# Patient Record
Sex: Female | Born: 1975
Health system: Southern US, Community
[De-identification: ages and names within clinical notes are randomized; demographics above are authoritative.]

## PROBLEM LIST (undated history)

## (undated) DIAGNOSIS — F419 Anxiety disorder, unspecified: Secondary | ICD-10-CM

## (undated) DIAGNOSIS — M9420 Chondromalacia, unspecified site: Secondary | ICD-10-CM

## (undated) DIAGNOSIS — L732 Hidradenitis suppurativa: Secondary | ICD-10-CM

## (undated) DIAGNOSIS — N949 Unspecified condition associated with female genital organs and menstrual cycle: Secondary | ICD-10-CM

## (undated) DIAGNOSIS — J449 Chronic obstructive pulmonary disease, unspecified: Secondary | ICD-10-CM

## (undated) DIAGNOSIS — G709 Myoneural disorder, unspecified: Secondary | ICD-10-CM

## (undated) DIAGNOSIS — E66811 Obesity, class 1: Secondary | ICD-10-CM

## (undated) DIAGNOSIS — R51 Headache: Secondary | ICD-10-CM

## (undated) DIAGNOSIS — D649 Anemia, unspecified: Secondary | ICD-10-CM

## (undated) DIAGNOSIS — E669 Obesity, unspecified: Secondary | ICD-10-CM

## (undated) DIAGNOSIS — I10 Essential (primary) hypertension: Secondary | ICD-10-CM

## (undated) DIAGNOSIS — K529 Noninfective gastroenteritis and colitis, unspecified: Secondary | ICD-10-CM

## (undated) DIAGNOSIS — L0591 Pilonidal cyst without abscess: Secondary | ICD-10-CM

## (undated) DIAGNOSIS — R112 Nausea with vomiting, unspecified: Secondary | ICD-10-CM

## (undated) DIAGNOSIS — L42 Pityriasis rosea: Secondary | ICD-10-CM

## (undated) HISTORY — DX: Myoneural disorder, unspecified: G70.9

## (undated) HISTORY — PX: PARTIAL HYSTERECTOMY: SHX80

## (undated) HISTORY — PX: FRACTURE SURGERY: SHX138

## (undated) HISTORY — PX: ABDOMINAL HYSTERECTOMY: SHX81

## (undated) HISTORY — PX: OVARIAN CYST REMOVAL: SHX89

## (undated) HISTORY — PX: OTHER SURGICAL HISTORY: SHX169

## (undated) HISTORY — DX: Chronic obstructive pulmonary disease, unspecified: J44.9

## (undated) HISTORY — DX: Anemia, unspecified: D64.9

## (undated) HISTORY — DX: Anxiety disorder, unspecified: F41.9

## (undated) HISTORY — PX: CYSTECTOMY: SUR359

---

## 1997-11-14 ENCOUNTER — Emergency Department (HOSPITAL_COMMUNITY): Admission: EM | Admit: 1997-11-14 | Discharge: 1997-11-14 | Payer: Self-pay | Admitting: Emergency Medicine

## 1997-11-22 ENCOUNTER — Emergency Department (HOSPITAL_COMMUNITY): Admission: EM | Admit: 1997-11-22 | Discharge: 1997-11-22 | Payer: Self-pay | Admitting: Emergency Medicine

## 1997-12-23 ENCOUNTER — Emergency Department (HOSPITAL_COMMUNITY): Admission: EM | Admit: 1997-12-23 | Discharge: 1997-12-23 | Payer: Self-pay | Admitting: Emergency Medicine

## 1998-02-03 ENCOUNTER — Emergency Department (HOSPITAL_COMMUNITY): Admission: EM | Admit: 1998-02-03 | Discharge: 1998-02-03 | Payer: Self-pay | Admitting: Emergency Medicine

## 1998-03-14 ENCOUNTER — Emergency Department (HOSPITAL_COMMUNITY): Admission: EM | Admit: 1998-03-14 | Discharge: 1998-03-14 | Payer: Self-pay | Admitting: Emergency Medicine

## 1998-03-14 ENCOUNTER — Encounter: Payer: Self-pay | Admitting: Emergency Medicine

## 1998-06-14 ENCOUNTER — Encounter: Payer: Self-pay | Admitting: Emergency Medicine

## 1998-06-14 ENCOUNTER — Emergency Department (HOSPITAL_COMMUNITY): Admission: EM | Admit: 1998-06-14 | Discharge: 1998-06-14 | Payer: Self-pay | Admitting: Emergency Medicine

## 1998-06-17 ENCOUNTER — Inpatient Hospital Stay (HOSPITAL_COMMUNITY): Admission: AD | Admit: 1998-06-17 | Discharge: 1998-06-17 | Payer: Self-pay | Admitting: *Deleted

## 1998-06-22 ENCOUNTER — Other Ambulatory Visit: Admission: RE | Admit: 1998-06-22 | Discharge: 1998-06-22 | Payer: Self-pay | Admitting: Obstetrics

## 1998-06-22 ENCOUNTER — Encounter: Admission: RE | Admit: 1998-06-22 | Discharge: 1998-06-22 | Payer: Self-pay | Admitting: Obstetrics

## 1998-07-03 ENCOUNTER — Inpatient Hospital Stay (HOSPITAL_COMMUNITY): Admission: AD | Admit: 1998-07-03 | Discharge: 1998-07-05 | Payer: Self-pay | Admitting: Obstetrics & Gynecology

## 1998-07-07 ENCOUNTER — Inpatient Hospital Stay (HOSPITAL_COMMUNITY): Admission: AD | Admit: 1998-07-07 | Discharge: 1998-07-07 | Payer: Self-pay | Admitting: Obstetrics & Gynecology

## 1998-07-11 ENCOUNTER — Encounter: Admission: RE | Admit: 1998-07-11 | Discharge: 1998-07-11 | Payer: Self-pay | Admitting: Obstetrics & Gynecology

## 1998-09-21 ENCOUNTER — Emergency Department (HOSPITAL_COMMUNITY): Admission: EM | Admit: 1998-09-21 | Discharge: 1998-09-21 | Payer: Self-pay | Admitting: Emergency Medicine

## 1998-09-25 ENCOUNTER — Emergency Department (HOSPITAL_COMMUNITY): Admission: EM | Admit: 1998-09-25 | Discharge: 1998-09-25 | Payer: Self-pay | Admitting: Emergency Medicine

## 1998-10-16 ENCOUNTER — Emergency Department (HOSPITAL_COMMUNITY): Admission: EM | Admit: 1998-10-16 | Discharge: 1998-10-16 | Payer: Self-pay

## 1998-10-19 ENCOUNTER — Inpatient Hospital Stay (HOSPITAL_COMMUNITY): Admission: AD | Admit: 1998-10-19 | Discharge: 1998-10-19 | Payer: Self-pay | Admitting: Obstetrics

## 1998-10-19 ENCOUNTER — Emergency Department (HOSPITAL_COMMUNITY): Admission: EM | Admit: 1998-10-19 | Discharge: 1998-10-19 | Payer: Self-pay | Admitting: Emergency Medicine

## 1998-11-13 ENCOUNTER — Encounter: Payer: Self-pay | Admitting: Emergency Medicine

## 1998-11-13 ENCOUNTER — Emergency Department (HOSPITAL_COMMUNITY): Admission: EM | Admit: 1998-11-13 | Discharge: 1998-11-13 | Payer: Self-pay | Admitting: Emergency Medicine

## 1998-11-27 ENCOUNTER — Emergency Department (HOSPITAL_COMMUNITY): Admission: EM | Admit: 1998-11-27 | Discharge: 1998-11-27 | Payer: Self-pay | Admitting: *Deleted

## 1999-01-24 ENCOUNTER — Inpatient Hospital Stay (HOSPITAL_COMMUNITY): Admission: AD | Admit: 1999-01-24 | Discharge: 1999-01-24 | Payer: Self-pay | Admitting: Obstetrics & Gynecology

## 1999-02-06 ENCOUNTER — Emergency Department (HOSPITAL_COMMUNITY): Admission: EM | Admit: 1999-02-06 | Discharge: 1999-02-06 | Payer: Self-pay | Admitting: *Deleted

## 1999-03-07 ENCOUNTER — Other Ambulatory Visit: Admission: RE | Admit: 1999-03-07 | Discharge: 1999-03-07 | Payer: Self-pay | Admitting: Obstetrics and Gynecology

## 1999-05-21 DIAGNOSIS — L732 Hidradenitis suppurativa: Secondary | ICD-10-CM

## 1999-05-21 HISTORY — DX: Hidradenitis suppurativa: L73.2

## 1999-08-29 ENCOUNTER — Inpatient Hospital Stay (HOSPITAL_COMMUNITY): Admission: AD | Admit: 1999-08-29 | Discharge: 1999-08-29 | Payer: Self-pay | Admitting: Obstetrics

## 1999-09-02 ENCOUNTER — Inpatient Hospital Stay (HOSPITAL_COMMUNITY): Admission: AD | Admit: 1999-09-02 | Discharge: 1999-09-03 | Payer: Self-pay | Admitting: Obstetrics

## 1999-09-05 ENCOUNTER — Inpatient Hospital Stay (HOSPITAL_COMMUNITY): Admission: AD | Admit: 1999-09-05 | Discharge: 1999-09-08 | Payer: Self-pay | Admitting: Obstetrics

## 1999-10-02 ENCOUNTER — Emergency Department (HOSPITAL_COMMUNITY): Admission: EM | Admit: 1999-10-02 | Discharge: 1999-10-02 | Payer: Self-pay | Admitting: Emergency Medicine

## 1999-10-18 ENCOUNTER — Encounter (INDEPENDENT_AMBULATORY_CARE_PROVIDER_SITE_OTHER): Payer: Self-pay | Admitting: *Deleted

## 1999-10-18 ENCOUNTER — Ambulatory Visit (HOSPITAL_BASED_OUTPATIENT_CLINIC_OR_DEPARTMENT_OTHER): Admission: RE | Admit: 1999-10-18 | Discharge: 1999-10-19 | Payer: Self-pay | Admitting: General Surgery

## 2000-05-20 HISTORY — PX: LAPAROSCOPIC CHOLECYSTECTOMY: SUR755

## 2000-08-04 ENCOUNTER — Inpatient Hospital Stay (HOSPITAL_COMMUNITY): Admission: AD | Admit: 2000-08-04 | Discharge: 2000-08-06 | Payer: Self-pay | Admitting: Obstetrics & Gynecology

## 2000-11-16 ENCOUNTER — Inpatient Hospital Stay (HOSPITAL_COMMUNITY): Admission: EM | Admit: 2000-11-16 | Discharge: 2000-11-17 | Payer: Self-pay | Admitting: Emergency Medicine

## 2000-11-16 ENCOUNTER — Encounter (INDEPENDENT_AMBULATORY_CARE_PROVIDER_SITE_OTHER): Payer: Self-pay

## 2000-11-16 ENCOUNTER — Encounter: Payer: Self-pay | Admitting: Emergency Medicine

## 2001-09-15 ENCOUNTER — Encounter (INDEPENDENT_AMBULATORY_CARE_PROVIDER_SITE_OTHER): Payer: Self-pay | Admitting: *Deleted

## 2001-09-15 ENCOUNTER — Ambulatory Visit (HOSPITAL_BASED_OUTPATIENT_CLINIC_OR_DEPARTMENT_OTHER): Admission: RE | Admit: 2001-09-15 | Discharge: 2001-09-15 | Payer: Self-pay

## 2002-07-12 ENCOUNTER — Encounter: Admission: RE | Admit: 2002-07-12 | Discharge: 2002-07-12 | Payer: Self-pay | Admitting: Family Medicine

## 2002-07-12 ENCOUNTER — Encounter: Payer: Self-pay | Admitting: Family Medicine

## 2002-08-30 ENCOUNTER — Ambulatory Visit (HOSPITAL_COMMUNITY): Admission: RE | Admit: 2002-08-30 | Discharge: 2002-08-30 | Payer: Self-pay | Admitting: General Surgery

## 2002-08-30 ENCOUNTER — Encounter (INDEPENDENT_AMBULATORY_CARE_PROVIDER_SITE_OTHER): Payer: Self-pay | Admitting: Specialist

## 2002-10-02 ENCOUNTER — Encounter: Payer: Self-pay | Admitting: Emergency Medicine

## 2002-10-02 ENCOUNTER — Emergency Department (HOSPITAL_COMMUNITY): Admission: EM | Admit: 2002-10-02 | Discharge: 2002-10-02 | Payer: Self-pay | Admitting: Emergency Medicine

## 2002-10-03 ENCOUNTER — Inpatient Hospital Stay (HOSPITAL_COMMUNITY): Admission: AD | Admit: 2002-10-03 | Discharge: 2002-10-03 | Payer: Self-pay | Admitting: Obstetrics and Gynecology

## 2002-10-05 ENCOUNTER — Inpatient Hospital Stay (HOSPITAL_COMMUNITY): Admission: AD | Admit: 2002-10-05 | Discharge: 2002-10-05 | Payer: Self-pay | Admitting: *Deleted

## 2002-12-15 ENCOUNTER — Encounter: Payer: Self-pay | Admitting: Emergency Medicine

## 2002-12-15 ENCOUNTER — Emergency Department (HOSPITAL_COMMUNITY): Admission: EM | Admit: 2002-12-15 | Discharge: 2002-12-15 | Payer: Self-pay | Admitting: Emergency Medicine

## 2003-05-21 DIAGNOSIS — M9420 Chondromalacia, unspecified site: Secondary | ICD-10-CM

## 2003-05-21 HISTORY — DX: Chondromalacia, unspecified site: M94.20

## 2003-05-21 HISTORY — PX: KNEE SURGERY: SHX244

## 2003-07-15 ENCOUNTER — Ambulatory Visit (HOSPITAL_COMMUNITY): Admission: RE | Admit: 2003-07-15 | Discharge: 2003-07-15 | Payer: Self-pay | Admitting: Orthopedic Surgery

## 2003-07-26 ENCOUNTER — Inpatient Hospital Stay (HOSPITAL_COMMUNITY): Admission: EM | Admit: 2003-07-26 | Discharge: 2003-07-27 | Payer: Self-pay | Admitting: Emergency Medicine

## 2003-08-15 ENCOUNTER — Encounter: Admission: RE | Admit: 2003-08-15 | Discharge: 2003-11-13 | Payer: Self-pay | Admitting: Orthopedic Surgery

## 2003-10-25 ENCOUNTER — Ambulatory Visit (HOSPITAL_COMMUNITY): Admission: RE | Admit: 2003-10-25 | Discharge: 2003-10-26 | Payer: Self-pay | Admitting: Orthopedic Surgery

## 2004-07-14 ENCOUNTER — Emergency Department (HOSPITAL_COMMUNITY): Admission: EM | Admit: 2004-07-14 | Discharge: 2004-07-14 | Payer: Self-pay | Admitting: Emergency Medicine

## 2004-10-09 ENCOUNTER — Other Ambulatory Visit: Admission: RE | Admit: 2004-10-09 | Discharge: 2004-10-09 | Payer: Self-pay | Admitting: Obstetrics and Gynecology

## 2005-04-04 ENCOUNTER — Encounter: Admission: RE | Admit: 2005-04-04 | Discharge: 2005-04-04 | Payer: Self-pay | Admitting: Family Medicine

## 2005-04-10 ENCOUNTER — Encounter: Admission: RE | Admit: 2005-04-10 | Discharge: 2005-04-10 | Payer: Self-pay | Admitting: Family Medicine

## 2005-04-26 ENCOUNTER — Emergency Department (HOSPITAL_COMMUNITY): Admission: EM | Admit: 2005-04-26 | Discharge: 2005-04-26 | Payer: Self-pay | Admitting: Emergency Medicine

## 2005-05-04 ENCOUNTER — Encounter: Admission: RE | Admit: 2005-05-04 | Discharge: 2005-05-04 | Payer: Self-pay | Admitting: Ophthalmology

## 2005-05-05 ENCOUNTER — Encounter: Admission: RE | Admit: 2005-05-05 | Discharge: 2005-05-05 | Payer: Self-pay | Admitting: Neurology

## 2005-07-02 ENCOUNTER — Encounter: Admission: RE | Admit: 2005-07-02 | Discharge: 2005-07-02 | Payer: Self-pay | Admitting: Family Medicine

## 2005-07-02 ENCOUNTER — Ambulatory Visit (HOSPITAL_COMMUNITY): Admission: RE | Admit: 2005-07-02 | Discharge: 2005-07-02 | Payer: Self-pay | Admitting: *Deleted

## 2005-08-09 ENCOUNTER — Emergency Department (HOSPITAL_COMMUNITY): Admission: EM | Admit: 2005-08-09 | Discharge: 2005-08-09 | Payer: Self-pay | Admitting: Emergency Medicine

## 2006-04-29 ENCOUNTER — Encounter: Admission: RE | Admit: 2006-04-29 | Discharge: 2006-04-29 | Payer: Self-pay | Admitting: Neurosurgery

## 2006-06-06 ENCOUNTER — Encounter: Admission: RE | Admit: 2006-06-06 | Discharge: 2006-06-06 | Payer: Self-pay | Admitting: Neurosurgery

## 2007-01-24 ENCOUNTER — Emergency Department (HOSPITAL_COMMUNITY): Admission: EM | Admit: 2007-01-24 | Discharge: 2007-01-24 | Payer: Self-pay | Admitting: Emergency Medicine

## 2007-07-06 ENCOUNTER — Emergency Department (HOSPITAL_COMMUNITY): Admission: EM | Admit: 2007-07-06 | Discharge: 2007-07-06 | Payer: Self-pay | Admitting: Emergency Medicine

## 2007-10-09 ENCOUNTER — Emergency Department (HOSPITAL_COMMUNITY): Admission: EM | Admit: 2007-10-09 | Discharge: 2007-10-09 | Payer: Self-pay | Admitting: Emergency Medicine

## 2008-02-02 ENCOUNTER — Emergency Department (HOSPITAL_COMMUNITY): Admission: EM | Admit: 2008-02-02 | Discharge: 2008-02-03 | Payer: Self-pay | Admitting: Emergency Medicine

## 2008-03-16 ENCOUNTER — Emergency Department (HOSPITAL_COMMUNITY): Admission: EM | Admit: 2008-03-16 | Discharge: 2008-03-16 | Payer: Self-pay | Admitting: Emergency Medicine

## 2008-05-04 ENCOUNTER — Emergency Department (HOSPITAL_COMMUNITY): Admission: EM | Admit: 2008-05-04 | Discharge: 2008-05-04 | Payer: Self-pay | Admitting: Emergency Medicine

## 2008-11-30 ENCOUNTER — Emergency Department (HOSPITAL_COMMUNITY): Admission: EM | Admit: 2008-11-30 | Discharge: 2008-11-30 | Payer: Self-pay | Admitting: Emergency Medicine

## 2009-01-17 ENCOUNTER — Emergency Department (HOSPITAL_COMMUNITY): Admission: EM | Admit: 2009-01-17 | Discharge: 2009-01-18 | Payer: Self-pay | Admitting: Emergency Medicine

## 2009-03-23 ENCOUNTER — Emergency Department (HOSPITAL_COMMUNITY): Admission: EM | Admit: 2009-03-23 | Discharge: 2009-03-23 | Payer: Self-pay | Admitting: Emergency Medicine

## 2009-04-03 ENCOUNTER — Ambulatory Visit: Payer: Self-pay | Admitting: Internal Medicine

## 2009-04-25 ENCOUNTER — Emergency Department (HOSPITAL_COMMUNITY): Admission: EM | Admit: 2009-04-25 | Discharge: 2009-04-25 | Payer: Self-pay | Admitting: Emergency Medicine

## 2009-05-20 HISTORY — PX: GASTRIC BYPASS: SHX52

## 2009-07-27 ENCOUNTER — Emergency Department (HOSPITAL_COMMUNITY): Admission: EM | Admit: 2009-07-27 | Discharge: 2009-07-27 | Payer: Self-pay | Admitting: Family Medicine

## 2009-08-31 ENCOUNTER — Emergency Department (HOSPITAL_COMMUNITY): Admission: EM | Admit: 2009-08-31 | Discharge: 2009-09-01 | Payer: Self-pay | Admitting: Emergency Medicine

## 2009-09-01 ENCOUNTER — Emergency Department (HOSPITAL_COMMUNITY): Admission: EM | Admit: 2009-09-01 | Discharge: 2009-09-01 | Payer: Self-pay | Admitting: Emergency Medicine

## 2009-10-17 ENCOUNTER — Emergency Department (HOSPITAL_COMMUNITY): Admission: EM | Admit: 2009-10-17 | Discharge: 2009-10-17 | Payer: Self-pay | Admitting: Emergency Medicine

## 2010-08-06 LAB — COMPREHENSIVE METABOLIC PANEL
BUN: 4 mg/dL — ABNORMAL LOW (ref 6–23)
Calcium: 9 mg/dL (ref 8.4–10.5)
Glucose, Bld: 86 mg/dL (ref 70–99)
Sodium: 144 mEq/L (ref 135–145)
Total Protein: 7.1 g/dL (ref 6.0–8.3)

## 2010-08-06 LAB — URINALYSIS, ROUTINE W REFLEX MICROSCOPIC
Hgb urine dipstick: NEGATIVE
Ketones, ur: 15 mg/dL — AB
Protein, ur: 30 mg/dL — AB
Specific Gravity, Urine: 1.037 — ABNORMAL HIGH (ref 1.005–1.030)
Urobilinogen, UA: 1 mg/dL (ref 0.0–1.0)

## 2010-08-06 LAB — DIFFERENTIAL
Lymphocytes Relative: 29 % (ref 12–46)
Lymphs Abs: 2.3 10*3/uL (ref 0.7–4.0)
Monocytes Relative: 5 % (ref 3–12)
Neutro Abs: 5.1 10*3/uL (ref 1.7–7.7)
Neutrophils Relative %: 65 % (ref 43–77)

## 2010-08-06 LAB — CBC
HCT: 46.1 % — ABNORMAL HIGH (ref 36.0–46.0)
Hemoglobin: 15.2 g/dL — ABNORMAL HIGH (ref 12.0–15.0)
MCHC: 32.9 g/dL (ref 30.0–36.0)
RDW: 14.2 % (ref 11.5–15.5)

## 2010-08-06 LAB — URINE CULTURE: Culture: NO GROWTH

## 2010-08-06 LAB — URINE MICROSCOPIC-ADD ON

## 2010-08-07 LAB — WOUND CULTURE
Culture: NO GROWTH
Gram Stain: NONE SEEN

## 2010-08-08 LAB — CBC
Platelets: 324 10*3/uL (ref 150–400)
RDW: 13.3 % (ref 11.5–15.5)

## 2010-08-08 LAB — DIFFERENTIAL
Basophils Relative: 0 % (ref 0–1)
Lymphs Abs: 2.4 10*3/uL (ref 0.7–4.0)
Monocytes Absolute: 0.5 10*3/uL (ref 0.1–1.0)
Monocytes Relative: 6 % (ref 3–12)
Neutro Abs: 6 10*3/uL (ref 1.7–7.7)

## 2010-08-08 LAB — COMPREHENSIVE METABOLIC PANEL
ALT: 34 U/L (ref 0–35)
Albumin: 3.2 g/dL — ABNORMAL LOW (ref 3.5–5.2)
Alkaline Phosphatase: 93 U/L (ref 39–117)
BUN: 7 mg/dL (ref 6–23)
Calcium: 8.7 mg/dL (ref 8.4–10.5)
Potassium: 3.3 mEq/L — ABNORMAL LOW (ref 3.5–5.1)
Sodium: 141 mEq/L (ref 135–145)
Total Protein: 6.8 g/dL (ref 6.0–8.3)

## 2010-08-21 LAB — POCT URINALYSIS DIP (DEVICE)
Bilirubin Urine: NEGATIVE
Nitrite: NEGATIVE
Specific Gravity, Urine: 1.015 (ref 1.005–1.030)
pH: 7 (ref 5.0–8.0)

## 2010-08-22 LAB — CBC
HCT: 45 % (ref 36.0–46.0)
Hemoglobin: 15.5 g/dL — ABNORMAL HIGH (ref 12.0–15.0)
MCHC: 34.4 g/dL (ref 30.0–36.0)
MCV: 95.4 fL (ref 78.0–100.0)
RBC: 4.72 MIL/uL (ref 3.87–5.11)
RDW: 13.6 % (ref 11.5–15.5)

## 2010-08-22 LAB — POCT I-STAT, CHEM 8
Calcium, Ion: 1.09 mmol/L — ABNORMAL LOW (ref 1.12–1.32)
Creatinine, Ser: 0.7 mg/dL (ref 0.4–1.2)
Glucose, Bld: 92 mg/dL (ref 70–99)
HCT: 49 % — ABNORMAL HIGH (ref 36.0–46.0)
Hemoglobin: 16.7 g/dL — ABNORMAL HIGH (ref 12.0–15.0)

## 2010-08-22 LAB — DIFFERENTIAL
Basophils Absolute: 0.1 10*3/uL (ref 0.0–0.1)
Basophils Relative: 2 % — ABNORMAL HIGH (ref 0–1)
Eosinophils Absolute: 0.1 10*3/uL (ref 0.0–0.7)
Eosinophils Relative: 2 % (ref 0–5)
Monocytes Absolute: 0.3 10*3/uL (ref 0.1–1.0)
Monocytes Relative: 4 % (ref 3–12)

## 2010-08-22 LAB — POCT CARDIAC MARKERS
CKMB, poc: <1 ng/mL — ABNORMAL LOW (ref 1.0–8.0)
CKMB, poc: <1 ng/mL — ABNORMAL LOW (ref 1.0–8.0)
Myoglobin, poc: 35.2 ng/mL (ref 12–200)
Troponin i, poc: <0.05 ng/mL (ref 0.00–0.09)

## 2010-08-24 LAB — POCT CARDIAC MARKERS: Myoglobin, poc: 59.1 ng/mL (ref 12–200)

## 2010-08-26 LAB — POCT CARDIAC MARKERS
CKMB, poc: <1 ng/mL — ABNORMAL LOW (ref 1.0–8.0)
Myoglobin, poc: 72.9 ng/mL (ref 12–200)
Troponin i, poc: <0.05 ng/mL (ref 0.00–0.09)

## 2010-08-26 LAB — COMPREHENSIVE METABOLIC PANEL
ALT: 34 U/L (ref 0–35)
BUN: 6 mg/dL (ref 6–23)
Calcium: 8.9 mg/dL (ref 8.4–10.5)
Creatinine, Ser: 0.81 mg/dL (ref 0.4–1.2)
Glucose, Bld: 93 mg/dL (ref 70–99)
Sodium: 142 mEq/L (ref 135–145)
Total Protein: 6.3 g/dL (ref 6.0–8.3)

## 2010-08-26 LAB — DIFFERENTIAL
Lymphocytes Relative: 42 % (ref 12–46)
Lymphs Abs: 2.5 10*3/uL (ref 0.7–4.0)
Monocytes Relative: 4 % (ref 3–12)
Neutro Abs: 3 10*3/uL (ref 1.7–7.7)
Neutrophils Relative %: 51 % (ref 43–77)

## 2010-08-26 LAB — CBC
Hemoglobin: 14.9 g/dL (ref 12.0–15.0)
MCHC: 33.7 g/dL (ref 30.0–36.0)
RDW: 13.1 % (ref 11.5–15.5)

## 2010-09-27 ENCOUNTER — Emergency Department (HOSPITAL_BASED_OUTPATIENT_CLINIC_OR_DEPARTMENT_OTHER)
Admission: EM | Admit: 2010-09-27 | Discharge: 2010-09-27 | Disposition: A | Discharge status: Home / Self Care | Payer: Medicaid Other | Attending: Emergency Medicine | Admitting: Emergency Medicine

## 2010-09-27 DIAGNOSIS — L0231 Cutaneous abscess of buttock: Secondary | ICD-10-CM | POA: Insufficient documentation

## 2010-09-27 DIAGNOSIS — L03317 Cellulitis of buttock: Secondary | ICD-10-CM | POA: Insufficient documentation

## 2010-09-27 DIAGNOSIS — I10 Essential (primary) hypertension: Secondary | ICD-10-CM | POA: Insufficient documentation

## 2010-09-29 ENCOUNTER — Emergency Department (HOSPITAL_BASED_OUTPATIENT_CLINIC_OR_DEPARTMENT_OTHER)
Admission: EM | Admit: 2010-09-29 | Discharge: 2010-09-29 | Disposition: A | Discharge status: Home / Self Care | Payer: Medicaid Other | Attending: Emergency Medicine | Admitting: Emergency Medicine

## 2010-09-29 ENCOUNTER — Emergency Department (INDEPENDENT_AMBULATORY_CARE_PROVIDER_SITE_OTHER): Payer: Medicaid Other

## 2010-09-29 ENCOUNTER — Emergency Department (HOSPITAL_BASED_OUTPATIENT_CLINIC_OR_DEPARTMENT_OTHER): Payer: Medicaid Other

## 2010-09-29 DIAGNOSIS — L0231 Cutaneous abscess of buttock: Secondary | ICD-10-CM

## 2010-09-29 DIAGNOSIS — I10 Essential (primary) hypertension: Secondary | ICD-10-CM | POA: Insufficient documentation

## 2010-09-29 DIAGNOSIS — Z79899 Other long term (current) drug therapy: Secondary | ICD-10-CM | POA: Insufficient documentation

## 2010-09-29 DIAGNOSIS — L03317 Cellulitis of buttock: Secondary | ICD-10-CM

## 2010-09-29 LAB — CBC
HCT: 39.8 % (ref 36.0–46.0)
Hemoglobin: 13.6 g/dL (ref 12.0–15.0)
MCHC: 34.2 g/dL (ref 30.0–36.0)
MCV: 95 fL (ref 78.0–100.0)
RDW: 12.9 % (ref 11.5–15.5)
WBC: 5.8 10*3/uL (ref 4.0–10.5)

## 2010-09-29 LAB — DIFFERENTIAL
Eosinophils Relative: 2 % (ref 0–5)
Lymphocytes Relative: 33 % (ref 12–46)
Lymphs Abs: 1.9 10*3/uL (ref 0.7–4.0)
Monocytes Absolute: 0.4 10*3/uL (ref 0.1–1.0)
Neutro Abs: 3.4 10*3/uL (ref 1.7–7.7)

## 2010-09-29 LAB — BASIC METABOLIC PANEL
BUN: 8 mg/dL (ref 6–23)
CO2: 26 mEq/L (ref 19–32)
GFR calc non Af Amer: >60 mL/min (ref >60)
Glucose, Bld: 107 mg/dL — ABNORMAL HIGH (ref 70–99)
Potassium: 3.4 mEq/L — ABNORMAL LOW (ref 3.5–5.1)

## 2010-09-29 MED ORDER — IOHEXOL 300 MG/ML  SOLN
100.0000 mL | Freq: Once | INTRAMUSCULAR | Status: AC | PRN
  Administered 2010-09-29: 100 mL via INTRAVENOUS

## 2010-09-30 ENCOUNTER — Emergency Department (HOSPITAL_BASED_OUTPATIENT_CLINIC_OR_DEPARTMENT_OTHER)
Admission: EM | Admit: 2010-09-30 | Discharge: 2010-09-30 | Disposition: A | Discharge status: Home / Self Care | Payer: Medicaid Other | Attending: Emergency Medicine | Admitting: Emergency Medicine

## 2010-09-30 DIAGNOSIS — Z09 Encounter for follow-up examination after completed treatment for conditions other than malignant neoplasm: Secondary | ICD-10-CM | POA: Insufficient documentation

## 2010-09-30 DIAGNOSIS — F17200 Nicotine dependence, unspecified, uncomplicated: Secondary | ICD-10-CM | POA: Insufficient documentation

## 2010-09-30 DIAGNOSIS — I10 Essential (primary) hypertension: Secondary | ICD-10-CM | POA: Insufficient documentation

## 2010-10-01 LAB — CULTURE, ROUTINE-ABSCESS: Gram Stain: NONE SEEN

## 2010-10-05 NOTE — Op Note (Signed)
[place]. [facility]  Patient:    [name], [name]                       MRN: [id number] Proc. Date: 10/18/99 Adm. Date:  [id number] Disc. Date: [id number] Attending:  Henrene Dodge CC:         Anselm Pancoast. Zachery Dakins, M.D.                           Operative Report  PREOPERATIVE DIAGNOSES: 1. Recurrent pilonidal abscess/cyst. 2. Multiple abscesses, hidradenitis, both axilla.  POSTOPERATIVE DIAGNOSES: 1. Recurrent pilonidal abscess/cyst. 2. Multiple abscesses, hidradenitis, both axilla.  PROCEDURE: 1. Excision of pilonidal with closure. 2. Excision of small area of hidradenitis, right axilla. 3. Multiple areas of hidradenitis excised, left axilla.  ANESTHESIA:  General  SURGEON:  Anselm Pancoast. Zachery Dakins, M.D.  HISTORY:  The patient is a 35 year old quite large black female whom I first met approximately 3 months ago when she was pregnant.  She had the largest pilonidal cyst that I have ever drained.  This was drained with local anesthesia, and she  went onto deliver approximately a month later.  I next saw her approximately a month later, when she presented to the Emergency Room at [facility] with a large abscess in the right axilla. This was drained with local anesthesia.  She said t had been drained approximately 10 times previously and I saw her back in the office approximately a week ago, and scheduled for excision of the pilonidal cyst, reexcision of drainage of this area in the right axilla. She has multiple, somewhat smaller abscesses in the left axilla at this time.  We are planning to hopefully excise the pilonidal, excise this reoccurrent abscess in the right axilla and then sort of drain/excisional debridement of the smaller multiple abscesses in the left axilla.  DESCRIPTION OF PROCEDURE:  Prone position, switched into a supine position.  The patient was taken to the operative suite, given a g of Kefzol after induction of  general anesthesia and then switched over a prone position.  Arms and chest padded.  After a good airway, and she was comfortable, we then taped the buttocks apart and then prepped the area with Betadine surgical scrub and solution and draped her in a sterile manner.  The pilonidal area excised was probably 6-7 inches in length, developing the superior, inferolateral flaps and then the whole scar  complex cyst area completely excised. She is so large that it was easy to kind f undermine the buttocks so that you could pull it together with 3-0 Vicryl. I put a 10 mm Jackson-Pratt drain in the base of the wound. The skin itself was closed ith a combination of 2 and 3-0 nylon simple sutures, and mattress sutures.  Next, after sterile occlusive dressing had been applied, we switched her over onto a second OR table in a supine position and then the area was completely redraped sterilely, new drape, gloves, etc (everything.)  Then this area of the right, which the skin incision was about 4 cm in size, excised in an egg-sized chronic abscess. Good hemostasis was obtained with cautery and 3-0 chromic, and then this skin incision was closed with simple sutures of 3-0 nylon.  Next on the left axilla,  which there was about 4 abscesses (each about a nickel to a dime to a quarter in size), and I excised the abscesses and surrounding cyst contents through  multiple incisions, and then the incisions were loosely closed with 3-0 nylon and then packed with Iodoform gauze.  The patient tolerated the procedure.  Estimated blood loss totally was probably 100 cc.  She was extubated and taken to the recovery oom in a stable, postoperative condition. She will be released after a stay in the recovery room.  Hopefully, her husband will be helping her, as she will be rather limited over the next few days. I will see her back in the office early next week for wound check.  She was instructed to take the  packing out of the left axilla  probably Saturday evening. DD:  10/18/99 TD:  10/22/99 Job: [id number]
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
## 2010-10-05 NOTE — Op Note (Signed)
NAME:  [name], Erin A                        ACCOUNT NO.:  [id number]   MEDICAL RECORD NO.:  [id number]                   PATIENT TYPE:  AMB   LOCATION:  DAY                                  FACILITY:  [facility]   PHYSICIAN:  Anselm Pancoast. Zachery Dakins, M.D.          DATE OF BIRTH:  [birth year]   DATE OF PROCEDURE:  08/30/2002  DATE OF DISCHARGE:                                 OPERATIVE REPORT   PREOPERATIVE DIAGNOSIS:  Recurrent hidradenitis, left axilla.   OPERATION:  Excision of recurrent hidradenitis, left axilla.   ANESTHESIA:  General.   SURGEON:  Anselm Pancoast. Zachery Dakins, M.D.   HISTORY:  [name] is a 35 year old, overweight female, who has had a  history of hidradenitis.  I have operated on her numerous times with  hidradenitis in the axilla, sacral, and other areas.  She has had kind of a  nearly complete excision of the hidroa area in the right and has no further  problems on the left.  We just drained and ellipsed out the smaller areas,  and she has had recurrent problems with the left axilla, and she desires for  it to be managed as we did the right.  There is a large area of drainage.  I  saw her in the office last week, but she would not let me drain the area  because of the pain, and she is on Keflex.  She is not febrile, and her lab  studies, hemoglobin and hematocrit are normal.   DESCRIPTION OF PROCEDURE:  She was given 1 g of Kefzol intravenously, taken  to the operative suite, positioned on the OR table so that her breast was  kind of pulled to the right and inferior to expose the axilla easier, and  then the area was prepped with Betadine surgical scrub and solution and  draped in a sterile manner.  I made an ellipse of skin about 5 inches x  about 2.5 inches that encompasses all of the previously drained and the  large inflammatory mass, and then the medial flap was developed first.  The  skin edges were developed with skin hooks, and the underlying bleeders  and  lymphatics were clamped and ligated with Vicryl or lightly coagulated.  The  lateral arm area was next developed, and then the axillary contents.  This  was all superficial and not truly below the pectoral fascia, was basically  clamped with hemostats and then ligated.  The wound was carefully inspected.  At no time did we open into any frank pockets of purulence, and I then place  a 10 Jackson-Pratt drain into the axilla, and this was sutured to the skin  with 3-0 nylon.  Next, the subcutaneous wounds were closed with 4-0 Vicryl,  and then the skin was closed with mattress sutures of 3-0 nylon with  automatic staples.  The patient tolerated the procedure nicely, and the  occlusive dressing was applied, first  with Vaseline gauze and then Vaseline  gauze around the drain.  The dressing was held in place with Hypafix, and  she will be released after a short stay in the recovery room.                                               Anselm Pancoast. Zachery Dakins, M.D.    WJW/MEDQ  D:  08/30/2002  T:  08/30/2002  Job:  [id number]

## 2010-10-05 NOTE — Op Note (Signed)
[place]. [facility]  Patient:    [name], [name] Visit Number: [id number] MRN: [id number]          Service Type: DSU Location: [facility] Attending Physician:  Henrene Dodge Dictated by:   Anselm Pancoast. Zachery Dakins, M.D. Proc. Date: 09/15/01 Admit Date:  09/15/2001 Discharge Date: 09/15/2001                             Operative Report  PREOPERATIVE DIAGNOSIS:  Recurrent chronic and acute hidradenitis of right axilla.  POSTOPERATIVE DIAGNOSIS:  Recurrent chronic and acute hidradenitis of right axilla.  OPERATION:  Excision and closure with drainage hidradenitis, right axilla, large superficial axillary dissection.  SURGEON:  Anselm Pancoast. Zachery Dakins, M.D.  INDICATIONS:  The patient is a 35 year old black female about 300+ pounds who I first met approximately four years ago when she presented with a large abscess of the right axilla secondary to hidradenitis.  This was drained and excised.  She has had a large pilonidal hidradenitis on the left axilla and has done well with the exception of the right axilla that she had intermittent pain and swelling, and has been on antibiotics now for approximately three weeks and I saw her yesterday.  She had a low grade temperature of a large big tender area, but was not really localized so it could be drained superficially in the office and I recommended that we excise this in the chronic areas in the operating room and hopefully could close it or pack it.  The patient was in agreement and she continues on the p.o. Keflex.  On examination this morning, there is an area about 4 x 6 inches in size that is just very tender, just a large mass in the chronic subcutaneous for a couple little areas that looked as if it possibly a good point, and we gave her 1 g of Kefzol, and took her to the operating room.  DESCRIPTION OF PROCEDURE:  Induction of general anesthesia with endotracheal tube.  The axilla was then prepped with  Betadine surgical scrubbing solution after shaving the axilla.  I marked a skin edge so that the chronic area where it had been at previously excised inferiorly would be excised within the skin area and then there was a larger area kind of down at the top part of the arm. I did use an 18-gauge needle and aspirated two areas and entered a pocket of chronic infection of which I removed probably 5 cc or 6 cc of frank pus that was sent for culture.  Next, after this ellipse of skin was marked, and the area excised was probably about 5 inches in length, about three or more inches wide so that nearly most of the hair-bearing area of the axilla will be excised.  I then developed the lateral flap first, elevating this first with double hooks and then the breast hook, and then afterward we were going around all of the acute infected superiorly and inferiorly, all in a similar manner.  Several little bleeders were encountered and required suturing with 4-0 Vicryl.  Most of them were just small enough they could be cauterized, and then after the area was freed circumferentially, sort of pulled it out of the axilla, and then along the superficial Scarpas fascia area and just basically removed everything.  It only went one area kind of deeper into the axilla so that all the gross inflammatory masses and all were excised, and  then this large specimen was sent for permanent examination.  I had to suture several little veins that were deeper, but these were all small, and did not actually open into the true deep axilla, and since there was no frank pus in the field, I think that we can close it with a Blake drain.  A 19 Blake drain was placed and brought out laterally and inferiorly, and then this sutured to the skin with 2-0 and then I closed the subcutaneous tissue with interrupted 4-0 Vicryl, and then closed it with staples.  We were able to put Vaseline gauze over the actual skin and then could get a  good suction seal after we had closed the actual skin incision with wide staples.  I will continue her on the Keflex and await the results of the culture, and plan on seeing her in the office on Thursday afternoon.  Hopefully, we will just be able to continue with the suction drainage and not have to open any areas. Dictated by:   Anselm Pancoast. Zachery Dakins, M.D. Attending Physician:  Henrene Dodge DD:  09/15/01 TD:  09/15/01 Job: [id number]

## 2010-10-05 NOTE — Op Note (Signed)
NAME:  SYRINA, WAKE                        ACCOUNT NO.:  [id number]   MEDICAL RECORD NO.:  [id number]                   PATIENT TYPE:  INP   LOCATION:  1610                                 FACILITY:  [facility]   PHYSICIAN:  Burnard Bunting, M.D.                 DATE OF BIRTH:  1975-09-12   DATE OF PROCEDURE:  07/26/2003  DATE OF DISCHARGE:  07/27/2003                                 OPERATIVE REPORT   PREOPERATIVE DIAGNOSIS:  Right forearm radial shaft fracture.   POSTOPERATIVE DIAGNOSIS:  Right forearm radial shaft fracture.   OPERATION/PROCEDURE:  Right forearm radial shaft fracture open reduction and  internal fixation.   SURGEON:  Burnard Bunting, M.D.   ANESTHESIA:  General endotracheal anesthesia.   ESTIMATED BLOOD LOSS:  10 mL.   TOURNIQUET TIME:  One hour and five minutes at 250 mmHg.   DESCRIPTION OF PROCEDURE:  The patient was brought to the operating room  where general endotracheal anesthesia was induced, preoperative antibiotics  administered.  The right arm was prepped with DuraPrep solution and draped  in a sterile manner.  Arm was elevated and exsanguinated with an Esmarch  wrap.  Tourniquet was inflated.  Lower Henry approach to the forearm was  made.  Skin and subcutaneous tissue were sharply divided.  The FCR tendon  sheath was incised along the length of the incision.  The posterior portion  of the sheath was then incised after the tendon was retracted radial-ward.  Blunt dissection was performed between the FCR and finger flexors distally  and between the FCR and pronator proximally.  The radial shaft was  encountered and was incised along its radial border.  Fracture site was  identified.  Fracture was then manually reduced.  A seven-hole, 3/5 LCDCP  plate was then placed in the standard AO fashion, mimicking the curve of the  radius.  Decompression and anatomic reduction was achieved.  Small butterfly  fragment was maintained in position by using #2 Ethibond  suture placed  around the radial shaft and tied.  Reduction was confirmed in the AP and  lateral planes under fluoroscopy.  The forearm had full pronation and  supination with no instability of this radial ulnar joint upon both  fluoroscopic and physical examination.  At this time the tourniquet was  released.  Bleeding points were encountered and controlled using bipolar  electrocautery.  Radial pulses palpable.  Incision was thoroughly irrigated  and then closed using interrupted inverted 2-0 Vicryl suture followed by  running 3-0 pull-out Prolene.  The patient tolerated the procedure well  without complications.                                               Burnard Bunting, M.D.    GSD/MEDQ  D:  07/31/2003  T:  07/31/2003  Job:  [id number]

## 2010-10-05 NOTE — Op Note (Signed)
NAME:  [name], [name]                        ACCOUNT NO.:  [id number]   MEDICAL RECORD NO.:  [id number]                   PATIENT TYPE:  OIB   LOCATION:  6709                                 FACILITY:  [facility]   PHYSICIAN:  Burnard Bunting, M.D.                 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   PROCEDURE:  Left knee diagnostic and operative arthroscopy with trochlear  chondroplasty, lateral release, and medial plication.   SURGEON:  Burnard Bunting, M.D.   ASSISTANT:  Jerolyn Shin. Tresa Res, M.D.   ANESTHESIA:  General endotracheal.   ESTIMATED BLOOD LOSS:  30 mL.   DRAINS:  None.   DESCRIPTION OF PROCEDURE:  The patient was brought to the operating room  where general endotracheal anesthesia was induced.  Preoperative IV  antibiotics were administered.  The left leg was prepped with Duraprep  solution and draped in the usual sterile fashion.  The tourniquet was  inflated.  Diagnostic arthroscopy was performed and the patient was noted to  have intact medial and lateral compartments.  She did have some trochlear  chondromalacia which was debrided with chondroplasty.  The patient had  abnormal patellar tilting and the tracking was observed.  Lateral  arthroscopic and lateral retinacular release was performed from the  inferolateral portal to [name] point 2 cm proximal to the superolateral margin of  the patella.  At this time, the instruments were removed from the portals.  Good medial patellar mobility had been achieved.  Under examination under  anesthesia, the patient had easily dislocatable patellar tendon with lateral  pressure.  Medial restraints were absent.  At this time, an incision was  made on the medial aspect of the knee.  Skin and subcutaneous tissue were  sharply divided.  As  the left patella was [name] clock face, tissue was detached  from the 7 to 11 o'clock position.  Medial patellofemoral ligament remnant  was identified.  One suture anchor was placed at the isometric point on the  medial epicondyle.  [name] three-layer dissection was performed in order to  separate capsule from the medial patellofemoral ligament and the medial  patellofemoral ligament was then dissected free from the overlying sartorial  and past expansion.  [name] suture anchor was placed in the isometric point at  the medial femoral epicondyle and the medial patellofemoral ligament was  anchored in position.  At this time, with the knee flexed to about 20  degrees, the medial patellofemoral ligament was sutured back to [name] prepared  medial aspect of the patella.  Bony trough was created.  [employer]ek was then  used to drill symmetric holes.  #1 Ethibond suture was placed in modified  Mason-Alan fashion through [name] point approximately 1 cm  medial to the detached  ligamentous sleeve of tissue.  This advanced the medial patellofemoral  ligament up very nicely.  _________ was also advanced distally on the  patella in [name] similar fashion through drill holes.  Following [name] tightening of  sutures, excellent medial restraint had been achieved.  The patient had full  range of motion without patellar subluxation at 80 degrees.  Sutures were  tied and reinforced with 0 Vicryl suture.  Retinaculum was then closed over  the patellar construct using 0 Vicryl suture.  Tourniquet was released at  this time.  Bleeding points were controlled  using electrocautery.  Skin was closed using interrupted inverted 2-0 Vicryl  suture and running 3-0 Prolene.  The patient tolerated the procedure well  without immediate complications, placed in [name] bulky knee dressing and knee  immobilizer with [name] lateral bolsters.                                               Burnard Bunting, M.D.    GSD/MEDQ  D:  11/29/2003  T:  11/29/2003  Job:  [id number]

## 2010-10-05 NOTE — Op Note (Signed)
[place]. [facility]  Patient:    [name], [name]                          MRN: [id number] Proc. Date: 11/16/00 Adm. Date:  [id number] Attending:  Glenna Fellows [place]app[place]                           Operative Report  PREOPERATIVE DIAGNOSES:  Cholelithiasis and cholecystitis.  POSTOPERATIVE DIAGNOSES:  Cholelithiasis and cholecystitis.  OPERATION/PROCEDURE:  Laparoscopic cholecystectomy.  SURGEON:  Lorne Skeens. Hoxworth, M.D.  ASSISTANT: Velora Heckler, M.D.  ANESTHESIA:  General.  INDICATIONS:  The patient is a 35 year old black female with a possibly one-year history of intermittent right upper quadrant abdominal pain.  She now presents to the emergency room with two days of much more severe, constant, right upper quadrant abdominal pain, nausea and vomiting.  Work-up has included a gallbladder ultrasound showing a thickened gallbladder wall and a 2 cm stone impacted in the gallbladder neck.  Laparoscopic cholecystectomy for acute cholecystitis had been recommended and accepted. The nature of the procedure and indications, risks of bleeding, infection, bowel and bile duct injury were discussing and understood.  She is now brought to the operating room for this procedure.  DESCRIPTION OF PROCEDURE:  The patient was brought to the operating room and placed in the supine position on the operating table and general endotracheal anesthesia was induced.  Local anesthesia was used to infiltrate the trocar sites.  A 1 cm incision was made in the umbilicus and dissection carried down to the midline fascia.  This was sharply incised for 1 cm and the peritoneum entered under direct vision.  Through a mattress suture of 0 Vicryl, the Hasson trocar was placed and pneumoperitoneum established.  Under direct vision a 10 mm trocar was placed in the subxiphoid area and two 5 mm trocars along the right subcostal margin.  The gallbladder was visualized.  It was edematous  and tense, but no evidence of infection or gangrene.  The gallbladder was aspirated with the needle aspirator which returned clear bile. The fundus was then grasped and elevated up with the liver and the infundibulum retracted inferolaterally.  Fibrofatty tissue was stripped off the neck of the gallbladder toward the porta hepatis.  There was a lot of edema but no fibrosis.  The distal gallbladder and Calots triangle were thoroughly dissected.  The cystic artery was seen coursing upon the gallbladder wall and divided with two proximal and one distal clip.  The distal gallbladder was dissected 360 degrees around the cystic duct, the cystic duct dissected over over about 1 cm.  When the anatomy was clear, the cystic duct was doubly clipped proximally, clipped distally and divided.  The gallbladder was then dissected free from its bed using hook cautery and removed through the umbilicus.  Hemostasis was obtained of the gallbladder bed with cautery.  The right upper quadrant was irrigated and suctioned and inspected for hemostasis.  Trocars were removed under direct vision and all CO2 evacuated from the peritoneal cavity.  Pursestring suture was secured to the umbilicus.  Skin incisions were closed with interrupted subcuticular 4-0 Monocryl and Steri-Strips.  Sponge, instrument and needle counts were correct x 2.  Dry sterile dressing was applied and the patient taken to the recovery room in good condition. DD:  11/16/00 TD:  11/16/00 Job: [id number]

## 2010-10-06 ENCOUNTER — Emergency Department (HOSPITAL_COMMUNITY)
Admission: EM | Admit: 2010-10-06 | Discharge: 2010-10-07 | Disposition: A | Discharge status: Home / Self Care | Payer: Medicaid Other | Attending: Emergency Medicine | Admitting: Emergency Medicine

## 2010-10-06 DIAGNOSIS — S335XXA Sprain of ligaments of lumbar spine, initial encounter: Secondary | ICD-10-CM | POA: Insufficient documentation

## 2010-10-06 DIAGNOSIS — Y92410 Unspecified street and highway as the place of occurrence of the external cause: Secondary | ICD-10-CM | POA: Insufficient documentation

## 2010-10-06 DIAGNOSIS — S139XXA Sprain of joints and ligaments of unspecified parts of neck, initial encounter: Secondary | ICD-10-CM | POA: Insufficient documentation

## 2010-10-07 ENCOUNTER — Emergency Department (HOSPITAL_COMMUNITY): Payer: Medicaid Other

## 2010-11-28 ENCOUNTER — Other Ambulatory Visit: Payer: Self-pay | Admitting: Orthopedic Surgery

## 2010-11-28 DIAGNOSIS — M5410 Radiculopathy, site unspecified: Secondary | ICD-10-CM

## 2010-11-28 DIAGNOSIS — R531 Weakness: Secondary | ICD-10-CM

## 2010-11-28 DIAGNOSIS — M545 Low back pain, unspecified: Secondary | ICD-10-CM

## 2010-12-04 ENCOUNTER — Ambulatory Visit
Admission: RE | Admit: 2010-12-04 | Discharge: 2010-12-04 | Disposition: A | Discharge status: Home / Self Care | Payer: Medicaid Other | Source: Ambulatory Visit | Attending: Orthopedic Surgery | Admitting: Orthopedic Surgery

## 2010-12-04 DIAGNOSIS — M5410 Radiculopathy, site unspecified: Secondary | ICD-10-CM

## 2010-12-04 DIAGNOSIS — R531 Weakness: Secondary | ICD-10-CM

## 2010-12-04 DIAGNOSIS — M545 Low back pain, unspecified: Secondary | ICD-10-CM

## 2010-12-23 ENCOUNTER — Emergency Department (HOSPITAL_COMMUNITY)
Admission: EM | Admit: 2010-12-23 | Discharge: 2010-12-23 | Disposition: A | Discharge status: Home / Self Care | Payer: Medicaid Other | Attending: Emergency Medicine | Admitting: Emergency Medicine

## 2010-12-23 DIAGNOSIS — Z0389 Encounter for observation for other suspected diseases and conditions ruled out: Secondary | ICD-10-CM | POA: Insufficient documentation

## 2010-12-23 LAB — CBC
HCT: 41.3 % (ref 36.0–46.0)
MCH: 32.1 pg (ref 26.0–34.0)
MCHC: 34.4 g/dL (ref 30.0–36.0)
MCV: 93.4 fL (ref 78.0–100.0)
Platelets: 225 10*3/uL (ref 150–400)
RDW: 12.5 % (ref 11.5–15.5)
WBC: 5.7 10*3/uL (ref 4.0–10.5)

## 2010-12-23 LAB — COMPREHENSIVE METABOLIC PANEL
Alkaline Phosphatase: 79 U/L (ref 39–117)
BUN: 8 mg/dL (ref 6–23)
CO2: 28 mEq/L (ref 19–32)
Chloride: 106 mEq/L (ref 96–112)
Creatinine, Ser: 0.69 mg/dL (ref 0.50–1.10)
GFR calc Af Amer: >60 mL/min (ref >60)
GFR calc non Af Amer: >60 mL/min (ref >60)
Glucose, Bld: 90 mg/dL (ref 70–99)
Potassium: 2.9 mEq/L — ABNORMAL LOW (ref 3.5–5.1)
Total Bilirubin: 0.6 mg/dL (ref 0.3–1.2)

## 2010-12-23 LAB — CK TOTAL AND CKMB (NOT AT ARMC): Relative Index: INVALID (ref 0.0–2.5)

## 2010-12-23 LAB — DIFFERENTIAL
Eosinophils Absolute: 0.1 10*3/uL (ref 0.0–0.7)
Eosinophils Relative: 1 % (ref 0–5)
Lymphocytes Relative: 53 % — ABNORMAL HIGH (ref 12–46)
Lymphs Abs: 3 10*3/uL (ref 0.7–4.0)
Monocytes Absolute: 0.3 10*3/uL (ref 0.1–1.0)

## 2011-01-02 ENCOUNTER — Emergency Department (HOSPITAL_COMMUNITY)
Admission: EM | Admit: 2011-01-02 | Discharge: 2011-01-03 | Disposition: A | Discharge status: Home / Self Care | Payer: Medicaid Other | Attending: Emergency Medicine | Admitting: Emergency Medicine

## 2011-01-02 ENCOUNTER — Emergency Department (HOSPITAL_COMMUNITY): Payer: Medicaid Other

## 2011-01-02 DIAGNOSIS — R079 Chest pain, unspecified: Secondary | ICD-10-CM | POA: Insufficient documentation

## 2011-01-02 DIAGNOSIS — R002 Palpitations: Secondary | ICD-10-CM | POA: Insufficient documentation

## 2011-01-02 DIAGNOSIS — H8309 Labyrinthitis, unspecified ear: Secondary | ICD-10-CM | POA: Insufficient documentation

## 2011-01-02 DIAGNOSIS — R55 Syncope and collapse: Secondary | ICD-10-CM | POA: Insufficient documentation

## 2011-01-02 DIAGNOSIS — I10 Essential (primary) hypertension: Secondary | ICD-10-CM | POA: Insufficient documentation

## 2011-01-02 DIAGNOSIS — R5381 Other malaise: Secondary | ICD-10-CM | POA: Insufficient documentation

## 2011-01-02 LAB — CBC
Hemoglobin: 14.3 g/dL (ref 12.0–15.0)
MCV: 94.4 fL (ref 78.0–100.0)
Platelets: 275 10*3/uL (ref 150–400)
RBC: 4.43 MIL/uL (ref 3.87–5.11)
WBC: 7.2 10*3/uL (ref 4.0–10.5)

## 2011-01-02 LAB — DIFFERENTIAL
Eosinophils Absolute: 0.1 10*3/uL (ref 0.0–0.7)
Lymphocytes Relative: 41 % (ref 12–46)
Lymphs Abs: 3 10*3/uL (ref 0.7–4.0)
Neutro Abs: 3.6 10*3/uL (ref 1.7–7.7)
Neutrophils Relative %: 50 % (ref 43–77)

## 2011-01-02 LAB — POCT I-STAT, CHEM 8
BUN: 9 mg/dL (ref 6–23)
Chloride: 103 mEq/L (ref 96–112)
Creatinine, Ser: 0.6 mg/dL (ref 0.50–1.10)
Potassium: 3.3 mEq/L — ABNORMAL LOW (ref 3.5–5.1)
Sodium: 144 mEq/L (ref 135–145)

## 2011-01-02 LAB — POCT I-STAT TROPONIN I

## 2011-01-14 ENCOUNTER — Encounter: Payer: Self-pay | Admitting: *Deleted

## 2011-01-14 ENCOUNTER — Emergency Department (HOSPITAL_BASED_OUTPATIENT_CLINIC_OR_DEPARTMENT_OTHER)
Admission: EM | Admit: 2011-01-14 | Discharge: 2011-01-14 | Disposition: A | Discharge status: Home / Self Care | Payer: Medicaid Other | Attending: Emergency Medicine | Admitting: Emergency Medicine

## 2011-01-14 DIAGNOSIS — L0291 Cutaneous abscess, unspecified: Secondary | ICD-10-CM

## 2011-01-14 DIAGNOSIS — L0231 Cutaneous abscess of buttock: Secondary | ICD-10-CM | POA: Insufficient documentation

## 2011-01-14 MED ORDER — DOXYCYCLINE HYCLATE 100 MG PO CAPS: 100.0000 mg | ORAL_CAPSULE | Freq: Two times a day (BID) | ORAL | Status: AC

## 2011-01-14 NOTE — ED Provider Notes (Signed)
History     CSN: [id number] Arrival date & time: 01/14/2011  8:26 PM  Chief Complaint  Patient presents with  . Abscess   Patient is a 35 y.o. female presenting with abscess. The history is provided by the patient. No language interpreter was used.  Abscess  This is a new problem. The current episode started less than one week ago. The onset was sudden. The problem has been unchanged. The abscess is present on the right buttock. The problem is moderate. The abscess is characterized by redness and painfulness. There were no sick contacts. She has received no recent medical care.    History reviewed. No pertinent past medical history.  Past Surgical History  Procedure Date  . Partial hysterectomy   . Cholecystectomy open   . Ovarian cyst removal   . Patella fracture surgery   . R arm surgery     No family history on file.  History  Substance Use Topics  . Smoking status: Not on file  . Smokeless tobacco: Not on file  . Alcohol Use: Not on file    OB History    Grav Para Term Preterm Abortions TAB SAB Ect Mult Living                  Review of Systems  Constitutional: Negative.   Respiratory: Negative.   Cardiovascular: Negative.   Musculoskeletal: Negative.   Skin:       Pt c/o abscess to the right buttock  Neurological: Negative.     Physical Exam  BP 139/97  Pulse 81  Temp(Src) 99.4 F (37.4 C) (Oral)  Resp 18  Ht 6' (1.829 m)  Wt 205 lb (92.987 kg)  BMI 27.80 kg/m2  SpO2 98%  Physical Exam  Nursing note and vitals reviewed. Constitutional: She is oriented to person, place, and time. She appears well-developed and well-nourished.  Cardiovascular: Normal rate and regular rhythm.   Pulmonary/Chest: Effort normal and breath sounds normal.  Genitourinary:       No concern for perirectal abscess  Musculoskeletal: Normal range of motion.  Neurological: She is alert and oriented to person, place, and time.  Skin:       Pt has a red fluctuant area to the  right buttock    ED Course  Procedures  MDM Pt numbed with lido with epi and then refused further treatment:told pt would give antibiotics:pt very upset and stated that last time she got dilaudid:discussed with pt that you give percocet here:pt is refusing further treatment      Teressa Lower, NP 01/14/11 2054  Teressa Lower, NP 01/14/11 5621  Teressa Lower, NP 01/14/11 2056

## 2011-01-14 NOTE — ED Notes (Signed)
Pt. Has abcess on the R inner butt cheek per Pt.

## 2011-01-14 NOTE — ED Provider Notes (Signed)
Medical screening examination/treatment/procedure(s) were conducted as a shared visit with non-physician practitioner(s) and myself.  I personally evaluated the patient during the encounter  Pt refused to allow Ms. Pickering to drain abscess, demanding IM dilaudid. I discussed that dilaudid was not necessary to drain an abscess and offered PO medications prior to drainage. Abscess was anesthetized by Ms. Pickering. Fluctuant area incised using a #11 blade with moderate amount of pus expressed. 1/4" packing gauze placed. Pt tolerated well without need for pain medications.   Charles B. Bernette Mayers, MD 01/14/11 2113

## 2011-01-14 NOTE — ED Notes (Signed)
Pt has abscess to inner right buttock.  No drainage noted.

## 2011-01-24 ENCOUNTER — Encounter (INDEPENDENT_AMBULATORY_CARE_PROVIDER_SITE_OTHER): Payer: Self-pay | Admitting: General Surgery

## 2011-01-25 ENCOUNTER — Ambulatory Visit (INDEPENDENT_AMBULATORY_CARE_PROVIDER_SITE_OTHER): Payer: Medicaid Other | Admitting: General Surgery

## 2011-02-08 LAB — CBC
HCT: 44.8
Hemoglobin: 15.2 — ABNORMAL HIGH
RDW: 12.8

## 2011-02-08 LAB — DIFFERENTIAL
Basophils Absolute: 0
Eosinophils Relative: 2
Lymphocytes Relative: 38
Lymphs Abs: 2.7
Monocytes Absolute: 0.5

## 2011-02-08 LAB — I-STAT 8, (EC8 V) (CONVERTED LAB)
Bicarbonate: 28.1 — ABNORMAL HIGH
HCT: 49 — ABNORMAL HIGH
Hemoglobin: 16.7 — ABNORMAL HIGH
Operator id: 282201
Sodium: 142
TCO2: 30

## 2011-02-08 LAB — POCT CARDIAC MARKERS: Troponin i, poc: <0.05

## 2011-02-08 LAB — POCT I-STAT CREATININE: Operator id: 282201

## 2011-02-13 ENCOUNTER — Encounter (INDEPENDENT_AMBULATORY_CARE_PROVIDER_SITE_OTHER): Payer: Self-pay | Admitting: General Surgery

## 2011-02-13 LAB — POCT I-STAT, CHEM 8
BUN: 8
Creatinine, Ser: 0.9
Potassium: 3.7
Sodium: 143

## 2011-03-11 ENCOUNTER — Other Ambulatory Visit (INDEPENDENT_AMBULATORY_CARE_PROVIDER_SITE_OTHER): Payer: Self-pay | Admitting: General Surgery

## 2011-03-11 ENCOUNTER — Encounter (INDEPENDENT_AMBULATORY_CARE_PROVIDER_SITE_OTHER): Payer: Self-pay | Admitting: General Surgery

## 2011-03-11 ENCOUNTER — Ambulatory Visit (INDEPENDENT_AMBULATORY_CARE_PROVIDER_SITE_OTHER): Payer: Medicaid Other | Admitting: General Surgery

## 2011-03-11 VITALS — BP 138/92 | HR 68 | Temp 98.7°F | Resp 16 | Ht 72.0 in | Wt 198.2 lb

## 2011-03-11 DIAGNOSIS — L03818 Cellulitis of other sites: Secondary | ICD-10-CM

## 2011-03-11 DIAGNOSIS — L0291 Cutaneous abscess, unspecified: Secondary | ICD-10-CM

## 2011-03-11 DIAGNOSIS — L02818 Cutaneous abscess of other sites: Secondary | ICD-10-CM

## 2011-03-11 NOTE — Patient Instructions (Signed)
Tomorrow morning given the toe and washed the perianal and all areas of skin folds vigorously with some type of soaked in antibiotic bowel whatever and then these areas twice a day. If you want to put a little bit of Betadine solution right over the areas that would be okay with [place] is the most important part consisted of Betadine. See me in one week and he will be only Septra DS b.i.d. for 3 weeks

## 2011-03-11 NOTE — Progress Notes (Signed)
Subjective:     Patient ID: [name], female   DOB: 1976/03/08, 35 y.o.   MRN: [id number]  HPIThe patient is a 35 year old female palpation a minute when she was 18 first drain thank you. Hidradenitis in the axilla and then she's had a pilonidal cyst removed about age 42 and then more recently she's had 3 visits to the emergency room one of the procedures 68 were then done examinations on time the CT of the abdomen and pelvis didn't finding of frank abscess or edema in the gluteal area and placed on antibiotics for approximately one week he states that they drained little areas on 3 occasions looking back in the old records I can't pull up into the actual ER records that I can pull up the culture reports this is growth of strep callus and over-the-counter skin type infections that are associated with perirectal area she said they did a rectal exam couldn't find any evidence of communication with the rectum and this time since since reoccurred month or 2 interval elected to return to see me since the areas that I had done previously had not reoccur   Review of Systems Current Outpatient Prescriptions  Medication Sig Dispense Refill  . Cholecalciferol (VITAMIN D3) 1000 UNITS CAPS Take 1 capsule by mouth 3 (three) times daily.        . vitamin B-12 (CYANOCOBALAMIN) 1000 MCG tablet Take 2,000 mcg by mouth 2 (two) times daily.         Past Surgical History  Procedure Date  . Partial hysterectomy   . Cholecystectomy open   . Ovarian cyst removal   . R arm surgery   . Gastric bypass 2011    gastric sleeve  . Knee surgery 2005    left  . Cystectomy     2004   Allergies  Allergen Reactions  . Penicillins Rash    Face and throat swell        Objective:   Physical ExamBP 138/92  Pulse 68  Temp(Src) 98.7 F (37.1 C) (Temporal)  Resp 16  Ht 6' (1.829 m)  Wt 198 lb 3.2 oz (89.903 kg)  BMI 26.88 kg/m2 Brief examination for now and limited to the abdomen and groin and perirectal  areas she's got multiple holes indentations etc. from areas of probably hidradenitis and has a more score around the pilonidal area but the actual area of tenderness is inferior to the right of the perirectal area about 3 cm from the anal verge that's and in listening C. drainage when you press the swollen area and in the left side a little bit more posterior was a large area of the larger areas of cellulitis and edema elected to go ahead and drain the area and I did do a culture and basically removed the overlying skin I cannot find a communication and actually goes to the anus at these locations were close enough to the perirectal area that I would not be shocked if there was a communication to the rectum she's not have a true perirectal abscess with the multiple areas and, hidradenitis this is most likely soft tissue type of problems  She said each of the previous times I put on antibiotics for approximately a week I would recommend we put on Septra she's not allergic to sulfur for approximately 3 weeks and let the plan on seeing her problem about a week but continue on antibiotics for at least 3 weeks I don't see any areas that obviously needs  OR drainage poorly hidradenitis but whether this is going to be a recurrent problem would not is definitely of concern     Assessment:       Multiple areas of tenderness skin and soft tissue abscess in the none immediate  Clear but close to the anus in a patient who's had previous areas of hidradenitis axilla pain Areas and also upon allowing don't find anything that needs OR drainage at this time and we'll re\re expect her in one week after a week of Cipro b.i.d. I cultured areas but the 2 previous cultures showed catastrophic callus with her allergic to penicillin I think separate be the best choicePlan:

## 2011-03-14 LAB — WOUND CULTURE
Gram Stain: NONE SEEN
Organism ID, Bacteria: NO GROWTH

## 2011-03-20 ENCOUNTER — Encounter (INDEPENDENT_AMBULATORY_CARE_PROVIDER_SITE_OTHER): Payer: Medicaid Other | Admitting: General Surgery

## 2011-04-03 ENCOUNTER — Encounter (INDEPENDENT_AMBULATORY_CARE_PROVIDER_SITE_OTHER): Payer: Self-pay | Admitting: General Surgery

## 2011-04-04 ENCOUNTER — Encounter (INDEPENDENT_AMBULATORY_CARE_PROVIDER_SITE_OTHER): Payer: Medicaid Other | Admitting: General Surgery

## 2011-05-21 DIAGNOSIS — N949 Unspecified condition associated with female genital organs and menstrual cycle: Secondary | ICD-10-CM

## 2011-05-21 HISTORY — DX: Unspecified condition associated with female genital organs and menstrual cycle: N94.9

## 2011-12-04 ENCOUNTER — Encounter (HOSPITAL_BASED_OUTPATIENT_CLINIC_OR_DEPARTMENT_OTHER): Payer: Self-pay | Admitting: *Deleted

## 2011-12-04 ENCOUNTER — Emergency Department (HOSPITAL_BASED_OUTPATIENT_CLINIC_OR_DEPARTMENT_OTHER): Payer: Medicaid Other

## 2011-12-04 ENCOUNTER — Emergency Department (HOSPITAL_BASED_OUTPATIENT_CLINIC_OR_DEPARTMENT_OTHER)
Admission: EM | Admit: 2011-12-04 | Discharge: 2011-12-04 | Disposition: A | Discharge status: Home / Self Care | Payer: Medicaid Other | Attending: Emergency Medicine | Admitting: Emergency Medicine

## 2011-12-04 DIAGNOSIS — Z9089 Acquired absence of other organs: Secondary | ICD-10-CM | POA: Insufficient documentation

## 2011-12-04 DIAGNOSIS — F17200 Nicotine dependence, unspecified, uncomplicated: Secondary | ICD-10-CM | POA: Insufficient documentation

## 2011-12-04 DIAGNOSIS — K2970 Gastritis, unspecified, without bleeding: Secondary | ICD-10-CM

## 2011-12-04 DIAGNOSIS — K2990 Gastroduodenitis, unspecified, without bleeding: Secondary | ICD-10-CM | POA: Insufficient documentation

## 2011-12-04 LAB — URINALYSIS, ROUTINE W REFLEX MICROSCOPIC
Glucose, UA: NEGATIVE mg/dL
Ketones, ur: NEGATIVE mg/dL
Leukocytes, UA: NEGATIVE
Nitrite: NEGATIVE
Protein, ur: NEGATIVE mg/dL

## 2011-12-04 LAB — COMPREHENSIVE METABOLIC PANEL
AST: 23 U/L (ref 0–37)
Albumin: 3.5 g/dL (ref 3.5–5.2)
Calcium: 8.7 mg/dL (ref 8.4–10.5)
Chloride: 104 mEq/L (ref 96–112)
Creatinine, Ser: 0.8 mg/dL (ref 0.50–1.10)
Total Protein: 7 g/dL (ref 6.0–8.3)

## 2011-12-04 LAB — CBC WITH DIFFERENTIAL/PLATELET
Basophils Absolute: 0 10*3/uL (ref 0.0–0.1)
Basophils Relative: 1 % (ref 0–1)
Eosinophils Absolute: 0.1 10*3/uL (ref 0.0–0.7)
Eosinophils Relative: 2 % (ref 0–5)
HCT: 46.7 % — ABNORMAL HIGH (ref 36.0–46.0)
MCH: 32.9 pg (ref 26.0–34.0)
MCHC: 35.1 g/dL (ref 30.0–36.0)
Monocytes Absolute: 0.3 10*3/uL (ref 0.1–1.0)
Neutro Abs: 3.6 10*3/uL (ref 1.7–7.7)
RDW: 12.7 % (ref 11.5–15.5)

## 2011-12-04 LAB — LIPASE, BLOOD: Lipase: 54 U/L (ref 11–59)

## 2011-12-04 MED ORDER — MORPHINE SULFATE 4 MG/ML IJ SOLN
4.0000 mg | Freq: Once | INTRAMUSCULAR | Status: AC
  Administered 2011-12-04: 4 mg via INTRAVENOUS
  Filled 2011-12-04: qty 1

## 2011-12-04 MED ORDER — ONDANSETRON HCL 4 MG/2ML IJ SOLN
4.0000 mg | Freq: Once | INTRAMUSCULAR | Status: AC
  Administered 2011-12-04: 4 mg via INTRAVENOUS
  Filled 2011-12-04: qty 2

## 2011-12-04 MED ORDER — PANTOPRAZOLE SODIUM 40 MG IV SOLR
40.0000 mg | Freq: Once | INTRAVENOUS | Status: AC
  Administered 2011-12-04: 40 mg via INTRAVENOUS
  Filled 2011-12-04: qty 40

## 2011-12-04 MED ORDER — OMEPRAZOLE MAGNESIUM 20 MG PO TBEC: 20.0000 mg | DELAYED_RELEASE_TABLET | Freq: Every day | ORAL | Status: DC

## 2011-12-04 MED ORDER — IOHEXOL 300 MG/ML  SOLN
36.0000 mL | Freq: Once | INTRAMUSCULAR | Status: AC | PRN
  Administered 2011-12-04: 36 mL via ORAL

## 2011-12-04 MED ORDER — IOHEXOL 300 MG/ML  SOLN
100.0000 mL | Freq: Once | INTRAMUSCULAR | Status: AC | PRN
  Administered 2011-12-04: 100 mL via INTRAVENOUS

## 2011-12-04 NOTE — ED Notes (Signed)
Pt c/o lower abd pain and lower back pain x 3 days

## 2011-12-04 NOTE — ED Provider Notes (Addendum)
History     CSN: [id number]  Arrival date & time 12/04/11  1525   First MD Initiated Contact with Patient 12/04/11 1642      Chief Complaint  Patient presents with  . Abdominal Pain  . Back Pain    (Consider location/radiation/quality/duration/timing/severity/associated sxs/prior treatment) Patient is a 36 y.o. female presenting with abdominal pain and back pain. The history is provided by the patient.  Abdominal Pain The primary symptoms of the illness include abdominal pain, nausea, vomiting and diarrhea. The primary symptoms of the illness do not include fever, shortness of breath, dysuria, vaginal discharge or vaginal bleeding. Episode onset: 3 days ago. The onset of the illness was gradual. The problem has been gradually worsening.  The abdominal pain is located in the RUQ and epigastric region. The abdominal pain radiates to the back. The severity of the abdominal pain is 7/10. The abdominal pain is relieved by nothing. The abdominal pain is exacerbated by vomiting and eating.  The vomiting began yesterday. Vomiting occurred once. The emesis contains stomach contents.  The illness is associated with alcohol use (A moderate amount of alcohol and Saturday and symptoms started Sunday night. Denies using NSAIDs frequently). The patient states that she believes she is currently not pregnant. The patient has had a change in bowel habit. Additional symptoms associated with the illness include anorexia and back pain. Symptoms associated with the illness do not include constipation, urgency or frequency.  Back Pain  Associated symptoms include abdominal pain. Pertinent negatives include no fever and no dysuria.    Past Medical History  Diagnosis Date  . Sebaceous cyst   . Abscess   . Anemia     Past Surgical History  Procedure Date  . Partial hysterectomy   . Cholecystectomy open   . Ovarian cyst removal   . R arm surgery   . Gastric bypass 2011    gastric sleeve  . Knee surgery  2005    left  . Cystectomy     20 04    Family History  Problem Relation Age of Onset  . Cancer Maternal Grandfather     lung    History  Substance Use Topics  . Smoking status: Current Everyday Smoker  . Smokeless tobacco: Not on file  . Alcohol Use: No    OB History    Grav Para Term Preterm Abortions TAB SAB Ect Mult Living                  Review of Systems  Constitutional: Negative for fever.  Respiratory: Negative for shortness of breath.   Gastrointestinal: Positive for nausea, vomiting, abdominal pain, diarrhea and anorexia. Negative for constipation.  Genitourinary: Negative for dysuria, urgency, frequency, vaginal bleeding and vaginal discharge.  Musculoskeletal: Positive for back pain.  All other systems reviewed and are negative.    Allergies  Penicillins  Home Medications   Current Outpatient Rx  Name Route Sig Dispense Refill  . VITAMIN D3 1000 UNITS PO CAPS Oral Take 1 capsule by mouth 3 (three) times daily.      . IBUPROFEN 200 MG PO TABS Oral Take 400 mg by mouth every 6 (six) hours as needed. Patient used this medication for her headache.    Marland Kitchen VITAMIN B-12 1000 MCG PO TABS Oral Take 2,000 mcg by mouth 2 (two) times daily.        BP 166/91  Pulse 88  Temp 99.1 F (37.3 C)  Resp 16  Ht 6' (1.829 m)  [employer]  210 lb (95.255 kg)  BMI 28.48 kg/m2  SpO2 100%  Physical Exam  Nursing note and vitals reviewed. Constitutional: She is oriented to person, place, and time. She appears well-developed and well-nourished. No distress.  HENT:  Head: Normocephalic and atraumatic.  Mouth/Throat: Oropharynx is clear and moist.  Eyes: Conjunctivae and EOM are normal. Pupils are equal, round, and reactive to light.  Neck: Normal range of motion. Neck supple.  Cardiovascular: Normal rate, regular rhythm and intact distal pulses.   No murmur heard. Pulmonary/Chest: Effort normal and breath sounds normal. No respiratory distress. She has no wheezes. She has no  rales.  Abdominal: Soft. Normal appearance. She exhibits no distension. There is tenderness in the right upper quadrant and epigastric area. There is no rebound and no guarding.  Musculoskeletal: Normal range of motion. She exhibits no edema and no tenderness.  Neurological: She is alert and oriented to person, place, and time.  Skin: Skin is warm and dry. No rash noted. No erythema.  Psychiatric: She has a normal mood and affect. Her behavior is normal.    ED Course  Procedures (including critical care time)  Labs Reviewed  URINALYSIS, ROUTINE W REFLEX MICROSCOPIC - Abnormal; Notable for the following:    Color, Urine AMBER (*)  BIOCHEMICALS MAY BE AFFECTED BY COLOR   APPearance CLOUDY (*)     All other components within normal limits  CBC WITH DIFFERENTIAL - Abnormal; Notable for the following:    Hemoglobin 16.4 (*)     HCT 46.7 (*)     All other components within normal limits  COMPREHENSIVE METABOLIC PANEL - Abnormal; Notable for the following:    Glucose, Bld 102 (*)     Total Bilirubin 1.3 (*)     All other components within normal limits  LIPASE, BLOOD   Ct Abdomen Pelvis W Contrast  12/04/2011  *RADIOLOGY REPORT*  Clinical Data: Abdominal pain.  Previous gastric sleeve.  CT ABDOMEN AND PELVIS WITH CONTRAST  Technique:  Multidetector CT imaging of the abdomen and pelvis was performed following the standard protocol during bolus administration of intravenous contrast.  Contrast: 36mL OMNIPAQUE IOHEXOL 300 MG/ML  SOLN, OMNIPAQUE IOHEXOL 300 MG/ML  SOLN  Comparison: 09/29/2010  Findings: Visualized lung bases clear.  Staple line along the greater curvature of the stomach.  Vascular clips in the gallbladder fossa.  8 mm nonspecific hyperdense subcapsular lesion in the anterior right hepatic segment.  Unremarkable spleen, adrenal glands, kidneys, pancreas, abdominal aorta, small bowel, appendix, colon.  Urinary bladder is incompletely distended. Interval enlargement   of 6.7 cm  cystic right adnexal process. Previous hysterectomy.  Regional bones unremarkable.  IMPRESSION:  1.  Enlargement of cystic right adnexal process.  Consider pelvic ultrasound for further characterization.  Original Report Authenticated By: Thora Lance III, M.D.     1. Gastritis       MDM   Patient with history of gastric bypass done in 2011 which was a gastric sleeve status post cholecystectomy 7 years ago. For the last 3 days patient's had right upper quadrant and epigastric abdominal pain. It is worse with eating and she states she has bloating. She is moderately tender on exam without peritoneal signs. She's had one episode of vomiting yesterday but no active vomiting. The symptoms started on Sunday after she drank a moderate amount on Saturday. Labs are within normal limits other than mild dehydration with a hemoglobin of 16. Patient given IV fluids, pain and nausea control, Protonix. CT scan pending  however feel this is most likely gastritis or ulcer disease.  6:43 PM CT neg except for right adnexal cyst which is not the source of pain.  Suspect PUD vs gastritis.  Pt started on prilosec and started on PPI.      Gwyneth Sprout, MD 12/04/11 1845  Gwyneth Sprout, MD 12/04/11 1610

## 2012-09-30 ENCOUNTER — Encounter (HOSPITAL_COMMUNITY): Payer: Self-pay | Admitting: Emergency Medicine

## 2012-09-30 ENCOUNTER — Emergency Department (HOSPITAL_COMMUNITY)
Admission: EM | Admit: 2012-09-30 | Discharge: 2012-09-30 | Discharge status: Against Medical Advice | Payer: Medicaid Other | Attending: Emergency Medicine | Admitting: Emergency Medicine

## 2012-09-30 ENCOUNTER — Emergency Department (HOSPITAL_COMMUNITY): Payer: Medicaid Other

## 2012-09-30 DIAGNOSIS — Z862 Personal history of diseases of the blood and blood-forming organs and certain disorders involving the immune mechanism: Secondary | ICD-10-CM | POA: Insufficient documentation

## 2012-09-30 DIAGNOSIS — F17200 Nicotine dependence, unspecified, uncomplicated: Secondary | ICD-10-CM | POA: Insufficient documentation

## 2012-09-30 DIAGNOSIS — Z79899 Other long term (current) drug therapy: Secondary | ICD-10-CM | POA: Insufficient documentation

## 2012-09-30 DIAGNOSIS — R0789 Other chest pain: Secondary | ICD-10-CM | POA: Insufficient documentation

## 2012-09-30 DIAGNOSIS — Z872 Personal history of diseases of the skin and subcutaneous tissue: Secondary | ICD-10-CM | POA: Insufficient documentation

## 2012-09-30 DIAGNOSIS — Z880 Allergy status to penicillin: Secondary | ICD-10-CM | POA: Insufficient documentation

## 2012-09-30 DIAGNOSIS — R079 Chest pain, unspecified: Secondary | ICD-10-CM

## 2012-09-30 DIAGNOSIS — I10 Essential (primary) hypertension: Secondary | ICD-10-CM | POA: Insufficient documentation

## 2012-09-30 LAB — CBC
MCH: 35 pg — ABNORMAL HIGH (ref 26.0–34.0)
MCV: 98.1 fL (ref 78.0–100.0)
Platelets: 245 10*3/uL (ref 150–400)
RBC: 4.71 MIL/uL (ref 3.87–5.11)

## 2012-09-30 LAB — BASIC METABOLIC PANEL
BUN: 7 mg/dL (ref 6–23)
CO2: 26 mEq/L (ref 19–32)
Calcium: 8.5 mg/dL (ref 8.4–10.5)
Creatinine, Ser: 0.8 mg/dL (ref 0.50–1.10)

## 2012-09-30 LAB — POCT I-STAT TROPONIN I: Troponin i, poc: 0 ng/mL (ref 0.00–0.08)

## 2012-09-30 MED ORDER — SODIUM CHLORIDE 0.9 % IV BOLUS (SEPSIS): 1000.0000 mL | Freq: Once | INTRAVENOUS | Status: DC

## 2012-09-30 MED ORDER — ONDANSETRON HCL 4 MG/2ML IJ SOLN
4.0000 mg | Freq: Once | INTRAMUSCULAR | Status: AC
  Administered 2012-09-30: 4 mg via INTRAVENOUS
  Filled 2012-09-30: qty 2

## 2012-09-30 MED ORDER — ASPIRIN 325 MG PO TABS
325.0000 mg | ORAL_TABLET | ORAL | Status: AC
  Administered 2012-09-30: 325 mg via ORAL
  Filled 2012-09-30: qty 1

## 2012-09-30 MED ORDER — NITROGLYCERIN 0.4 MG SL SUBL
0.4000 mg | SUBLINGUAL_TABLET | SUBLINGUAL | Status: DC | PRN
  Administered 2012-09-30 (×2): 0.4 mg via SUBLINGUAL
  Filled 2012-09-30: qty 25

## 2012-09-30 MED ORDER — MORPHINE SULFATE 4 MG/ML IJ SOLN
4.0000 mg | Freq: Once | INTRAMUSCULAR | Status: AC
  Administered 2012-09-30: 4 mg via INTRAVENOUS
  Filled 2012-09-30: qty 1

## 2012-09-30 NOTE — ED Provider Notes (Signed)
History     CSN: [id number]  Arrival date & time 09/30/12  0454   First MD Initiated Contact with Patient 09/30/12 [phone]      Chief Complaint  Patient presents with  . Chest Pain    (Consider location/radiation/quality/duration/timing/severity/associated sxs/prior treatment) HPI Comments: Patient is a 37 year old female with a past medical history of anginal chest pain and hypertension who presents with chest pain that started around 1am this morning. Patient describes the pain as a "heaviness" and rates the pain as severe. Patient reports associated left arm pain. The pain has been constant since the onset with episodes of waxing and waning without known trigger. Patient reports this pain feels different from her anginal chest pain in the past. Patient did not try anything for symptoms. Patient denies family history of cardiac disease. Patient is a current everyday smoker, 1 pack per day.    Past Medical History  Diagnosis Date  . Sebaceous cyst   . Abscess   . Anemia     Past Surgical History  Procedure Laterality Date  . Partial hysterectomy    . Cholecystectomy open    . Ovarian cyst removal    . R arm surgery    . Gastric bypass  2011    gastric sleeve  . Knee surgery  2005    left  . Cystectomy      2004    Family History  Problem Relation Age of Onset  . Cancer Maternal Grandfather     lung    History  Substance Use Topics  . Smoking status: Current Every Day Smoker  . Smokeless tobacco: Not on file  . Alcohol Use: No    OB History   Grav Para Term Preterm Abortions TAB SAB Ect Mult Living                  Review of Systems  Cardiovascular: Positive for chest pain.  All other systems reviewed and are negative.    Allergies  Penicillins  Home Medications   Current Outpatient Rx  Name  Route  Sig  Dispense  Refill  . hydrochlorothiazide (HYDRODIURIL) 25 MG tablet   Oral   Take 25 mg by mouth daily.           There were no vitals taken  for this visit.  Physical Exam  Nursing note and vitals reviewed. Constitutional: She is oriented to person, place, and time. She appears well-developed and well-nourished. No distress.  HENT:  Head: Normocephalic and atraumatic.  Eyes: Conjunctivae and EOM are normal.  Neck: Normal range of motion.  Cardiovascular: Normal rate and regular rhythm.  Exam reveals no gallop and no friction rub.   No murmur heard. Pulmonary/Chest: Effort normal and breath sounds normal. She has no wheezes. She has no rales. She exhibits no tenderness.  Abdominal: Soft. She exhibits no distension. There is no tenderness. There is no rebound and no guarding.  Musculoskeletal: Normal range of motion.  Neurological: She is alert and oriented to person, place, and time. Coordination normal.  Speech is goal-oriented. Moves limbs without ataxia.   Skin: Skin is warm and dry.  Psychiatric: She has a normal mood and affect. Her behavior is normal.    ED Course  Procedures (including critical care time)  Labs Reviewed  CBC - Abnormal; Notable for the following:    Hemoglobin 16.5 (*)    HCT 46.2 (*)    MCH 35.0 (*)    All other components within  normal limits  BASIC METABOLIC PANEL - Abnormal; Notable for the following:    Potassium 3.2 (*)    All other components within normal limits  POCT I-STAT TROPONIN I   Dg Chest 2 View  09/30/2012   *RADIOLOGY REPORT*  Clinical Data: Chest pain  CHEST - 2 VIEW  Comparison: 01/02/2011  Findings: Lungs are clear. No pleural effusion or pneumothorax. The cardiomediastinal contours are within normal limits. The visualized bones and soft tissues are without significant appreciable abnormality.  IMPRESSION: No radiographic evidence of acute cardiopulmonary process.   Original Report Authenticated By: Jearld Lesch, M.D.     1. Chest pain       MDM  6:37 AM Labs unremarkable. Troponin negative. Chest xray pending. Vitals stable and patient afebrile. Patient will  have nitro and aspirin.   10:01 AM Patient will have one more troponin and if negative, patient can be discharged. Patient wants to leave since "no on has paid attention to her." I explained to the patient that we wanted to perform another lab test to rule out cardiac cause of chest pain but she does not want to stay. I explained to her the risk of death and other adverse outcomes if she decides to leave. She understands and would like to leave. I will sign the patient out AMA.       Emilia Beck, PA-C 09/30/12 1004

## 2012-09-30 NOTE — ED Notes (Signed)
PT. REPORTS CHEST HEAVINESS ONSET THIS MORNING , DENIES SOB , NAUSEA OR DIAPHORESIS . ALSO REPORTS THROAT PAIN /LEFT ARM PAIN .

## 2012-09-30 NOTE — ED Notes (Signed)
Care transferred, report received Cindy, RN. 

## 2012-09-30 NOTE — ED Notes (Signed)
No changes from triage assessement

## 2012-10-01 NOTE — ED Provider Notes (Signed)
Medical screening examination/treatment/procedure(s) were performed by non-physician practitioner and as supervising physician I was immediately available for consultation/collaboration.   Hanley Seamen, MD 10/01/12 [phone]

## 2012-11-20 ENCOUNTER — Emergency Department (HOSPITAL_COMMUNITY)
Admission: EM | Admit: 2012-11-20 | Discharge: 2012-11-20 | Disposition: A | Discharge status: Home / Self Care | Payer: Medicaid Other | Attending: Emergency Medicine | Admitting: Emergency Medicine

## 2012-11-20 ENCOUNTER — Emergency Department (HOSPITAL_COMMUNITY): Payer: Medicaid Other

## 2012-11-20 ENCOUNTER — Encounter (HOSPITAL_COMMUNITY): Payer: Self-pay

## 2012-11-20 DIAGNOSIS — I10 Essential (primary) hypertension: Secondary | ICD-10-CM | POA: Insufficient documentation

## 2012-11-20 DIAGNOSIS — M25462 Effusion, left knee: Secondary | ICD-10-CM

## 2012-11-20 DIAGNOSIS — M25469 Effusion, unspecified knee: Secondary | ICD-10-CM | POA: Insufficient documentation

## 2012-11-20 DIAGNOSIS — F17200 Nicotine dependence, unspecified, uncomplicated: Secondary | ICD-10-CM | POA: Insufficient documentation

## 2012-11-20 DIAGNOSIS — Z79899 Other long term (current) drug therapy: Secondary | ICD-10-CM | POA: Insufficient documentation

## 2012-11-20 DIAGNOSIS — Z9889 Other specified postprocedural states: Secondary | ICD-10-CM | POA: Insufficient documentation

## 2012-11-20 DIAGNOSIS — Z862 Personal history of diseases of the blood and blood-forming organs and certain disorders involving the immune mechanism: Secondary | ICD-10-CM | POA: Insufficient documentation

## 2012-11-20 DIAGNOSIS — Z880 Allergy status to penicillin: Secondary | ICD-10-CM | POA: Insufficient documentation

## 2012-11-20 DIAGNOSIS — Z87828 Personal history of other (healed) physical injury and trauma: Secondary | ICD-10-CM | POA: Insufficient documentation

## 2012-11-20 DIAGNOSIS — Z872 Personal history of diseases of the skin and subcutaneous tissue: Secondary | ICD-10-CM | POA: Insufficient documentation

## 2012-11-20 HISTORY — DX: Essential (primary) hypertension: I10

## 2012-11-20 MED ORDER — HYDROCODONE-ACETAMINOPHEN 5-325 MG PO TABS: 1.0000 | ORAL_TABLET | ORAL | Status: DC | PRN

## 2012-11-20 NOTE — ED Provider Notes (Signed)
History  This chart was scribed for non-physician practitioner working with Ethelda Chick, MD by Greggory Stallion, ED scribe. This patient was seen in room WTR7/WTR7 and the patient's care was started at 6:07 PM.  CSN: [id number] Arrival date & time 11/20/12  1742   Chief Complaint  Patient presents with  . Knee Pain   The history is provided by the patient. No language interpreter was used.    HPI Comments: [name] is a 37 y.o. female who presents to the Emergency Department complaining of intermittent left knee pain that started 1 year ago. Pt states she had LT patella surgery with lateral release a few years ago. She states her knee pops out of place intermittently for the last year. She states it popped out yesterday and is now very swollen and very painful. She and went to urgent care this morning.  was given a steroid shot and a knee sleeve without crutches but the fluid was not drained off. She having difficulty applying pressure to her knee which is now causing pain to her right knee.. Pt states the knee popped out again today with the knee sleeve.  Past Medical History  Diagnosis Date  . Sebaceous cyst   . Abscess   . Anemia   . Hypertension    Past Surgical History  Procedure Laterality Date  . Partial hysterectomy    . Cholecystectomy open    . Ovarian cyst removal    . R arm surgery    . Gastric bypass  2011    gastric sleeve  . Knee surgery  2005    left  . Cystectomy      2004   Family History  Problem Relation Age of Onset  . Cancer Maternal Grandfather     lung   History  Substance Use Topics  . Smoking status: Current Every Day Smoker -- 0.25 packs/day    Types: Cigarettes  . Smokeless tobacco: Not on file  . Alcohol Use: Yes     Comment: occasionally   OB History   Grav Para Term Preterm Abortions TAB SAB Ect Mult Living                 Review of Systems  Musculoskeletal: Positive for arthralgias.  All other systems reviewed and are  negative.    Allergies  Penicillins  Home Medications   Current Outpatient Rx  Name  Route  Sig  Dispense  Refill  . hydrochlorothiazide (HYDRODIURIL) 25 MG tablet   Oral   Take 25 mg by mouth daily.          BP 141/97  Pulse 84  Temp(Src) 99.3 F (37.4 C)  Resp 16  Ht 6' (1.829 m)  Wt 215 lb (97.523 kg)  BMI 29.15 kg/m2  SpO2 100%  Physical Exam  Nursing note and vitals reviewed. Constitutional: She is oriented to person, place, and time. She appears well-developed and well-nourished. No distress.  HENT:  Head: Normocephalic and atraumatic.  Eyes: EOM are normal.  Neck: Neck supple. No tracheal deviation present.  Cardiovascular: Normal rate.   Pulmonary/Chest: Effort normal. No respiratory distress.  Musculoskeletal:       Left knee: She exhibits decreased range of motion, swelling and effusion. Tenderness found.  Neurological: She is alert and oriented to person, place, and time.  Skin: Skin is warm and dry.  Psychiatric: She has a normal mood and affect. Her behavior is normal.    ED Course  Procedures (including critical  care time)  DIAGNOSTIC STUDIES: Oxygen Saturation is 100% on RA, normal by my interpretation.    COORDINATION OF CARE: 6:33 PM-Discussed treatment plan which includes xray and a knee immobilizer, crutches, and pain medication for home with pt at bedside and pt agreed to plan. Advised pt to follow up with Dr. August Saucer in orthopedics.   Labs Reviewed - No data to display Dg Knee Complete 4 Views Left  11/20/2012   *RADIOLOGY REPORT*  Clinical Data: Left knee injury history of left patellar dislocation.  LEFT KNEE - COMPLETE 4+ VIEW  Comparison: 07/15/2003  Findings: There is a small left knee effusion.  Gas is identified within the joint space.  Sharpening tibial spines noted.  Marginal spur spur formation is present.  No fractures identified.  IMPRESSION:  1.  Joint effusion which contains a small amount of gas. 2.  Mild osteoarthritis.    Original Report Authenticated By: Signa Kell, M.D.   1. Knee effusion, left     MDM   Patients xrays show knee effusion. Small amount of gas in knee, she was given a cortisone injection a few hours ago which I believe is most likely the etiology of the gas. She has significant swelling but I do not want to drain fluid since it was already tapped earlier today.   She requests Dr. August Saucer as he did her previous surgeries. Rx pain medication, knee immobilizer, crutches.   37 y.o.Rosali Augello Peay's evaluation in the Emergency Department is complete. It has been determined that no acute conditions requiring further emergency intervention are present at this time. The patient/guardian have been advised of the diagnosis and plan. We have discussed signs and symptoms that warrant return to the ED, such as changes or worsening in symptoms.  Vital signs are stable at discharge. Filed Vitals:   11/20/12 1824  BP: 141/97  Pulse: 84  Temp: 99.3 F (37.4 C)  Resp: 16    Patient/guardian has voiced understanding and agreed to follow-up with the PCP or specialist.  I personally performed the services described in this documentation, which was scribed in my presence. The recorded information has been reviewed and is accurate.   Dorthula Matas, PA-C 11/20/12 1904

## 2012-11-20 NOTE — ED Notes (Signed)
Pt had LT patella surgery w/lateral release "years ago.  States her knee pops out of place intermittently x 1 year.  Yesterday it happened so she went to UC this am.  She was given a steroid shot and an immobilizer but the fluid wasn't drained.  Pt states the knee popped out again today w/the immobilizer on.  States her RT knee has been hurting her from torn meniscus x 2 years. States she compensates w/the RT leg for the LT leg and with both hurting she feels she can't drive.

## 2012-11-20 NOTE — ED Provider Notes (Signed)
Medical screening examination/treatment/procedure(s) were performed by non-physician practitioner and as supervising physician I was immediately available for consultation/collaboration.  Ethelda Chick, MD 11/20/12 [phone]

## 2013-02-02 ENCOUNTER — Ambulatory Visit: Payer: Self-pay | Admitting: Physician Assistant

## 2013-02-02 VITALS — BP 118/78 | HR 99 | Temp 99.1°F | Resp 18 | Ht 71.0 in | Wt 216.2 lb

## 2013-02-02 DIAGNOSIS — Z0289 Encounter for other administrative examinations: Secondary | ICD-10-CM

## 2013-02-02 NOTE — Progress Notes (Signed)
  Subjective:    Patient ID: [name], female    DOB: 10/14/1975, 37 y.o.   MRN: [id number]  HPI 37 year old female presents for DOT physical.  She is healthy with no known medical problems.  Has hx of gastric bypass surgery 3 years ago and has lost 130 pounds.  Does not have HTN now. Not on any medications currently.  Denies chest pains, SOB, palpitations, headache, dizziness, joint pains, or abdominal pain. 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  Patient is doing well with no other concerns today.      Review of Systems  Constitutional: Negative.   HENT: Negative.   Eyes: Negative.   Respiratory: Negative.   Cardiovascular: Negative.   Gastrointestinal: Negative.   Endocrine: Negative.   Genitourinary: Negative.   Musculoskeletal: Negative.   Neurological: Negative.        Objective:   Physical Exam  Constitutional: She is oriented to person, place, and time. She appears well-developed and well-nourished.  HENT:  Head: Normocephalic and atraumatic.  Right Ear: Hearing, tympanic membrane, external ear and ear canal normal.  Left Ear: Hearing, tympanic membrane, external ear and ear canal normal.  Mouth/Throat: Uvula is midline, oropharynx is clear and moist and mucous membranes are normal. No oropharyngeal exudate.  Eyes: Conjunctivae, EOM and lids are normal. Pupils are equal, round, and reactive to light.  Neck: Normal range of motion. Neck supple.  Cardiovascular: Normal rate, regular rhythm and normal heart sounds.   Pulmonary/Chest: Effort normal and breath sounds normal.  Musculoskeletal:       Right shoulder: Normal.       Left shoulder: Normal.       Right knee: Normal.       Left knee: Normal.  Lymphadenopathy:    She has no cervical adenopathy.  Neurological: She is alert and oriented to person, place, and time. She has normal strength. Gait normal.  Reflex Scores:      Patellar reflexes are 2+ on the right side and 2+ on the left  side. Psychiatric: She has a normal mood and affect. Her behavior is normal. Judgment and thought content normal.          Assessment & Plan:  Health examination of defined subpopulation  DOT form completed and 2 year card provided Follow up for regular exams with PCP or here as needed.

## 2013-02-03 ENCOUNTER — Encounter: Payer: Self-pay | Admitting: Physician Assistant

## 2013-03-25 ENCOUNTER — Emergency Department (HOSPITAL_BASED_OUTPATIENT_CLINIC_OR_DEPARTMENT_OTHER)
Admission: EM | Admit: 2013-03-25 | Discharge: 2013-03-25 | Disposition: A | Discharge status: Home / Self Care | Payer: Medicaid Other | Attending: Emergency Medicine | Admitting: Emergency Medicine

## 2013-03-25 ENCOUNTER — Encounter (HOSPITAL_BASED_OUTPATIENT_CLINIC_OR_DEPARTMENT_OTHER): Payer: Self-pay | Admitting: Emergency Medicine

## 2013-03-25 DIAGNOSIS — Z9884 Bariatric surgery status: Secondary | ICD-10-CM | POA: Insufficient documentation

## 2013-03-25 DIAGNOSIS — F17200 Nicotine dependence, unspecified, uncomplicated: Secondary | ICD-10-CM | POA: Insufficient documentation

## 2013-03-25 DIAGNOSIS — L304 Erythema intertrigo: Secondary | ICD-10-CM

## 2013-03-25 DIAGNOSIS — N61 Mastitis without abscess: Secondary | ICD-10-CM | POA: Insufficient documentation

## 2013-03-25 DIAGNOSIS — Z79899 Other long term (current) drug therapy: Secondary | ICD-10-CM | POA: Insufficient documentation

## 2013-03-25 DIAGNOSIS — Z880 Allergy status to penicillin: Secondary | ICD-10-CM | POA: Insufficient documentation

## 2013-03-25 DIAGNOSIS — N611 Abscess of the breast and nipple: Secondary | ICD-10-CM

## 2013-03-25 DIAGNOSIS — Z862 Personal history of diseases of the blood and blood-forming organs and certain disorders involving the immune mechanism: Secondary | ICD-10-CM | POA: Insufficient documentation

## 2013-03-25 DIAGNOSIS — I10 Essential (primary) hypertension: Secondary | ICD-10-CM | POA: Insufficient documentation

## 2013-03-25 DIAGNOSIS — L538 Other specified erythematous conditions: Secondary | ICD-10-CM | POA: Insufficient documentation

## 2013-03-25 MED ORDER — CLOTRIMAZOLE 1 % EX CREA: TOPICAL_CREAM | CUTANEOUS | Status: DC

## 2013-03-25 MED ORDER — LORAZEPAM 1 MG PO TABS: 1.0000 mg | ORAL_TABLET | Freq: Every evening | ORAL | Status: DC | PRN

## 2013-03-25 NOTE — ED Provider Notes (Addendum)
CSN: [id number]     Arrival date & time 03/25/13  0222 History   First MD Initiated Contact with Patient 03/25/13 0235     Chief Complaint  Patient presents with  . Abscess   (Consider location/radiation/quality/duration/timing/severity/associated sxs/prior Treatment) HPI This is a 37 year old female with a history of multiple abscesses as well as suppurative hidradenitis. She is here with a tender, swollen area on the medial aspect of her right breast. It has been present for one week but has gotten worse. There has been a small amount of green purulent drainage. She denies fever, chills, nausea or vomiting. Pain is moderate to severe and worse with palpation or movement. She is on oxycodone which is not adequately relieving her pain. There is also redness within the right submammary fold.  Past Medical History  Diagnosis Date  . Sebaceous cyst   . Abscess   . Anemia   . Hypertension    Past Surgical History  Procedure Laterality Date  . Partial hysterectomy    . Cholecystectomy open    . Ovarian cyst removal    . R arm surgery    . Gastric bypass  2011    gastric sleeve  . Knee surgery  2005    left  . Cystectomy      2004  . Joint replacement    . Fracture surgery    . Abdominal hysterectomy     Family History  Problem Relation Age of Onset  . Cancer Maternal Grandfather     lung   History  Substance Use Topics  . Smoking status: Current Every Day Smoker -- 0.25 packs/day    Types: Cigarettes  . Smokeless tobacco: Never Used  . Alcohol Use: No   OB History   Grav Para Term Preterm Abortions TAB SAB Ect Mult Living                 Review of Systems  All other systems reviewed and are negative.    Allergies  Penicillins  Home Medications   Current Outpatient Rx  Name  Route  Sig  Dispense  Refill  . oxyCODONE (OXY IR/ROXICODONE) 5 MG immediate release tablet   Oral   Take 10 mg by mouth every 4 (four) hours as needed for severe pain.         .  hydrochlorothiazide (HYDRODIURIL) 25 MG tablet   Oral   Take 25 mg by mouth daily.         Marland Kitchen HYDROcodone-acetaminophen (NORCO/VICODIN) 5-325 MG per tablet   Oral   Take 1 tablet by mouth every 4 (four) hours as needed for pain.   20 tablet   0    BP 156/91  Pulse 94  Temp(Src) 99.1 F (37.3 C) (Oral)  Resp 20  Ht 6' (1.829 m)  Wt 217 lb (98.431 kg)  BMI 29.42 kg/m2  SpO2 98%  Physical Exam General: Well-developed, well-nourished female in no acute distress; appearance consistent with age of record HENT: normocephalic; atraumatic Eyes: Normal appearance Neck: supple Heart: regular rate and rhythm Lungs: Normal respiratory effort and excursion Abdomen: soft; nondistended Extremities: No deformity; full range of motion Neurologic: Awake, alert and oriented; motor function intact in all extremities and symmetric; no facial droop Skin: Warm and dry; tender, indurated, fluctuant mass medial right breast; intertriginous rash of the right submammary fold Psychiatric: Normal mood and affect    ED Course  Procedures (including critical care time)  INCISION AND DRAINAGE Performed by: Paula Libra L Consent: Verbal  consent obtained. Risks and benefits: risks, benefits and alternatives were discussed Type: abscess  Body area: Medial right breast  Anesthesia: local infiltration  Incision was made with a scalpel.  Local anesthetic: lidocaine 2 % with epinephrine  Anesthetic total: 3 ml  Complexity: complex Blunt dissection to break up loculations  Drainage: purulent  Drainage amount: Moderate; foul-smelling; sent for culture   Packing material: 1/4 in iodoform gauze  Patient tolerance: Patient tolerated the procedure well with no immediate complications.     MDM  Patient has had multiple abscesses in the past and is familiar with post I&D care. She will slowly remove the packing over the next several days. She'll return if symptoms worsen.  We will also treat  for a candidal intertrigo.    Hanley Seamen, MD 03/25/13 4098  Hanley Seamen, MD 03/25/13 [phone]

## 2013-03-25 NOTE — ED Notes (Signed)
Abscess on right breast.

## 2013-03-27 LAB — CULTURE, ROUTINE-ABSCESS

## 2013-04-15 ENCOUNTER — Encounter (HOSPITAL_BASED_OUTPATIENT_CLINIC_OR_DEPARTMENT_OTHER): Payer: Self-pay | Admitting: Emergency Medicine

## 2013-04-15 ENCOUNTER — Emergency Department (HOSPITAL_BASED_OUTPATIENT_CLINIC_OR_DEPARTMENT_OTHER)
Admission: EM | Admit: 2013-04-15 | Discharge: 2013-04-15 | Disposition: A | Discharge status: Home / Self Care | Payer: Medicaid Other | Attending: Emergency Medicine | Admitting: Emergency Medicine

## 2013-04-15 DIAGNOSIS — I10 Essential (primary) hypertension: Secondary | ICD-10-CM | POA: Insufficient documentation

## 2013-04-15 DIAGNOSIS — L0291 Cutaneous abscess, unspecified: Secondary | ICD-10-CM

## 2013-04-15 DIAGNOSIS — F17200 Nicotine dependence, unspecified, uncomplicated: Secondary | ICD-10-CM | POA: Insufficient documentation

## 2013-04-15 DIAGNOSIS — Z79899 Other long term (current) drug therapy: Secondary | ICD-10-CM | POA: Insufficient documentation

## 2013-04-15 DIAGNOSIS — L0231 Cutaneous abscess of buttock: Secondary | ICD-10-CM | POA: Insufficient documentation

## 2013-04-15 DIAGNOSIS — Z880 Allergy status to penicillin: Secondary | ICD-10-CM | POA: Insufficient documentation

## 2013-04-15 DIAGNOSIS — Z862 Personal history of diseases of the blood and blood-forming organs and certain disorders involving the immune mechanism: Secondary | ICD-10-CM | POA: Insufficient documentation

## 2013-04-15 MED ORDER — SULFAMETHOXAZOLE-TRIMETHOPRIM 800-160 MG PO TABS: 1.0000 | ORAL_TABLET | Freq: Two times a day (BID) | ORAL | Status: DC

## 2013-04-15 MED ORDER — HYDROCODONE-ACETAMINOPHEN 5-325 MG PO TABS: 1.0000 | ORAL_TABLET | Freq: Four times a day (QID) | ORAL | Status: DC | PRN

## 2013-04-15 MED ORDER — LIDOCAINE-EPINEPHRINE-TETRACAINE (LET) SOLUTION
3.0000 mL | Freq: Once | NASAL | Status: AC
  Administered 2013-04-15: 3 mL via TOPICAL
  Filled 2013-04-15: qty 3

## 2013-04-15 MED ORDER — OXYCODONE-ACETAMINOPHEN 5-325 MG PO TABS
1.0000 | ORAL_TABLET | Freq: Once | ORAL | Status: AC
  Administered 2013-04-15: 1 via ORAL
  Filled 2013-04-15: qty 1

## 2013-04-15 NOTE — ED Provider Notes (Signed)
CSN: [id number]     Arrival date & time 04/15/13  4098 History   First MD Initiated Contact with Patient 04/15/13 0100     Chief Complaint  Patient presents with  . Abscess   (Consider location/radiation/quality/duration/timing/severity/associated sxs/prior Treatment) Patient is a 37 y.o. female presenting with abscess. The history is provided by the patient.  Abscess Location:  Ano-genital Ano-genital abscess location:  R buttock Abscess quality: induration, painful and warmth   Red streaking: no   Duration:  4 days Progression:  Worsening Pain details:    Quality:  Dull   Severity:  Moderate   Timing:  Constant   Progression:  Worsening Chronicity:  Recurrent Context: not diabetes   Relieved by:  Nothing Worsened by:  Nothing tried Ineffective treatments:  None tried Associated symptoms: no fever   Risk factors: prior abscess     Past Medical History  Diagnosis Date  . Sebaceous cyst   . Abscess   . Anemia   . Hypertension    Past Surgical History  Procedure Laterality Date  . Partial hysterectomy    . Cholecystectomy open    . Ovarian cyst removal    . R arm surgery    . Gastric bypass  2011    gastric sleeve  . Knee surgery  2005    left  . Cystectomy      2004  . Joint replacement    . Fracture surgery    . Abdominal hysterectomy     Family History  Problem Relation Age of Onset  . Cancer Maternal Grandfather     lung   History  Substance Use Topics  . Smoking status: Current Every Day Smoker -- 0.25 packs/day    Types: Cigarettes  . Smokeless tobacco: Never Used  . Alcohol Use: No   OB History   Grav Para Term Preterm Abortions TAB SAB Ect Mult Living                 Review of Systems  Constitutional: Negative for fever.  All other systems reviewed and are negative.    Allergies  Penicillins  Home Medications   Current Outpatient Rx  Name  Route  Sig  Dispense  Refill  . cyclobenzaprine (FLEXERIL) 10 MG tablet   Oral   Take  10 mg by mouth 3 (three) times daily as needed for muscle spasms.         . diclofenac (VOLTAREN) 25 MG EC tablet   Oral   Take 25 mg by mouth 2 (two) times daily.         Marland Kitchen gabapentin (NEURONTIN) 100 MG capsule   Oral   Take 100 mg by mouth 2 (two) times daily.         . hydrochlorothiazide (HYDRODIURIL) 25 MG tablet   Oral   Take 25 mg by mouth daily.         Marland Kitchen LORazepam (ATIVAN) 1 MG tablet   Oral   Take 1-2 tablets (1-2 mg total) by mouth at bedtime as needed for sleep.   15 tablet   0   . oxyCODONE (OXY IR/ROXICODONE) 5 MG immediate release tablet   Oral   Take 10 mg by mouth every 4 (four) hours as needed for severe pain.         . clotrimazole (LOTRIMIN) 1 % cream      Apply to rash of right breast and chest wall twice daily.         Marland Kitchen HYDROcodone-acetaminophen (  NORCO/VICODIN) 5-325 MG per tablet   Oral   Take 1 tablet by mouth every 4 (four) hours as needed for pain.   20 tablet   0    BP 153/91  Pulse 98  Temp(Src) 99 F (37.2 C) (Oral)  Resp 20  Ht 6' (1.829 m)  Wt 220 lb (99.791 kg)  BMI 29.83 kg/m2  SpO2 99% Physical Exam  Constitutional: She is oriented to person, place, and time. She appears well-developed and well-nourished. No distress.  HENT:  Head: Normocephalic and atraumatic.  Eyes: EOM are normal.  Neck: Normal range of motion. Neck supple.  Cardiovascular: Normal rate and regular rhythm.   Pulmonary/Chest: Effort normal and breath sounds normal.  Abdominal: Soft. Bowel sounds are normal.  Musculoskeletal: Normal range of motion.  Neurological: She is alert and oriented to person, place, and time.  Skin: Skin is warm and dry.     Psychiatric: She has a normal mood and affect.    ED Course  Procedures (including critical care time) Labs Review Labs Reviewed - No data to display Imaging Review No results found.  EKG Interpretation   None       MDM  No diagnosis found. INCISION AND DRAINAGE Performed by:  Jasmine Awe Consent: Verbal consent obtained. Risks and benefits: risks, benefits and alternatives were discussed Type: abscess  Body area: right buttock  Anesthesia: local infiltration  Incision was made with a scalpel.  Local anesthetic: lidocaine 1% with epinephrine  Anesthetic total: 3 ml  Complexity: complex Blunt dissection to break up loculations  Drainage: purulent  Drainage amount: copious ~ 20 cc  Packing material: 1/4 in iodoform gauze  Patient tolerance: Patient tolerated the procedure well with no immediate complications.   Return in 48 hours for packing removal and recheck sooner for worsening symptoms   Corinthia Helmers K Jakaria Lavergne-Rasch, MD 04/15/13 0981

## 2013-04-15 NOTE — ED Notes (Addendum)
Abscess rt lower buttock onset Monday and getting worse  Red swollen area

## 2013-04-15 NOTE — ED Notes (Signed)
Pt noticed abscess to right lower buttock that developed on Monday and has progressively worsened, pt was here recently and had abscess on chest i/d'd and packed but states that she was not on any antibiotics

## 2013-04-30 ENCOUNTER — Emergency Department (HOSPITAL_BASED_OUTPATIENT_CLINIC_OR_DEPARTMENT_OTHER)
Admission: EM | Admit: 2013-04-30 | Discharge: 2013-04-30 | Disposition: A | Discharge status: Home / Self Care | Payer: Medicaid Other | Attending: Emergency Medicine | Admitting: Emergency Medicine

## 2013-04-30 ENCOUNTER — Encounter (HOSPITAL_BASED_OUTPATIENT_CLINIC_OR_DEPARTMENT_OTHER): Payer: Self-pay | Admitting: Emergency Medicine

## 2013-04-30 DIAGNOSIS — Z862 Personal history of diseases of the blood and blood-forming organs and certain disorders involving the immune mechanism: Secondary | ICD-10-CM | POA: Insufficient documentation

## 2013-04-30 DIAGNOSIS — Z872 Personal history of diseases of the skin and subcutaneous tissue: Secondary | ICD-10-CM | POA: Insufficient documentation

## 2013-04-30 DIAGNOSIS — Z880 Allergy status to penicillin: Secondary | ICD-10-CM | POA: Insufficient documentation

## 2013-04-30 DIAGNOSIS — I10 Essential (primary) hypertension: Secondary | ICD-10-CM | POA: Insufficient documentation

## 2013-04-30 DIAGNOSIS — N61 Mastitis without abscess: Secondary | ICD-10-CM | POA: Insufficient documentation

## 2013-04-30 DIAGNOSIS — Z792 Long term (current) use of antibiotics: Secondary | ICD-10-CM | POA: Insufficient documentation

## 2013-04-30 DIAGNOSIS — N611 Abscess of the breast and nipple: Secondary | ICD-10-CM

## 2013-04-30 DIAGNOSIS — Z79899 Other long term (current) drug therapy: Secondary | ICD-10-CM | POA: Insufficient documentation

## 2013-04-30 DIAGNOSIS — F17200 Nicotine dependence, unspecified, uncomplicated: Secondary | ICD-10-CM | POA: Insufficient documentation

## 2013-04-30 MED ORDER — LIDOCAINE-EPINEPHRINE 1 %-1:100000 IJ SOLN
INTRAMUSCULAR | Status: AC
  Administered 2013-04-30: 1 mL
  Filled 2013-04-30: qty 1

## 2013-04-30 MED ORDER — SULFAMETHOXAZOLE-TRIMETHOPRIM 800-160 MG PO TABS: 1.0000 | ORAL_TABLET | Freq: Two times a day (BID) | ORAL | Status: DC

## 2013-04-30 MED ORDER — HYDROCODONE-ACETAMINOPHEN 5-325 MG PO TABS: 1.0000 | ORAL_TABLET | ORAL | Status: DC | PRN

## 2013-04-30 MED ORDER — SULFAMETHOXAZOLE-TMP DS 800-160 MG PO TABS
1.0000 | ORAL_TABLET | Freq: Once | ORAL | Status: AC
  Administered 2013-04-30: 1 via ORAL
  Filled 2013-04-30: qty 1

## 2013-04-30 NOTE — ED Provider Notes (Addendum)
CSN: [id number]     Arrival date & time 04/30/13  0034 History   First MD Initiated Contact with Patient 04/30/13 0045     Chief Complaint  Patient presents with  . Abscess   (Consider location/radiation/quality/duration/timing/severity/associated sxs/prior Treatment) HPI This is a 37 year old female with a history of suppurative hidradenitis. She has had a recurrent abscess of the medial right breast, I&D by myself on November 6 of this year. The pus was foul smelling and sent for culture which grew out Lactobacillus species. She returns with a recurrence at the same site that has been present for 3 days. It is worsening and there is moderate to severe pain, worse with palpation. The pain radiates to her back. The abscess is not pointing. It is indurated. Right submammary intertrigal rash noticed on previous visit has resolved with prescribed topical antifungal.  Past Medical History  Diagnosis Date  . Sebaceous cyst   . Abscess   . Anemia   . Hypertension    Past Surgical History  Procedure Laterality Date  . Partial hysterectomy    . Cholecystectomy open    . Ovarian cyst removal    . R arm surgery    . Gastric bypass  2011    gastric sleeve  . Knee surgery  2005    left  . Cystectomy      2004  . Joint replacement    . Fracture surgery    . Abdominal hysterectomy     Family History  Problem Relation Age of Onset  . Cancer Maternal Grandfather     lung   History  Substance Use Topics  . Smoking status: Current Every Day Smoker -- 0.25 packs/day    Types: Cigarettes  . Smokeless tobacco: Never Used  . Alcohol Use: No   OB History   Grav Para Term Preterm Abortions TAB SAB Ect Mult Living                 Review of Systems  All other systems reviewed and are negative.    Allergies  Penicillins  Home Medications   Current Outpatient Rx  Name  Route  Sig  Dispense  Refill  . topiramate (TOPAMAX) 50 MG tablet   Oral   Take 50 mg by mouth 2 (two) times  daily.         . clotrimazole (LOTRIMIN) 1 % cream      Apply to rash of right breast and chest wall twice daily.         . cyclobenzaprine (FLEXERIL) 10 MG tablet   Oral   Take 10 mg by mouth 3 (three) times daily as needed for muscle spasms.         . diclofenac (VOLTAREN) 25 MG EC tablet   Oral   Take 25 mg by mouth 2 (two) times daily.         Marland Kitchen gabapentin (NEURONTIN) 100 MG capsule   Oral   Take 100 mg by mouth 2 (two) times daily.         . hydrochlorothiazide (HYDRODIURIL) 25 MG tablet   Oral   Take 25 mg by mouth daily.         Marland Kitchen HYDROcodone-acetaminophen (NORCO) 5-325 MG per tablet   Oral   Take 1 tablet by mouth every 6 (six) hours as needed.   10 tablet   0   . HYDROcodone-acetaminophen (NORCO/VICODIN) 5-325 MG per tablet   Oral   Take 1 tablet by mouth every 4 (  four) hours as needed for pain.   20 tablet   0   . LORazepam (ATIVAN) 1 MG tablet   Oral   Take 1-2 tablets (1-2 mg total) by mouth at bedtime as needed for sleep.   15 tablet   0   . oxyCODONE (OXY IR/ROXICODONE) 5 MG immediate release tablet   Oral   Take 10 mg by mouth every 4 (four) hours as needed for severe pain.         Marland Kitchen sulfamethoxazole-trimethoprim (BACTRIM DS,SEPTRA DS) 800-160 MG per tablet   Oral   Take 1 tablet by mouth 2 (two) times daily. One po bid x 7 days   14 tablet   0    BP 158/107  Pulse 95  Temp(Src) 99 F (37.2 C) (Oral)  Resp 18  Ht 6' (1.829 m)  Wt 207 lb (93.895 kg)  BMI 28.07 kg/m2  SpO2 99%  Physical Exam General: Well-developed, well-nourished female in no acute distress; appearance consistent with age of record HENT: normocephalic; atraumatic Eyes: pupils equal, round and reactive to light; extraocular muscles intact Neck: supple Heart: regular rate and rhythm Lungs: clear to auscultation bilaterally Chest: Tender, indurated lesion medial right breast consistent with deep abscess; previous incision has healed well Abdomen: soft;  nondistended Extremities: No deformity; full range of motion Neurologic: Awake, alert and oriented; motor function intact in all extremities and symmetric; no facial droop Skin: Warm and dry Psychiatric: Normal mood and affect    ED Course  Procedures (including critical care time)  INCISION AND DRAINAGE Performed by: Paula Libra L Consent: Verbal consent obtained. Risks and benefits: risks, benefits and alternatives were discussed Type: abscess  Body area: Right medial breast  Anesthesia: local infiltration  Incision was made with a scalpel.  Local anesthetic: lidocaine 1 % with epinephrine  Anesthetic total: 3 ml  Complexity: complex Blunt dissection to break up loculations  Drainage: purulent, foul-smelling  Drainage amount: Moderate   Packing material: 1/4 in iodoform gauze  Patient tolerance: Patient tolerated the procedure well with no immediate complications.     MDM  Pus sent for culture. Because of its recurrence we will refer to [facility].    Hanley Seamen, MD 04/30/13 2130  Hanley Seamen, MD 04/30/13 8657

## 2013-04-30 NOTE — ED Notes (Signed)
Abscess on right breast x3 days.  Had prior abscess in same spot that was treated here.

## 2013-04-30 NOTE — ED Notes (Signed)
Right breast abscess I&D by EDP.  Pt tolerated well.  Area cleansed and gauze dressing applied.

## 2013-05-03 LAB — CULTURE, ROUTINE-ABSCESS

## 2013-05-20 DIAGNOSIS — D649 Anemia, unspecified: Secondary | ICD-10-CM

## 2013-05-20 HISTORY — DX: Anemia, unspecified: D64.9

## 2013-05-25 ENCOUNTER — Encounter (HOSPITAL_COMMUNITY): Payer: Self-pay

## 2013-05-26 ENCOUNTER — Other Ambulatory Visit (HOSPITAL_COMMUNITY): Payer: Self-pay | Admitting: Orthopedic Surgery

## 2013-06-03 NOTE — Pre-Procedure Instructions (Addendum)
[name]  06/03/2013   Your procedure is scheduled on:  Tuesday, June 08, 2013 @ 1:12 PM  Report to [facility] Short Stay (use Main Entrance "A'') at 11:00 AM.  Call this number if you have problems the morning of surgery: [phone]   Remember:   Do not eat food or drink liquids after midnight.   Take these medicines the morning of surgery with A SIP OF WATER: gabapentin (NEURONTIN) 100 MG capsule, topiramate (TOPAMAX) 50 MG tablet  Stop taking Aspirin, vitamins and herbal medications. Do not take any NSAIDs ie: Ibuprofen, Advil, Naproxen or any medication containing Aspirin ( diclofenac (VOLTAREN) 25 MG EC tablet)   Do not wear jewelry, make-up or nail polish.  Do not wear lotions, powders, or perfumes. You may wear deodorant.  Do not shave 48 hours prior to surgery.  Do not bring valuables to the hospital.  [facility] is not responsible for any belongings or valuables.               Contacts, dentures or bridgework may not be worn into surgery.  Leave suitcase in the car. After surgery it may be brought to your room.  For patients admitted to the hospital, discharge time is determined by your treatment team.               Patients discharged the day of surgery will not be allowed to drive home.  Name and phone number of your driver:   Special Instructions: Shower using CHG 2 nights before surgery and the night before surgery.  If you shower the day of surgery use CHG.  Use special wash - you have one bottle of CHG for all showers.  You should use approximately 1/3 of the bottle for each shower.   Please read over the following fact sheets that you were given: Pain Booklet, Coughing and Deep Breathing and Surgical Site Infection Prevention

## 2013-06-04 ENCOUNTER — Encounter (HOSPITAL_COMMUNITY): Payer: Self-pay

## 2013-06-04 ENCOUNTER — Encounter (HOSPITAL_COMMUNITY)
Admission: RE | Admit: 2013-06-04 | Discharge: 2013-06-04 | Disposition: A | Discharge status: Home / Self Care | Payer: Medicaid Other | Source: Ambulatory Visit | Attending: Orthopedic Surgery | Admitting: Orthopedic Surgery

## 2013-06-04 DIAGNOSIS — Z01812 Encounter for preprocedural laboratory examination: Secondary | ICD-10-CM | POA: Insufficient documentation

## 2013-06-04 HISTORY — DX: Headache: R51

## 2013-06-04 LAB — BASIC METABOLIC PANEL
BUN: 13 mg/dL (ref 6–23)
CALCIUM: 8.5 mg/dL (ref 8.4–10.5)
CO2: 27 mEq/L (ref 19–32)
CREATININE: 0.81 mg/dL (ref 0.50–1.10)
Chloride: 109 mEq/L (ref 96–112)
Glucose, Bld: 83 mg/dL (ref 70–99)
Potassium: 3.6 mEq/L — ABNORMAL LOW (ref 3.7–5.3)
Sodium: 146 mEq/L (ref 137–147)

## 2013-06-04 LAB — CBC
HCT: 43.1 % (ref 36.0–46.0)
Hemoglobin: 15.1 g/dL — ABNORMAL HIGH (ref 12.0–15.0)
MCH: 34.2 pg — ABNORMAL HIGH (ref 26.0–34.0)
MCHC: 35 g/dL (ref 30.0–36.0)
MCV: 97.5 fL (ref 78.0–100.0)
PLATELETS: 234 10*3/uL (ref 150–400)
RBC: 4.42 MIL/uL (ref 3.87–5.11)
RDW: 15 % (ref 11.5–15.5)
WBC: 5.6 10*3/uL (ref 4.0–10.5)

## 2013-06-07 MED ORDER — CLINDAMYCIN PHOSPHATE 900 MG/50ML IV SOLN
900.0000 mg | INTRAVENOUS | Status: AC
  Administered 2013-06-08: 900 mg via INTRAVENOUS
  Filled 2013-06-07: qty 50

## 2013-06-08 ENCOUNTER — Ambulatory Visit (HOSPITAL_COMMUNITY): Payer: Medicaid Other | Admitting: Certified Registered Nurse Anesthetist

## 2013-06-08 ENCOUNTER — Observation Stay (HOSPITAL_COMMUNITY): Payer: Medicaid Other

## 2013-06-08 ENCOUNTER — Observation Stay (HOSPITAL_COMMUNITY)
Admission: RE | Admit: 2013-06-08 | Discharge: 2013-06-09 | Disposition: A | Discharge status: Home / Self Care | Payer: Medicaid Other | Source: Ambulatory Visit | Attending: Orthopedic Surgery | Admitting: Orthopedic Surgery

## 2013-06-08 ENCOUNTER — Encounter (HOSPITAL_COMMUNITY): Payer: Self-pay | Admitting: Certified Registered Nurse Anesthetist

## 2013-06-08 ENCOUNTER — Encounter (HOSPITAL_COMMUNITY)
Admission: RE | Disposition: A | Discharge status: Home / Self Care | Payer: Self-pay | Source: Ambulatory Visit | Attending: Orthopedic Surgery

## 2013-06-08 ENCOUNTER — Encounter (HOSPITAL_COMMUNITY): Payer: Medicaid Other | Admitting: Certified Registered Nurse Anesthetist

## 2013-06-08 ENCOUNTER — Ambulatory Visit (HOSPITAL_COMMUNITY): Payer: Medicaid Other

## 2013-06-08 DIAGNOSIS — F17200 Nicotine dependence, unspecified, uncomplicated: Secondary | ICD-10-CM | POA: Insufficient documentation

## 2013-06-08 DIAGNOSIS — M238X9 Other internal derangements of unspecified knee: Principal | ICD-10-CM | POA: Insufficient documentation

## 2013-06-08 DIAGNOSIS — M25362 Other instability, left knee: Secondary | ICD-10-CM

## 2013-06-08 DIAGNOSIS — M2240 Chondromalacia patellae, unspecified knee: Secondary | ICD-10-CM | POA: Insufficient documentation

## 2013-06-08 DIAGNOSIS — I10 Essential (primary) hypertension: Secondary | ICD-10-CM | POA: Insufficient documentation

## 2013-06-08 DIAGNOSIS — G43909 Migraine, unspecified, not intractable, without status migrainosus: Secondary | ICD-10-CM | POA: Insufficient documentation

## 2013-06-08 HISTORY — PX: MEDIAL PATELLOFEMORAL LIGAMENT REPAIR: SHX2020

## 2013-06-08 SURGERY — RECONSTRUCTION, LIGAMENT, MEDIAL PATELLOFEMORAL
Anesthesia: General | Site: Knee | Laterality: Left

## 2013-06-08 MED ORDER — HYDROMORPHONE HCL PF 1 MG/ML IJ SOLN
INTRAMUSCULAR | Status: AC
  Administered 2013-06-08: 0.5 mg via INTRAVENOUS
  Filled 2013-06-08: qty 1

## 2013-06-08 MED ORDER — CLINDAMYCIN PHOSPHATE 600 MG/50ML IV SOLN
600.0000 mg | Freq: Three times a day (TID) | INTRAVENOUS | Status: AC
  Administered 2013-06-08 – 2013-06-09 (×2): 600 mg via INTRAVENOUS
  Filled 2013-06-08 (×3): qty 50

## 2013-06-08 MED ORDER — HYDROCHLOROTHIAZIDE 25 MG PO TABS
25.0000 mg | ORAL_TABLET | Freq: Every day | ORAL | Status: DC
  Administered 2013-06-09: 25 mg via ORAL
  Filled 2013-06-08: qty 1

## 2013-06-08 MED ORDER — MORPHINE SULFATE 4 MG/ML IJ SOLN
INTRAMUSCULAR | Status: DC | PRN
  Administered 2013-06-08: 8 mg via INTRAVENOUS

## 2013-06-08 MED ORDER — MORPHINE SULFATE 4 MG/ML IJ SOLN
INTRAMUSCULAR | Status: AC
  Filled 2013-06-08: qty 1

## 2013-06-08 MED ORDER — OXYCODONE HCL 5 MG PO TABS
5.0000 mg | ORAL_TABLET | ORAL | Status: DC | PRN
  Administered 2013-06-08 – 2013-06-09 (×6): 10 mg via ORAL
  Filled 2013-06-08 (×5): qty 2

## 2013-06-08 MED ORDER — LORAZEPAM 1 MG PO TABS
1.0000 mg | ORAL_TABLET | Freq: Every evening | ORAL | Status: DC | PRN
  Administered 2013-06-08: 1 mg via ORAL
  Filled 2013-06-08: qty 1

## 2013-06-08 MED ORDER — LACTATED RINGERS IV SOLN
INTRAVENOUS | Status: DC | PRN
  Administered 2013-06-08 (×2): via INTRAVENOUS

## 2013-06-08 MED ORDER — ONDANSETRON HCL 4 MG/2ML IJ SOLN
INTRAMUSCULAR | Status: DC | PRN
  Administered 2013-06-08: 4 mg via INTRAVENOUS

## 2013-06-08 MED ORDER — OXYCODONE HCL 5 MG PO TABS: 5.0000 mg | ORAL_TABLET | Freq: Once | ORAL | Status: DC | PRN

## 2013-06-08 MED ORDER — NEOSTIGMINE METHYLSULFATE 1 MG/ML IJ SOLN
INTRAMUSCULAR | Status: DC | PRN
  Administered 2013-06-08: 5 mg via INTRAVENOUS

## 2013-06-08 MED ORDER — OXYCODONE HCL 5 MG/5ML PO SOLN: 5.0000 mg | Freq: Once | ORAL | Status: DC | PRN

## 2013-06-08 MED ORDER — LIDOCAINE HCL (CARDIAC) 20 MG/ML IV SOLN
INTRAVENOUS | Status: DC | PRN
  Administered 2013-06-08: 100 mg via INTRAVENOUS

## 2013-06-08 MED ORDER — ROCURONIUM BROMIDE 100 MG/10ML IV SOLN
INTRAVENOUS | Status: DC | PRN
  Administered 2013-06-08: 30 mg via INTRAVENOUS
  Administered 2013-06-08: 20 mg via INTRAVENOUS

## 2013-06-08 MED ORDER — BUPIVACAINE HCL (PF) 0.25 % IJ SOLN
INTRAMUSCULAR | Status: AC
  Filled 2013-06-08: qty 30

## 2013-06-08 MED ORDER — OXYCODONE HCL 5 MG PO TABS
ORAL_TABLET | ORAL | Status: AC
  Filled 2013-06-08: qty 2

## 2013-06-08 MED ORDER — HYDROMORPHONE HCL PF 1 MG/ML IJ SOLN
0.2500 mg | INTRAMUSCULAR | Status: DC | PRN
  Administered 2013-06-08 (×2): 0.5 mg via INTRAVENOUS

## 2013-06-08 MED ORDER — LACTATED RINGERS IV SOLN
INTRAVENOUS | Status: DC
  Administered 2013-06-08: 12:00:00 via INTRAVENOUS

## 2013-06-08 MED ORDER — ONDANSETRON HCL 4 MG/2ML IJ SOLN: 4.0000 mg | Freq: Four times a day (QID) | INTRAMUSCULAR | Status: DC | PRN

## 2013-06-08 MED ORDER — TOPIRAMATE 25 MG PO TABS: 50.0000 mg | ORAL_TABLET | Freq: Two times a day (BID) | ORAL | Status: DC

## 2013-06-08 MED ORDER — METOCLOPRAMIDE HCL 5 MG/ML IJ SOLN: 5.0000 mg | Freq: Three times a day (TID) | INTRAMUSCULAR | Status: DC | PRN

## 2013-06-08 MED ORDER — ONDANSETRON HCL 4 MG PO TABS: 4.0000 mg | ORAL_TABLET | Freq: Four times a day (QID) | ORAL | Status: DC | PRN

## 2013-06-08 MED ORDER — HYDROMORPHONE HCL PF 1 MG/ML IJ SOLN
INTRAMUSCULAR | Status: AC
  Filled 2013-06-08: qty 1

## 2013-06-08 MED ORDER — SODIUM CHLORIDE 0.9 % IR SOLN
Status: DC | PRN
  Administered 2013-06-08: 3000 mL

## 2013-06-08 MED ORDER — CYCLOBENZAPRINE HCL 10 MG PO TABS: 10.0000 mg | ORAL_TABLET | Freq: Three times a day (TID) | ORAL | Status: DC | PRN

## 2013-06-08 MED ORDER — PROPOFOL 10 MG/ML IV BOLUS
INTRAVENOUS | Status: DC | PRN
  Administered 2013-06-08: 200 mg via INTRAVENOUS

## 2013-06-08 MED ORDER — CLONIDINE HCL (ANALGESIA) 100 MCG/ML EP SOLN
150.0000 ug | EPIDURAL | Status: AC
  Administered 2013-06-08: 1 mL via INTRA_ARTICULAR
  Filled 2013-06-08: qty 1.5

## 2013-06-08 MED ORDER — BUPIVACAINE HCL (PF) 0.25 % IJ SOLN
INTRAMUSCULAR | Status: DC | PRN
  Administered 2013-06-08: 10 mL

## 2013-06-08 MED ORDER — MORPHINE SULFATE 2 MG/ML IJ SOLN: 1.0000 mg | INTRAMUSCULAR | Status: DC | PRN

## 2013-06-08 MED ORDER — POTASSIUM CHLORIDE IN NACL 20-0.9 MEQ/L-% IV SOLN
INTRAVENOUS | Status: DC
  Filled 2013-06-08 (×2): qty 1000

## 2013-06-08 MED ORDER — GLYCOPYRROLATE 0.2 MG/ML IJ SOLN
INTRAMUSCULAR | Status: DC | PRN
  Administered 2013-06-08: .8 mg via INTRAVENOUS

## 2013-06-08 MED ORDER — DICLOFENAC SODIUM 25 MG PO TBEC
25.0000 mg | DELAYED_RELEASE_TABLET | Freq: Two times a day (BID) | ORAL | Status: DC
  Administered 2013-06-08: 25 mg via ORAL
  Filled 2013-06-08 (×4): qty 1

## 2013-06-08 MED ORDER — ASPIRIN 325 MG PO TABS
325.0000 mg | ORAL_TABLET | Freq: Every day | ORAL | Status: DC
  Administered 2013-06-08 – 2013-06-09 (×2): 325 mg via ORAL
  Filled 2013-06-08 (×2): qty 1

## 2013-06-08 MED ORDER — PROMETHAZINE HCL 25 MG/ML IJ SOLN: 6.2500 mg | INTRAMUSCULAR | Status: DC | PRN

## 2013-06-08 MED ORDER — GABAPENTIN 100 MG PO CAPS
100.0000 mg | ORAL_CAPSULE | Freq: Two times a day (BID) | ORAL | Status: DC
  Administered 2013-06-08 – 2013-06-09 (×2): 100 mg via ORAL
  Filled 2013-06-08 (×3): qty 1

## 2013-06-08 MED ORDER — METOCLOPRAMIDE HCL 10 MG PO TABS: 5.0000 mg | ORAL_TABLET | Freq: Three times a day (TID) | ORAL | Status: DC | PRN

## 2013-06-08 MED ORDER — FENTANYL CITRATE 0.05 MG/ML IJ SOLN
INTRAMUSCULAR | Status: DC | PRN
Start: 2013-06-08 — End: 2013-06-08
  Administered 2013-06-08 (×3): 50 ug via INTRAVENOUS
  Administered 2013-06-08: 100 ug via INTRAVENOUS
  Administered 2013-06-08: 25 ug via INTRAVENOUS
  Administered 2013-06-08 (×2): 50 ug via INTRAVENOUS
  Administered 2013-06-08: 25 ug via INTRAVENOUS
  Administered 2013-06-08: 50 ug via INTRAVENOUS
  Administered 2013-06-08 (×2): 25 ug via INTRAVENOUS

## 2013-06-08 MED ORDER — TOPIRAMATE 25 MG PO TABS
50.0000 mg | ORAL_TABLET | Freq: Two times a day (BID) | ORAL | Status: DC
  Administered 2013-06-08 – 2013-06-09 (×2): 50 mg via ORAL
  Filled 2013-06-08 (×3): qty 2

## 2013-06-08 SURGICAL SUPPLY — 78 items
ANCH SUT PUSHLCK 19.5X3.5 STRL (Anchor) ×1 IMPLANT
ANCHOR PUSHLOCK PEEK 3.5X19.5 (Anchor) ×2 IMPLANT
BANDAGE ELASTIC 6 VELCRO ST LF (GAUZE/BANDAGES/DRESSINGS) ×2 IMPLANT
BANDAGE ESMARK 6X9 LF (GAUZE/BANDAGES/DRESSINGS) ×1 IMPLANT
BLADE CUDA 5.5 (BLADE) IMPLANT
BLADE GREAT WHITE 4.2 (BLADE) ×2 IMPLANT
BLADE SURG 10 STRL SS (BLADE) ×2 IMPLANT
BLADE SURG 15 STRL LF DISP TIS (BLADE) ×3 IMPLANT
BLADE SURG 15 STRL SS (BLADE) ×3
BNDG CMPR 9X6 STRL LF SNTH (GAUZE/BANDAGES/DRESSINGS) ×1
BNDG ELASTIC 6X15 VLCR STRL LF (GAUZE/BANDAGES/DRESSINGS) ×2 IMPLANT
BNDG ESMARK 6X9 LF (GAUZE/BANDAGES/DRESSINGS) ×2
BUR OVAL 6.0 (BURR) IMPLANT
CLOTH BEACON ORANGE TIMEOUT ST (SAFETY) ×2 IMPLANT
COVER SURGICAL LIGHT HANDLE (MISCELLANEOUS) ×2 IMPLANT
CUFF TOURNIQUET SINGLE 34IN LL (TOURNIQUET CUFF) ×2 IMPLANT
CUFF TOURNIQUET SINGLE 44IN (TOURNIQUET CUFF) IMPLANT
DECANTER SPIKE VIAL GLASS SM (MISCELLANEOUS) IMPLANT
DRAPE ARTHROSCOPY W/POUCH 114 (DRAPES) ×2 IMPLANT
DRAPE C-ARM 42X72 X-RAY (DRAPES) ×2 IMPLANT
DRAPE C-ARMOR (DRAPES) ×2 IMPLANT
DRAPE INCISE IOBAN 66X45 STRL (DRAPES) ×6 IMPLANT
DRAPE U-SHAPE 47X51 STRL (DRAPES) ×2 IMPLANT
DRSG PAD ABDOMINAL 8X10 ST (GAUZE/BANDAGES/DRESSINGS) ×6 IMPLANT
ELECT REM PT RETURN 9FT ADLT (ELECTROSURGICAL) ×2
ELECTRODE REM PT RTRN 9FT ADLT (ELECTROSURGICAL) ×1 IMPLANT
EVACUATOR 1/8 PVC DRAIN (DRAIN) IMPLANT
GAUZE XEROFORM 1X8 LF (GAUZE/BANDAGES/DRESSINGS) ×2 IMPLANT
GLOVE BIO SURGEON ST LM GN SZ9 (GLOVE) IMPLANT
GLOVE BIOGEL PI IND STRL 8 (GLOVE) ×1 IMPLANT
GLOVE BIOGEL PI IND STRL 9 (GLOVE) ×1 IMPLANT
GLOVE BIOGEL PI INDICATOR 8 (GLOVE) ×1
GLOVE BIOGEL PI INDICATOR 9 (GLOVE) ×1
GLOVE SURG ORTHO 8.0 STRL STRW (GLOVE) ×2 IMPLANT
GOWN PREVENTION PLUS LG XLONG (DISPOSABLE) ×2 IMPLANT
GOWN PREVENTION PLUS XLARGE (GOWN DISPOSABLE) ×2 IMPLANT
GOWN STRL NON-REIN LRG LVL3 (GOWN DISPOSABLE) ×6 IMPLANT
IMMOBILIZER KNEE 22 (SOFTGOODS) ×2 IMPLANT
KIT BASIN OR (CUSTOM PROCEDURE TRAY) ×2 IMPLANT
KIT BIO-TENODESIS 3X8 DISP (MISCELLANEOUS)
KIT INSRT BABSR STRL DISP BTN (MISCELLANEOUS) IMPLANT
KIT ROOM TURNOVER OR (KITS) ×2 IMPLANT
KIT TRANSTIBIAL (DISPOSABLE) IMPLANT
MANIFOLD NEPTUNE II (INSTRUMENTS) ×2 IMPLANT
NEEDLE 18GX1X1/2 (RX/OR ONLY) (NEEDLE) ×2 IMPLANT
NS IRRIG 1000ML POUR BTL (IV SOLUTION) ×2 IMPLANT
PACK ARTHROSCOPY DSU (CUSTOM PROCEDURE TRAY) ×2 IMPLANT
PACK IMPLANT BIOCOMPOSITE MPFL (Orthopedic Implant) ×2 IMPLANT
PAD ARMBOARD 7.5X6 YLW CONV (MISCELLANEOUS) ×4 IMPLANT
PAD CAST 4YDX4 CTTN HI CHSV (CAST SUPPLIES) ×1 IMPLANT
PADDING CAST COTTON 4X4 STRL (CAST SUPPLIES) ×2
PADDING CAST COTTON 6X4 STRL (CAST SUPPLIES) ×4 IMPLANT
PASSER SUT SWANSON 36MM LOOP (INSTRUMENTS) IMPLANT
PENCIL BUTTON HOLSTER BLD 10FT (ELECTRODE) ×2 IMPLANT
SET ARTHROSCOPY TUBING (MISCELLANEOUS) ×2
SET ARTHROSCOPY TUBING LN (MISCELLANEOUS) ×1 IMPLANT
SPONGE GAUZE 4X4 12PLY (GAUZE/BANDAGES/DRESSINGS) ×4 IMPLANT
SPONGE LAP 4X18 X RAY DECT (DISPOSABLE) ×4 IMPLANT
SPONGE SCRUB IODOPHOR (GAUZE/BANDAGES/DRESSINGS) ×2 IMPLANT
SUCTION FRAZIER TIP 10 FR DISP (SUCTIONS) ×2 IMPLANT
SUT 2 FIBERLOOP 20 STRT BLUE (SUTURE) ×2
SUT ETHILON 3 0 PS 1 (SUTURE) IMPLANT
SUT FIBERWIRE #2 38 T-5 BLUE (SUTURE) ×2
SUT PROLENE 3 0 PS 2 (SUTURE) IMPLANT
SUT VIC AB 0 CT1 27 (SUTURE) ×4
SUT VIC AB 0 CT1 27XBRD ANBCTR (SUTURE) ×2 IMPLANT
SUT VIC AB 2-0 CT1 27 (SUTURE) ×2
SUT VIC AB 2-0 CT1 TAPERPNT 27 (SUTURE) ×1 IMPLANT
SUTURE 2 FIBERLOOP 20 STRT BLU (SUTURE) ×1 IMPLANT
SUTURE FIBERWR #2 38 T-5 BLUE (SUTURE) ×1 IMPLANT
SYR 30ML LL (SYRINGE) ×2 IMPLANT
SYR BULB IRRIGATION 50ML (SYRINGE) ×2 IMPLANT
SYR TB 1ML LUER SLIP (SYRINGE) ×2 IMPLANT
TOWEL OR 17X24 6PK STRL BLUE (TOWEL DISPOSABLE) ×2 IMPLANT
TOWEL OR 17X26 10 PK STRL BLUE (TOWEL DISPOSABLE) ×2 IMPLANT
UNDERPAD 30X30 INCONTINENT (UNDERPADS AND DIAPERS) ×2 IMPLANT
WATER STERILE IRR 1000ML POUR (IV SOLUTION) ×2 IMPLANT
WRAP KNEE MAXI GEL POST OP (GAUZE/BANDAGES/DRESSINGS) IMPLANT

## 2013-06-08 NOTE — Brief Op Note (Signed)
06/08/2013  4:56 PM  PATIENT:  [name]  38 y.o. female  PRE-OPERATIVE DIAGNOSIS:  LEFT KNEE PATELLA INSTABILITY  POST-OPERATIVE DIAGNOSIS:  left knee patella instability  PROCEDURE:  Procedure(s): DIAGNOSTIC OPERATIVE ARTHROSCOPY - patella debridement, OPEN MEDIAL PATELLA FEMORAL LIGAMENT RECONSTRUCTION hamsrring autio graft  SURGEON:  Surgeon(s): Meredith Pel, MD  ASSISTANT: s vernon  ANESTHESIA:   general  EBL: 75 ml    Total I/O In: 1500 [I.V.:1500] Out: 50 [Blood:50]  BLOOD ADMINISTERED: none  DRAINS: none   LOCAL MEDICATIONS USED:  mso4 marcaine - clonidine  SPECIMEN:  No Specimen  COUNTS:  YES  TOURNIQUET:   Total Tourniquet Time Documented: Thigh (Left) - 104 minutes Total: Thigh (Left) - 104 minutes   DICTATION: .Other Dictation: Dictation Number [id number]  PLAN OF CARE: Admit for overnight observation  PATIENT DISPOSITION:  PACU - hemodynamically stable

## 2013-06-08 NOTE — Transfer of Care (Signed)
Immediate Anesthesia Transfer of Care Note  Patient: [name]  Procedure(s) Performed: Procedure(s) with comments: DIAGNOSTIC OPERATIVE ARTHROSCOPY, OPEN MEDIAL PATELLA FEMORAL LIGAMENT RECONSTRUCTION (Left) - with Hamstring autograft  Patient Location: PACU  Anesthesia Type:General  Level of Consciousness: awake and alert   Airway & Oxygen Therapy: Patient Spontanous Breathing and Patient connected to nasal cannula oxygen  Post-op Assessment: Report given to PACU RN, Post -op Vital signs reviewed and stable and Patient moving all extremities X 4  Post vital signs: Reviewed and stable  Complications: No apparent anesthesia complications

## 2013-06-08 NOTE — Anesthesia Preprocedure Evaluation (Addendum)
Anesthesia Evaluation  Patient identified by MRN, date of birth, ID band Patient awake    Reviewed: Allergy & Precautions, H&P , NPO status , Patient's Chart, lab work & pertinent test results  Airway Mallampati: II TM Distance: >3 FB Neck ROM: Full  Mouth opening: Limited Mouth Opening  Dental  (+) Dental Advisory Given, Edentulous Upper and Edentulous Lower   Pulmonary Current Smoker,          Cardiovascular hypertension, Pt. on medications     Neuro/Psych  Headaches,    GI/Hepatic   Endo/Other  Morbid obesity  Renal/GU      Musculoskeletal   Abdominal   Peds  Hematology  (+) anemia ,   Anesthesia Other Findings   Reproductive/Obstetrics                          Anesthesia Physical Anesthesia Plan  ASA: II  Anesthesia Plan: General   Post-op Pain Management:    Induction: Intravenous  Airway Management Planned: Oral ETT  Additional Equipment:   Intra-op Plan:   Post-operative Plan: Extubation in OR  Informed Consent: I have reviewed the patients History and Physical, chart, labs and discussed the procedure including the risks, benefits and alternatives for the proposed anesthesia with the patient or authorized representative who has indicated his/her understanding and acceptance.   Dental advisory given  Plan Discussed with: CRNA, Anesthesiologist and Surgeon  Anesthesia Plan Comments:         Anesthesia Quick Evaluation

## 2013-06-08 NOTE — H&P (Signed)
[name] is an 38 y.o. female.   Chief Complaint: Left knee pain and instability HPI: [name] is a 38 year old female with left knee pain and instability. 10 years ago she had medial patellofemoral ligament repair. She did well until several years ago when she developed recurrent anterior patellar instability on the left-hand side. She now reports continued symptoms which is been refractory to nonoperative management including quad strengthening bracing and cortisone injections. This patellar instability interferes with her ADLs. There is a family history of DVT or pulmonary embolism  Past Medical History  Diagnosis Date  . Sebaceous cyst   . Abscess   . Anemia   . Hypertension   . Headache(784.0)     migraines    Past Surgical History  Procedure Laterality Date  . Partial hysterectomy    . Cholecystectomy open    . Ovarian cyst removal    . R arm surgery    . Gastric bypass  2011    gastric sleeve  . Knee surgery  2005    left  . Cystectomy      2004 axilla bil  . Abdominal hysterectomy    . Fracture surgery      rt arm    Family History  Problem Relation Age of Onset  . Cancer Maternal Grandfather     lung   Social History:  reports that she has been smoking Cigarettes.  She has a 2.5 pack-year smoking history. She has never used smokeless tobacco. She reports that she does not drink alcohol or use illicit drugs.  Allergies:  Allergies  Allergen Reactions  . Penicillins Rash    Face and throat swell    No prescriptions prior to admission    No results found for this or any previous visit (from the past 48 hour(s)). No results found.  Review of Systems  Constitutional: Negative.   HENT: Negative.   Eyes: Negative.   Respiratory: Negative.   Cardiovascular: Negative.   Gastrointestinal: Negative.   Genitourinary: Negative.   Musculoskeletal: Positive for joint pain.  Skin: Negative.   Neurological: Negative.   Psychiatric/Behavioral: Negative.      There were no vitals taken for this visit. Physical Exam  Constitutional: She appears well-developed.  HENT:  Head: Normocephalic.  Eyes: Pupils are equal, round, and reactive to light.  Neck: Normal range of motion.  Cardiovascular: Normal rate.   Respiratory: Effort normal.  Neurological: She is alert.  Skin: Skin is warm.  Psychiatric: She has a normal mood and affect.   left knee demonstrates neutral alignment no increased Q angle palpable pedal pulses intact extensor mechanism lateral mobility is increased and patellar apprehension is present collateral cruciate ligaments are stable no joint line tenderness is present prior incisions well-healed over the patella   Assessment/Plan Impression is recurrent left knee patellar instability following medial patellofemoral ligament repair 10 years ago. MRI scan is consistent with patellar instability. This is affecting her ADLs and she is failed nonoperative management. She presents now for medial patellofemoral ligament reconstruction using [place] hamstring autograft. Risk and benefits discussed with the patient including but not limited to infection recurrent instability knee stiffness prolonged rehabilitation. All questions answered.  Amman Bartel SCOTT 06/08/2013, 7:32 AM

## 2013-06-08 NOTE — Preoperative (Signed)
Beta Blockers   Reason not to administer Beta Blockers:Not Applicable 

## 2013-06-08 NOTE — Anesthesia Postprocedure Evaluation (Signed)
  Anesthesia Post-op Note  Patient: [name]  Procedure(s) Performed: Procedure(s) with comments: DIAGNOSTIC OPERATIVE ARTHROSCOPY, OPEN MEDIAL PATELLA FEMORAL LIGAMENT RECONSTRUCTION (Left) - with Hamstring autograft  Patient Location: PACU  Anesthesia Type:General  Level of Consciousness: awake  Airway and Oxygen Therapy: Patient Spontanous Breathing  Post-op Pain: mild  Post-op Assessment: Post-op Vital signs reviewed, Patient's Cardiovascular Status Stable, Respiratory Function Stable, Patent Airway, No signs of Nausea or vomiting and Pain level controlled  Post-op Vital Signs: Reviewed and stable  Complications: No apparent anesthesia complications

## 2013-06-08 NOTE — Anesthesia Procedure Notes (Signed)
Procedure Name: Intubation Date/Time: 06/08/2013 1:35 PM Performed by: Blair Heys E Pre-anesthesia Checklist: Patient identified, Emergency Drugs available, Suction available and Patient being monitored Patient Re-evaluated:Patient Re-evaluated prior to inductionOxygen Delivery Method: Circle system utilized Preoxygenation: Pre-oxygenation with 100% oxygen Intubation Type: IV induction Ventilation: Mask ventilation without difficulty Laryngoscope Size: Miller and 2 Grade View: Grade I Tube type: Oral Tube size: 7.5 mm Number of attempts: 1 Airway Equipment and Method: Stylet Placement Confirmation: ETT inserted through vocal cords under direct vision,  positive ETCO2 and breath sounds checked- equal and bilateral Secured at: 22 cm Tube secured with: Tape Dental Injury: Teeth and Oropharynx as per pre-operative assessment

## 2013-06-09 MED ORDER — OXYCODONE HCL 5 MG PO TABS: 5.0000 mg | ORAL_TABLET | ORAL | Status: DC | PRN

## 2013-06-09 MED ORDER — ASPIRIN 325 MG PO TABS: 325.0000 mg | ORAL_TABLET | Freq: Every day | ORAL | Status: DC

## 2013-06-09 NOTE — Progress Notes (Signed)
Physical Therapy Evaluation   06/09/13 0947  PT Visit Information  Last PT Received On 06/09/13  Assistance Needed +1  History of Present Illness Pt is a 38 y/o female admitted s/p medial patella femoral ligament reconstruction.   Precautions  Precautions Fall  Precaution Comments Knee immobilizer on when walking, okay for ROM exercises of the knee  Required Braces or Orthoses Knee Immobilizer - Left  Knee Immobilizer - Left On when out of bed or walking  Restrictions  Weight Bearing Restrictions Yes  LLE Weight Bearing WBAT  Home Living  Family/patient expects to be discharged to: Private residence  Living Arrangements Alone (Daughter to stay with pt)  Available Help at Discharge Family;Available PRN/intermittently  Type of Home House  Home Access Stairs to enter  Entrance Stairs-Number of Steps 1  Entrance Stairs-Rails None  Home Layout One level  Home Equipment Crutches  Prior Function  Level of Independence Independent  Communication  Communication No difficulties  Cognition  Arousal/Alertness Awake/alert  Behavior During Therapy WFL for tasks assessed/performed  Overall Cognitive Status Within Functional Limits for tasks assessed  Upper Extremity Assessment  Upper Extremity Assessment Overall WFL for tasks assessed  Lower Extremity Assessment  Lower Extremity Assessment Overall WFL for tasks assessed;LLE deficits/detail  LLE Deficits / Details Decreased strength and AROM consistent with above mentioned procedure.   LLE Unable to fully assess due to pain  Cervical / Trunk Assessment  Cervical / Trunk Assessment Normal  Bed Mobility  General bed mobility comments NT - pt received up in chair  Transfers  Overall transfer level Needs assistance  Equipment used Rolling walker (2 wheeled)  Transfers Sit to/from Stand  Sit to Stand Supervision  General transfer comment VC's for hand placement on seated surface for safety.   Ambulation/Gait  Ambulation/Gait assistance  Min guard  Ambulation Distance (Feet) 100 Feet  Assistive device Rolling walker (2 wheeled)  Gait Pattern/deviations Step-through pattern;Decreased stride length  Gait velocity Decreased  Gait velocity interpretation Below normal speed for age/gender  Balance  Overall balance assessment No apparent balance deficits (not formally assessed)  Exercises  Exercises General Lower Extremity  General Exercises - Lower Extremity  Ankle Circles/Pumps 10 reps  Quad Sets 10 reps  PT - End of Session  Equipment Utilized During Treatment Gait belt;Left knee immobilizer  Activity Tolerance Patient tolerated treatment well  Patient left in chair;with call bell/phone within reach;with family/visitor present  Nurse Communication Mobility status  PT Assessment  PT Recommendation/Assessment Patient needs continued PT services  PT Problem List Decreased strength;Decreased range of motion;Decreased activity tolerance;Decreased balance;Decreased mobility;Decreased knowledge of use of DME;Decreased safety awareness;Pain  PT Therapy Diagnosis  Difficulty walking;Acute pain  PT Plan  PT Frequency Min 5X/week  PT Treatment/Interventions DME instruction;Gait training;Stair training;Functional mobility training;Therapeutic activities;Therapeutic exercise;Neuromuscular re-education;Patient/family education  PT Recommendation  Follow Up Recommendations Outpatient PT (When appropriate per post-op protocol)  PT equipment Rolling walker with 5" wheels;3in1 (PT)  Individuals Consulted  Consulted and Agree with Results and Recommendations Patient  Acute Rehab PT Goals  Patient Stated Goal To return home  PT Goal Formulation With patient  Time For Goal Achievement 06/23/13  Potential to Achieve Goals Good  PT Time Calculation  PT Start Time 0945  PT Stop Time 1013  PT Time Calculation (min) 28 min  PT G-Codes **NOT FOR INPATIENT CLASS**  Functional Assessment Tool Used Clinical judgement  Functional Limitation  Mobility: Walking and moving around  Mobility: Walking and Moving Around Current Status (W9604) CK  Mobility:  Walking and Moving Around Goal Status [phone]) CL  PT General Charges  $$ ACUTE PT VISIT 1 Procedure  PT Evaluation  $Initial PT Evaluation Tier I 1 Procedure  PT Treatments  $Gait Training 8-22 mins  Written Expression  Dominant Hand Right  Assessment: This patient presents with acute pain and decreased functional independence following the above mentioned procedure. At the time of PT eval, pt was able to ambulate well with the RW and practiced stair negotiation with husband. Will keep on acute care PT caseload until d/c for further gait training and strengthening.  Jolyn Lent, [place], DPT [phone]

## 2013-06-09 NOTE — Op Note (Signed)
[name], WO[place]         ACCOUNT NO.:  [id number]  MEDICAL RECORD NO.:  [id number]  LOCATION:                                 FACILITY:  PHYSICIAN:  Anderson Malta, M.D.    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  PROCEDURES:  Arthroscopy with patellar debridement, open medial patellofemoral ligament, reconstruction using gracilis autograft.  SURGEON:  Anderson Malta, M.D.  ASSISTANT:  Epimenio Foot, P.A.  ANESTHESIA:  General endotracheal.  ESTIMATED BLOOD LOSS:  75 mL.  DRAINS:  None.  TOURNIQUET TIME:  104 minutes at 300 mmHg.  INDICATIONS:  [name] is a patient with left knee patellar instability, recurrent who presents for operative management and failed conservative management after explanation of risks and benefits.  PROCEDURE IN DETAIL:  The patient was brought to operating room where general endotracheal anesthesia was induced.  Preop antibiotics were administered.  Time-out was called.  Left leg was prescrubbed with alcohol and Betadine, which was allowed to air dry.  Prepped with DuraPrep solution and draped in sterile manner.  Time-out was called. Left leg was examined under anesthesia and found to have good stability to varus-valgus stress.  ACL and PCL intact.  Basically, I can dislocate the patella laterally with medial border of the patella, coming to the lateral border of the medial femoral condyle.  At this time, [name] was used to cover the operative field.  Leg was elevated and exsanguinated with an Esmarch wrap.  Tourniquet was inflated.  Graft was initially harvested making an incision just medial and inferior to the tibial tubercle.  Skin and  subcutaneous tissues were sharply divided.  Gracilis tendon was palpated and harvested.  The surrounding tendons were remained intact.  Gracilis tendon was harvested with care being taken to avoid injury to the saphenous nerve.  This prepared on the back table to a length of approximately 230 mm by Epimenio Foot.  FiberWire loop placed in each pin.  Concurrently, diagnostic arthroscopy was performed. Anterior, inferior, lateral and anterior, inferior, medial portals were established.  Diagnostic arthroscopy was performed.  The patient did have some grade 1 chondromalacia of the medial femoral condyle.  Medial meniscus was intact.  ACL and PCL intact.  Lateral compartment did have some lateral-sided wear from patellar dislocations as well as undersurface wear of the patella, grade 2 chondromalacia benign down to subchondral bone.  Debridement was performed on the undersurface of patella at the apex as well as on the lateral femoral condyle and medial femoral condyle loose chondral fragments, which were present, but minimal.  Following diagnostic arthroscopy, instruments were removed. Prior incision, the medial aspect of the patella was utilized.  Skin and subcutaneous tissue sharply divided.  Plane developed between layer 2 and 3 above the capsule without side of the joint.  This was dissected bluntly down to the medial epicondyle area.  At this time, prior sutures were removed.  No suture anchors were placed.  Under fluoroscopic guidance, 2  guide pins were drilled into the patella spaced about at least 15-20 mm apart in the superior aspect of the patella.  At this time, it did not reveal full 5 cortical thickness, but only drilled approximately 20-25 mm.  Concurrently using Arthrex guide, anticipated location of the attachment site of the MPFL was located.  Incision was made in this area and care was taken to avoid injury to saphenous nerve and its branches.  Dissection was taken down  to the area just anterior and distal to the adductor tubercle.  Guide pin was placed in the anatomic location of the MPFL footprint.  Isometry was tested with the pin in the femur and two pins in the patella.  Approximate length of the graft was reported at this time and prepared accordingly.  Good tension from 0-30 was noted with about half a quadrant mobility laterally, then laxity with flexion in accordance with normal MPFL isometry.  At this time, the graft was fixed into the patella and using SwiveLock anchors. It was then fixed into the femur in 30 degrees of flexion on the lateral aspect of the trochlear groove.  This had backup fixation on the lateral side using PushLock.  Good fixation was achieved.  Good range of motion achieved without over constraint.  The patient did not have her patella dislocatable.  At this time, tourniquet was released.  All incisions were thoroughly irrigated.  Harvest incision closed using interrupted inverted 0 Vicryl suture, 2-0 Vicryl suture, and 3-0 Prolene.  The 2 medial incisions were irrigated and closed using 0 Vicryl suture, 2-0 Vicryl suture, and 3-0 Prolene.  PushLock incision was closed using 0 Vicryl, 2-0 Vicryl, and 3-0 nylon.  Portals were closed using 3-0 nylon. Solution of Marcaine, morphine, clonidine injected into the knee. Incisions were then covered with Steri-Strips, Xeroform, and bulky dressing, knee immobilizer.  Epimenio Foot required all times during the case for retraction and graft preparation, her assistance was a medical necessity.     Anderson Malta, M.D.     GSD/MEDQ  D:  06/08/2013  T:  06/09/2013  Job:  [id number]

## 2013-06-09 NOTE — Progress Notes (Signed)
Subjective: Pt stable - pain controlled   Objective: Vital signs in last 24 hours: Temp:  [98 F (36.7 C)-100.1 F (37.8 C)] 100.1 F (37.8 C) (01/21 0627) Pulse Rate:  [63-90] 90 (01/21 0627) Resp:  [10-20] 16 (01/21 0627) BP: (118-166)/(63-95) 119/64 mmHg (01/21 0627) SpO2:  [92 %-100 %] 99 % (01/21 0627)  Intake/Output from previous day: 01/20 0701 - 01/21 0700 In: 2880 [P.O.:480; I.V.:2400] Out: 50 [Blood:50] Intake/Output this shift:    Exam:  Sensation intact distally Intact pulses distally Dorsiflexion/Plantar flexion intact  Labs: No results found for this basename: HGB,  in the last 72 hours No results found for this basename: WBC, RBC, HCT, PLT,  in the last 72 hours No results found for this basename: NA, K, CL, CO2, BUN, CREATININE, GLUCOSE, CALCIUM,  in the last 72 hours No results found for this basename: LABPT, INR,  in the last 72 hours  Assessment/Plan: Plan dc after PT   Stephano Arrants SCOTT 06/09/2013, 7:20 AM

## 2013-06-10 ENCOUNTER — Encounter (HOSPITAL_COMMUNITY): Payer: Self-pay | Admitting: Orthopedic Surgery

## 2013-06-24 NOTE — Discharge Summary (Signed)
Physician Discharge Summary  Patient ID: [name] MRN: [id number] DOB/AGE: [birth year] 38 y.o.  Admit date: 06/08/2013 Discharge date: 06/09/2013 Admission Diagnoses:  Active Problems:   Patellar instability of left knee   Discharge Diagnoses:  Same  Surgeries: Procedure(s): DIAGNOSTIC OPERATIVE ARTHROSCOPY, OPEN MEDIAL PATELLA FEMORAL LIGAMENT RECONSTRUCTION on 06/08/2013   Consultants:    Discharged Condition: Stable  Hospital Course: MALON IGNATIUS is an 38 y.o. female who was admitted 06/08/2013 with a chief complaint of left knee pain, and found to have a diagnosis of left knee patellar instability.  They were brought to the operating room on 06/08/2013 and underwent the above named procedures.She tolerated well and was ambulating with PT at time of discharge    Antibiotics given:  Anti-infectives   Start     Dose/Rate Route Frequency Ordered Stop   06/08/13 2100  clindamycin (CLEOCIN) IVPB 600 mg     600 mg 100 mL/hr over 30 Minutes Intravenous 3 times per day 06/08/13 1955 06/09/13 0620   06/08/13 0600  clindamycin (CLEOCIN) IVPB 900 mg     900 mg 100 mL/hr over 30 Minutes Intravenous On call to O.R. 06/07/13 1415 06/08/13 1340    .  Recent vital signs:  Filed Vitals:   06/09/13 1431  BP: 152/92  Pulse: 88  Temp: 100.6 F (38.1 C)  Resp: 16    Recent laboratory studies:  Results for orders placed during the hospital encounter of 06/04/13  BASIC METABOLIC PANEL      Result Value Range   Sodium 146  137 - 147 mEq/L   Potassium 3.6 (*) 3.7 - 5.3 mEq/L   Chloride 109  96 - 112 mEq/L   CO2 27  19 - 32 mEq/L   Glucose, Bld 83  70 - 99 mg/dL   BUN 13  6 - 23 mg/dL   Creatinine, Ser 4.09  0.50 - 1.10 mg/dL   Calcium 8.5  8.4 - 81.1 mg/dL   GFR calc non Af Amer >90  >90 mL/min   GFR calc Af Amer >90  >90 mL/min  CBC      Result Value Range   WBC 5.6  4.0 - 10.5 K/uL   RBC 4.42  3.87 - 5.11 MIL/uL   Hemoglobin 15.1 (*) 12.0 - 15.0 g/dL   HCT 91.4  78.2 - 95.6 %   MCV 97.5  78.0 - 100.0 fL   MCH 34.2 (*) 26.0 - 34.0 pg   MCHC 35.0  30.0 - 36.0 g/dL   RDW 21.3  08.6 - 57.8 %   Platelets 234  150 - 400 K/uL    Discharge Medications:     Medication List         aspirin 325 MG tablet  Take 1 tablet (325 mg total) by mouth daily.     cyclobenzaprine 10 MG tablet  Commonly known as:  FLEXERIL  Take 10 mg by mouth 3 (three) times daily as needed for muscle spasms.     diclofenac 25 MG EC tablet  Commonly known as:  VOLTAREN  Take 25 mg by mouth 2 (two) times daily.     gabapentin 100 MG capsule  Commonly known as:  NEURONTIN  Take 100 mg by mouth 2 (two) times daily.     hydrochlorothiazide 25 MG tablet  Commonly known as:  HYDRODIURIL  Take 25 mg by mouth daily.     LORazepam 1 MG tablet  Commonly known as:  ATIVAN  Take 1-2 tablets (1-2 mg  total) by mouth at bedtime as needed for sleep.     oxyCODONE 5 MG immediate release tablet  Commonly known as:  Oxy IR/ROXICODONE  Take 1-2 tablets (5-10 mg total) by mouth every 3 (three) hours as needed for breakthrough pain.     topiramate 50 MG tablet  Commonly known as:  TOPAMAX  Take 50 mg by mouth 2 (two) times daily.     VITAMIN B 12 PO  Take 1,000 mg by mouth daily.        Diagnostic Studies: Dg Knee 1-2 Views Left  06/21/13   CLINICAL DATA:  Left patellofemoral ligament repair  EXAM: DG C-ARM 1-60 MIN; LEFT KNEE - 1-2 VIEW  COMPARISON:  Prior knee MRI 12/01/2012  FINDINGS: Frontal and lateral spot radiographs obtained during operative repair of the left knee. The images demonstrate pins within the patella from a medial approach, as well as a soft tissue spreaders overlying the femoral metaphysis laterally.  IMPRESSION: Intraoperative spot radiographs as above.   Electronically Signed   By: Malachy Moan M.D.   On: 06/21/13 16:45   Dg C-arm 1-60 Min  Jun 21, 2013   CLINICAL DATA:  Left patellofemoral ligament repair  EXAM: DG C-ARM 1-60 MIN; LEFT KNEE  - 1-2 VIEW  COMPARISON:  Prior knee MRI 12/01/2012  FINDINGS: Frontal and lateral spot radiographs obtained during operative repair of the left knee. The images demonstrate pins within the patella from a medial approach, as well as a soft tissue spreaders overlying the femoral metaphysis laterally.  IMPRESSION: Intraoperative spot radiographs as above.   Electronically Signed   By: Malachy Moan M.D.   On: 21-Jun-2013 16:45    Disposition: 01-Home or Self Care      Discharge Orders   Future Orders Complete By Expires   Call MD / Call 911  As directed    Comments:     If you experience chest pain or shortness of breath, CALL 911 and be transported to the hospital emergency room.  If you develope a fever above 101 F, pus (white drainage) or increased drainage or redness at the wound, or calf pain, call your surgeon's office.   Constipation Prevention  As directed    Comments:     Drink plenty of fluids.  Prune juice may be helpful.  You may use a stool softener, such as Colace (over the counter) 100 mg twice a day.  Use MiraLax (over the counter) for constipation as needed.   Diet - low sodium heart healthy  As directed    Discharge instructions  As directed    Comments:     Weight bearing as tolerated with walker and knee immobilizer OK for range of motion of knee Change dressing Thursday - keep incisions dry OK for straight leg raises OK to not use knee immobilizer when not walking   Increase activity slowly as tolerated  As directed          Signed: Deshane Cotroneo SCOTT 06/24/2013, 8:19 AM

## 2013-06-29 NOTE — Addendum Note (Signed)
Addendum created 06/29/13 1457 by Laurie Panda, MD   Modules edited: Anesthesia Responsible Staff

## 2013-06-30 ENCOUNTER — Ambulatory Visit: Payer: Medicaid Other | Attending: Orthopedic Surgery | Admitting: Physical Therapy

## 2013-06-30 DIAGNOSIS — IMO0001 Reserved for inherently not codable concepts without codable children: Secondary | ICD-10-CM | POA: Insufficient documentation

## 2013-06-30 DIAGNOSIS — R609 Edema, unspecified: Secondary | ICD-10-CM | POA: Insufficient documentation

## 2013-06-30 DIAGNOSIS — M25569 Pain in unspecified knee: Secondary | ICD-10-CM | POA: Insufficient documentation

## 2013-06-30 DIAGNOSIS — M25669 Stiffness of unspecified knee, not elsewhere classified: Secondary | ICD-10-CM | POA: Insufficient documentation

## 2013-06-30 DIAGNOSIS — I10 Essential (primary) hypertension: Secondary | ICD-10-CM | POA: Insufficient documentation

## 2013-07-07 ENCOUNTER — Ambulatory Visit: Payer: Medicaid Other | Admitting: Physical Therapy

## 2013-07-12 ENCOUNTER — Ambulatory Visit: Payer: Medicaid Other | Admitting: Physical Therapy

## 2013-08-05 ENCOUNTER — Emergency Department (HOSPITAL_BASED_OUTPATIENT_CLINIC_OR_DEPARTMENT_OTHER)
Admission: EM | Admit: 2013-08-05 | Discharge: 2013-08-05 | Disposition: A | Discharge status: Home / Self Care | Payer: Medicaid Other | Attending: Emergency Medicine | Admitting: Emergency Medicine

## 2013-08-05 ENCOUNTER — Emergency Department (HOSPITAL_BASED_OUTPATIENT_CLINIC_OR_DEPARTMENT_OTHER): Payer: Medicaid Other

## 2013-08-05 ENCOUNTER — Encounter (HOSPITAL_BASED_OUTPATIENT_CLINIC_OR_DEPARTMENT_OTHER): Payer: Self-pay | Admitting: Emergency Medicine

## 2013-08-05 DIAGNOSIS — I10 Essential (primary) hypertension: Secondary | ICD-10-CM | POA: Insufficient documentation

## 2013-08-05 DIAGNOSIS — R51 Headache: Secondary | ICD-10-CM | POA: Insufficient documentation

## 2013-08-05 DIAGNOSIS — R55 Syncope and collapse: Secondary | ICD-10-CM | POA: Insufficient documentation

## 2013-08-05 DIAGNOSIS — Z862 Personal history of diseases of the blood and blood-forming organs and certain disorders involving the immune mechanism: Secondary | ICD-10-CM | POA: Insufficient documentation

## 2013-08-05 DIAGNOSIS — Z872 Personal history of diseases of the skin and subcutaneous tissue: Secondary | ICD-10-CM | POA: Insufficient documentation

## 2013-08-05 DIAGNOSIS — F17200 Nicotine dependence, unspecified, uncomplicated: Secondary | ICD-10-CM | POA: Insufficient documentation

## 2013-08-05 DIAGNOSIS — Z880 Allergy status to penicillin: Secondary | ICD-10-CM | POA: Insufficient documentation

## 2013-08-05 DIAGNOSIS — R1011 Right upper quadrant pain: Secondary | ICD-10-CM | POA: Insufficient documentation

## 2013-08-05 DIAGNOSIS — Z7982 Long term (current) use of aspirin: Secondary | ICD-10-CM | POA: Insufficient documentation

## 2013-08-05 DIAGNOSIS — R109 Unspecified abdominal pain: Secondary | ICD-10-CM

## 2013-08-05 DIAGNOSIS — G43909 Migraine, unspecified, not intractable, without status migrainosus: Secondary | ICD-10-CM | POA: Insufficient documentation

## 2013-08-05 DIAGNOSIS — Z79899 Other long term (current) drug therapy: Secondary | ICD-10-CM | POA: Insufficient documentation

## 2013-08-05 DIAGNOSIS — R42 Dizziness and giddiness: Secondary | ICD-10-CM | POA: Insufficient documentation

## 2013-08-05 LAB — CBC WITH DIFFERENTIAL/PLATELET
BASOS ABS: 0 10*3/uL (ref 0.0–0.1)
BASOS PCT: 0 % (ref 0–1)
EOS ABS: 0.1 10*3/uL (ref 0.0–0.7)
Eosinophils Relative: 2 % (ref 0–5)
HCT: 41.8 % (ref 36.0–46.0)
Hemoglobin: 14.2 g/dL (ref 12.0–15.0)
Lymphocytes Relative: 46 % (ref 12–46)
Lymphs Abs: 2.7 10*3/uL (ref 0.7–4.0)
MCH: 34.4 pg — AB (ref 26.0–34.0)
MCHC: 34 g/dL (ref 30.0–36.0)
MCV: 101.2 fL — ABNORMAL HIGH (ref 78.0–100.0)
Monocytes Absolute: 0.4 10*3/uL (ref 0.1–1.0)
Monocytes Relative: 7 % (ref 3–12)
NEUTROS ABS: 2.7 10*3/uL (ref 1.7–7.7)
Neutrophils Relative %: 45 % (ref 43–77)
PLATELETS: 253 10*3/uL (ref 150–400)
RBC: 4.13 MIL/uL (ref 3.87–5.11)
RDW: 13.1 % (ref 11.5–15.5)
WBC: 5.9 10*3/uL (ref 4.0–10.5)

## 2013-08-05 LAB — COMPREHENSIVE METABOLIC PANEL
ALK PHOS: 79 U/L (ref 39–117)
ALT: 12 U/L (ref 0–35)
AST: 17 U/L (ref 0–37)
Albumin: 2.9 g/dL — ABNORMAL LOW (ref 3.5–5.2)
BILIRUBIN TOTAL: 0.5 mg/dL (ref 0.3–1.2)
BUN: 4 mg/dL — ABNORMAL LOW (ref 6–23)
CHLORIDE: 104 meq/L (ref 96–112)
CO2: 32 mEq/L (ref 19–32)
Calcium: 8.3 mg/dL — ABNORMAL LOW (ref 8.4–10.5)
Creatinine, Ser: 0.8 mg/dL (ref 0.50–1.10)
GFR calc Af Amer: >90 mL/min (ref >90)
GFR calc non Af Amer: >90 mL/min (ref >90)
Glucose, Bld: 98 mg/dL (ref 70–99)
POTASSIUM: 3.2 meq/L — AB (ref 3.7–5.3)
SODIUM: 146 meq/L (ref 137–147)
TOTAL PROTEIN: 6.3 g/dL (ref 6.0–8.3)

## 2013-08-05 LAB — URINALYSIS, ROUTINE W REFLEX MICROSCOPIC
GLUCOSE, UA: NEGATIVE mg/dL
Hgb urine dipstick: NEGATIVE
KETONES UR: 15 mg/dL — AB
LEUKOCYTES UA: NEGATIVE
NITRITE: NEGATIVE
Protein, ur: NEGATIVE mg/dL
SPECIFIC GRAVITY, URINE: 1.031 — AB (ref 1.005–1.030)
Urobilinogen, UA: 1 mg/dL (ref 0.0–1.0)
pH: 5.5 (ref 5.0–8.0)

## 2013-08-05 LAB — LIPASE, BLOOD: LIPASE: 60 U/L — AB (ref 11–59)

## 2013-08-05 MED ORDER — SODIUM CHLORIDE 0.9 % IV SOLN
1000.0000 mL | Freq: Once | INTRAVENOUS | Status: AC
  Administered 2013-08-05: 1000 mL via INTRAVENOUS

## 2013-08-05 MED ORDER — SODIUM CHLORIDE 0.9 % IV SOLN: 1000.0000 mL | Freq: Once | INTRAVENOUS | Status: DC

## 2013-08-05 MED ORDER — ONDANSETRON 4 MG PO TBDP: 4.0000 mg | ORAL_TABLET | Freq: Three times a day (TID) | ORAL | Status: DC | PRN

## 2013-08-05 MED ORDER — MORPHINE SULFATE 4 MG/ML IJ SOLN
4.0000 mg | Freq: Once | INTRAMUSCULAR | Status: AC
  Administered 2013-08-05: 4 mg via INTRAVENOUS
  Filled 2013-08-05: qty 1

## 2013-08-05 MED ORDER — HYDROCODONE-ACETAMINOPHEN 5-325 MG PO TABS: 1.0000 | ORAL_TABLET | Freq: Four times a day (QID) | ORAL | Status: DC | PRN

## 2013-08-05 MED ORDER — ONDANSETRON HCL 4 MG/2ML IJ SOLN
4.0000 mg | Freq: Once | INTRAMUSCULAR | Status: AC
  Administered 2013-08-05: 4 mg via INTRAVENOUS
  Filled 2013-08-05: qty 2

## 2013-08-05 NOTE — Discharge Instructions (Signed)
As discussed, your evaluation today has been largely reassuring.  But, it is important that you monitor your condition carefully, and do not hesitate to return to the ED if you develop new, or concerning changes in your condition.  Otherwise, please follow-up with your physician for appropriate ongoing care.    Abdominal Pain, Women Abdominal (stomach, pelvic, or belly) pain can be caused by many things. It is important to tell your doctor:  The location of the pain.  Does it come and go or is it present all the time?  Are there things that start the pain (eating certain foods, exercise)?  Are there other symptoms associated with the pain (fever, nausea, vomiting, diarrhea)? All of this is helpful to know when trying to find the cause of the pain. CAUSES   Stomach: virus or bacteria infection, or ulcer.  Intestine: appendicitis (inflamed appendix), regional ileitis (Crohn's disease), ulcerative colitis (inflamed colon), irritable bowel syndrome, diverticulitis (inflamed diverticulum of the colon), or cancer of the stomach or intestine.  Gallbladder disease or stones in the gallbladder.  Kidney disease, kidney stones, or infection.  Pancreas infection or cancer.  Fibromyalgia (pain disorder).  Diseases of the female organs:  Uterus: fibroid (non-cancerous) tumors or infection.  Fallopian tubes: infection or tubal pregnancy.  Ovary: cysts or tumors.  Pelvic adhesions (scar tissue).  Endometriosis (uterus lining tissue growing in the pelvis and on the pelvic organs).  Pelvic congestion syndrome (female organs filling up with blood just before the menstrual period).  Pain with the menstrual period.  Pain with ovulation (producing an egg).  Pain with an IUD (intrauterine device, birth control) in the uterus.  Cancer of the female organs.  Functional pain (pain not caused by a disease, may improve without treatment).  Psychological pain.  Depression. DIAGNOSIS   Your doctor will decide the seriousness of your pain by doing an examination.  Blood tests.  X-rays.  Ultrasound.  CT scan (computed tomography, special type of X-ray).  MRI (magnetic resonance imaging).  Cultures, for infection.  Barium enema (dye inserted in the large intestine, to better view it with X-rays).  Colonoscopy (looking in intestine with a lighted tube).  Laparoscopy (minor surgery, looking in abdomen with a lighted tube).  Major abdominal exploratory surgery (looking in abdomen with a large incision). TREATMENT  The treatment will depend on the cause of the pain.   Many cases can be observed and treated at home.  Over-the-counter medicines recommended by your caregiver.  Prescription medicine.  Antibiotics, for infection.  Birth control pills, for painful periods or for ovulation pain.  Hormone treatment, for endometriosis.  Nerve blocking injections.  Physical therapy.  Antidepressants.  Counseling with a psychologist or psychiatrist.  Minor or major surgery. HOME CARE INSTRUCTIONS   Do not take laxatives, unless directed by your caregiver.  Take over-the-counter pain medicine only if ordered by your caregiver. Do not take aspirin because it can cause an upset stomach or bleeding.  Try a clear liquid diet (broth or water) as ordered by your caregiver. Slowly move to a bland diet, as tolerated, if the pain is related to the stomach or intestine.  Have a thermometer and take your temperature several times a day, and record it.  Bed rest and sleep, if it helps the pain.  Avoid sexual intercourse, if it causes pain.  Avoid stressful situations.  Keep your follow-up appointments and tests, as your caregiver orders.  If the pain does not go away with medicine or surgery, you may  try:  Acupuncture.  Relaxation exercises (yoga, meditation).  Group therapy.  Counseling. SEEK MEDICAL CARE IF:   You notice certain foods cause stomach  pain.  Your home care treatment is not helping your pain.  You need stronger pain medicine.  You want your IUD removed.  You feel faint or lightheaded.  You develop nausea and vomiting.  You develop a rash.  You are having side effects or an allergy to your medicine. SEEK IMMEDIATE MEDICAL CARE IF:   Your pain does not go away or gets worse.  You have a fever.  Your pain is felt only in portions of the abdomen. The right side could possibly be appendicitis. The left lower portion of the abdomen could be colitis or diverticulitis.  You are passing blood in your stools (bright red or black tarry stools, with or without vomiting).  You have blood in your urine.  You develop chills, with or without a fever.  You pass out. MAKE SURE YOU:   Understand these instructions.  Will watch your condition.  Will get help right away if you are not doing well or get worse. Document Released: 03/03/2007 Document Revised: 07/29/2011 Document Reviewed: 03/23/2009 [facility] Patient Information 2014 [place], [place].

## 2013-08-05 NOTE — ED Notes (Addendum)
Patient c/o fatigue/dizziness & HA.. States that had loose bowel movement earlier this week and was experiencing intermittent  abd cramping pain after eating. Has not felt like eating most of the week. She is able to drink fluids.

## 2013-08-05 NOTE — ED Provider Notes (Signed)
CSN: [id number]     Arrival date & time 08/05/13  1721 History  This chart was scribed for Carmin Muskrat, MD by Elby Beck, ED Scribe. This patient was seen in room MH04/MH04 and the patient's care was started at 6:10 PM.   Chief Complaint  Patient presents with  . Dizziness  . Headache  . Abdominal Pain    The history is provided by the patient. No language interpreter was used.    HPI Comments: [name] is a 38 y.o. Female with a history of HTN who presents to the Emergency Department complaining of intermittent dizziness with associated lightheadedness and near syncopal episodes over the past 3 days, which worsened today. She reports that these sympotms are worsened with standing. She reports that she fainted while at work a couple years ago, and that she has been taking Vitamin B12 to try to correct for this.  She has a secondary complaint of cramping RUQ abdominal pain over the past 2 days. She reports associated episodes of diarrhea, as well as nausea and a lack of appetite. She reports having a history of cholecystectomy in 2005, as well as an abdominal hysterectomy.  She also reports a history of chronic headaches and has a tertiary complaint of a typical headache over the past 2 days. She states that she has tried her prescribed Topamax without relief of her headache, but that she has not tried any medications for other her other symptoms. She denies any known history of pancreatic disease, kidney disease or adrenal gland disease. She denies noticing a fever. She denies any other pain or symptoms.medications. She reports taking medication to manage her history of HTN. She denies drinking, but is a current every day smoker of 1 pack/day.   Past Medical History  Diagnosis Date  . Sebaceous cyst   . Abscess   . Anemia   . Hypertension   . Headache(784.0)     migraines   Past Surgical History  Procedure Laterality Date  . Partial hysterectomy    .  Cholecystectomy open    . Ovarian cyst removal    . R arm surgery    . Gastric bypass  2011    gastric sleeve  . Knee surgery  2005    left  . Cystectomy      2004 axilla bil  . Abdominal hysterectomy    . Medial patellofemoral ligament repair Left 06/08/2013    Procedure: DIAGNOSTIC OPERATIVE ARTHROSCOPY, OPEN MEDIAL PATELLA FEMORAL LIGAMENT RECONSTRUCTION;  Surgeon: Meredith Pel, MD;  Location: [facility];  Service: Orthopedics;  Laterality: Left;  with Hamstring autograft  . Fracture surgery      rt arm   Family History  Problem Relation Age of Onset  . Cancer Maternal Grandfather     lung   History  Substance Use Topics  . Smoking status: Current Every Day Smoker -- 0.25 packs/day for 10 years    Types: Cigarettes  . Smokeless tobacco: Never Used  . Alcohol Use: Yes   OB History   Grav Para Term Preterm Abortions TAB SAB Ect Mult Living                 Review of Systems  Constitutional:       Per HPI, otherwise negative  HENT:       Per HPI, otherwise negative  Respiratory:       Per HPI, otherwise negative  Cardiovascular:       Per HPI, otherwise negative  Endocrine:       Negative aside from HPI  Genitourinary:       Neg aside from HPI   Musculoskeletal:       Per HPI, otherwise negative  Skin: Negative.   Neurological: Negative for syncope.    Allergies  Penicillins  Home Medications   Current Outpatient Rx  Name  Route  Sig  Dispense  Refill  . aspirin 325 MG tablet   Oral   Take 1 tablet (325 mg total) by mouth daily.   30 tablet   0   . Cyanocobalamin (VITAMIN B 12 PO)   Oral   Take 1,000 mg by mouth daily.         . cyclobenzaprine (FLEXERIL) 10 MG tablet   Oral   Take 10 mg by mouth 3 (three) times daily as needed for muscle spasms.         . diclofenac (VOLTAREN) 25 MG EC tablet   Oral   Take 25 mg by mouth 2 (two) times daily.         Marland Kitchen gabapentin (NEURONTIN) 100 MG capsule   Oral   Take 100 mg by mouth 2 (two) times  daily.         . hydrochlorothiazide (HYDRODIURIL) 25 MG tablet   Oral   Take 25 mg by mouth daily.         Marland Kitchen LORazepam (ATIVAN) 1 MG tablet   Oral   Take 1-2 tablets (1-2 mg total) by mouth at bedtime as needed for sleep.   15 tablet   0   . oxyCODONE (OXY IR/ROXICODONE) 5 MG immediate release tablet   Oral   Take 1-2 tablets (5-10 mg total) by mouth every 3 (three) hours as needed for breakthrough pain.   60 tablet   0   . topiramate (TOPAMAX) 50 MG tablet   Oral   Take 50 mg by mouth 2 (two) times daily.          Triage Vitals: BP 153/98  Pulse 97  Temp(Src) 99.2 F (37.3 C) (Oral)  Resp 18  Ht 6' (1.829 m)  Wt 215 lb (97.523 kg)  BMI 29.15 kg/m2  SpO2 98%  Physical Exam  Nursing note and vitals reviewed. Constitutional: She is oriented to person, place, and time. She appears well-developed and well-nourished. No distress.  HENT:  Head: Normocephalic and atraumatic.  Eyes: Conjunctivae and EOM are normal.  Cardiovascular: Normal rate and regular rhythm.   Pulmonary/Chest: Effort normal and breath sounds normal. No stridor. No respiratory distress.  Abdominal: She exhibits no distension. There is tenderness (Mild tenderness to palpation in RUQ).  Musculoskeletal: She exhibits no edema.  Neurological: She is alert and oriented to person, place, and time. No cranial nerve deficit.  Skin: Skin is warm and dry.  Psychiatric: She has a normal mood and affect.    ED Course  Procedures (including critical care time)  DIAGNOSTIC STUDIES: Oxygen Saturation is 98% on RA, normal by my interpretation.    COORDINATION OF CARE: 6:15 PM- Discussed plan to obtain diagnostic lab work and radiology. Will also order IV fluids, Morphine and Zofran. Also counseled pt on smoking cessation. Pt advised of plan for treatment and pt agrees.  Medications  0.9 %  sodium chloride infusion (not administered)  0.9 %  sodium chloride infusion (1,000 mLs Intravenous New Bag/Given  08/05/13 1838)  ondansetron (ZOFRAN) injection 4 mg (4 mg Intravenous Given 08/05/13 1840)  morphine 4 MG/ML injection 4 mg (4  mg Intravenous Given 08/05/13 1840)   Labs Review Labs Reviewed  CBC WITH DIFFERENTIAL - Abnormal; Notable for the following:    MCV 101.2 (*)    MCH 34.4 (*)    All other components within normal limits  COMPREHENSIVE METABOLIC PANEL - Abnormal; Notable for the following:    Potassium 3.2 (*)    BUN 4 (*)    Calcium 8.3 (*)    Albumin 2.9 (*)    All other components within normal limits  LIPASE, BLOOD - Abnormal; Notable for the following:    Lipase 60 (*)    All other components within normal limits  URINALYSIS, ROUTINE W REFLEX MICROSCOPIC - Abnormal; Notable for the following:    Color, Urine AMBER (*)    Specific Gravity, Urine 1.031 (*)    Bilirubin Urine SMALL (*)    Ketones, ur 15 (*)    All other components within normal limits   Imaging Review Dg Abd Acute W/chest  08/05/2013   CLINICAL DATA:  Dizziness, headache, abdominal pain. 09/30/2012 chest ex  EXAM: ACUTE ABDOMEN SERIES (ABDOMEN 2 VIEW & CHEST 1 VIEW)  COMPARISON:  None.  FINDINGS: There is obscuration of the medial right diaphragm on the frontal chest study, which is likely projectional or transient atelectasis given no infiltrate or obscuration seen on abdominal imaging. Normal heart size. No effusion or pneumothorax. The bowel gas pattern is nonobstructive. No pneumoperitoneum. Cholecystectomy changes.  IMPRESSION: 1. Negative for bowel obstruction or perforation. 2. Transient opacity in the right lower lobe, consistent with atelectasis.   Electronically Signed   By: Jorje Guild M.D.   On: 08/05/2013 19:32    EKG has sinus rhythm, rate 76, normal  9:28 PM 100 exam the patient appears comfortable.  Daily conversational all results MDM   Final diagnoses:  None   I personally performed the services described in this documentation, which was scribed in my presence. The recorded  information has been reviewed and is accurate.   Patient presents with concerns of ongoing tinnitus complaints as well as headache, diarrhea, abdominal discomfort.  Patient is intolerant of oral hydration, has resolution of her headache here, has no neurologic deficits, no evidence of distress.  The patient's labs are largely reassuring, though there is ketonuria and slight elevation in lipase. Patient improved here with IV fluids, symptom control.  Patient was discharged in stable condition after she improved with her aforementioned reassuring labs, imaging.  Patient has a primary care physician with whom she followup. Patient understood return precautions, follow up instructions prior to discharge.  Carmin Muskrat, MD 08/05/13 2129

## 2013-09-29 ENCOUNTER — Emergency Department (HOSPITAL_BASED_OUTPATIENT_CLINIC_OR_DEPARTMENT_OTHER)
Admission: EM | Admit: 2013-09-29 | Discharge: 2013-09-29 | Disposition: A | Discharge status: Home / Self Care | Payer: Medicaid Other | Attending: Emergency Medicine | Admitting: Emergency Medicine

## 2013-09-29 ENCOUNTER — Encounter (HOSPITAL_BASED_OUTPATIENT_CLINIC_OR_DEPARTMENT_OTHER): Payer: Self-pay | Admitting: Emergency Medicine

## 2013-09-29 DIAGNOSIS — Z791 Long term (current) use of non-steroidal anti-inflammatories (NSAID): Secondary | ICD-10-CM | POA: Insufficient documentation

## 2013-09-29 DIAGNOSIS — Z880 Allergy status to penicillin: Secondary | ICD-10-CM | POA: Insufficient documentation

## 2013-09-29 DIAGNOSIS — Z79899 Other long term (current) drug therapy: Secondary | ICD-10-CM | POA: Insufficient documentation

## 2013-09-29 DIAGNOSIS — IMO0002 Reserved for concepts with insufficient information to code with codable children: Secondary | ICD-10-CM

## 2013-09-29 DIAGNOSIS — G43909 Migraine, unspecified, not intractable, without status migrainosus: Secondary | ICD-10-CM | POA: Insufficient documentation

## 2013-09-29 DIAGNOSIS — Z7982 Long term (current) use of aspirin: Secondary | ICD-10-CM | POA: Insufficient documentation

## 2013-09-29 DIAGNOSIS — Y838 Other surgical procedures as the cause of abnormal reaction of the patient, or of later complication, without mention of misadventure at the time of the procedure: Secondary | ICD-10-CM | POA: Insufficient documentation

## 2013-09-29 DIAGNOSIS — D649 Anemia, unspecified: Secondary | ICD-10-CM | POA: Insufficient documentation

## 2013-09-29 DIAGNOSIS — I10 Essential (primary) hypertension: Secondary | ICD-10-CM | POA: Insufficient documentation

## 2013-09-29 DIAGNOSIS — F17200 Nicotine dependence, unspecified, uncomplicated: Secondary | ICD-10-CM | POA: Insufficient documentation

## 2013-09-29 MED ORDER — OXYCODONE-ACETAMINOPHEN 5-325 MG PO TABS: 1.0000 | ORAL_TABLET | ORAL | Status: DC | PRN

## 2013-09-29 MED ORDER — OXYCODONE-ACETAMINOPHEN 5-325 MG PO TABS
2.0000 | ORAL_TABLET | Freq: Once | ORAL | Status: AC
  Administered 2013-09-29: 2 via ORAL
  Filled 2013-09-29: qty 2

## 2013-09-29 NOTE — ED Provider Notes (Signed)
CSN: [id number]     Arrival date & time 09/29/13  1747 History   First MD Initiated Contact with Patient 09/29/13 1831     Chief Complaint  Patient presents with  . Abscess     (Consider location/radiation/quality/duration/timing/severity/associated sxs/prior Treatment) Patient is a 38 y.o. female presenting with abscess. The history is provided by the patient. No language interpreter was used.  Abscess Location:  Torso Torso abscess location:  R chest Abscess quality: draining and painful   Abscess quality: no fluctuance   Red streaking: yes   Associated symptoms: no fever, no nausea and no vomiting   Associated symptoms comment:  Recurrent painful swelling on medial right breast. She has had multiple abscesses in same location. No fever. She has rash under breasts c/w fungal rash and treated as same. No fever.    Past Medical History  Diagnosis Date  . Sebaceous cyst   . Abscess   . Anemia   . Hypertension   . Headache(784.0)     migraines   Past Surgical History  Procedure Laterality Date  . Partial hysterectomy    . Cholecystectomy open    . Ovarian cyst removal    . R arm surgery    . Gastric bypass  2011    gastric sleeve  . Knee surgery  2005    left  . Cystectomy      2004 axilla bil  . Abdominal hysterectomy    . Medial patellofemoral ligament repair Left 06/08/2013    Procedure: DIAGNOSTIC OPERATIVE ARTHROSCOPY, OPEN MEDIAL PATELLA FEMORAL LIGAMENT RECONSTRUCTION;  Surgeon: Meredith Pel, MD;  Location: [place] [facility];  Service: Orthopedics;  Laterality: Left;  with Hamstring autograft  . Fracture surgery      rt arm   Family History  Problem Relation Age of Onset  . Cancer Maternal Grandfather     lung   History  Substance Use Topics  . Smoking status: Current Every Day Smoker -- 0.25 packs/day for 10 years    Types: Cigarettes  . Smokeless tobacco: Never Used  . Alcohol Use: Yes   OB History   Grav Para Term Preterm Abortions TAB SAB Ect Mult Living                  Review of Systems  Constitutional: Negative for fever.  Respiratory: Negative for shortness of breath.   Cardiovascular: Positive for chest pain.       See HPI.  Gastrointestinal: Negative for nausea, vomiting and abdominal pain.      Allergies  Penicillins  Home Medications   Prior to Admission medications   Medication Sig Start Date End Date Taking? Authorizing Provider  aspirin 325 MG tablet Take 1 tablet (325 mg total) by mouth daily. 06/09/13   Meredith Pel, MD  Cyanocobalamin (VITAMIN B 12 PO) Take 1,000 mg by mouth daily.    Historical Provider, MD  cyclobenzaprine (FLEXERIL) 10 MG tablet Take 10 mg by mouth 3 (three) times daily as needed for muscle spasms.    Historical Provider, MD  diclofenac (VOLTAREN) 25 MG EC tablet Take 25 mg by mouth 2 (two) times daily.    Historical Provider, MD  gabapentin (NEURONTIN) 100 MG capsule Take 100 mg by mouth 2 (two) times daily.    Historical Provider, MD  hydrochlorothiazide (HYDRODIURIL) 25 MG tablet Take 25 mg by mouth daily.    Historical Provider, MD  HYDROcodone-acetaminophen (NORCO) 5-325 MG per tablet Take 1 tablet by mouth every 6 (six) hours as  needed for moderate pain. 08/05/13   Carmin Muskrat, MD  LORazepam (ATIVAN) 1 MG tablet Take 1-2 tablets (1-2 mg total) by mouth at bedtime as needed for sleep. 03/25/13   John L Molpus, MD  ondansetron (ZOFRAN ODT) 4 MG disintegrating tablet Take 1 tablet (4 mg total) by mouth every 8 (eight) hours as needed for nausea or vomiting. 08/05/13   Carmin Muskrat, MD  oxyCODONE (OXY IR/ROXICODONE) 5 MG immediate release tablet Take 1-2 tablets (5-10 mg total) by mouth every 3 (three) hours as needed for breakthrough pain. 06/09/13   Meredith Pel, MD  topiramate (TOPAMAX) 50 MG tablet Take 50 mg by mouth 2 (two) times daily.    Historical Provider, MD   BP 160/99  Pulse 82  Temp(Src) 98.9 F (37.2 C)  Resp 16  Ht 6' (1.829 m)  Wt 212 lb (96.163 kg)  BMI 28.75  kg/m2  SpO2 100% Physical Exam  Constitutional: She appears well-developed and well-nourished. No distress.  Pulmonary/Chest: Effort normal.  Skin:  Induration at medial right breast c/w scarring. Small superficial ulceration present without drainage. No significant redness surrounding ulceration. There is a plaque type red rash under skin fold of breast c/w tinea.    ED Course  Procedures (including critical care time) Labs Review Labs Reviewed - No data to display  Imaging Review No results found.   EKG Interpretation None      MDM   Final diagnoses:  None    1. Right breast seroma  Attempted aspiration of painful swelling produced only thin, blood tinged fluid without purulence c/w seroma. Warm compresses. She has topical antifungal for tinea. Refer back to surgery for consideration of area excision.    Dewaine Oats, PA-C 09/29/13 2121

## 2013-09-29 NOTE — ED Notes (Signed)
Pt c/o abscess to mid chest x 1 week

## 2013-09-29 NOTE — ED Provider Notes (Signed)
Medical screening examination/treatment/procedure(s) were performed by non-physician practitioner and as supervising physician I was immediately available for consultation/collaboration.   EKG Interpretation None        Treveon Bourcier B. Karle Starch, MD 09/29/13 2125

## 2013-09-29 NOTE — ED Notes (Signed)
ED PA at [facility] 

## 2013-09-29 NOTE — Discharge Instructions (Signed)
Seroma A seroma is a collection of fluid that looks like swelling or a mass on the body. Seromas form on the body where tissue has been injured or cut. They are most common after surgeries. Seromas vary in size. Some are small and painless. Others may become large and cause pain or discomfort. Many seromas go away on their own; the fluid is naturally absorbed by the body. Some may require the fluid to be drained through medical procedures.  CAUSES  Seromas form as the result of damage to tissue or the removal of tissue. This tissue damage may occur during surgery or because of an injury or trauma. When tissue is disrupted or removed, empty space is created. The body's natural defense system causes fluid to enter the empty space and form a seroma. SYMPTOMS   Swelling at the site of a surgical cut (incision) or an injury.  Drainage of clear fluid at the surgery or injury site.  Possible discomfort or pain. DIAGNOSIS  Your caregiver will perform a physical exam. During the exam, the caregiver will press on the seroma using a hand or fingers (palpation). Various tests may be ordered to help confirm the diagnosis. These tests may include:  Blood tests.  Imaging tests such as ultrasonography or computed tomography (CT). TREATMENT  Sometimes seromas resolve on their own and drain naturally in the body. Your caregiver may monitor you to make sure the seroma does not cause any complications. If your seroma does not resolve on its own, treatment may include:  Using a needle to drain the fluid from the seroma (needle aspiration).  Inserting a flexible tube (catheter) to drain the fluid.  Applying a dressing, such as an elastic bandage or binder.  Use of antibiotic medicines if the seroma becomes infected.  In rare cases, surgery may be done to remove the seroma and repair the area. HOME CARE INSTRUCTIONS  Follow your caregiver's instructions regarding activity levels and any limitations on  movements.  Only take over-the-counter or prescription medicines as directed by your caregiver.  If your caregiver prescribes antibiotics, take them as directed. Finish them even if you start to feel better.  Check your seroma every day for redness, warmth, or yellow drainage.  Follow up with your caregiver as directed. SEEK MEDICAL CARE IF:  You develop a fever.  You have pain, tenderness, redness, or warmth at the site of the seroma.  You notice yellow drainage coming from the site of the seroma.  Your seroma is getting bigger. Document Released: 08/31/2012 Document Reviewed: 08/31/2012 [facility] Patient Information 2014 [place], [place].

## 2013-10-17 ENCOUNTER — Encounter (HOSPITAL_COMMUNITY): Payer: Self-pay | Admitting: Emergency Medicine

## 2013-10-17 ENCOUNTER — Emergency Department (HOSPITAL_COMMUNITY)
Admission: EM | Admit: 2013-10-17 | Discharge: 2013-10-17 | Disposition: A | Discharge status: Home / Self Care | Payer: Medicaid Other | Attending: Emergency Medicine | Admitting: Emergency Medicine

## 2013-10-17 DIAGNOSIS — Z9889 Other specified postprocedural states: Secondary | ICD-10-CM | POA: Diagnosis not present

## 2013-10-17 DIAGNOSIS — S8990XA Unspecified injury of unspecified lower leg, initial encounter: Secondary | ICD-10-CM | POA: Insufficient documentation

## 2013-10-17 DIAGNOSIS — Y9389 Activity, other specified: Secondary | ICD-10-CM | POA: Diagnosis not present

## 2013-10-17 DIAGNOSIS — S4980XA Other specified injuries of shoulder and upper arm, unspecified arm, initial encounter: Secondary | ICD-10-CM | POA: Diagnosis not present

## 2013-10-17 DIAGNOSIS — Z880 Allergy status to penicillin: Secondary | ICD-10-CM | POA: Insufficient documentation

## 2013-10-17 DIAGNOSIS — S0993XA Unspecified injury of face, initial encounter: Secondary | ICD-10-CM | POA: Diagnosis present

## 2013-10-17 DIAGNOSIS — S199XXA Unspecified injury of neck, initial encounter: Secondary | ICD-10-CM | POA: Diagnosis present

## 2013-10-17 DIAGNOSIS — S39012A Strain of muscle, fascia and tendon of lower back, initial encounter: Secondary | ICD-10-CM

## 2013-10-17 DIAGNOSIS — S46909A Unspecified injury of unspecified muscle, fascia and tendon at shoulder and upper arm level, unspecified arm, initial encounter: Secondary | ICD-10-CM | POA: Diagnosis not present

## 2013-10-17 DIAGNOSIS — Z791 Long term (current) use of non-steroidal anti-inflammatories (NSAID): Secondary | ICD-10-CM | POA: Diagnosis not present

## 2013-10-17 DIAGNOSIS — S99919A Unspecified injury of unspecified ankle, initial encounter: Secondary | ICD-10-CM

## 2013-10-17 DIAGNOSIS — Y92410 Unspecified street and highway as the place of occurrence of the external cause: Secondary | ICD-10-CM | POA: Diagnosis not present

## 2013-10-17 DIAGNOSIS — Z862 Personal history of diseases of the blood and blood-forming organs and certain disorders involving the immune mechanism: Secondary | ICD-10-CM | POA: Insufficient documentation

## 2013-10-17 DIAGNOSIS — Z7982 Long term (current) use of aspirin: Secondary | ICD-10-CM | POA: Diagnosis not present

## 2013-10-17 DIAGNOSIS — S161XXA Strain of muscle, fascia and tendon at neck level, initial encounter: Secondary | ICD-10-CM

## 2013-10-17 DIAGNOSIS — Z872 Personal history of diseases of the skin and subcutaneous tissue: Secondary | ICD-10-CM | POA: Insufficient documentation

## 2013-10-17 DIAGNOSIS — I10 Essential (primary) hypertension: Secondary | ICD-10-CM | POA: Insufficient documentation

## 2013-10-17 DIAGNOSIS — S335XXA Sprain of ligaments of lumbar spine, initial encounter: Secondary | ICD-10-CM

## 2013-10-17 DIAGNOSIS — G43909 Migraine, unspecified, not intractable, without status migrainosus: Secondary | ICD-10-CM | POA: Insufficient documentation

## 2013-10-17 DIAGNOSIS — S99929A Unspecified injury of unspecified foot, initial encounter: Secondary | ICD-10-CM

## 2013-10-17 DIAGNOSIS — S339XXA Sprain of unspecified parts of lumbar spine and pelvis, initial encounter: Secondary | ICD-10-CM | POA: Insufficient documentation

## 2013-10-17 DIAGNOSIS — F17200 Nicotine dependence, unspecified, uncomplicated: Secondary | ICD-10-CM | POA: Insufficient documentation

## 2013-10-17 DIAGNOSIS — S139XXA Sprain of joints and ligaments of unspecified parts of neck, initial encounter: Secondary | ICD-10-CM | POA: Insufficient documentation

## 2013-10-17 MED ORDER — DIAZEPAM 5 MG PO TABS
5.0000 mg | ORAL_TABLET | Freq: Once | ORAL | Status: AC
  Administered 2013-10-17: 5 mg via ORAL
  Filled 2013-10-17: qty 1

## 2013-10-17 MED ORDER — OXYCODONE HCL 5 MG PO TABS: 5.0000 mg | ORAL_TABLET | ORAL | Status: DC | PRN

## 2013-10-17 MED ORDER — DIAZEPAM 5 MG PO TABS: 5.0000 mg | ORAL_TABLET | Freq: Three times a day (TID) | ORAL | Status: DC | PRN

## 2013-10-17 MED ORDER — HYDROMORPHONE HCL PF 1 MG/ML IJ SOLN
1.0000 mg | Freq: Once | INTRAMUSCULAR | Status: AC
  Administered 2013-10-17: 1 mg via INTRAMUSCULAR
  Filled 2013-10-17: qty 1
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
## 2013-10-17 NOTE — Discharge Instructions (Signed)
Cervical Sprain A cervical sprain is an injury in the neck in which the strong, fibrous tissues (ligaments) that connect your neck bones stretch or tear. Cervical sprains can range from mild to severe. Severe cervical sprains can cause the neck vertebrae to be unstable. This can lead to damage of the spinal cord and can result in serious nervous system problems. The amount of time it takes for a cervical sprain to get better depends on the cause and extent of the injury. Most cervical sprains heal in 1 to 3 weeks. CAUSES  Severe cervical sprains may be caused by:   Contact sport injuries (such as from football, rugby, wrestling, hockey, auto racing, gymnastics, diving, martial arts, or boxing).   Motor vehicle collisions.   Whiplash injuries. This is an injury from a sudden forward-and backward whipping movement of the head and neck.  Falls.  Mild cervical sprains may be caused by:   Being in an awkward position, such as while cradling a telephone between your ear and shoulder.   Sitting in a chair that does not offer proper support.   Working at a poorly Landscape architect station.   Looking up or down for long periods of time.  SYMPTOMS   Pain, soreness, stiffness, or a burning sensation in the front, back, or sides of the neck. This discomfort may develop immediately after the injury or slowly, 24 hours or more after the injury.   Pain or tenderness directly in the middle of the back of the neck.   Shoulder or upper back pain.   Limited ability to move the neck.   Headache.   Dizziness.   Weakness, numbness, or tingling in the hands or arms.   Muscle spasms.   Difficulty swallowing or chewing.   Tenderness and swelling of the neck.  DIAGNOSIS  Most of the time your health care provider can diagnose a cervical sprain by taking your history and doing a physical exam. Your health care provider will ask about previous neck injuries and any known neck  problems, such as arthritis in the neck. X-rays may be taken to find out if there are any other problems, such as with the bones of the neck. Other tests, such as a CT scan or MRI, may also be needed.  TREATMENT  Treatment depends on the severity of the cervical sprain. Mild sprains can be treated with rest, keeping the neck in place (immobilization), and pain medicines. Severe cervical sprains are immediately immobilized. Further treatment is done to help with pain, muscle spasms, and other symptoms and may include:  Medicines, such as pain relievers, numbing medicines, or muscle relaxants.   Physical therapy. This may involve stretching exercises, strengthening exercises, and posture training. Exercises and improved posture can help stabilize the neck, strengthen muscles, and help stop symptoms from returning.  HOME CARE INSTRUCTIONS   Put ice on the injured area.   Put ice in a plastic bag.   Place a towel between your skin and the bag.   Leave the ice on for 15 20 minutes, 3 4 times a day.   If your injury was severe, you may have been given a cervical collar to wear. A cervical collar is a two-piece collar designed to keep your neck from moving while it heals.  Do not remove the collar unless instructed by your health care provider.  If you have long hair, keep it outside of the collar.  Ask your health care provider before making any adjustments to your collar.  Minor adjustments may be required over time to improve comfort and reduce pressure on your chin or on the back of your head.  Ifyou are allowed to remove the collar for cleaning or bathing, follow your health care provider's instructions on how to do so safely.  Keep your collar clean by wiping it with mild soap and water and drying it completely. If the collar you have been given includes removable pads, remove them every 1 2 days and hand wash them with soap and water. Allow them to air dry. They should be completely  dry before you wear them in the collar.  If you are allowed to remove the collar for cleaning and bathing, wash and dry the skin of your neck. Check your skin for irritation or sores. If you see any, tell your health care provider.  Do not drive while wearing the collar.   Only take over-the-counter or prescription medicines for pain, discomfort, or fever as directed by your health care provider.   Keep all follow-up appointments as directed by your health care provider.   Keep all physical therapy appointments as directed by your health care provider.   Make any needed adjustments to your workstation to promote good posture.   Avoid positions and activities that make your symptoms worse.   Warm up and stretch before being active to help prevent problems.  SEEK MEDICAL CARE IF:   Your pain is not controlled with medicine.   You are unable to decrease your pain medicine over time as planned.   Your activity level is not improving as expected.  SEEK IMMEDIATE MEDICAL CARE IF:   You develop any bleeding.  You develop stomach upset.  You have signs of an allergic reaction to your medicine.   Your symptoms get worse.   You develop new, unexplained symptoms.   You have numbness, tingling, weakness, or paralysis in any part of your body.  MAKE SURE YOU:   Understand these instructions.  Will watch your condition.  Will get help right away if you are not doing well or get worse. Document Released: 03/03/2007 Document Revised: 02/24/2013 Document Reviewed: 11/11/2012 [facility] Patient Information 2014 [place]. Take the prescribed medication on a regular basis STOP THE FLEXERIL AND PERCOCET  Your records indicate that you had normal x rays of your neck, knee, and low back Please make an appointment with your PCP for further evaluation as needed
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
## 2013-10-18 ENCOUNTER — Encounter (HOSPITAL_COMMUNITY): Payer: Self-pay | Admitting: Emergency Medicine

## 2013-10-18 ENCOUNTER — Emergency Department (HOSPITAL_COMMUNITY)
Admission: EM | Admit: 2013-10-18 | Discharge: 2013-10-19 | Disposition: A | Discharge status: Home / Self Care | Payer: Medicaid Other | Attending: Emergency Medicine | Admitting: Emergency Medicine

## 2013-10-18 DIAGNOSIS — S199XXA Unspecified injury of neck, initial encounter: Secondary | ICD-10-CM | POA: Diagnosis not present

## 2013-10-18 DIAGNOSIS — Z872 Personal history of diseases of the skin and subcutaneous tissue: Secondary | ICD-10-CM | POA: Diagnosis not present

## 2013-10-18 DIAGNOSIS — F17200 Nicotine dependence, unspecified, uncomplicated: Secondary | ICD-10-CM | POA: Insufficient documentation

## 2013-10-18 DIAGNOSIS — S0993XA Unspecified injury of face, initial encounter: Secondary | ICD-10-CM | POA: Diagnosis not present

## 2013-10-18 DIAGNOSIS — Y92410 Unspecified street and highway as the place of occurrence of the external cause: Secondary | ICD-10-CM | POA: Diagnosis not present

## 2013-10-18 DIAGNOSIS — Z79899 Other long term (current) drug therapy: Secondary | ICD-10-CM | POA: Insufficient documentation

## 2013-10-18 DIAGNOSIS — R51 Headache: Secondary | ICD-10-CM

## 2013-10-18 DIAGNOSIS — IMO0002 Reserved for concepts with insufficient information to code with codable children: Secondary | ICD-10-CM | POA: Insufficient documentation

## 2013-10-18 DIAGNOSIS — Y9389 Activity, other specified: Secondary | ICD-10-CM | POA: Diagnosis not present

## 2013-10-18 DIAGNOSIS — S0990XA Unspecified injury of head, initial encounter: Secondary | ICD-10-CM | POA: Insufficient documentation

## 2013-10-18 DIAGNOSIS — M542 Cervicalgia: Secondary | ICD-10-CM

## 2013-10-18 DIAGNOSIS — I10 Essential (primary) hypertension: Secondary | ICD-10-CM | POA: Insufficient documentation

## 2013-10-18 DIAGNOSIS — Z862 Personal history of diseases of the blood and blood-forming organs and certain disorders involving the immune mechanism: Secondary | ICD-10-CM | POA: Diagnosis not present

## 2013-10-18 DIAGNOSIS — M549 Dorsalgia, unspecified: Secondary | ICD-10-CM

## 2013-10-18 DIAGNOSIS — R519 Headache, unspecified: Secondary | ICD-10-CM

## 2013-10-18 DIAGNOSIS — Z880 Allergy status to penicillin: Secondary | ICD-10-CM | POA: Diagnosis not present

## 2013-10-18 DIAGNOSIS — G43909 Migraine, unspecified, not intractable, without status migrainosus: Secondary | ICD-10-CM | POA: Insufficient documentation
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
## 2013-10-19 ENCOUNTER — Emergency Department (HOSPITAL_COMMUNITY): Payer: Medicaid Other

## 2013-10-19 MED ORDER — HYDROMORPHONE HCL PF 2 MG/ML IJ SOLN
2.0000 mg | Freq: Once | INTRAMUSCULAR | Status: AC
  Administered 2013-10-19: 2 mg via INTRAVENOUS
  Filled 2013-10-19: qty 1

## 2013-10-19 MED ORDER — DEXAMETHASONE SODIUM PHOSPHATE 10 MG/ML IJ SOLN
10.0000 mg | Freq: Once | INTRAMUSCULAR | Status: AC
  Administered 2013-10-19: 10 mg via INTRAVENOUS
  Filled 2013-10-19: qty 1

## 2013-10-19 MED ORDER — IBUPROFEN 600 MG PO TABS: 600.0000 mg | ORAL_TABLET | Freq: Three times a day (TID) | ORAL | Status: DC | PRN

## 2013-10-19 MED ORDER — METOCLOPRAMIDE HCL 5 MG/ML IJ SOLN
10.0000 mg | Freq: Once | INTRAMUSCULAR | Status: AC
  Administered 2013-10-19: 10 mg via INTRAVENOUS
  Filled 2013-10-19: qty 2

## 2013-10-19 MED ORDER — KETOROLAC TROMETHAMINE 30 MG/ML IJ SOLN
30.0000 mg | Freq: Once | INTRAMUSCULAR | Status: AC
  Administered 2013-10-19: 30 mg via INTRAVENOUS
  Filled 2013-10-19: qty 1

## 2013-10-19 NOTE — ED Provider Notes (Signed)
Medical screening examination/treatment/procedure(s) were conducted as a shared visit with non-physician practitioner(s) and myself.  I personally evaluated the patient during the encounter.   EKG Interpretation None      Pain improved in the emergency department.  Plain films without abnormality.  No indication for MRI.  Normal upper lower extremity neurologic exams.  Patient sent home on anti-inflammatories.  She has oxycodone from yesterday as well as Valium from yesterday's ER stay.  Part of the issues the patient is on 10 mg Percocets chronically and she is out of these.  It sounds that much of this is acute on chronic pain exacerbation  Hoy Morn, MD 10/19/13 0425
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
## 2013-10-19 NOTE — Discharge Instructions (Signed)
Back Exercises Back exercises help treat and prevent back injuries. The goal of back exercises is to increase the strength of your abdominal and back muscles and the flexibility of your back. These exercises should be started when you no longer have back pain. Back exercises include:  Pelvic Tilt. Lie on your back with your knees bent. Tilt your pelvis until the lower part of your back is against the floor. Hold this position 5 to 10 sec and repeat 5 to 10 times.  Knee to Chest. Pull first 1 knee up against your chest and hold for 20 to 30 seconds, repeat this with the other knee, and then both knees. This may be done with the other leg straight or bent, whichever feels better.  Sit-Ups or Curl-Ups. Bend your knees 90 degrees. Start with tilting your pelvis, and do a partial, slow sit-up, lifting your trunk only 30 to 45 degrees off the floor. Take at least 2 to 3 seconds for each sit-up. Do not do sit-ups with your knees out straight. If partial sit-ups are difficult, simply do the above but with only tightening your abdominal muscles and holding it as directed.  Hip-Lift. Lie on your back with your knees flexed 90 degrees. Push down with your feet and shoulders as you raise your hips a couple inches off the floor; hold for 10 seconds, repeat 5 to 10 times.  Back arches. Lie on your stomach, propping yourself up on bent elbows. Slowly press on your hands, causing an arch in your low back. Repeat 3 to 5 times. Any initial stiffness and discomfort should lessen with repetition over time.  Shoulder-Lifts. Lie face down with arms beside your body. Keep hips and torso pressed to floor as you slowly lift your head and shoulders off the floor. Do not overdo your exercises, especially in the beginning. Exercises may cause you some mild back discomfort which lasts for a few minutes; however, if the pain is more severe, or lasts for more than 15 minutes, do not continue exercises until you see your caregiver.  Improvement with exercise therapy for back problems is slow.  See your caregivers for assistance with developing a proper back exercise program. Document Released: 06/13/2004 Document Revised: 07/29/2011 Document Reviewed: 03/07/2011 [employer] Patient Information 2014 [employer].  

## 2013-10-19 NOTE — ED Provider Notes (Signed)
Medical screening examination/treatment/procedure(s) were performed by non-physician practitioner and as supervising physician I was immediately available for consultation/collaboration.   EKG Interpretation None        Osvaldo Shipper, MD 10/19/13 [phone]

## 2013-12-02 ENCOUNTER — Emergency Department (HOSPITAL_BASED_OUTPATIENT_CLINIC_OR_DEPARTMENT_OTHER)
Admission: EM | Admit: 2013-12-02 | Discharge: 2013-12-02 | Disposition: A | Discharge status: Home / Self Care | Payer: Medicaid Other | Attending: Emergency Medicine | Admitting: Emergency Medicine

## 2013-12-02 ENCOUNTER — Encounter (HOSPITAL_BASED_OUTPATIENT_CLINIC_OR_DEPARTMENT_OTHER): Payer: Self-pay | Admitting: Emergency Medicine

## 2013-12-02 DIAGNOSIS — Z862 Personal history of diseases of the blood and blood-forming organs and certain disorders involving the immune mechanism: Secondary | ICD-10-CM | POA: Insufficient documentation

## 2013-12-02 DIAGNOSIS — Z880 Allergy status to penicillin: Secondary | ICD-10-CM | POA: Insufficient documentation

## 2013-12-02 DIAGNOSIS — Z79899 Other long term (current) drug therapy: Secondary | ICD-10-CM | POA: Insufficient documentation

## 2013-12-02 DIAGNOSIS — F17200 Nicotine dependence, unspecified, uncomplicated: Secondary | ICD-10-CM | POA: Diagnosis not present

## 2013-12-02 DIAGNOSIS — L0501 Pilonidal cyst with abscess: Secondary | ICD-10-CM | POA: Insufficient documentation

## 2013-12-02 DIAGNOSIS — I10 Essential (primary) hypertension: Secondary | ICD-10-CM | POA: Insufficient documentation

## 2013-12-02 DIAGNOSIS — L0390 Cellulitis, unspecified: Secondary | ICD-10-CM | POA: Diagnosis present

## 2013-12-02 DIAGNOSIS — L0291 Cutaneous abscess, unspecified: Secondary | ICD-10-CM | POA: Diagnosis present

## 2013-12-02 MED ORDER — OXYCODONE-ACETAMINOPHEN 10-325 MG PO TABS: 1.0000 | ORAL_TABLET | ORAL | Status: DC | PRN

## 2013-12-02 MED ORDER — HYDROMORPHONE HCL PF 2 MG/ML IJ SOLN
2.0000 mg | Freq: Once | INTRAMUSCULAR | Status: AC
  Administered 2013-12-02: 2 mg via INTRAMUSCULAR
  Filled 2013-12-02: qty 1

## 2013-12-02 NOTE — Discharge Instructions (Signed)
Pilonidal Cyst A pilonidal cyst occurs when hairs get trapped (ingrown) beneath the skin in the crease between the buttocks over your sacrum (the bone under that crease). Pilonidal cysts are most common in young men with a lot of body hair. When the cyst is ruptured (breaks) or leaking, fluid from the cyst may cause burning and itching. If the cyst becomes infected, it causes a painful swelling filled with pus (abscess). The pus and trapped hairs need to be removed (often by lancing) so that the infection can heal. However, recurrence is common and an operation may be needed to remove the cyst. HOME CARE INSTRUCTIONS   If the cyst WAS INFECTED and needed to be drained:  Your caregiver packed the wound with gauze to keep the wound open. This allows the wound to heal from the inside outwards and continue draining.  Return for a wound check as advised.  If you take tub baths or showers, repack the wound with gauze following them. Sponge baths (at the sink) are a good alternative.  If an antibiotic was ordered to fight the infection, take as directed.  Only take over-the-counter or prescription medicines for pain, discomfort, or fever as directed by your caregiver.  After the drain is removed, use sitz baths for 20 minutes 4 times per day. Clean the wound gently with mild unscented soap, pat dry, and then apply a dry dressing. SEEK MEDICAL CARE IF:   You have increased pain, swelling, redness, drainage, or bleeding from the area.  You have a fever.  You have muscles aches, dizziness, or a general ill feeling. Document Released: 05/03/2000 Document Revised: 07/29/2011 Document Reviewed: 07/01/2008 [facility] Patient Information 2015 [place], [place]. This information is not intended to replace advice given to you by your health care provider. Make sure you discuss any questions you have with your health care provider.

## 2013-12-02 NOTE — ED Notes (Signed)
Pt c/o abscess to buttocks area, pressure to sit, no drainage

## 2013-12-02 NOTE — ED Provider Notes (Signed)
CSN: [id number]     Arrival date & time 12/02/13  0207 History   First MD Initiated Contact with Patient 12/02/13 [phone]     Chief Complaint  Patient presents with  . Abscess     (Consider location/radiation/quality/duration/timing/severity/associated sxs/prior Treatment) HPI This is a 38 year old female with a remote history of a pilonidal abscess. She is here with pain, swelling and induration in the right para-coccygeal region for the past week. Pain has worsened and there is now severe pain on attempting to sit. There is been no drainage. She has not had a fever.  Past Medical History  Diagnosis Date  . Sebaceous cyst   . Abscess   . Anemia   . Hypertension   . Headache(784.0)     migraines   Past Surgical History  Procedure Laterality Date  . Partial hysterectomy    . Cholecystectomy open    . Ovarian cyst removal    . R arm surgery    . Gastric bypass  2011    gastric sleeve  . Knee surgery  2005    left  . Cystectomy      2004 axilla bil  . Abdominal hysterectomy    . Medial patellofemoral ligament repair Left 06/08/2013    Procedure: DIAGNOSTIC OPERATIVE ARTHROSCOPY, OPEN MEDIAL PATELLA FEMORAL LIGAMENT RECONSTRUCTION;  Surgeon: Meredith Pel, MD;  Location: [facility];  Service: Orthopedics;  Laterality: Left;  with Hamstring autograft  . Fracture surgery      rt arm   Family History  Problem Relation Age of Onset  . Cancer Maternal Grandfather     lung   History  Substance Use Topics  . Smoking status: Current Every Day Smoker -- 0.25 packs/day for 10 years    Types: Cigarettes  . Smokeless tobacco: Never Used  . Alcohol Use: Yes   OB History   Grav Para Term Preterm Abortions TAB SAB Ect Mult Living                 Review of Systems  All other systems reviewed and are negative.   Allergies  Penicillins  Home Medications   Prior to Admission medications   Medication Sig Start Date End Date Taking? Authorizing Provider  acetaminophen (TYLENOL)  500 MG tablet Take 500 mg by mouth every 8 (eight) hours as needed for mild pain or headache.    Historical Provider, MD  cyclobenzaprine (FLEXERIL) 10 MG tablet Take 10 mg by mouth 3 (three) times daily as needed for muscle spasms.    Historical Provider, MD  diazepam (VALIUM) 5 MG tablet Take 1 tablet (5 mg total) by mouth every 8 (eight) hours as needed for anxiety. 10/17/13   Garald Balding, NP  gabapentin (NEURONTIN) 100 MG capsule Take 100 mg by mouth 2 (two) times daily.    Historical Provider, MD  hydrochlorothiazide (HYDRODIURIL) 25 MG tablet Take 25 mg by mouth daily.    Historical Provider, MD  ibuprofen (ADVIL,MOTRIN) 600 MG tablet Take 1 tablet (600 mg total) by mouth every 8 (eight) hours as needed. 10/19/13   Hoy Morn, MD  Menthol-Methyl Salicylate (BEN GAY GREASELESS) 10-15 % greaseless cream Apply topically at bedtime.    Historical Provider, MD  oxyCODONE (ROXICODONE) 5 MG immediate release tablet Take 1 tablet (5 mg total) by mouth every 4 (four) hours as needed for severe pain. 10/17/13   Garald Balding, NP  oxyCODONE-acetaminophen (PERCOCET/ROXICET) 5-325 MG per tablet Take 1-2 tablets by mouth every 4 (four) hours  as needed for severe pain. 09/29/13   Shari A Upstill, PA-C  temazepam (RESTORIL) 15 MG capsule Take 15 mg by mouth at bedtime as needed for sleep.    Historical Provider, MD  topiramate (TOPAMAX) 50 MG tablet Take 50 mg by mouth 2 (two) times daily.    Historical Provider, MD   BP 145/77  Pulse 98  Temp(Src) 99.5 F (37.5 C) (Oral)  Resp 20  Ht 6' (1.829 m)  Wt 205 lb (92.987 kg)  BMI 27.80 kg/m2  SpO2 100%  Physical Exam General: Well-developed, well-nourished female in no acute distress; appearance consistent with age of record HENT: normocephalic; atraumatic Eyes: pupils equal, round and reactive to light; extraocular muscles intact Neck: supple Heart: regular rate and rhythm Lungs: Normal respiratory effort and excursion Abdomen: soft;  nondistended Extremities: No deformity; full range of motion Neurologic: Awake, alert and oriented; motor function intact in all extremities and symmetric; no facial droop Skin: Warm and dry; large, tender, indurated area to the right of the coccyx consistent with an abscess Psychiatric: Mildly anxious    ED Course  Procedures (including critical care time)  INCISION AND DRAINAGE Performed by: Wynetta Fines Consent: Verbal consent obtained. Risks and benefits: risks, benefits and alternatives were discussed Type: abscess  Body area: right para-coccygeal region  Anesthesia: local infiltration  Incision was made with a scalpel.  Local anesthetic: lidocaine 2% with epinephrine  Anesthetic total: 7 ml  Complexity: complex Blunt dissection to break up loculations  Drainage: purulent  Drainage amount: copious  Packing material: 1 inch iodoform gauze  Patient tolerance: Patient tolerated the procedure well with no immediate complications.    MDM  Patient was advised that due to the size of the abscess deep tracking it could not be ruled out and that she may need further exploration under anesthesia. She has been seen at [facility] in the past. We will refer her back for followup.    Wynetta Fines, MD 12/02/13 [phone]

## 2013-12-03 ENCOUNTER — Encounter (INDEPENDENT_AMBULATORY_CARE_PROVIDER_SITE_OTHER): Payer: Self-pay | Admitting: General Surgery

## 2013-12-03 ENCOUNTER — Ambulatory Visit (INDEPENDENT_AMBULATORY_CARE_PROVIDER_SITE_OTHER): Payer: Medicaid Other | Admitting: General Surgery

## 2013-12-03 VITALS — BP 126/70 | HR 90 | Temp 98.0°F | Resp 18 | Ht 70.0 in | Wt 212.0 lb

## 2013-12-03 DIAGNOSIS — L0501 Pilonidal cyst with abscess: Secondary | ICD-10-CM | POA: Insufficient documentation

## 2013-12-03 MED ORDER — METRONIDAZOLE 500 MG PO TABS: 500.0000 mg | ORAL_TABLET | Freq: Three times a day (TID) | ORAL | Status: AC

## 2013-12-03 MED ORDER — DOXYCYCLINE HYCLATE 100 MG PO TABS: 100.0000 mg | ORAL_TABLET | Freq: Two times a day (BID) | ORAL | Status: DC

## 2013-12-03 NOTE — Progress Notes (Signed)
Patient ID: [name], female   DOB: 04/17/1976, 38 y.o.   MRN: [id number]  Chief Complaint  Patient presents with  . Follow-up    pil. cyst    HPI [name] is a 38 y.o. female.   HPI  She is referred by Dr. Read [name] from the emergency department for further evaluation and treatment of a pilonidal abscess.  When she was 38 years old, she had removal of a pilonidal cyst from the upper gluteal cleft area. Over the past 7 months, she's had multiple episodes of recurring infection in the pilonidal area. She was seen in the emergency department yesterday. An incision and drainage was done. She was then referred here.  Past Medical History  Diagnosis Date  . Sebaceous cyst   . Abscess   . Anemia   . Hypertension   . Headache(784.0)     migraines    Past Surgical History  Procedure Laterality Date  . Partial hysterectomy    . Cholecystectomy open    . Ovarian cyst removal    . R arm surgery    . Gastric bypass  2011    gastric sleeve  . Knee surgery  2005    left  . Cystectomy      2004 axilla bil  . Abdominal hysterectomy    . Medial patellofemoral ligament repair Left 06/08/2013    Procedure: DIAGNOSTIC OPERATIVE ARTHROSCOPY, OPEN MEDIAL PATELLA FEMORAL LIGAMENT RECONSTRUCTION;  Surgeon: Erin Copa, MD;  Location: [facility] OR;  Service: Orthopedics;  Laterality: Left;  with Hamstring autograft  . Fracture surgery      rt arm    Family History  Problem Relation Age of Onset  . Cancer Maternal Grandfather     lung    Social History History  Substance Use Topics  . Smoking status: Current Every Day Smoker -- 0.25 packs/day for 10 years    Types: Cigarettes  . Smokeless tobacco: Never Used  . Alcohol Use: Yes    Allergies  Allergen Reactions  . Penicillins Anaphylaxis, Swelling and Rash    Face and throat swell    Current Outpatient Prescriptions  Medication Sig Dispense Refill  . diazepam (VALIUM) 5 MG tablet Take 1 tablet (5 mg total) by  mouth every 8 (eight) hours as needed for anxiety.  20 tablet  0  . gabapentin (NEURONTIN) 100 MG capsule Take 100 mg by mouth 2 (two) times daily.      . hydrochlorothiazide (HYDRODIURIL) 25 MG tablet Take 25 mg by mouth daily.      Marland Kitchen ibuprofen (ADVIL,MOTRIN) 600 MG tablet Take 1 tablet (600 mg total) by mouth every 8 (eight) hours as needed.  15 tablet  0  . Menthol-Methyl Salicylate (BEN GAY GREASELESS) 10-15 % greaseless cream Apply topically at bedtime.      Marland Kitchen oxyCODONE (ROXICODONE) 5 MG immediate release tablet Take 1 tablet (5 mg total) by mouth every 4 (four) hours as needed for severe pain.  12 tablet  0  . oxyCODONE-acetaminophen (PERCOCET) 10-325 MG per tablet Take 1 tablet by mouth every 4 (four) hours as needed for pain.  20 tablet  0  . oxyCODONE-acetaminophen (PERCOCET/ROXICET) 5-325 MG per tablet Take 1-2 tablets by mouth every 4 (four) hours as needed for severe pain.  20 tablet  0  . temazepam (RESTORIL) 15 MG capsule Take 15 mg by mouth at bedtime as needed for sleep.      Marland Kitchen topiramate (TOPAMAX) 50 MG tablet Take 50 mg by  mouth 2 (two) times daily.      Marland Kitchen acetaminophen (TYLENOL) 500 MG tablet Take 500 mg by mouth every 8 (eight) hours as needed for mild pain or headache.      . cyclobenzaprine (FLEXERIL) 10 MG tablet Take 10 mg by mouth 3 (three) times daily as needed for muscle spasms.      Marland Kitchen doxycycline (VIBRA-TABS) 100 MG tablet Take 1 tablet (100 mg total) by mouth 2 (two) times daily.  20 tablet  0  . metroNIDAZOLE (FLAGYL) 500 MG tablet Take 1 tablet (500 mg total) by mouth 3 (three) times daily.  30 tablet  0   No current facility-administered medications for this visit.    Review of Systems Review of Systems  Constitutional: Negative for fever and chills.  Musculoskeletal: Positive for back pain.    Blood pressure 126/70, pulse 90, temperature 98 F (36.7 C), resp. rate 18, height 5\' 10"  (1.778 m), weight 212 lb (96.163 kg).  Physical Exam Physical Exam   Constitutional: No distress.  Overweight female.  Musculoskeletal:  There is a scar in the upper gluteal cleft area. Immediately inferior and to the right of this as an open wound with packing and surrounding induration. The packing was removed. No purulent drainage was present. The wound was repacked with iodoform gauze and a bulky dressing was applied.    Data Reviewed Note from the emergency department  Assessment    Recurring pilonidal abscess.     Plan    Start on antibiotics. Remove packing tomorrow and clean in the shower twice a day. We talked about her needing an extensive redo pilonidal cystectomy when this process heels and the induration decreases. We'll see her back in 3 weeks.        Nash[place] J 12/03/2013, 4:43 PM

## 2013-12-03 NOTE — Patient Instructions (Signed)
Removed bandage and packing in 24 hours. Clean the area in the bathtub or shower at least twice a day. Try to keep the incision open as long as you can.

## 2013-12-29 ENCOUNTER — Encounter (INDEPENDENT_AMBULATORY_CARE_PROVIDER_SITE_OTHER): Payer: Medicaid Other | Admitting: General Surgery

## 2014-01-09 ENCOUNTER — Emergency Department (HOSPITAL_BASED_OUTPATIENT_CLINIC_OR_DEPARTMENT_OTHER)
Admission: EM | Admit: 2014-01-09 | Discharge: 2014-01-09 | Disposition: A | Discharge status: Home / Self Care | Payer: Medicaid Other | Attending: Emergency Medicine | Admitting: Emergency Medicine

## 2014-01-09 ENCOUNTER — Encounter (HOSPITAL_BASED_OUTPATIENT_CLINIC_OR_DEPARTMENT_OTHER): Payer: Self-pay | Admitting: Emergency Medicine

## 2014-01-09 DIAGNOSIS — Z792 Long term (current) use of antibiotics: Secondary | ICD-10-CM | POA: Diagnosis not present

## 2014-01-09 DIAGNOSIS — Z79899 Other long term (current) drug therapy: Secondary | ICD-10-CM | POA: Diagnosis not present

## 2014-01-09 DIAGNOSIS — Z880 Allergy status to penicillin: Secondary | ICD-10-CM | POA: Insufficient documentation

## 2014-01-09 DIAGNOSIS — M5430 Sciatica, unspecified side: Secondary | ICD-10-CM | POA: Insufficient documentation

## 2014-01-09 DIAGNOSIS — Z872 Personal history of diseases of the skin and subcutaneous tissue: Secondary | ICD-10-CM | POA: Insufficient documentation

## 2014-01-09 DIAGNOSIS — R350 Frequency of micturition: Secondary | ICD-10-CM | POA: Insufficient documentation

## 2014-01-09 DIAGNOSIS — M5431 Sciatica, right side: Secondary | ICD-10-CM

## 2014-01-09 DIAGNOSIS — I10 Essential (primary) hypertension: Secondary | ICD-10-CM | POA: Diagnosis not present

## 2014-01-09 DIAGNOSIS — M545 Low back pain, unspecified: Secondary | ICD-10-CM | POA: Insufficient documentation

## 2014-01-09 DIAGNOSIS — Z862 Personal history of diseases of the blood and blood-forming organs and certain disorders involving the immune mechanism: Secondary | ICD-10-CM | POA: Insufficient documentation

## 2014-01-09 DIAGNOSIS — F17200 Nicotine dependence, unspecified, uncomplicated: Secondary | ICD-10-CM | POA: Diagnosis not present

## 2014-01-09 DIAGNOSIS — R32 Unspecified urinary incontinence: Secondary | ICD-10-CM | POA: Diagnosis not present

## 2014-01-09 LAB — URINALYSIS, ROUTINE W REFLEX MICROSCOPIC
Bilirubin Urine: NEGATIVE
Glucose, UA: NEGATIVE mg/dL
Hgb urine dipstick: NEGATIVE
Ketones, ur: NEGATIVE mg/dL
Leukocytes, UA: NEGATIVE
Nitrite: NEGATIVE
Protein, ur: NEGATIVE mg/dL
Specific Gravity, Urine: 1.022 (ref 1.005–1.030)
Urobilinogen, UA: 1 mg/dL (ref 0.0–1.0)
pH: 6 (ref 5.0–8.0)

## 2014-01-09 MED ORDER — OXYBUTYNIN CHLORIDE ER 10 MG PO TB24: 10.0000 mg | ORAL_TABLET | Freq: Every day | ORAL | Status: DC

## 2014-01-09 NOTE — ED Provider Notes (Signed)
CSN: [id number]     Arrival date & time 01/09/14  1749 History   First MD Initiated Contact with Patient 01/09/14 1840     Chief Complaint  Patient presents with  . Back Pain  . Urinary Frequency     (Consider location/radiation/quality/duration/timing/severity/associated sxs/prior Treatment) Patient is a 38 y.o. female presenting with back pain and frequency. The history is provided by the patient. No language interpreter was used.  Back Pain Location:  Lumbar spine Quality:  Stabbing and shooting Radiates to:  R thigh Pain severity:  Moderate Pain is:  Same all the time Onset quality:  Gradual Duration:  1 month Associated symptoms: no abdominal pain and no fever   Associated symptoms comment:  She has had a one-month flare up of sciatic pain in the right leg. She also reports urinary incontinence for the same duration of time. She has had episodes of sciatic pain but states she has never had urinary incontinence. No bowel problems. She reports she has adequate pain management at home but was concerned about persistent symptoms.  Urinary Frequency Pertinent negatives include no abdominal pain, chills, fever, nausea or vomiting.    Past Medical History  Diagnosis Date  . Sebaceous cyst   . Abscess   . Anemia   . Hypertension   . Headache(784.0)     migraines   Past Surgical History  Procedure Laterality Date  . Partial hysterectomy    . Cholecystectomy open    . Ovarian cyst removal    . R arm surgery    . Gastric bypass  2011    gastric sleeve  . Knee surgery  2005    left  . Cystectomy      2004 axilla bil  . Abdominal hysterectomy    . Medial patellofemoral ligament repair Left 06/08/2013    Procedure: DIAGNOSTIC OPERATIVE ARTHROSCOPY, OPEN MEDIAL PATELLA FEMORAL LIGAMENT RECONSTRUCTION;  Surgeon: Meredith Pel, MD;  Location: [facility];  Service: Orthopedics;  Laterality: Left;  with Hamstring autograft  . Fracture surgery      rt arm   Family History   Problem Relation Age of Onset  . Cancer Maternal Grandfather     lung   History  Substance Use Topics  . Smoking status: Current Every Day Smoker -- 0.25 packs/day for 10 years    Types: Cigarettes  . Smokeless tobacco: Never Used  . Alcohol Use: Yes   OB History   Grav Para Term Preterm Abortions TAB SAB Ect Mult Living                 Review of Systems  Constitutional: Negative for fever and chills.  Gastrointestinal: Negative.  Negative for nausea, vomiting and abdominal pain.  Genitourinary: Positive for frequency and difficulty urinating.  Musculoskeletal: Positive for back pain.  Skin: Negative.   Neurological: Negative.        She complains of minor numbness in the right lower extremity.      Allergies  Penicillins  Home Medications   Prior to Admission medications   Medication Sig Start Date End Date Taking? Authorizing Provider  acetaminophen (TYLENOL) 500 MG tablet Take 500 mg by mouth every 8 (eight) hours as needed for mild pain or headache.    Historical Provider, MD  cyclobenzaprine (FLEXERIL) 10 MG tablet Take 10 mg by mouth 3 (three) times daily as needed for muscle spasms.    Historical Provider, MD  diazepam (VALIUM) 5 MG tablet Take 1 tablet (5 mg total) by mouth  every 8 (eight) hours as needed for anxiety. 10/17/13   Garald Balding, NP  doxycycline (VIBRA-TABS) 100 MG tablet Take 1 tablet (100 mg total) by mouth 2 (two) times daily. 12/03/13   Odis Hollingshead, MD  gabapentin (NEURONTIN) 100 MG capsule Take 100 mg by mouth 2 (two) times daily.    Historical Provider, MD  hydrochlorothiazide (HYDRODIURIL) 25 MG tablet Take 25 mg by mouth daily.    Historical Provider, MD  ibuprofen (ADVIL,MOTRIN) 600 MG tablet Take 1 tablet (600 mg total) by mouth every 8 (eight) hours as needed. 10/19/13   Hoy Morn, MD  Menthol-Methyl Salicylate (BEN GAY GREASELESS) 10-15 % greaseless cream Apply topically at bedtime.    Historical Provider, MD  oxyCODONE  (ROXICODONE) 5 MG immediate release tablet Take 1 tablet (5 mg total) by mouth every 4 (four) hours as needed for severe pain. 10/17/13   Garald Balding, NP  oxyCODONE-acetaminophen (PERCOCET) 10-325 MG per tablet Take 1 tablet by mouth every 4 (four) hours as needed for pain. 12/02/13   Karen Chafe Molpus, MD  oxyCODONE-acetaminophen (PERCOCET/ROXICET) 5-325 MG per tablet Take 1-2 tablets by mouth every 4 (four) hours as needed for severe pain. 09/29/13   Severa Jeremiah A Henessy Rohrer, PA-C  temazepam (RESTORIL) 15 MG capsule Take 15 mg by mouth at bedtime as needed for sleep.    Historical Provider, MD  topiramate (TOPAMAX) 50 MG tablet Take 50 mg by mouth 2 (two) times daily.    Historical Provider, MD   BP 158/100  Pulse 79  Temp(Src) 98 F (36.7 C) (Oral)  Resp 20  Ht 6' (1.829 m)  Wt 205 lb (92.987 kg)  BMI 27.80 kg/m2  SpO2 99% Physical Exam  Constitutional: She is oriented to person, place, and time. She appears well-developed and well-nourished.  HENT:  Head: Normocephalic.  Neck: Normal range of motion. Neck supple.  Cardiovascular: Normal rate and regular rhythm.   Pulmonary/Chest: Effort normal and breath sounds normal.  Abdominal: Soft. Bowel sounds are normal. There is no tenderness. There is no rebound and no guarding.  Musculoskeletal: Normal range of motion.  Mild lumbar and right paralumbar tenderness without swelling or discoloration. Right sciatic tenderness.   Neurological: She is alert and oriented to person, place, and time. She has normal reflexes. Coordination normal.  Skin: Skin is warm and dry. No rash noted.  Psychiatric: She has a normal mood and affect.    ED Course  Procedures (including critical care time) Labs Review Labs Reviewed  URINALYSIS, ROUTINE W REFLEX MICROSCOPIC    Imaging Review No results found.   EKG Interpretation None      MDM   Final diagnoses:  None    1. Sciatica, right 2. Urinary incontinence  Symptoms of one month duration, including  urinary incontinence. She will need an outpatient MRI and this will be arranged outpatient at [facility] for tomorrow. Will have her follow up with Dr. Berenice Primas for results. Urology referral made in the event no cause for incontinence found on MR. Patient understands and agrees with plan.    Dewaine Oats, PA-C 01/09/14 1930

## 2014-01-09 NOTE — ED Notes (Signed)
Pt had a back injury in June and for the past week she has had increasing "sciatica pain" and inability to hold her urine.  No weakness or numbness in extremities.

## 2014-01-09 NOTE — ED Notes (Signed)
Patient states she has been using oxytrol patch in an effort to regain bladder control

## 2014-01-09 NOTE — Discharge Instructions (Signed)
Urinary Incontinence Urinary incontinence is the involuntary loss of urine from your bladder. CAUSES  There are many causes of urinary incontinence. They include:  Medicines.  Infections.  Prostatic enlargement, leading to overflow of urine from your bladder.  Surgery.  Neurological diseases.  Emotional factors. SIGNS AND SYMPTOMS Urinary Incontinence can be divided into four types: 1. Urge incontinence. Urge incontinence is the involuntary loss of urine before you have the opportunity to go to the bathroom. There is a sudden urge to void but not enough time to reach a bathroom. 2. Stress incontinence. Stress incontinence is the sudden loss of urine with any activity that forces urine to pass. It is commonly caused by anatomical changes to the pelvis and sphincter areas of your body. 3. Overflow incontinence. Overflow incontinence is the loss of urine from an obstructed opening to your bladder. This results in a backup of urine and a resultant buildup of pressure within the bladder. When the pressure within the bladder exceeds the closing pressure of the sphincter, the urine overflows, which causes incontinence, similar to water overflowing a dam. 4. Total incontinence. Total incontinence is the loss of urine as a result of the inability to store urine within your bladder. DIAGNOSIS  Evaluating the cause of incontinence may require:  A thorough and complete medical and obstetric history.  A complete physical exam.  Laboratory tests such as a urine culture and sensitivities. When additional tests are indicated, they can include:  An ultrasound exam.  Kidney and bladder X-rays.  Cystoscopy. This is an exam of the bladder using a narrow scope.  Urodynamic testing to test the nerve function to the bladder and sphincter areas. TREATMENT  Treatment for urinary incontinence depends on the cause:  For urge incontinence caused by a bacterial infection, antibiotics will be prescribed.  If the urge incontinence is related to medicines you take, your health care provider may have you change the medicine.  For stress incontinence, surgery to re-establish anatomical support to the bladder or sphincter, or both, will often correct the condition.  For overflow incontinence caused by an enlarged prostate, an operation to open the channel through the enlarged prostate will allow the flow of urine out of the bladder. In women with fibroids, a hysterectomy may be recommended.  For total incontinence, surgery on your urinary sphincter may help. An artificial urinary sphincter (an inflatable cuff placed around the urethra) may be required. In women who have developed a hole-like passage between their bladder and vagina (vesicovaginal fistula), surgery to close the fistula often is required. HOME CARE INSTRUCTIONS  Normal daily hygiene and the use of pads or adult diapers that are changed regularly will help prevent odors and skin damage.  Avoid caffeine. It can overstimulate your bladder.  Use the bathroom regularly. Try about every 2-3 hours to go to the bathroom, even if you do not feel the need to do so. Take time to empty your bladder completely. After urinating, wait a minute. Then try to urinate again.  For causes involving nerve dysfunction, keep a log of the medicines you take and a journal of the times you go to the bathroom. SEEK MEDICAL CARE IF:  You experience worsening of pain instead of improvement in pain after your procedure.  Your incontinence becomes worse instead of better. SEE IMMEDIATE MEDICAL CARE IF:  You experience fever or shaking chills.  You are unable to pass your urine.  You have redness spreading into your groin or down into your thighs. MAKE SURE  YOU:   Understand these instructions.   Will watch your condition.  Will get help right away if you are not doing well or get worse. Document Released: 06/13/2004 Document Revised: 02/24/2013 Document  Reviewed: 10/13/2012 [facility] Patient Information 2015 [place], [place]. This information is not intended to replace advice given to you by your health care provider. Make sure you discuss any questions you have with your health care provider. Sciatica Sciatica is pain, weakness, numbness, or tingling along the path of the sciatic nerve. The nerve starts in the lower back and runs down the back of each leg. The nerve controls the muscles in the lower leg and in the back of the knee, while also providing sensation to the back of the thigh, lower leg, and the sole of your foot. Sciatica is a symptom of another medical condition. For instance, nerve damage or certain conditions, such as a herniated disk or bone spur on the spine, pinch or put pressure on the sciatic nerve. This causes the pain, weakness, or other sensations normally associated with sciatica. Generally, sciatica only affects one side of the body. CAUSES   Herniated or slipped disc.  Degenerative disk disease.  A pain disorder involving the narrow muscle in the buttocks (piriformis syndrome).  Pelvic injury or fracture.  Pregnancy.  Tumor (rare). SYMPTOMS  Symptoms can vary from mild to very severe. The symptoms usually travel from the low back to the buttocks and down the back of the leg. Symptoms can include:  Mild tingling or dull aches in the lower back, leg, or hip.  Numbness in the back of the calf or sole of the foot.  Burning sensations in the lower back, leg, or hip.  Sharp pains in the lower back, leg, or hip.  Leg weakness.  Severe back pain inhibiting movement. These symptoms may get worse with coughing, sneezing, laughing, or prolonged sitting or standing. Also, being overweight may worsen symptoms. DIAGNOSIS  Your caregiver will perform a physical exam to look for common symptoms of sciatica. He or she may ask you to do certain movements or activities that would trigger sciatic nerve pain. Other tests may be  performed to find the cause of the sciatica. These may include:  Blood tests.  X-rays.  Imaging tests, such as an MRI or CT scan. TREATMENT  Treatment is directed at the cause of the sciatic pain. Sometimes, treatment is not necessary and the pain and discomfort goes away on its own. If treatment is needed, your caregiver may suggest:  Over-the-counter medicines to relieve pain.  Prescription medicines, such as anti-inflammatory medicine, muscle relaxants, or narcotics.  Applying heat or ice to the painful area.  Steroid injections to lessen pain, irritation, and inflammation around the nerve.  Reducing activity during periods of pain.  Exercising and stretching to strengthen your abdomen and improve flexibility of your spine. Your caregiver may suggest losing weight if the extra weight makes the back pain worse.  Physical therapy.  Surgery to eliminate what is pressing or pinching the nerve, such as a bone spur or part of a herniated disk. HOME CARE INSTRUCTIONS   Only take over-the-counter or prescription medicines for pain or discomfort as directed by your caregiver.  Apply ice to the affected area for 20 minutes, 3-4 times a day for the first 48-72 hours. Then try heat in the same way.  Exercise, stretch, or perform your usual activities if these do not aggravate your pain.  Attend physical therapy sessions as directed by your  caregiver.  Keep all follow-up appointments as directed by your caregiver.  Do not wear high heels or shoes that do not provide proper support.  Check your mattress to see if it is too soft. A firm mattress may lessen your pain and discomfort. SEEK IMMEDIATE MEDICAL CARE IF:   You lose control of your bowel or bladder (incontinence).  You have increasing weakness in the lower back, pelvis, buttocks, or legs.  You have redness or swelling of your back.  You have a burning sensation when you urinate.  You have pain that gets worse when you  lie down or awakens you at night.  Your pain is worse than you have experienced in the past.  Your pain is lasting longer than 4 weeks.  You are suddenly losing weight without reason. MAKE SURE YOU:  Understand these instructions.  Will watch your condition.  Will get help right away if you are not doing well or get worse. Document Released: 04/30/2001 Document Revised: 11/05/2011 Document Reviewed: 09/15/2011 [facility] Patient Information 2015 [place], [place]. This information is not intended to replace advice given to you by your health care provider. Make sure you discuss any questions you have with your health care provider.

## 2014-01-11 NOTE — ED Provider Notes (Signed)
Medical screening examination/treatment/procedure(s) were performed by non-physician practitioner and as supervising physician I was immediately available for consultation/collaboration.   EKG Interpretation None      Pt discussed with PA Upstill.  I agree with plan.  Tanna Furry, MD 01/11/14 [phone]

## 2014-01-12 ENCOUNTER — Ambulatory Visit (INDEPENDENT_AMBULATORY_CARE_PROVIDER_SITE_OTHER): Payer: Medicaid Other | Admitting: General Surgery

## 2014-01-12 VITALS — BP 150/100 | HR 85 | Temp 98.9°F | Ht 72.0 in | Wt 214.4 lb

## 2014-01-12 DIAGNOSIS — L0501 Pilonidal cyst with abscess: Secondary | ICD-10-CM

## 2014-01-12 MED ORDER — DOXYCYCLINE HYCLATE 100 MG PO TABS: 100.0000 mg | ORAL_TABLET | Freq: Two times a day (BID) | ORAL | Status: DC

## 2014-01-12 NOTE — Progress Notes (Signed)
Subjective:     Patient ID: [name], female   DOB: 1975/11/05, 38 y.o.   MRN: [id number]  HPI  She is here for followup of her recurrent, infected pilonidal cyst status post incision and drainage. The area healed but then reopened and drained some. Minimal discomfort now. There is no drainage.   Review of Systemsas above     Objective:   Physical Exam Gen.-overweight female in no acute distress.  Gluteal cleft-small sinus tract noted inferiorly. Upper gluteal cleft scar with some firmness present but no fluctuance or erythema.    Assessment:     Recurrent, infected pilonidal cyst.     Plan:     Extensive redo pilonidal cystectomy. The procedure, rationale, and risks were discussed with her. The risks include but not limited to bleeding, infection, need for reoperation, recurrence, anesthesia, prolonged wound healing. She seem to understand all these and would like to proceed.

## 2014-01-12 NOTE — Patient Instructions (Signed)
We will call you to schedule the surgery.

## 2014-03-20 ENCOUNTER — Emergency Department (HOSPITAL_BASED_OUTPATIENT_CLINIC_OR_DEPARTMENT_OTHER)
Admission: EM | Admit: 2014-03-20 | Discharge: 2014-03-20 | Disposition: A | Discharge status: Home / Self Care | Payer: Medicaid Other | Attending: Emergency Medicine | Admitting: Emergency Medicine

## 2014-03-20 DIAGNOSIS — L0231 Cutaneous abscess of buttock: Secondary | ICD-10-CM | POA: Diagnosis present

## 2014-03-20 DIAGNOSIS — Z720 Tobacco use: Secondary | ICD-10-CM | POA: Insufficient documentation

## 2014-03-20 DIAGNOSIS — R509 Fever, unspecified: Secondary | ICD-10-CM | POA: Insufficient documentation

## 2014-03-20 DIAGNOSIS — Z880 Allergy status to penicillin: Secondary | ICD-10-CM | POA: Diagnosis not present

## 2014-03-20 NOTE — ED Provider Notes (Signed)
See Epic downtime paperwork.   Erin Fines, MD 03/20/14 [phone]

## 2014-04-18 ENCOUNTER — Encounter (HOSPITAL_BASED_OUTPATIENT_CLINIC_OR_DEPARTMENT_OTHER): Payer: Self-pay | Admitting: *Deleted

## 2014-04-18 ENCOUNTER — Emergency Department (HOSPITAL_BASED_OUTPATIENT_CLINIC_OR_DEPARTMENT_OTHER): Payer: Medicaid Other

## 2014-04-18 ENCOUNTER — Emergency Department (HOSPITAL_BASED_OUTPATIENT_CLINIC_OR_DEPARTMENT_OTHER)
Admission: EM | Admit: 2014-04-18 | Discharge: 2014-04-18 | Disposition: A | Discharge status: Home / Self Care | Payer: Medicaid Other | Attending: Emergency Medicine | Admitting: Emergency Medicine

## 2014-04-18 DIAGNOSIS — Z79899 Other long term (current) drug therapy: Secondary | ICD-10-CM | POA: Insufficient documentation

## 2014-04-18 DIAGNOSIS — Z9889 Other specified postprocedural states: Secondary | ICD-10-CM | POA: Insufficient documentation

## 2014-04-18 DIAGNOSIS — G43909 Migraine, unspecified, not intractable, without status migrainosus: Secondary | ICD-10-CM | POA: Insufficient documentation

## 2014-04-18 DIAGNOSIS — M25562 Pain in left knee: Secondary | ICD-10-CM | POA: Diagnosis present

## 2014-04-18 DIAGNOSIS — Z880 Allergy status to penicillin: Secondary | ICD-10-CM | POA: Insufficient documentation

## 2014-04-18 DIAGNOSIS — I10 Essential (primary) hypertension: Secondary | ICD-10-CM | POA: Insufficient documentation

## 2014-04-18 DIAGNOSIS — Z862 Personal history of diseases of the blood and blood-forming organs and certain disorders involving the immune mechanism: Secondary | ICD-10-CM | POA: Insufficient documentation

## 2014-04-18 DIAGNOSIS — Z720 Tobacco use: Secondary | ICD-10-CM | POA: Insufficient documentation

## 2014-04-18 DIAGNOSIS — R52 Pain, unspecified: Secondary | ICD-10-CM

## 2014-04-18 DIAGNOSIS — Z872 Personal history of diseases of the skin and subcutaneous tissue: Secondary | ICD-10-CM | POA: Insufficient documentation

## 2014-04-18 DIAGNOSIS — M25462 Effusion, left knee: Secondary | ICD-10-CM | POA: Diagnosis not present

## 2014-04-18 MED ORDER — IBUPROFEN 800 MG PO TABS: 800.0000 mg | ORAL_TABLET | Freq: Three times a day (TID) | ORAL | Status: DC

## 2014-04-18 MED ORDER — HYDROCODONE-ACETAMINOPHEN 5-325 MG PO TABS
2.0000 | ORAL_TABLET | Freq: Once | ORAL | Status: AC
  Administered 2014-04-18: 2 via ORAL
  Filled 2014-04-18: qty 2

## 2014-04-18 MED ORDER — HYDROCODONE-ACETAMINOPHEN 5-325 MG PO TABS: 2.0000 | ORAL_TABLET | ORAL | Status: DC | PRN

## 2014-04-18 NOTE — ED Provider Notes (Signed)
CSN: [id number]     Arrival date & time 04/18/14  1826 History   First MD Initiated Contact with Patient 04/18/14 2001     Chief Complaint  Patient presents with  . Knee Pain     (Consider location/radiation/quality/duration/timing/severity/associated sxs/prior Treatment) Patient is a 38 y.o. female presenting with knee pain. The history is provided by the patient. No language interpreter was used.  Knee Pain Location:  Knee Time since incident:  2 days Injury: no   Knee location:  L knee Pain details:    Quality:  Aching   Radiates to:  Does not radiate   Severity:  Moderate   Onset quality:  Gradual   Duration:  2 days   Timing:  Constant   Progression:  Worsening Chronicity:  New Dislocation: no   Foreign body present:  No foreign bodies Tetanus status:  Unknown Relieved by:  Nothing Ineffective treatments:  None tried Associated symptoms: swelling   Risk factors: no concern for non-accidental trauma     Past Medical History  Diagnosis Date  . Sebaceous cyst   . Abscess   . Anemia   . Hypertension   . Headache(784.0)     migraines   Past Surgical History  Procedure Laterality Date  . Partial hysterectomy    . Cholecystectomy open    . Ovarian cyst removal    . R arm surgery    . Gastric bypass  2011    gastric sleeve  . Knee surgery  2005    left  . Cystectomy      2004 axilla bil  . Abdominal hysterectomy    . Medial patellofemoral ligament repair Left 06/08/2013    Procedure: DIAGNOSTIC OPERATIVE ARTHROSCOPY, OPEN MEDIAL PATELLA FEMORAL LIGAMENT RECONSTRUCTION;  Surgeon: Meredith Pel, MD;  Location: [facility];  Service: Orthopedics;  Laterality: Left;  with Hamstring autograft  . Fracture surgery      rt arm   Family History  Problem Relation Age of Onset  . Cancer Maternal Grandfather     lung   History  Substance Use Topics  . Smoking status: Current Every Day Smoker -- 0.25 packs/day for 10 years    Types: Cigarettes  . Smokeless  tobacco: Never Used  . Alcohol Use: Yes   OB History    No data available     Review of Systems  Musculoskeletal: Positive for joint swelling.  All other systems reviewed and are negative.     Allergies  Penicillins  Home Medications   Prior to Admission medications   Medication Sig Start Date End Date Taking? Authorizing Provider  acetaminophen (TYLENOL) 500 MG tablet Take 500 mg by mouth every 8 (eight) hours as needed for mild pain or headache.    Historical Provider, MD  cyclobenzaprine (FLEXERIL) 10 MG tablet Take 10 mg by mouth 3 (three) times daily as needed for muscle spasms.    Historical Provider, MD  gabapentin (NEURONTIN) 100 MG capsule Take 100 mg by mouth 2 (two) times daily.    Historical Provider, MD  Menthol-Methyl Salicylate (BEN GAY GREASELESS) 10-15 % greaseless cream Apply topically at bedtime.    Historical Provider, MD  oxyCODONE-acetaminophen (PERCOCET/ROXICET) 5-325 MG per tablet Take 1-2 tablets by mouth every 4 (four) hours as needed for severe pain. 09/29/13   Shari A Upstill, PA-C  temazepam (RESTORIL) 15 MG capsule Take 15 mg by mouth at bedtime as needed for sleep.    Historical Provider, MD  topiramate (TOPAMAX) 50 MG tablet Take  50 mg by mouth 2 (two) times daily.    Historical Provider, MD   BP 168/108 mmHg  Pulse 77  Temp(Src) 99.1 F (37.3 C) (Oral)  Resp 18  Ht 6' (1.829 m)  Wt 220 lb (99.791 kg)  BMI 29.83 kg/m2  SpO2 99% Physical Exam  Constitutional: She is oriented to person, place, and time. She appears well-developed and well-nourished.  HENT:  Head: Normocephalic.  Musculoskeletal: She exhibits tenderness.  Swollen left knee effusion  Decreased range of motion,  nv and ns intact    Neurological: She is alert and oriented to person, place, and time. She has normal reflexes.  Skin: Skin is warm.  Psychiatric: She has a normal mood and affect.  Nursing note and vitals reviewed.   ED Course  Procedures (including critical care  time) Labs Review Labs Reviewed - No data to display  Imaging Review Dg Knee 1-2 Views Left  04/18/2014   CLINICAL DATA:  Left knee pain since this morning.  No known injury.  EXAM: LEFT KNEE - 1-2 VIEW  COMPARISON:  Intraoperative radiographs of the left knee - 06/08/2013  FINDINGS: Stable postoperative change of the distal femoral metaphysis and patella. Moderate-sized joint effusion is suspected. No fracture or dislocation. Joint spaces are preserved. No evidence of chondrocalcinosis.  IMPRESSION: 1. Suspected moderate-sized joint effusion. Otherwise, no acute findings. 2. Postoperative change of the distal femoral metaphysis and patella.   Electronically Signed   By: Sandi Mariscal M.D.   On: 04/18/2014 20:54     EKG Interpretation None      MDM   Final diagnoses:  Pain  Effusion of left knee    Ace wrap Knee imbolizer Hydrocodone Ibuprofen Pt advised to schedule to see Dr. Marlou Sa for evaluation    Fransico Meadow, PA-C 04/18/14 2124  Debby Freiberg, MD 04/24/14 [phone]

## 2014-04-18 NOTE — Discharge Instructions (Signed)

## 2014-04-18 NOTE — ED Notes (Signed)
(  L) knee pain and swelling x 1 day.  Ambulatory.

## 2014-06-15 ENCOUNTER — Encounter (HOSPITAL_BASED_OUTPATIENT_CLINIC_OR_DEPARTMENT_OTHER): Payer: Self-pay | Admitting: *Deleted

## 2014-06-15 ENCOUNTER — Emergency Department (HOSPITAL_BASED_OUTPATIENT_CLINIC_OR_DEPARTMENT_OTHER)
Admission: EM | Admit: 2014-06-15 | Discharge: 2014-06-15 | Disposition: A | Discharge status: Home / Self Care | Payer: Medicaid Other | Attending: Emergency Medicine | Admitting: Emergency Medicine

## 2014-06-15 ENCOUNTER — Emergency Department (HOSPITAL_BASED_OUTPATIENT_CLINIC_OR_DEPARTMENT_OTHER): Payer: Medicaid Other

## 2014-06-15 DIAGNOSIS — Z720 Tobacco use: Secondary | ICD-10-CM | POA: Diagnosis not present

## 2014-06-15 DIAGNOSIS — R079 Chest pain, unspecified: Secondary | ICD-10-CM | POA: Diagnosis present

## 2014-06-15 DIAGNOSIS — Z872 Personal history of diseases of the skin and subcutaneous tissue: Secondary | ICD-10-CM | POA: Diagnosis not present

## 2014-06-15 DIAGNOSIS — J209 Acute bronchitis, unspecified: Secondary | ICD-10-CM | POA: Diagnosis not present

## 2014-06-15 DIAGNOSIS — Z79899 Other long term (current) drug therapy: Secondary | ICD-10-CM | POA: Insufficient documentation

## 2014-06-15 DIAGNOSIS — Z791 Long term (current) use of non-steroidal anti-inflammatories (NSAID): Secondary | ICD-10-CM | POA: Diagnosis not present

## 2014-06-15 DIAGNOSIS — I10 Essential (primary) hypertension: Secondary | ICD-10-CM | POA: Diagnosis not present

## 2014-06-15 DIAGNOSIS — Z880 Allergy status to penicillin: Secondary | ICD-10-CM | POA: Diagnosis not present

## 2014-06-15 DIAGNOSIS — Z862 Personal history of diseases of the blood and blood-forming organs and certain disorders involving the immune mechanism: Secondary | ICD-10-CM | POA: Insufficient documentation

## 2014-06-15 DIAGNOSIS — R0789 Other chest pain: Secondary | ICD-10-CM | POA: Diagnosis not present

## 2014-06-15 LAB — CBC WITH DIFFERENTIAL/PLATELET
BASOS PCT: 0 % (ref 0–1)
Basophils Absolute: 0 10*3/uL (ref 0.0–0.1)
EOS PCT: 2 % (ref 0–5)
Eosinophils Absolute: 0.1 10*3/uL (ref 0.0–0.7)
HCT: 42.4 % (ref 36.0–46.0)
HEMOGLOBIN: 14.3 g/dL (ref 12.0–15.0)
LYMPHS PCT: 47 % — AB (ref 12–46)
Lymphs Abs: 3.2 10*3/uL (ref 0.7–4.0)
MCH: 32 pg (ref 26.0–34.0)
MCHC: 33.7 g/dL (ref 30.0–36.0)
MCV: 94.9 fL (ref 78.0–100.0)
Monocytes Absolute: 0.4 10*3/uL (ref 0.1–1.0)
Monocytes Relative: 6 % (ref 3–12)
NEUTROS ABS: 3.1 10*3/uL (ref 1.7–7.7)
Neutrophils Relative %: 45 % (ref 43–77)
Platelets: 253 10*3/uL (ref 150–400)
RBC: 4.47 MIL/uL (ref 3.87–5.11)
RDW: 13.9 % (ref 11.5–15.5)
WBC: 6.8 10*3/uL (ref 4.0–10.5)

## 2014-06-15 LAB — COMPREHENSIVE METABOLIC PANEL
ALT: 13 U/L (ref 0–35)
AST: 17 U/L (ref 0–37)
Albumin: 3.2 g/dL — ABNORMAL LOW (ref 3.5–5.2)
Alkaline Phosphatase: 64 U/L (ref 39–117)
Anion gap: 1 — ABNORMAL LOW (ref 5–15)
BUN: 13 mg/dL (ref 6–23)
CALCIUM: 8.1 mg/dL — AB (ref 8.4–10.5)
CO2: 27 mmol/L (ref 19–32)
Chloride: 110 mmol/L (ref 96–112)
Creatinine, Ser: 0.85 mg/dL (ref 0.50–1.10)
GFR calc Af Amer: >90 mL/min (ref >90)
GFR calc non Af Amer: 86 mL/min — ABNORMAL LOW (ref >90)
Glucose, Bld: 93 mg/dL (ref 70–99)
POTASSIUM: 3.8 mmol/L (ref 3.5–5.1)
Sodium: 138 mmol/L (ref 135–145)
TOTAL PROTEIN: 6.4 g/dL (ref 6.0–8.3)
Total Bilirubin: 0.3 mg/dL (ref 0.3–1.2)

## 2014-06-15 LAB — TROPONIN I: Troponin I: <0.03 ng/mL (ref <0.031)

## 2014-06-15 MED ORDER — IPRATROPIUM-ALBUTEROL 0.5-2.5 (3) MG/3ML IN SOLN
3.0000 mL | RESPIRATORY_TRACT | Status: DC
  Administered 2014-06-15: 3 mL via RESPIRATORY_TRACT
  Filled 2014-06-15: qty 3

## 2014-06-15 MED ORDER — ALBUTEROL SULFATE (2.5 MG/3ML) 0.083% IN NEBU
2.5000 mg | INHALATION_SOLUTION | Freq: Once | RESPIRATORY_TRACT | Status: AC
  Administered 2014-06-15: 2.5 mg via RESPIRATORY_TRACT
  Filled 2014-06-15: qty 3

## 2014-06-15 MED ORDER — ALBUTEROL SULFATE HFA 108 (90 BASE) MCG/ACT IN AERS
2.0000 | INHALATION_SPRAY | RESPIRATORY_TRACT | Status: DC | PRN
  Administered 2014-06-15: 2 via RESPIRATORY_TRACT
  Filled 2014-06-15: qty 6.7

## 2014-06-15 NOTE — Discharge Instructions (Signed)

## 2014-06-15 NOTE — ED Notes (Signed)
Pt c/o left sided chest pain which radiates  to back and down left arm x 30 mins

## 2014-06-15 NOTE — ED Provider Notes (Signed)
CSN: [id number]     Arrival date & time 06/15/14  0009 History   First MD Initiated Contact with Patient 06/15/14 0224     Chief Complaint  Patient presents with  . Chest Pain     (Consider location/radiation/quality/duration/timing/severity/associated sxs/prior Treatment) HPI  This is a 39 year old female with one-week history of cold symptoms. Specifically she's had cough, nasal congestion, subjective fever and scratchy throat. She also has shortness of breath which she describes as a sensation that she needs to cough something up but it will not come out. Several hours ago she developed pain in her left upper chest and shoulder. The pain is sharp, radiates to her back, and is worse with movement. It is moderate in severity. It is not worse with deep breaths. She does not have a history of asthma and is never used an inhaler.  Past Medical History  Diagnosis Date  . Sebaceous cyst   . Abscess   . Anemia   . Hypertension   . Headache(784.0)     migraines   Past Surgical History  Procedure Laterality Date  . Partial hysterectomy    . Cholecystectomy open    . Ovarian cyst removal    . R arm surgery    . Gastric bypass  2011    gastric sleeve  . Knee surgery  2005    left  . Cystectomy      2004 axilla bil  . Abdominal hysterectomy    . Medial patellofemoral ligament repair Left 06/08/2013    Procedure: DIAGNOSTIC OPERATIVE ARTHROSCOPY, OPEN MEDIAL PATELLA FEMORAL LIGAMENT RECONSTRUCTION;  Surgeon: Meredith Pel, MD;  Location: [facility];  Service: Orthopedics;  Laterality: Left;  with Hamstring autograft  . Fracture surgery      rt arm   Family History  Problem Relation Age of Onset  . Cancer Maternal Grandfather     lung   History  Substance Use Topics  . Smoking status: Current Every Day Smoker -- 0.50 packs/day for 10 years    Types: Cigarettes  . Smokeless tobacco: Never Used  . Alcohol Use: Yes   OB History    No data available     Review of Systems  All  other systems reviewed and are negative.   Allergies  Penicillins  Home Medications   Prior to Admission medications   Medication Sig Start Date End Date Taking? Authorizing Provider  amLODipine (NORVASC) 10 MG tablet Take 10 mg by mouth daily.   Yes Historical Provider, MD  acetaminophen (TYLENOL) 500 MG tablet Take 500 mg by mouth every 8 (eight) hours as needed for mild pain or headache.    Historical Provider, MD  cyclobenzaprine (FLEXERIL) 10 MG tablet Take 10 mg by mouth 3 (three) times daily as needed for muscle spasms.    Historical Provider, MD  gabapentin (NEURONTIN) 100 MG capsule Take 100 mg by mouth 2 (two) times daily.    Historical Provider, MD  HYDROcodone-acetaminophen (NORCO/VICODIN) 5-325 MG per tablet Take 2 tablets by mouth every 4 (four) hours as needed. 04/18/14   Fransico Meadow, PA-C  ibuprofen (ADVIL,MOTRIN) 800 MG tablet Take 1 tablet (800 mg total) by mouth 3 (three) times daily. 04/18/14   Fransico Meadow, PA-C  Menthol-Methyl Salicylate (BEN GAY GREASELESS) 10-15 % greaseless cream Apply topically at bedtime.    Historical Provider, MD  oxyCODONE-acetaminophen (PERCOCET/ROXICET) 5-325 MG per tablet Take 1-2 tablets by mouth every 4 (four) hours as needed for severe pain. 09/29/13   Nehemiah Settle  A Upstill, PA-C  temazepam (RESTORIL) 15 MG capsule Take 15 mg by mouth at bedtime as needed for sleep.    Historical Provider, MD  topiramate (TOPAMAX) 50 MG tablet Take 50 mg by mouth 2 (two) times daily.    Historical Provider, MD   BP 168/97 mmHg  Pulse 68  Temp(Src) 98.3 F (36.8 C) (Oral)  Resp 20  Ht 6' (1.829 m)  Wt 215 lb (97.523 kg)  BMI 29.15 kg/m2  SpO2 98%   Physical Exam  General: Well-developed, well-nourished female in no acute distress; appearance consistent with age of record HENT: normocephalic; atraumatic Eyes: pupils equal, round and reactive to light; extraocular muscles intact Neck: supple Heart: regular rate and rhythm Lungs: Expiratory  wheezes Chest: No tenderness to palpation but pain on movement of upper trunk Abdomen: soft; nondistended; nontender; bowel sounds present Extremities: No deformity; full range of motion; pulses normal Neurologic: Awake, alert and oriented; motor function intact in all extremities and symmetric; no facial droop Skin: Warm and dry Psychiatric: Normal mood and affect    ED Course  Procedures (including critical care time)   EKG Interpretation   Date/Time:  Wednesday June 15 2014 00:22:20 EST Ventricular Rate:  74 PR Interval:  146 QRS Duration: 82 QT Interval:  392 QTC Calculation: 435 R Axis:   88 Text Interpretation:  Normal sinus rhythm Right atrial enlargement  Borderline ECG No significant change was found Confirmed by Shaylinn Hladik  MD,  Jenny Reichmann ([id number]) on 06/15/2014 1:21:32 AM      MDM   Nursing notes and vitals signs, including pulse oximetry, reviewed.  Summary of this visit's results, reviewed by myself:  Labs:  Results for orders placed or performed during the hospital encounter of 06/15/14 (from the past 24 hour(s))  Troponin I     Status: None   Collection Time: 06/15/14 12:35 AM  Result Value Ref Range   Troponin I <0.03 <0.031 ng/mL  Comprehensive metabolic panel     Status: Abnormal   Collection Time: 06/15/14 12:35 AM  Result Value Ref Range   Sodium 138 135 - 145 mmol/L   Potassium 3.8 3.5 - 5.1 mmol/L   Chloride 110 96 - 112 mmol/L   CO2 27 19 - 32 mmol/L   Glucose, Bld 93 70 - 99 mg/dL   BUN 13 6 - 23 mg/dL   Creatinine, Ser 0.85 0.50 - 1.10 mg/dL   Calcium 8.1 (L) 8.4 - 10.5 mg/dL   Total Protein 6.4 6.0 - 8.3 g/dL   Albumin 3.2 (L) 3.5 - 5.2 g/dL   AST 17 0 - 37 U/L   ALT 13 0 - 35 U/L   Alkaline Phosphatase 64 39 - 117 U/L   Total Bilirubin 0.3 0.3 - 1.2 mg/dL   GFR calc non Af Amer 86 (L) >90 mL/min   GFR calc Af Amer >90 >90 mL/min   Anion gap 1 (L) 5 - 15  CBC with Differential     Status: Abnormal   Collection Time: 06/15/14 12:35 AM   Result Value Ref Range   WBC 6.8 4.0 - 10.5 K/uL   RBC 4.47 3.87 - 5.11 MIL/uL   Hemoglobin 14.3 12.0 - 15.0 g/dL   HCT 42.4 36.0 - 46.0 %   MCV 94.9 78.0 - 100.0 fL   MCH 32.0 26.0 - 34.0 pg   MCHC 33.7 30.0 - 36.0 g/dL   RDW 13.9 11.5 - 15.5 %   Platelets 253 150 - 400 K/uL   Neutrophils Relative %  45 43 - 77 %   Neutro Abs 3.1 1.7 - 7.7 K/uL   Lymphocytes Relative 47 (H) 12 - 46 %   Lymphs Abs 3.2 0.7 - 4.0 K/uL   Monocytes Relative 6 3 - 12 %   Monocytes Absolute 0.4 0.1 - 1.0 K/uL   Eosinophils Relative 2 0 - 5 %   Eosinophils Absolute 0.1 0.0 - 0.7 K/uL   Basophils Relative 0 0 - 1 %   Basophils Absolute 0.0 0.0 - 0.1 K/uL    Imaging Studies: Dg Chest 2 View  06/15/2014   CLINICAL DATA:  Cough and congestion for a few days. Left sided chest pain radiating to the left axilla today.  EXAM: CHEST  2 VIEW  COMPARISON:  10/19/2013  FINDINGS: The heart size and mediastinal contours are within normal limits. Both lungs are clear. The visualized skeletal structures are unremarkable.  IMPRESSION: No active cardiopulmonary disease.   Electronically Signed   By: Lucienne Capers M.D.   On: 06/15/2014 01:25   3:08 AM Lungs clear after neb treatment. Patient feels better.  Wynetta Fines, MD 06/15/14 [phone]

## 2014-08-08 ENCOUNTER — Other Ambulatory Visit: Payer: Self-pay

## 2014-08-08 ENCOUNTER — Emergency Department (HOSPITAL_BASED_OUTPATIENT_CLINIC_OR_DEPARTMENT_OTHER)
Admission: EM | Admit: 2014-08-08 | Discharge: 2014-08-09 | Disposition: A | Discharge status: Home / Self Care | Payer: Medicaid Other | Attending: Emergency Medicine | Admitting: Emergency Medicine

## 2014-08-08 ENCOUNTER — Encounter (HOSPITAL_BASED_OUTPATIENT_CLINIC_OR_DEPARTMENT_OTHER): Payer: Self-pay | Admitting: Emergency Medicine

## 2014-08-08 DIAGNOSIS — G43909 Migraine, unspecified, not intractable, without status migrainosus: Secondary | ICD-10-CM | POA: Diagnosis not present

## 2014-08-08 DIAGNOSIS — E876 Hypokalemia: Secondary | ICD-10-CM | POA: Diagnosis not present

## 2014-08-08 DIAGNOSIS — Z720 Tobacco use: Secondary | ICD-10-CM | POA: Insufficient documentation

## 2014-08-08 DIAGNOSIS — R51 Headache: Secondary | ICD-10-CM

## 2014-08-08 DIAGNOSIS — I10 Essential (primary) hypertension: Secondary | ICD-10-CM | POA: Diagnosis not present

## 2014-08-08 DIAGNOSIS — Z872 Personal history of diseases of the skin and subcutaneous tissue: Secondary | ICD-10-CM | POA: Insufficient documentation

## 2014-08-08 DIAGNOSIS — Z880 Allergy status to penicillin: Secondary | ICD-10-CM | POA: Insufficient documentation

## 2014-08-08 DIAGNOSIS — Z79899 Other long term (current) drug therapy: Secondary | ICD-10-CM | POA: Insufficient documentation

## 2014-08-08 DIAGNOSIS — R519 Headache, unspecified: Secondary | ICD-10-CM

## 2014-08-08 NOTE — ED Notes (Signed)
Pt now reports pressure and ache in left chest

## 2014-08-08 NOTE — ED Notes (Signed)
Pt states she started having a HA and she checked her BP and it was high.  Started feeling nausea and vomited in triage.

## 2014-08-09 LAB — TROPONIN I: Troponin I: <0.03 ng/mL (ref <0.031)

## 2014-08-09 LAB — BASIC METABOLIC PANEL
ANION GAP: 5 (ref 5–15)
BUN: 8 mg/dL (ref 6–23)
CHLORIDE: 109 mmol/L (ref 96–112)
CO2: 28 mmol/L (ref 19–32)
Calcium: 8.2 mg/dL — ABNORMAL LOW (ref 8.4–10.5)
Creatinine, Ser: 0.69 mg/dL (ref 0.50–1.10)
GFR calc Af Amer: >90 mL/min (ref >90)
GFR calc non Af Amer: >90 mL/min (ref >90)
Glucose, Bld: 98 mg/dL (ref 70–99)
Potassium: 3 mmol/L — ABNORMAL LOW (ref 3.5–5.1)
Sodium: 142 mmol/L (ref 135–145)

## 2014-08-09 LAB — CBC WITH DIFFERENTIAL/PLATELET
BASOS ABS: 0 10*3/uL (ref 0.0–0.1)
Basophils Relative: 0 % (ref 0–1)
EOS PCT: 2 % (ref 0–5)
Eosinophils Absolute: 0.1 10*3/uL (ref 0.0–0.7)
HCT: 40.2 % (ref 36.0–46.0)
Hemoglobin: 13.5 g/dL (ref 12.0–15.0)
Lymphocytes Relative: 33 % (ref 12–46)
Lymphs Abs: 2.4 10*3/uL (ref 0.7–4.0)
MCH: 33 pg (ref 26.0–34.0)
MCHC: 33.6 g/dL (ref 30.0–36.0)
MCV: 98.3 fL (ref 78.0–100.0)
Monocytes Absolute: 0.5 10*3/uL (ref 0.1–1.0)
Monocytes Relative: 6 % (ref 3–12)
NEUTROS ABS: 4.3 10*3/uL (ref 1.7–7.7)
Neutrophils Relative %: 59 % (ref 43–77)
PLATELETS: 252 10*3/uL (ref 150–400)
RBC: 4.09 MIL/uL (ref 3.87–5.11)
RDW: 13.3 % (ref 11.5–15.5)
WBC: 7.3 10*3/uL (ref 4.0–10.5)

## 2014-08-09 MED ORDER — DIPHENHYDRAMINE HCL 50 MG/ML IJ SOLN
25.0000 mg | Freq: Once | INTRAMUSCULAR | Status: AC
  Administered 2014-08-09: 25 mg via INTRAVENOUS
  Filled 2014-08-09: qty 1

## 2014-08-09 MED ORDER — METOCLOPRAMIDE HCL 5 MG/ML IJ SOLN
10.0000 mg | Freq: Once | INTRAMUSCULAR | Status: AC
  Administered 2014-08-09: 10 mg via INTRAVENOUS
  Filled 2014-08-09: qty 2

## 2014-08-09 MED ORDER — POTASSIUM CHLORIDE CRYS ER 20 MEQ PO TBCR: 20.0000 meq | EXTENDED_RELEASE_TABLET | Freq: Every day | ORAL | Status: DC

## 2014-08-09 MED ORDER — POTASSIUM CHLORIDE CRYS ER 20 MEQ PO TBCR
40.0000 meq | EXTENDED_RELEASE_TABLET | Freq: Once | ORAL | Status: AC
  Administered 2014-08-09: 40 meq via ORAL
  Filled 2014-08-09: qty 2

## 2014-08-09 MED ORDER — KETOROLAC TROMETHAMINE 15 MG/ML IJ SOLN
15.0000 mg | Freq: Once | INTRAMUSCULAR | Status: AC
  Administered 2014-08-09: 15 mg via INTRAVENOUS
  Filled 2014-08-09: qty 1

## 2014-08-09 MED ORDER — SODIUM CHLORIDE 0.9 % IV BOLUS (SEPSIS)
1000.0000 mL | Freq: Once | INTRAVENOUS | Status: AC
  Administered 2014-08-09: 1000 mL via INTRAVENOUS

## 2014-08-09 NOTE — ED Provider Notes (Signed)
CSN: [id number]     Arrival date & time 08/08/14  2316 History  This chart was scribed for Shanon Rosser, MD by Chester Holstein, ED Scribe. This patient was seen in room MH03/MH03 and the patient's care was started at 12:46 AM.    Chief Complaint  Patient presents with  . Headache   The history is provided by the patient. No language interpreter was used.    HPI HPI Comments: [name] is a 39 y.o. female with PMHx of HTN who presents to the Emergency Department complaining of constant, severe headache with onset the evening before last and worsening yesterday. Pt notes pain is generalized but worse on right side, noting associated eye watering and pain. Pt reports associated nausea, vomiting, mild chest pain and left arm pain. Pt with h/o of headaches but states it has been a while since her last headache. Pt notes her BP was elevated to 217/146 at home this evening; it was 190/106 on arrival and rechecked at 158/99. Pt denies fever, numbness and weakness, abdominal pain and SOB.   Past Medical History  Diagnosis Date  . Sebaceous cyst   . Abscess   . Anemia   . Hypertension   . Headache(784.0)     migraines   Past Surgical History  Procedure Laterality Date  . Partial hysterectomy    . Cholecystectomy open    . Ovarian cyst removal    . R arm surgery    . Gastric bypass  2011    gastric sleeve  . Knee surgery  2005    left  . Cystectomy      2004 axilla bil  . Abdominal hysterectomy    . Medial patellofemoral ligament repair Left 06/08/2013    Procedure: DIAGNOSTIC OPERATIVE ARTHROSCOPY, OPEN MEDIAL PATELLA FEMORAL LIGAMENT RECONSTRUCTION;  Surgeon: Meredith Pel, MD;  Location: [facility];  Service: Orthopedics;  Laterality: Left;  with Hamstring autograft  . Fracture surgery      rt arm   Family History  Problem Relation Age of Onset  . Cancer Maternal Grandfather     lung   History  Substance Use Topics  . Smoking status: Current Every Day Smoker -- 0.50  packs/day for 10 years    Types: Cigarettes  . Smokeless tobacco: Never Used  . Alcohol Use: Yes   OB History    No data available     Review of Systems A complete 10 system review of systems was obtained and all systems are negative except as noted in the HPI and PMH.     Allergies  Penicillins  Home Medications   Prior to Admission medications   Medication Sig Start Date End Date Taking? Authorizing Provider  ALBUTEROL IN Inhale into the lungs.   Yes Historical Provider, MD  Celecoxib (CELEBREX PO) Take 300 mg by mouth.   Yes Historical Provider, MD  acetaminophen (TYLENOL) 500 MG tablet Take 500 mg by mouth every 8 (eight) hours as needed for mild pain or headache.    Historical Provider, MD  amLODipine (NORVASC) 10 MG tablet Take 10 mg by mouth daily.    Historical Provider, MD  cyclobenzaprine (FLEXERIL) 10 MG tablet Take 10 mg by mouth 3 (three) times daily as needed for muscle spasms.    Historical Provider, MD  gabapentin (NEURONTIN) 100 MG capsule Take 100 mg by mouth 2 (two) times daily.    Historical Provider, MD  HYDROcodone-acetaminophen (NORCO/VICODIN) 5-325 MG per tablet Take 2 tablets by mouth every 4 (  four) hours as needed. 04/18/14   Fransico Meadow, PA-C  ibuprofen (ADVIL,MOTRIN) 800 MG tablet Take 1 tablet (800 mg total) by mouth 3 (three) times daily. 04/18/14   Fransico Meadow, PA-C  Menthol-Methyl Salicylate (BEN GAY GREASELESS) 10-15 % greaseless cream Apply topically at bedtime.    Historical Provider, MD  oxyCODONE-acetaminophen (PERCOCET/ROXICET) 5-325 MG per tablet Take 1-2 tablets by mouth every 4 (four) hours as needed for severe pain. 09/29/13   Charlann Lange, PA-C  temazepam (RESTORIL) 15 MG capsule Take 15 mg by mouth at bedtime as needed for sleep.    Historical Provider, MD  topiramate (TOPAMAX) 50 MG tablet Take 50 mg by mouth 2 (two) times daily.    Historical Provider, MD   BP 165/99 mmHg  Pulse 73  Temp(Src) 98.5 F (36.9 C) (Oral)  Resp 18   Ht 6' (1.829 m)  Wt 215 lb (97.523 kg)  BMI 29.15 kg/m2  SpO2 95%   Physical Exam General: Well-developed, well-nourished female in no acute distress; appearance consistent with age of record HENT: normocephalic; atraumatic Eyes: pupils equal, round and reactive to light; extraocular muscles intact Neck: supple Heart: regular rate and rhythm; no murmurs, rubs or gallops Lungs: clear to auscultation bilaterally Abdomen: soft; nondistended; nontender; bowel sounds present Extremities: No deformity; full range of motion; pulses normal Neurologic: Awake, alert and oriented; motor function intact in all extremities and symmetric; no facial droop; negative Romberg; normal finger to nose Skin: Warm and dry Psychiatric: Normal mood and affect   ED Course  Procedures (including critical care time) DIAGNOSTIC STUDIES: Oxygen Saturation is 97% on room air, normal by my interpretation.    COORDINATION OF CARE: 12:51 AM Discussed treatment plan with patient at beside, the patient agrees with the plan and has no further questions at this time.     MDM  Nursing notes and vitals signs, including pulse oximetry, reviewed.  Summary of this visit's results, reviewed by myself:  EKG Interpretation:  Date & Time: 08/08/2014 11:51 PM  Rate: 74  Rhythm: normal sinus rhythm  QRS Axis: normal  Intervals: normal  ST/T Wave abnormalities: normal  Conduction Disutrbances:none  Narrative Interpretation:   Old EKG Reviewed: none available  Labs:  Results for orders placed or performed during the hospital encounter of 08/08/14 (from the past 24 hour(s))  Basic metabolic panel     Status: Abnormal   Collection Time: 08/09/14  1:00 AM  Result Value Ref Range   Sodium 142 135 - 145 mmol/L   Potassium 3.0 (L) 3.5 - 5.1 mmol/L   Chloride 109 96 - 112 mmol/L   CO2 28 19 - 32 mmol/L   Glucose, Bld 98 70 - 99 mg/dL   BUN 8 6 - 23 mg/dL   Creatinine, Ser 0.69 0.50 - 1.10 mg/dL   Calcium 8.2 (L) 8.4  - 10.5 mg/dL   GFR calc non Af Amer >90 >90 mL/min   GFR calc Af Amer >90 >90 mL/min   Anion gap 5 5 - 15  CBC with Differential/Platelet     Status: None   Collection Time: 08/09/14  1:00 AM  Result Value Ref Range   WBC 7.3 4.0 - 10.5 K/uL   RBC 4.09 3.87 - 5.11 MIL/uL   Hemoglobin 13.5 12.0 - 15.0 g/dL   HCT 40.2 36.0 - 46.0 %   MCV 98.3 78.0 - 100.0 fL   MCH 33.0 26.0 - 34.0 pg   MCHC 33.6 30.0 - 36.0 g/dL   RDW  13.3 11.5 - 15.5 %   Platelets 252 150 - 400 K/uL   Neutrophils Relative % 59 43 - 77 %   Neutro Abs 4.3 1.7 - 7.7 K/uL   Lymphocytes Relative 33 12 - 46 %   Lymphs Abs 2.4 0.7 - 4.0 K/uL   Monocytes Relative 6 3 - 12 %   Monocytes Absolute 0.5 0.1 - 1.0 K/uL   Eosinophils Relative 2 0 - 5 %   Eosinophils Absolute 0.1 0.0 - 0.7 K/uL   Basophils Relative 0 0 - 1 %   Basophils Absolute 0.0 0.0 - 0.1 K/uL  Troponin I     Status: None   Collection Time: 08/09/14  1:00 AM  Result Value Ref Range   Troponin I <0.03 <0.031 ng/mL   2:07 AM Headache resolved after IV medications and fluid bolus. Patient denies chest pain or shortness of breath. There is no evidence of end organ damage on laboratory studies, acute antihypertensive intervention is not indicated.  I personally performed the services described in this documentation, which was scribed in my presence. The recorded information has been reviewed and is accurate.   Shanon Rosser, MD 08/09/14 [phone]

## 2014-08-09 NOTE — ED Notes (Signed)
MD at bedside. 

## 2014-10-01 ENCOUNTER — Encounter (HOSPITAL_BASED_OUTPATIENT_CLINIC_OR_DEPARTMENT_OTHER): Payer: Self-pay

## 2014-10-01 ENCOUNTER — Emergency Department (HOSPITAL_BASED_OUTPATIENT_CLINIC_OR_DEPARTMENT_OTHER)
Admission: EM | Admit: 2014-10-01 | Discharge: 2014-10-01 | Disposition: A | Discharge status: Home / Self Care | Payer: Medicaid Other | Attending: Emergency Medicine | Admitting: Emergency Medicine

## 2014-10-01 ENCOUNTER — Emergency Department (HOSPITAL_BASED_OUTPATIENT_CLINIC_OR_DEPARTMENT_OTHER): Payer: Medicaid Other

## 2014-10-01 DIAGNOSIS — Y998 Other external cause status: Secondary | ICD-10-CM | POA: Diagnosis not present

## 2014-10-01 DIAGNOSIS — Z872 Personal history of diseases of the skin and subcutaneous tissue: Secondary | ICD-10-CM | POA: Diagnosis not present

## 2014-10-01 DIAGNOSIS — W108XXA Fall (on) (from) other stairs and steps, initial encounter: Secondary | ICD-10-CM | POA: Insufficient documentation

## 2014-10-01 DIAGNOSIS — Y9289 Other specified places as the place of occurrence of the external cause: Secondary | ICD-10-CM | POA: Diagnosis not present

## 2014-10-01 DIAGNOSIS — S59911A Unspecified injury of right forearm, initial encounter: Secondary | ICD-10-CM | POA: Diagnosis present

## 2014-10-01 DIAGNOSIS — Z862 Personal history of diseases of the blood and blood-forming organs and certain disorders involving the immune mechanism: Secondary | ICD-10-CM | POA: Insufficient documentation

## 2014-10-01 DIAGNOSIS — Z79899 Other long term (current) drug therapy: Secondary | ICD-10-CM | POA: Insufficient documentation

## 2014-10-01 DIAGNOSIS — S63501A Unspecified sprain of right wrist, initial encounter: Secondary | ICD-10-CM | POA: Diagnosis not present

## 2014-10-01 DIAGNOSIS — Y9389 Activity, other specified: Secondary | ICD-10-CM | POA: Insufficient documentation

## 2014-10-01 DIAGNOSIS — I10 Essential (primary) hypertension: Secondary | ICD-10-CM | POA: Diagnosis not present

## 2014-10-01 DIAGNOSIS — Z880 Allergy status to penicillin: Secondary | ICD-10-CM | POA: Insufficient documentation

## 2014-10-01 DIAGNOSIS — Z720 Tobacco use: Secondary | ICD-10-CM | POA: Insufficient documentation

## 2014-10-01 NOTE — ED Provider Notes (Signed)
CSN: [id number]     Arrival date & time 10/01/14  1742 History  This chart was scribed for Wandra Arthurs, MD by Chester Holstein, ED Scribe. This patient was seen in room MH07/MH07 and the patient's care was started at 5:55 PM.      Chief Complaint  Patient presents with  . Arm Injury     The history is provided by the patient. No language interpreter was used.   HPI Comments: [name] is a 39 y.o. female with PMHx of anemia and HTN who presents to the Emergency Department complaining of pain to right wrist with onset at 8 PM last night. Pt was standing at the top of a 3 step step-ladder at onset, reaching too far which caused her to lose her balance and fall. She states she fell onto her right side and attempted to catch herself with her right arm. She states she hit her head but denies a headache currently. She notes some tenderness to the area to touch.  She states pain in wrist radiates up forearm with onset this morning. Pt took Tylenol and Advil for relief. Pt with h/o of surgery and screw placement in right forearm.  She denies blood thinner use. Pt denies LOC, neck pain, and swelling.    Past Medical History  Diagnosis Date  . Sebaceous cyst   . Abscess   . Anemia   . Hypertension   . Headache(784.0)     migraines   Past Surgical History  Procedure Laterality Date  . Partial hysterectomy    . Cholecystectomy open    . Ovarian cyst removal    . R arm surgery    . Gastric bypass  2011    gastric sleeve  . Knee surgery  2005    left  . Cystectomy      2004 axilla bil  . Abdominal hysterectomy    . Medial patellofemoral ligament repair Left 06/08/2013    Procedure: DIAGNOSTIC OPERATIVE ARTHROSCOPY, OPEN MEDIAL PATELLA FEMORAL LIGAMENT RECONSTRUCTION;  Surgeon: Meredith Pel, MD;  Location: [facility];  Service: Orthopedics;  Laterality: Left;  with Hamstring autograft  . Fracture surgery      rt arm   Family History  Problem Relation Age of Onset  . Cancer  Maternal Grandfather     lung   History  Substance Use Topics  . Smoking status: Current Every Day Smoker -- 0.50 packs/day for 10 years    Types: Cigarettes  . Smokeless tobacco: Never Used  . Alcohol Use: Yes   OB History    No data available     Review of Systems  Musculoskeletal: Positive for myalgias and arthralgias. Negative for joint swelling and neck pain.  Neurological: Negative for syncope and headaches.  Hematological: Does not bruise/bleed easily.  All other systems reviewed and are negative.     Allergies  Penicillins  Home Medications   Prior to Admission medications   Medication Sig Start Date End Date Taking? Authorizing Provider  acetaminophen (TYLENOL) 500 MG tablet Take 500 mg by mouth every 8 (eight) hours as needed for mild pain or headache.    Historical Provider, MD  ALBUTEROL IN Inhale into the lungs.    Historical Provider, MD  amLODipine (NORVASC) 10 MG tablet Take 10 mg by mouth daily.    Historical Provider, MD  Celecoxib (CELEBREX PO) Take 300 mg by mouth.    Historical Provider, MD  cyclobenzaprine (FLEXERIL) 10 MG tablet Take 10 mg by mouth  3 (three) times daily as needed for muscle spasms.    Historical Provider, MD  gabapentin (NEURONTIN) 100 MG capsule Take 100 mg by mouth 2 (two) times daily.    Historical Provider, MD  Menthol-Methyl Salicylate (BEN GAY GREASELESS) 10-15 % greaseless cream Apply topically at bedtime.    Historical Provider, MD  temazepam (RESTORIL) 15 MG capsule Take 15 mg by mouth at bedtime as needed for sleep.    Historical Provider, MD  topiramate (TOPAMAX) 50 MG tablet Take 50 mg by mouth 2 (two) times daily.    Historical Provider, MD   BP 155/96 mmHg  Pulse 84  Temp(Src) 98.7 F (37.1 C) (Oral)  Resp 18  Ht 6' (1.829 m)  Wt 225 lb (102.059 kg)  BMI 30.51 kg/m2  SpO2 97% Physical Exam  Constitutional: She is oriented to person, place, and time. She appears well-developed and well-nourished.  HENT:  Head:  Normocephalic.  No hematoma noted  Eyes: Conjunctivae are normal.  Neck: Normal range of motion. Neck supple.  Cardiovascular:  Pulses:      Radial pulses are 2+ on the right side.  Pulmonary/Chest: Effort normal.  Musculoskeletal: Normal range of motion.       Right wrist: She exhibits tenderness. She exhibits normal range of motion.       Cervical back: She exhibits no tenderness.  Previous well-healing surgical scar to right forearm.  Neurological: She is alert and oriented to person, place, and time. No sensory deficit.  Normal sensation  Skin: Skin is warm and dry.  Psychiatric: She has a normal mood and affect. Her behavior is normal.  Nursing note and vitals reviewed.   ED Course  Procedures (including critical care time) DIAGNOSTIC STUDIES: Oxygen Saturation is 97% on room air, adequate by my interpretation.    COORDINATION OF CARE: 5:58 PM Discussed treatment plan with patient at beside, the patient agrees with the plan and has no further questions at this time.   Labs Review Labs Reviewed - No data to display  Imaging Review Dg Forearm Right  10/01/2014   CLINICAL DATA:  Status post injury to the right forearm. Initial encounter.  EXAM: RIGHT FOREARM - 2 VIEW  COMPARISON:  Right forearm radiographs performed 07/26/2003  FINDINGS: There is no evidence of acute fracture or dislocation. The radius and ulna appear intact. Residual lucency is noted at the site of the patient's prior internal fixation of the fracture through the mid to distal diaphysis of the radius. The hardware appears intact. Negative ulnar variance is noted.  The elbow joint is unremarkable in appearance. No elbow joint effusion is identified. The carpal rows appear grossly intact, and demonstrate normal alignment. No definite soft tissue abnormalities are characterized on radiograph.  IMPRESSION: No evidence of acute fracture or dislocation. Residual lucency at the site of the prior fracture through the mid  to distal diaphysis of the radius; associated hardware appears intact.   Electronically Signed   By: Garald Balding M.D.   On: 10/01/2014 18:38   Dg Wrist Complete Right  10/01/2014   CLINICAL DATA:  Fall from small ladder with right wrist and forearm pain.  EXAM: RIGHT WRIST - COMPLETE 3+ VIEW  COMPARISON:  None.  FINDINGS: There is no evidence of fracture or dislocation. There is no evidence of arthropathy or other focal bone abnormality. Soft tissues are unremarkable.  IMPRESSION: Negative.   Electronically Signed   By: Marin Olp M.D.   On: 10/01/2014 18:35     EKG  Interpretation None      MDM   Final diagnoses:  None   [name] is a 39 y.o. female here with R forearm and wrist pain s/p injury. Likely sprain but will get xray.  6:52 PM Xray showed no acute fracture. Hardware intact. Likely sprain. Given wrist splint for comfort. Will dc home.    I personally performed the services described in this documentation, which was scribed in my presence. The recorded information has been reviewed and is accurate.    Wandra Arthurs, MD 10/01/14 [phone]

## 2014-10-01 NOTE — ED Notes (Signed)
Patient here with right arm pain after falling from small ladder

## 2014-10-01 NOTE — Discharge Instructions (Signed)
Take tylenol, motrin for pain.   Use splint for comfort.   Follow up with your doctor.   No heavy lifting for a week.   Return to ER if you have severe pain, not able to feel your fingers.

## 2014-11-25 ENCOUNTER — Encounter (HOSPITAL_BASED_OUTPATIENT_CLINIC_OR_DEPARTMENT_OTHER): Payer: Self-pay | Admitting: *Deleted

## 2014-11-25 ENCOUNTER — Emergency Department (HOSPITAL_BASED_OUTPATIENT_CLINIC_OR_DEPARTMENT_OTHER)
Admission: EM | Admit: 2014-11-25 | Discharge: 2014-11-25 | Disposition: A | Discharge status: Home / Self Care | Payer: Medicaid Other | Attending: Emergency Medicine | Admitting: Emergency Medicine

## 2014-11-25 ENCOUNTER — Emergency Department (HOSPITAL_BASED_OUTPATIENT_CLINIC_OR_DEPARTMENT_OTHER): Payer: Medicaid Other

## 2014-11-25 DIAGNOSIS — Z79899 Other long term (current) drug therapy: Secondary | ICD-10-CM | POA: Diagnosis not present

## 2014-11-25 DIAGNOSIS — I10 Essential (primary) hypertension: Secondary | ICD-10-CM | POA: Diagnosis not present

## 2014-11-25 DIAGNOSIS — Z872 Personal history of diseases of the skin and subcutaneous tissue: Secondary | ICD-10-CM | POA: Diagnosis not present

## 2014-11-25 DIAGNOSIS — G43909 Migraine, unspecified, not intractable, without status migrainosus: Secondary | ICD-10-CM | POA: Insufficient documentation

## 2014-11-25 DIAGNOSIS — R1031 Right lower quadrant pain: Secondary | ICD-10-CM | POA: Insufficient documentation

## 2014-11-25 DIAGNOSIS — Z8739 Personal history of other diseases of the musculoskeletal system and connective tissue: Secondary | ICD-10-CM | POA: Diagnosis not present

## 2014-11-25 DIAGNOSIS — Z862 Personal history of diseases of the blood and blood-forming organs and certain disorders involving the immune mechanism: Secondary | ICD-10-CM | POA: Insufficient documentation

## 2014-11-25 DIAGNOSIS — N390 Urinary tract infection, site not specified: Secondary | ICD-10-CM | POA: Insufficient documentation

## 2014-11-25 DIAGNOSIS — Z880 Allergy status to penicillin: Secondary | ICD-10-CM | POA: Insufficient documentation

## 2014-11-25 DIAGNOSIS — E669 Obesity, unspecified: Secondary | ICD-10-CM | POA: Diagnosis not present

## 2014-11-25 DIAGNOSIS — R1033 Periumbilical pain: Secondary | ICD-10-CM | POA: Insufficient documentation

## 2014-11-25 DIAGNOSIS — R109 Unspecified abdominal pain: Secondary | ICD-10-CM

## 2014-11-25 LAB — CBC WITH DIFFERENTIAL/PLATELET
Basophils Absolute: 0 10*3/uL (ref 0.0–0.1)
Basophils Relative: 0 % (ref 0–1)
EOS ABS: 0.1 10*3/uL (ref 0.0–0.7)
Eosinophils Relative: 2 % (ref 0–5)
HCT: 44.1 % (ref 36.0–46.0)
HEMOGLOBIN: 14.8 g/dL (ref 12.0–15.0)
LYMPHS ABS: 2.5 10*3/uL (ref 0.7–4.0)
Lymphocytes Relative: 39 % (ref 12–46)
MCH: 31.9 pg (ref 26.0–34.0)
MCHC: 33.6 g/dL (ref 30.0–36.0)
MCV: 95 fL (ref 78.0–100.0)
MONOS PCT: 6 % (ref 3–12)
Monocytes Absolute: 0.4 10*3/uL (ref 0.1–1.0)
Neutro Abs: 3.4 10*3/uL (ref 1.7–7.7)
Neutrophils Relative %: 53 % (ref 43–77)
PLATELETS: 285 10*3/uL (ref 150–400)
RBC: 4.64 MIL/uL (ref 3.87–5.11)
RDW: 12.7 % (ref 11.5–15.5)
WBC: 6.4 10*3/uL (ref 4.0–10.5)

## 2014-11-25 LAB — URINALYSIS, ROUTINE W REFLEX MICROSCOPIC
Glucose, UA: NEGATIVE mg/dL
Hgb urine dipstick: NEGATIVE
Ketones, ur: 15 mg/dL — AB
Nitrite: NEGATIVE
PROTEIN: NEGATIVE mg/dL
Specific Gravity, Urine: 1.03 (ref 1.005–1.030)
Urobilinogen, UA: 1 mg/dL (ref 0.0–1.0)
pH: 5.5 (ref 5.0–8.0)

## 2014-11-25 LAB — COMPREHENSIVE METABOLIC PANEL
ALBUMIN: 3.4 g/dL — AB (ref 3.5–5.0)
ALT: 14 U/L (ref 14–54)
AST: 19 U/L (ref 15–41)
Alkaline Phosphatase: 68 U/L (ref 38–126)
Anion gap: 7 (ref 5–15)
BUN: 11 mg/dL (ref 6–20)
CALCIUM: 8.6 mg/dL — AB (ref 8.9–10.3)
CO2: 28 mmol/L (ref 22–32)
Chloride: 106 mmol/L (ref 101–111)
Creatinine, Ser: 0.8 mg/dL (ref 0.44–1.00)
GFR calc Af Amer: >60 mL/min (ref >60)
GFR calc non Af Amer: >60 mL/min (ref >60)
Glucose, Bld: 89 mg/dL (ref 65–99)
Potassium: 3.5 mmol/L (ref 3.5–5.1)
SODIUM: 141 mmol/L (ref 135–145)
TOTAL PROTEIN: 6.7 g/dL (ref 6.5–8.1)
Total Bilirubin: 0.5 mg/dL (ref 0.3–1.2)

## 2014-11-25 LAB — URINE MICROSCOPIC-ADD ON

## 2014-11-25 LAB — TROPONIN I: Troponin I: <0.03 ng/mL (ref <0.031)

## 2014-11-25 LAB — LIPASE, BLOOD: LIPASE: 68 U/L — AB (ref 22–51)

## 2014-11-25 MED ORDER — MORPHINE SULFATE 4 MG/ML IJ SOLN
4.0000 mg | Freq: Once | INTRAMUSCULAR | Status: AC
  Administered 2014-11-25: 4 mg via INTRAVENOUS
  Filled 2014-11-25: qty 1

## 2014-11-25 MED ORDER — ONDANSETRON HCL 4 MG/2ML IJ SOLN
4.0000 mg | Freq: Once | INTRAMUSCULAR | Status: AC
  Administered 2014-11-25: 4 mg via INTRAVENOUS
  Filled 2014-11-25: qty 2

## 2014-11-25 MED ORDER — SODIUM CHLORIDE 0.9 % IV BOLUS (SEPSIS)
1000.0000 mL | Freq: Once | INTRAVENOUS | Status: AC
  Administered 2014-11-25: 1000 mL via INTRAVENOUS

## 2014-11-25 MED ORDER — OXYCODONE-ACETAMINOPHEN 5-325 MG PO TABS: 1.0000 | ORAL_TABLET | ORAL | Status: DC | PRN

## 2014-11-25 MED ORDER — IOHEXOL 300 MG/ML  SOLN
25.0000 mL | Freq: Once | INTRAMUSCULAR | Status: AC | PRN
  Administered 2014-11-25: 25 mL via ORAL

## 2014-11-25 MED ORDER — SULFAMETHOXAZOLE-TRIMETHOPRIM 800-160 MG PO TABS: 1.0000 | ORAL_TABLET | Freq: Two times a day (BID) | ORAL | Status: AC

## 2014-11-25 MED ORDER — IOHEXOL 300 MG/ML  SOLN
100.0000 mL | Freq: Once | INTRAMUSCULAR | Status: AC | PRN
  Administered 2014-11-25: 100 mL via INTRAVENOUS

## 2014-11-25 MED ORDER — ONDANSETRON 4 MG PO TBDP: 4.0000 mg | ORAL_TABLET | Freq: Three times a day (TID) | ORAL | Status: DC | PRN

## 2014-11-25 NOTE — ED Notes (Signed)
Pt c/o diffuse abd pain x 1 day pt states increased pain with po intake sent here from [facility]D office for eval

## 2014-11-25 NOTE — ED Provider Notes (Signed)
CSN: [id number]     Arrival date & time 11/25/14  1312 History   First MD Initiated Contact with Patient 11/25/14 1357     Chief Complaint  Patient presents with  . Abdominal Pain     (Consider location/radiation/quality/duration/timing/severity/associated sxs/prior Treatment) The history is provided by the patient and medical records.    This is a 39 year old female with past medical history significant for anemia, hypertension, migraine headaches, presenting to the ED for abdominal pain. Patient states for the past 24 hours she has had periumbilical abdominal pain with some radiation to the right side of her abdomen and into her lower back. She states pain is constant, aching with intermittent sharp, stabbing sensations. States pain is exacerbated with attempting to eat. She denies any nausea, vomiting, or diarrhea. No chest pain or SOB.  Prior abdominal surgeries include hysterectomy, gastric sleeve, ovarian cyst removal, cholecystectomy. She states she had no complications from any prior abdominal surgeries. No fever or chills. No urinary symptoms or vaginal complaints. Patient is not currently sexually active. Patient was seen by PCP earlier today and sent here for further evaluation.  VSS.  Past Medical History  Diagnosis Date  . Sebaceous cyst   . Abscess   . Anemia   . Hypertension   . Headache(784.0)     migraines   Past Surgical History  Procedure Laterality Date  . Partial hysterectomy    . Cholecystectomy open    . Ovarian cyst removal    . R arm surgery    . Gastric bypass  2011    gastric sleeve  . Knee surgery  2005    left  . Cystectomy      2004 axilla bil  . Abdominal hysterectomy    . Medial patellofemoral ligament repair Left 06/08/2013    Procedure: DIAGNOSTIC OPERATIVE ARTHROSCOPY, OPEN MEDIAL PATELLA FEMORAL LIGAMENT RECONSTRUCTION;  Surgeon: Meredith Pel, MD;  Location: [facility];  Service: Orthopedics;  Laterality: Left;  with Hamstring autograft  .  Fracture surgery      rt arm   Family History  Problem Relation Age of Onset  . Cancer Maternal Grandfather     lung   History  Substance Use Topics  . Smoking status: Current Every Day Smoker -- 0.50 packs/day for 10 years    Types: Cigarettes  . Smokeless tobacco: Never Used  . Alcohol Use: Yes   OB History    No data available     Review of Systems  Gastrointestinal: Positive for abdominal pain.  All other systems reviewed and are negative.     Allergies  Penicillins  Home Medications   Prior to Admission medications   Medication Sig Start Date End Date Taking? Authorizing Provider  acetaminophen (TYLENOL) 500 MG tablet Take 500 mg by mouth every 8 (eight) hours as needed for mild pain or headache.    Historical Provider, MD  ALBUTEROL IN Inhale into the lungs.    Historical Provider, MD  amLODipine (NORVASC) 10 MG tablet Take 10 mg by mouth daily.    Historical Provider, MD  Celecoxib (CELEBREX PO) Take 300 mg by mouth.    Historical Provider, MD  cyclobenzaprine (FLEXERIL) 10 MG tablet Take 10 mg by mouth 3 (three) times daily as needed for muscle spasms.    Historical Provider, MD  gabapentin (NEURONTIN) 100 MG capsule Take 100 mg by mouth 2 (two) times daily.    Historical Provider, MD  Menthol-Methyl Salicylate (BEN GAY GREASELESS) 10-15 % greaseless cream Apply topically  at bedtime.    Historical Provider, MD  temazepam (RESTORIL) 15 MG capsule Take 15 mg by mouth at bedtime as needed for sleep.    Historical Provider, MD  topiramate (TOPAMAX) 50 MG tablet Take 50 mg by mouth 2 (two) times daily.    Historical Provider, MD   BP 159/97 mmHg  Pulse 79  Temp(Src) 98.5 F (36.9 C) (Oral)  Resp 16  Ht 6' (1.829 m)  Wt 219 lb (99.338 kg)  BMI 29.70 kg/m2  SpO2 97%   Physical Exam  Constitutional: She is oriented to person, place, and time. She appears well-developed and well-nourished.  Appears very comfortable, tearful  HENT:  Head: Normocephalic and  atraumatic.  Mouth/Throat: Oropharynx is clear and moist.  Eyes: Conjunctivae and EOM are normal. Pupils are equal, round, and reactive to light.  Neck: Normal range of motion.  Cardiovascular: Normal rate, regular rhythm and normal heart sounds.   Pulmonary/Chest: Effort normal and breath sounds normal.  Abdominal: Soft. Bowel sounds are normal. There is tenderness in the right lower quadrant and periumbilical area. There is no CVA tenderness.  Musculoskeletal: Normal range of motion.  Neurological: She is alert and oriented to person, place, and time.  Skin: Skin is warm and dry.  Psychiatric: She has a normal mood and affect.  Nursing note and vitals reviewed.   ED Course  Procedures (including critical care time) Labs Review Labs Reviewed  COMPREHENSIVE METABOLIC PANEL - Abnormal; Notable for the following:    Calcium 8.6 (*)    Albumin 3.4 (*)    All other components within normal limits  LIPASE, BLOOD - Abnormal; Notable for the following:    Lipase 68 (*)    All other components within normal limits  URINALYSIS, ROUTINE W REFLEX MICROSCOPIC (NOT AT [facility]) - Abnormal; Notable for the following:    Color, Urine AMBER (*)    APPearance CLOUDY (*)    Bilirubin Urine SMALL (*)    Ketones, ur 15 (*)    Leukocytes, UA TRACE (*)    All other components within normal limits  URINE MICROSCOPIC-ADD ON - Abnormal; Notable for the following:    Squamous Epithelial / LPF FEW (*)    Bacteria, UA MANY (*)    Casts HYALINE CASTS (*)    All other components within normal limits  CBC WITH DIFFERENTIAL/PLATELET  TROPONIN I    Imaging Review Ct Abdomen Pelvis W Contrast  11/25/2014   CLINICAL DATA:  Diffuse abdominal pain for 1 day  EXAM: CT ABDOMEN AND PELVIS WITH CONTRAST  TECHNIQUE: Multidetector CT imaging of the abdomen and pelvis was performed using the standard protocol following bolus administration of intravenous contrast.  CONTRAST:  62mL OMNIPAQUE IOHEXOL 300 MG/ML SOLN, 147mL  OMNIPAQUE IOHEXOL 300 MG/ML SOLN  COMPARISON:  12/04/2011  FINDINGS: Lung bases are unremarkable. Sagittal images of the spine shows mild degenerative changes lumbar spine. Status postcholecystectomy. Enhanced liver is stable. Tiny subcapsular hyperdense liver lesion anterior aspect of right hepatic lobe measures 6 mm stable in size in appearance from prior exam. CBD measures 8 mm in diameter. The pancreas, spleen and adrenal glands are unremarkable. Abdominal aorta is unremarkable.  No small bowel obstruction.  No ascites or free air.  No adenopathy.  Again noted status post gastric bypass surgery. There is no pericecal inflammation. Normal appendix. There is small fecal like material within terminal ileum probable incompetent ileocecal valve. No thickened or dilated small bowel loops are noted. Moderate gaseous distension of sigmoid colon. Some  gas noted within rectum. There is no colitis or diverticulitis. Small pelvic free fluid noted within posterior cul-de-sac. The patient is status post hysterectomy. The urinary bladder is under distended.  Kidneys are symmetrical in size and enhancement. No hydronephrosis or hydroureter.  Mild hepatic fatty infiltration.  IMPRESSION: 1. There is mild hepatic fatty infiltration. 2. No acute inflammatory process within abdomen or pelvis. 3. No pericecal inflammation. Normal appendix. 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 4. Small amount of free fluid noted within posterior pelvis. Status post hysterectomy. 5. Moderate gas noted sigmoid colon.  No colitis or diverticulitis. 6. Status post appendectomy.  Status post gastric bypass surgery.   Electronically Signed   By: Lahoma Crocker M.D.   On: 11/25/2014 15:05     EKG Interpretation None      MDM   Final diagnoses:  Abdominal pain  UTI (lower urinary tract infection)   39 y.o. M here with 24 hours of abdominal pain.  Pain exacerbated with eating.  No N/V/D.  Patient afebrile, non-toxic but she does appear  uncomfortable.  She has tenderness in her periumbilical and right lower quadrant. She does not have tenderness at McBurney's point. Wide differential for etiology of abdominal pain given her history and current symptoms. Lab work pending. Will obtain CT scan for further evaluation. Patient was treated with IV fluids, morphine, and Zofran.  Labwork notable for mildly elevated lipase at 68.  U/a appears infectious.  CT scan without acute findings, gastric sleeve appears stable.  After medications patient is feeling better.  Discussed results with patient, she acknowledged understanding.  Possibly mild pancreatitis and UTI contributing to her symptoms.  Patient is status post cholecystectomy, denies recent alcohol use. Will discharge home with supportive care including Percocet, Zofran.  Short course bactrim for UTI.  Encouraged gentle diet for the next 24-48 hours. Patient to FU with PCP.  Discussed plan with patient, he/she acknowledged understanding and agreed with plan of care.  Return precautions given for new or worsening symptoms.  Larene Pickett, PA-C 11/25/14 [place], MD 11/26/14 [phone]

## 2014-11-25 NOTE — Discharge Instructions (Signed)
Take the prescribed medication as directed.  Recommend gentle diet for the next 24-48 hours until symptoms begin improving. Follow-up with your primary care physician. Return to the ED for new or worsening symptoms.

## 2014-11-27 ENCOUNTER — Encounter (HOSPITAL_BASED_OUTPATIENT_CLINIC_OR_DEPARTMENT_OTHER): Payer: Self-pay | Admitting: Emergency Medicine

## 2014-11-27 ENCOUNTER — Inpatient Hospital Stay (HOSPITAL_BASED_OUTPATIENT_CLINIC_OR_DEPARTMENT_OTHER)
Admission: EM | Admit: 2014-11-27 | Discharge: 2014-11-30 | DRG: 392 | Disposition: A | Discharge status: Home / Self Care | Payer: Medicaid Other | Attending: Internal Medicine | Admitting: Internal Medicine

## 2014-11-27 DIAGNOSIS — K2900 Acute gastritis without bleeding: Principal | ICD-10-CM | POA: Diagnosis present

## 2014-11-27 DIAGNOSIS — F17210 Nicotine dependence, cigarettes, uncomplicated: Secondary | ICD-10-CM | POA: Diagnosis present

## 2014-11-27 DIAGNOSIS — R1013 Epigastric pain: Secondary | ICD-10-CM | POA: Insufficient documentation

## 2014-11-27 DIAGNOSIS — E669 Obesity, unspecified: Secondary | ICD-10-CM | POA: Diagnosis present

## 2014-11-27 DIAGNOSIS — Z9884 Bariatric surgery status: Secondary | ICD-10-CM

## 2014-11-27 DIAGNOSIS — R109 Unspecified abdominal pain: Secondary | ICD-10-CM | POA: Diagnosis present

## 2014-11-27 DIAGNOSIS — M791 Myalgia: Secondary | ICD-10-CM | POA: Diagnosis present

## 2014-11-27 DIAGNOSIS — Z6831 Body mass index (BMI) 31.0-31.9, adult: Secondary | ICD-10-CM

## 2014-11-27 DIAGNOSIS — E876 Hypokalemia: Secondary | ICD-10-CM

## 2014-11-27 DIAGNOSIS — R112 Nausea with vomiting, unspecified: Secondary | ICD-10-CM

## 2014-11-27 DIAGNOSIS — R52 Pain, unspecified: Secondary | ICD-10-CM

## 2014-11-27 DIAGNOSIS — I10 Essential (primary) hypertension: Secondary | ICD-10-CM

## 2014-11-27 HISTORY — DX: Chondromalacia, unspecified site: M94.20

## 2014-11-27 HISTORY — DX: Obesity, unspecified: E66.9

## 2014-11-27 HISTORY — DX: Unspecified condition associated with female genital organs and menstrual cycle: N94.9

## 2014-11-27 HISTORY — DX: Hidradenitis suppurativa: L73.2

## 2014-11-27 HISTORY — DX: Obesity, class 1: E66.811

## 2014-11-27 HISTORY — DX: Nausea with vomiting, unspecified: R11.2

## 2014-11-27 HISTORY — DX: Pityriasis rosea: L42

## 2014-11-27 NOTE — ED Notes (Signed)
Pt in c/o unrelieved abdominal pain and nausea since her visit here x 3 days ago. Pt states now she is having body aches, leg cramps, chills, and diaphoresis. Pt is hypertensive in traige, afebrile.

## 2014-11-28 ENCOUNTER — Encounter (HOSPITAL_COMMUNITY): Payer: Self-pay | Admitting: General Practice

## 2014-11-28 ENCOUNTER — Inpatient Hospital Stay (HOSPITAL_COMMUNITY): Payer: Medicaid Other

## 2014-11-28 ENCOUNTER — Emergency Department (HOSPITAL_BASED_OUTPATIENT_CLINIC_OR_DEPARTMENT_OTHER): Payer: Medicaid Other

## 2014-11-28 DIAGNOSIS — R112 Nausea with vomiting, unspecified: Secondary | ICD-10-CM | POA: Diagnosis not present

## 2014-11-28 DIAGNOSIS — R1013 Epigastric pain: Secondary | ICD-10-CM | POA: Diagnosis not present

## 2014-11-28 DIAGNOSIS — M791 Myalgia: Secondary | ICD-10-CM | POA: Diagnosis present

## 2014-11-28 DIAGNOSIS — Z6831 Body mass index (BMI) 31.0-31.9, adult: Secondary | ICD-10-CM | POA: Diagnosis not present

## 2014-11-28 DIAGNOSIS — E669 Obesity, unspecified: Secondary | ICD-10-CM | POA: Diagnosis present

## 2014-11-28 DIAGNOSIS — Z9884 Bariatric surgery status: Secondary | ICD-10-CM | POA: Diagnosis not present

## 2014-11-28 DIAGNOSIS — R109 Unspecified abdominal pain: Secondary | ICD-10-CM | POA: Diagnosis not present

## 2014-11-28 DIAGNOSIS — E876 Hypokalemia: Secondary | ICD-10-CM | POA: Diagnosis present

## 2014-11-28 DIAGNOSIS — F17210 Nicotine dependence, cigarettes, uncomplicated: Secondary | ICD-10-CM | POA: Diagnosis present

## 2014-11-28 DIAGNOSIS — K2900 Acute gastritis without bleeding: Secondary | ICD-10-CM | POA: Diagnosis present

## 2014-11-28 DIAGNOSIS — I10 Essential (primary) hypertension: Secondary | ICD-10-CM | POA: Diagnosis present

## 2014-11-28 DIAGNOSIS — R1011 Right upper quadrant pain: Secondary | ICD-10-CM | POA: Diagnosis present

## 2014-11-28 DIAGNOSIS — R748 Abnormal levels of other serum enzymes: Secondary | ICD-10-CM

## 2014-11-28 HISTORY — DX: Nausea with vomiting, unspecified: R11.2

## 2014-11-28 LAB — URINALYSIS, ROUTINE W REFLEX MICROSCOPIC
Glucose, UA: NEGATIVE mg/dL
HGB URINE DIPSTICK: NEGATIVE
KETONES UR: NEGATIVE mg/dL
Leukocytes, UA: NEGATIVE
Nitrite: NEGATIVE
PROTEIN: NEGATIVE mg/dL
Specific Gravity, Urine: 1.028 (ref 1.005–1.030)
Urobilinogen, UA: 1 mg/dL (ref 0.0–1.0)
pH: 5.5 (ref 5.0–8.0)

## 2014-11-28 LAB — CBC WITH DIFFERENTIAL/PLATELET
Basophils Absolute: 0 10*3/uL (ref 0.0–0.1)
Basophils Relative: 0 % (ref 0–1)
EOS PCT: 1 % (ref 0–5)
Eosinophils Absolute: 0.1 10*3/uL (ref 0.0–0.7)
HCT: 45 % (ref 36.0–46.0)
Hemoglobin: 15.3 g/dL — ABNORMAL HIGH (ref 12.0–15.0)
LYMPHS ABS: 3.8 10*3/uL (ref 0.7–4.0)
LYMPHS PCT: 46 % (ref 12–46)
MCH: 32.3 pg (ref 26.0–34.0)
MCHC: 34 g/dL (ref 30.0–36.0)
MCV: 95.1 fL (ref 78.0–100.0)
Monocytes Absolute: 0.5 10*3/uL (ref 0.1–1.0)
Monocytes Relative: 6 % (ref 3–12)
NEUTROS ABS: 3.8 10*3/uL (ref 1.7–7.7)
Neutrophils Relative %: 47 % (ref 43–77)
Platelets: 307 10*3/uL (ref 150–400)
RBC: 4.73 MIL/uL (ref 3.87–5.11)
RDW: 12.6 % (ref 11.5–15.5)
WBC: 8.3 10*3/uL (ref 4.0–10.5)

## 2014-11-28 LAB — COMPREHENSIVE METABOLIC PANEL
ALT: 35 U/L (ref 14–54)
AST: 23 U/L (ref 15–41)
Albumin: 3.9 g/dL (ref 3.5–5.0)
Alkaline Phosphatase: 80 U/L (ref 38–126)
Anion gap: 7 (ref 5–15)
BUN: 10 mg/dL (ref 6–20)
CO2: 28 mmol/L (ref 22–32)
Calcium: 8.6 mg/dL — ABNORMAL LOW (ref 8.9–10.3)
Chloride: 104 mmol/L (ref 101–111)
Creatinine, Ser: 0.83 mg/dL (ref 0.44–1.00)
GFR calc Af Amer: >60 mL/min (ref >60)
GFR calc non Af Amer: >60 mL/min (ref >60)
GLUCOSE: 93 mg/dL (ref 65–99)
POTASSIUM: 3.1 mmol/L — AB (ref 3.5–5.1)
Sodium: 139 mmol/L (ref 135–145)
TOTAL PROTEIN: 7.1 g/dL (ref 6.5–8.1)
Total Bilirubin: 0.6 mg/dL (ref 0.3–1.2)

## 2014-11-28 LAB — LIPASE, BLOOD: Lipase: 71 U/L — ABNORMAL HIGH (ref 22–51)

## 2014-11-28 LAB — PREGNANCY, URINE: PREG TEST UR: NEGATIVE

## 2014-11-28 MED ORDER — HYDROMORPHONE HCL 1 MG/ML IJ SOLN
0.5000 mg | INTRAMUSCULAR | Status: DC | PRN
  Administered 2014-11-28 (×4): 0.5 mg via INTRAVENOUS
  Administered 2014-11-29 – 2014-11-30 (×5): 1 mg via INTRAVENOUS
  Filled 2014-11-28 (×9): qty 1

## 2014-11-28 MED ORDER — ONDANSETRON HCL 4 MG/2ML IJ SOLN
4.0000 mg | Freq: Once | INTRAMUSCULAR | Status: AC
  Administered 2014-11-28: 4 mg via INTRAVENOUS
  Filled 2014-11-28: qty 2

## 2014-11-28 MED ORDER — ENOXAPARIN SODIUM 40 MG/0.4ML ~~LOC~~ SOLN
40.0000 mg | Freq: Every day | SUBCUTANEOUS | Status: DC
  Administered 2014-11-29: 40 mg via SUBCUTANEOUS
  Filled 2014-11-28 (×2): qty 0.4

## 2014-11-28 MED ORDER — ONDANSETRON HCL 4 MG PO TABS: 4.0000 mg | ORAL_TABLET | Freq: Four times a day (QID) | ORAL | Status: DC | PRN

## 2014-11-28 MED ORDER — HYDROMORPHONE HCL 1 MG/ML IJ SOLN
1.0000 mg | Freq: Once | INTRAMUSCULAR | Status: AC
  Administered 2014-11-28: 1 mg via INTRAVENOUS
  Filled 2014-11-28: qty 1

## 2014-11-28 MED ORDER — POTASSIUM CHLORIDE IN NACL 20-0.9 MEQ/L-% IV SOLN
INTRAVENOUS | Status: DC
  Administered 2014-11-28 – 2014-11-30 (×4): via INTRAVENOUS
  Filled 2014-11-28 (×6): qty 1000

## 2014-11-28 MED ORDER — ONDANSETRON HCL 4 MG/2ML IJ SOLN: 4.0000 mg | Freq: Four times a day (QID) | INTRAMUSCULAR | Status: DC | PRN

## 2014-11-28 MED ORDER — ONDANSETRON HCL 4 MG/2ML IJ SOLN: 4.0000 mg | Freq: Once | INTRAMUSCULAR | Status: AC

## 2014-11-28 MED ORDER — POTASSIUM CHLORIDE CRYS ER 20 MEQ PO TBCR
40.0000 meq | EXTENDED_RELEASE_TABLET | Freq: Once | ORAL | Status: AC
  Administered 2014-11-28: 40 meq via ORAL
  Filled 2014-11-28: qty 2

## 2014-11-28 MED ORDER — KETOROLAC TROMETHAMINE 30 MG/ML IJ SOLN
30.0000 mg | Freq: Once | INTRAMUSCULAR | Status: AC
  Administered 2014-11-28: 30 mg via INTRAVENOUS
  Filled 2014-11-28: qty 1

## 2014-11-28 MED ORDER — SODIUM CHLORIDE 0.9 % IV BOLUS (SEPSIS)
1000.0000 mL | Freq: Once | INTRAVENOUS | Status: AC
  Administered 2014-11-28: 1000 mL via INTRAVENOUS

## 2014-11-28 NOTE — ED Provider Notes (Signed)
CSN: [id number]     Arrival date & time 11/27/14  2347 History  This chart was scribed for Ripley Fraise, MD by Chester Holstein, ED Scribe. This patient was seen in room MH04/MH04 and the patient's care was started at 12:46 AM.    Chief Complaint  Patient presents with  . Abdominal Pain     Patient is a 39 y.o. female presenting with abdominal pain. The history is provided by the patient. No language interpreter was used.  Abdominal Pain Pain location:  RUQ and RLQ Pain radiates to:  Back Pain severity:  Severe Onset quality:  Sudden Duration:  4 days Timing:  Constant Progression:  Worsening Chronicity:  New Context: eating   Relieved by:  Position changes Worsened by:  Vomiting, eating and position changes Ineffective treatments:  Bowel activity (Percocet and Bactrim) Associated symptoms: anorexia, chills and vomiting   Associated symptoms: no chest pain, no dysuria, no fever, no hematochezia, no melena and no shortness of breath    HPI Comments: [name] is a 39 y.o. female with Px of HTN who presents to the Emergency Department complaining of right sided abdominal pain radiating into back with onset 4 days ago, worsening today. She notes associated generalized body aches, diaphoresis, chills, vomiting, headache, lightheadedness, dizziness, generalized weakness, and anorexia. She notes pain is aggravated with eating and alleviated with sitting up. Her last bowel movement was yesterday. Pt was seen in ED on 11/24/24 for same. Work -up showed mildly elevated lipase with UA showing signs of infection. CT scan showed no acute findings with pt's gastric sleeve stable. Pt was d/c with Rx for Percocet, Zofran, and Bactrim. Pt with h/o ovarian cyst removal, cholecystectomy, hysterectomy, and gastric bypass. Pt denies fever, chest pain, SOB, difficulty urinating, bowel or bladder incontinence, hematochezia, rash, and tick bites.   Past Medical History  Diagnosis Date  .  Sebaceous cyst   . Abscess   . Anemia   . Hypertension   . Headache(784.0)     migraines   Past Surgical History  Procedure Laterality Date  . Partial hysterectomy    . Cholecystectomy open    . Ovarian cyst removal    . R arm surgery    . Gastric bypass  2011    gastric sleeve  . Knee surgery  2005    left  . Cystectomy      2004 axilla bil  . Abdominal hysterectomy    . Medial patellofemoral ligament repair Left 06/08/2013    Procedure: DIAGNOSTIC OPERATIVE ARTHROSCOPY, OPEN MEDIAL PATELLA FEMORAL LIGAMENT RECONSTRUCTION;  Surgeon: Meredith Pel, MD;  Location: [place];  Service: Orthopedics;  Laterality: Left;  with Hamstring autograft  . Fracture surgery      rt arm   Family History  Problem Relation Age of Onset  . Cancer Maternal Grandfather     lung   History  Substance Use Topics  . Smoking status: Current Every Day Smoker -- 0.50 packs/day for 10 years    Types: Cigarettes  . Smokeless tobacco: Never Used  . Alcohol Use: Yes   OB History    No data available     Review of Systems  Constitutional: Positive for chills and diaphoresis. Negative for fever.  Respiratory: Negative for shortness of breath.   Cardiovascular: Negative for chest pain.  Gastrointestinal: Positive for vomiting, abdominal pain and anorexia. Negative for blood in stool, melena, hematochezia and bowel incontinence.  Genitourinary: Negative for bladder incontinence and dysuria.  Musculoskeletal: Positive for  back pain.  Skin: Negative for rash.  Neurological: Positive for dizziness, weakness, light-headedness and headaches.  All other systems reviewed and are negative.     Allergies  Penicillins  Home Medications   Prior to Admission medications   Medication Sig Start Date End Date Taking? Authorizing Provider  acetaminophen (TYLENOL) 500 MG tablet Take 500 mg by mouth every 8 (eight) hours as needed for mild pain or headache.    Historical Provider, MD  ALBUTEROL IN Inhale  into the lungs.    Historical Provider, MD  amLODipine (NORVASC) 10 MG tablet Take 10 mg by mouth daily.    Historical Provider, MD  Celecoxib (CELEBREX PO) Take 300 mg by mouth.    Historical Provider, MD  cyclobenzaprine (FLEXERIL) 10 MG tablet Take 10 mg by mouth 3 (three) times daily as needed for muscle spasms.    Historical Provider, MD  gabapentin (NEURONTIN) 100 MG capsule Take 100 mg by mouth 2 (two) times daily.    Historical Provider, MD  Menthol-Methyl Salicylate (BEN GAY GREASELESS) 10-15 % greaseless cream Apply topically at bedtime.    Historical Provider, MD  ondansetron (ZOFRAN ODT) 4 MG disintegrating tablet Take 1 tablet (4 mg total) by mouth every 8 (eight) hours as needed for nausea. 11/25/14   Larene Pickett, PA-C  oxyCODONE-acetaminophen (PERCOCET/ROXICET) 5-325 MG per tablet Take 1 tablet by mouth every 4 (four) hours as needed. 11/25/14   Larene Pickett, PA-C  sulfamethoxazole-trimethoprim (BACTRIM DS,SEPTRA DS) 800-160 MG per tablet Take 1 tablet by mouth 2 (two) times daily. 11/25/14 12/02/14  Larene Pickett, PA-C  temazepam (RESTORIL) 15 MG capsule Take 15 mg by mouth at bedtime as needed for sleep.    Historical Provider, MD  topiramate (TOPAMAX) 50 MG tablet Take 50 mg by mouth 2 (two) times daily.    Historical Provider, MD   BP 155/108 mmHg  Pulse 76  Temp(Src) 98.3 F (36.8 C) (Oral)  Resp 18  SpO2 99% Physical Exam  Nursing note and vitals reviewed. CONSTITUTIONAL: Well developed/well nourished HEAD: Normocephalic/atraumatic EYES: EOMI/PERRL ENMT: Mucous membranes moist NECK: supple no meningeal signs SPINE/BACK:entire spine nontender CV: S1/S2 noted, no murmurs/rubs/gallops noted LUNGS: Lungs are clear to auscultation bilaterally, no apparent distress ABDOMEN: soft, nontender, no rebound or guarding, bowel sounds noted throughout abdomen GU:no cva tenderness NEURO: Pt is awake/alert/appropriate, moves all extremitiesx4.  No facial droop.   EXTREMITIES:  pulses normal/equal, full ROM SKIN: warm, color normal PSYCH: no abnormalities of mood noted, alert and oriented to situation   ED Course  Procedures  DIAGNOSTIC STUDIES: Oxygen Saturation is 99% on room air, normal by my interpretation.    COORDINATION OF CARE: 12:54 AM Discussed treatment plan with patient at beside, the patient agrees with the plan and has no further questions at this time.  1:50 AM Repeat assessment Pt is uncomfortable She has focal abd tenderness in RUQ and epigastric region There is no lower abdominal tenderness to suggest occult appendicitis or GYN pathology Will treat pain, add on labs and screen with acute abd series 4:19 AM Pt still with abd pain.  It is located mainly in RUQ/epigastric region No other focal tenderness or rigidity I am unable to control her pain after multiple doses of narcotics She has had multiple imaging modalities without cause She does have h/o gastric bypass performed in [place] several yrs ago Will admit due to intractable pain.  Would benefit from monitoring and possible GI consultation D/w doutova will admit to [facility]  Labs Review Labs Reviewed  URINALYSIS, ROUTINE W REFLEX MICROSCOPIC (NOT AT [facility]) - Abnormal; Notable for the following:    Color, Urine AMBER (*)    APPearance CLOUDY (*)    Bilirubin Urine SMALL (*)    All other components within normal limits  COMPREHENSIVE METABOLIC PANEL - Abnormal; Notable for the following:    Potassium 3.1 (*)    Calcium 8.6 (*)    All other components within normal limits  CBC WITH DIFFERENTIAL/PLATELET - Abnormal; Notable for the following:    Hemoglobin 15.3 (*)    All other components within normal limits  LIPASE, BLOOD - Abnormal; Notable for the following:    Lipase 71 (*)    All other components within normal limits  URINE CULTURE  PREGNANCY, URINE    Imaging Review Dg Abd Acute W/chest  11/28/2014   CLINICAL DATA:  Right-sided abdominal pain radiating to the back,  onset 4 days ago. Generalized body aches, diaphoresis, chills, vomiting, headache, lightheadedness, dizziness, generalized weakness, anorexia.  EXAM: DG ABDOMEN ACUTE W/ 1V CHEST  COMPARISON:  CT abdomen and pelvis 11/25/2014.  Chest 06/15/2014.  FINDINGS: Normal heart size and pulmonary vascularity. No focal airspace disease or consolidation in the lungs. No blunting of costophrenic angles. No pneumothorax. Mediastinal contours appear intact.  Scattered gas and stool in the colon. No small or large bowel distention. No free intra-abdominal air. No abnormal air-fluid levels. No radiopaque stones. Visualized bones appear intact. Surgical clips in the right upper quadrant.  IMPRESSION: No evidence of active pulmonary disease. Normal nonobstructive bowel gas pattern.   Electronically Signed   By: Lucienne Capers M.D.   On: 11/28/2014 02:31     EKG Interpretation   Date/Time:  Monday November 28 2014 01:06:22 EDT Ventricular Rate:  68 PR Interval:  156 QRS Duration: 90 QT Interval:  424 QTC Calculation: 450 R Axis:   89 Text Interpretation:  Normal sinus rhythm Normal ECG No significant change  since last tracing Confirmed by Christy Gentles  MD, Kaytlyn Din ([id number]) on 11/28/2014  1:08:04 AM     Medications  HYDROmorphone (DILAUDID) injection 1 mg (not administered)  ondansetron (ZOFRAN) injection 4 mg (4 mg Intravenous Given 11/28/14 0035)  sodium chloride 0.9 % bolus 1,000 mL (0 mLs Intravenous Stopped 11/28/14 0251)  ketorolac (TORADOL) 30 MG/ML injection 30 mg (30 mg Intravenous Given 11/28/14 0113)  ondansetron (ZOFRAN) injection 4 mg (0 mg Intravenous Duplicate [birth year] 9735)  HYDROmorphone (DILAUDID) injection 1 mg (1 mg Intravenous Given 11/28/14 0211)  ondansetron (ZOFRAN) injection 4 mg (4 mg Intravenous Given 11/28/14 0214)  HYDROmorphone (DILAUDID) injection 1 mg (1 mg Intravenous Given 11/28/14 0252)  potassium chloride SA (K-DUR,KLOR-CON) CR tablet 40 mEq (40 mEq Oral Given 11/28/14 0349)    MDM    Final diagnoses:  Intractable abdominal pain  Hypokalemia    Nursing notes including past medical history and social history reviewed and considered in documentation Previous records reviewed and considered Labs/vital reviewed myself and considered during evaluation xrays/imaging reviewed by myself and considered during evaluation   I personally performed the services described in this documentation, which was scribed in my presence. The recorded information has been reviewed and is accurate.        Ripley Fraise, MD 11/28/14 [phone]

## 2014-11-28 NOTE — Progress Notes (Signed)
39 yo F with persistent abdominal pain epigastric/ RUQ. Numerous ER visits. CT of abd/pelvis from 11/25/2014 did not show any abnormalities to explain pain. Being admitted for pain control and possibly Gi consult in AM. Reportedly no hx of chronic pain.  Had hx of Gastric bypass done in [place].   Jilliana Burkes 4:19 AM

## 2014-11-28 NOTE — H&P (Signed)
[facility] History and Physical  [name] RKY:[id number] DOB: 11-13-1975 DOA: 11/27/2014  Referring physician: [facility] PCP: Philis Fendt, MD   Chief Complaint: abdominal pain  HPI: [name] is a 39 y.o. female  With PHMx of gastric bypass in [place], HTN.  She was seen in the ER with abdominal pain on 7/8 and given bactrim for possible UTI.  As well as percocet and zofran.  She presented back to the ER on 7/10 with unrelieved abdominal pain and nausea.  She also developed body aches, dizziness with the pain, and chills.  She stated, she feels like she has the flu.  Patient had a CT Scan on 7/8 that found no issues with her gastric sleeve nor source of her pain.  He lipase has increased mildly from 68 to 71.  She also has had soft stools (not diarrhea) with "white" in it.    She describes her pain as sharp and radiates through to her back.  Mother has h/o pancreatitis that the patient thought was due to mother's lupus.  When patient has pain it makes her "lightheaded" and see stars   Review of Systems:  All systems reviewed, negative unless stated above   Past Medical History  Diagnosis Date  . Sebaceous cyst   . Abscess   . Anemia   . Hypertension   . Headache(784.0)     migraines   Past Surgical History  Procedure Laterality Date  . Partial hysterectomy    . Cholecystectomy open    . Ovarian cyst removal    . R arm surgery    . Gastric bypass  2011    gastric sleeve  . Knee surgery  2005    left  . Cystectomy      2004 axilla bil  . Abdominal hysterectomy    . Medial patellofemoral ligament repair Left 06/08/2013    Procedure: DIAGNOSTIC OPERATIVE ARTHROSCOPY, OPEN MEDIAL PATELLA FEMORAL LIGAMENT RECONSTRUCTION;  Surgeon: Meredith Pel, MD;  Location: [facility];  Service: Orthopedics;  Laterality: Left;  with Hamstring autograft  . Fracture surgery      rt arm   Social History:  reports that she has been smoking Cigarettes.  She has a 5  pack-year smoking history. She has never used smokeless tobacco. She reports that she drinks alcohol. She reports that she does not use illicit drugs.  Allergies  Allergen Reactions  . Penicillins Anaphylaxis, Swelling and Rash    Face and throat swell    Family History  Problem Relation Age of Onset  . Cancer Maternal Grandfather     lung    Prior to Admission medications   Medication Sig Start Date End Date Taking? Authorizing Provider  acetaminophen (TYLENOL) 500 MG tablet Take 500 mg by mouth every 8 (eight) hours as needed for mild pain or headache.    Historical Provider, MD  ALBUTEROL IN Inhale into the lungs.    Historical Provider, MD  amLODipine (NORVASC) 10 MG tablet Take 10 mg by mouth daily.    Historical Provider, MD  Celecoxib (CELEBREX PO) Take 300 mg by mouth.    Historical Provider, MD  cyclobenzaprine (FLEXERIL) 10 MG tablet Take 10 mg by mouth 3 (three) times daily as needed for muscle spasms.    Historical Provider, MD  gabapentin (NEURONTIN) 100 MG capsule Take 100 mg by mouth 2 (two) times daily.    Historical Provider, MD  Menthol-Methyl Salicylate (BEN GAY GREASELESS) 10-15 % greaseless cream Apply topically at bedtime.  Historical Provider, MD  ondansetron (ZOFRAN ODT) 4 MG disintegrating tablet Take 1 tablet (4 mg total) by mouth every 8 (eight) hours as needed for nausea. 11/25/14   Larene Pickett, PA-C  oxyCODONE-acetaminophen (PERCOCET/ROXICET) 5-325 MG per tablet Take 1 tablet by mouth every 4 (four) hours as needed. 11/25/14   Larene Pickett, PA-C  sulfamethoxazole-trimethoprim (BACTRIM DS,SEPTRA DS) 800-160 MG per tablet Take 1 tablet by mouth 2 (two) times daily. 11/25/14 12/02/14  Larene Pickett, PA-C  temazepam (RESTORIL) 15 MG capsule Take 15 mg by mouth at bedtime as needed for sleep.    Historical Provider, MD  topiramate (TOPAMAX) 50 MG tablet Take 50 mg by mouth 2 (two) times daily.    Historical Provider, MD   Physical Exam: Filed Vitals:   11/28/14  0317 11/28/14 0417 11/28/14 0550 11/28/14 0706  BP: 143/91 127/80 118/77 150/82  Pulse: 70 60 52 55  Temp:   97.7 F (36.5 C) 97.3 F (36.3 C)  TempSrc:   Oral Oral  Resp:  18 18 20   Height:    6' (1.829 m)  Weight:    105 kg (231 lb 7.7 oz)  SpO2: 100% 97% 94% 100%    Wt Readings from Last 3 Encounters:  11/28/14 105 kg (231 lb 7.7 oz)  11/25/14 99.338 kg (219 lb)  10/01/14 102.059 kg (225 lb)    General:  Appears calm and comfortable Eyes: PERRL, normal lids, irises & conjunctiva ENT: grossly normal hearing, lips & tongue Neck: no LAD, masses or thyromegaly Cardiovascular: RRR, no m/r/g. No LE edema. Respiratory: CTA bilaterally, no w/r/r. Normal respiratory effort. Abdomen: +epigstaric tenderness Skin: no rash or induration seen on limited exam Musculoskeletal: grossly normal tone BUE/BLE Psychiatric: grossly normal mood and affect, speech fluent and appropriate Neurologic: grossly non-focal.          Labs on Admission:  Basic Metabolic Panel:  Recent Labs Lab 11/25/14 1350 11/28/14 0030  NA 141 139  K 3.5 3.1*  CL 106 104  CO2 28 28  GLUCOSE 89 93  BUN 11 10  CREATININE 0.80 0.83  CALCIUM 8.6* 8.6*   Liver Function Tests:  Recent Labs Lab 11/25/14 1350 11/28/14 0030  AST 19 23  ALT 14 35  ALKPHOS 68 80  BILITOT 0.5 0.6  PROT 6.7 7.1  ALBUMIN 3.4* 3.9    Recent Labs Lab 11/25/14 1350 11/28/14 0030  LIPASE 68* 71*   No results for input(s): AMMONIA in the last 168 hours. CBC:  Recent Labs Lab 11/25/14 1350 11/28/14 0030  WBC 6.4 8.3  NEUTROABS 3.4 3.8  HGB 14.8 15.3*  HCT 44.1 45.0  MCV 95.0 95.1  PLT 285 307   Cardiac Enzymes:  Recent Labs Lab 11/25/14 1350  TROPONINI <0.03    BNP (last 3 results) No results for input(s): BNP in the last 8760 hours.  ProBNP (last 3 results) No results for input(s): PROBNP in the last 8760 hours.  CBG: No results for input(s): GLUCAP in the last 168 hours.  Radiological Exams on  Admission: Dg Abd Acute W/chest  11/28/2014   CLINICAL DATA:  Right-sided abdominal pain radiating to the back, onset 4 days ago. Generalized body aches, diaphoresis, chills, vomiting, headache, lightheadedness, dizziness, generalized weakness, anorexia.  EXAM: DG ABDOMEN ACUTE W/ 1V CHEST  COMPARISON:  CT abdomen and pelvis 11/25/2014.  Chest 06/15/2014.  FINDINGS: Normal heart size and pulmonary vascularity. No focal airspace disease or consolidation in the lungs. No blunting of costophrenic angles. No  pneumothorax. Mediastinal contours appear intact.  Scattered gas and stool in the colon. No small or large bowel distention. No free intra-abdominal air. No abnormal air-fluid levels. No radiopaque stones. Visualized bones appear intact. Surgical clips in the right upper quadrant.  IMPRESSION: No evidence of active pulmonary disease. Normal nonobstructive bowel gas pattern.   Electronically Signed   By: Lucienne Capers M.D.   On: 11/28/2014 02:31      Assessment/Plan Active Problems:   Intractable abdominal pain   Hypokalemia  Abdominal pain with elevated lipase (denies alcohol) -check RUQ u/s - IVF -pain control with dilaudid -check lipase in AM -stool studies -may need GI consult for scope if not improved in AM  Hypokalemia -replete  HTN -PRN pain med  Tobacco abuse -encourage cessation   Code Status: full DVT Prophylaxis: Family Communication: patient Disposition Plan:   Time spent: 63 min  Eulogio Bear [facility] Pager [phone]

## 2014-11-28 NOTE — Care Management Note (Addendum)
Case Management Note  Patient Details  Name: [name] MRN: [id number] Date of Birth: 1975-10-21  Subjective/Objective:                 Patient with H/O gastric sleeve, nausea and abdominal pain, CT not identifying cause of pain. Patient lives at home, independent. Will follow for discharge needs.   Action/Plan:  No needs identified, discharge to  Home self care. Expected Discharge Date:                  Expected Discharge Plan:  Home/Self Care  In-House Referral:  Clinical Social Work  Discharge planning Services  CM Consult  Post Acute Care Choice:    Choice offered to:     DME Arranged:    DME Agency:     HH Arranged:    HH Agency:     Status of Service:  In process, will continue to follow  Medicare Important Message Given:    Date Medicare IM Given:    Medicare IM give by:    Date Additional Medicare IM Given:    Additional Medicare Important Message give by:     If discussed at [place] of Stay Meetings, dates discussed:    Additional Comments:  Carles Collet, RN 11/28/2014, 2:20 PM

## 2014-11-28 NOTE — Progress Notes (Signed)
Utilization Review completed. Ronae Noell RN BSN CM 

## 2014-11-28 NOTE — ED Notes (Signed)
C/o legs cramping, abd and back pain, also dizziness, "like I have the flu".

## 2014-11-28 NOTE — Progress Notes (Signed)
Patient arrived to unit. Alert and oriented x3. Pain 8/10 in abdomen. Patient states pain is generalized in abdomen, but more intense on right side and increased pain with palpation. MD admissions paged to make aware of pt's arrival and to obtain Attending MD. Awaiting orders at this time. VSS.

## 2014-11-29 ENCOUNTER — Encounter (HOSPITAL_COMMUNITY): Payer: Self-pay | Admitting: Physician Assistant

## 2014-11-29 DIAGNOSIS — R112 Nausea with vomiting, unspecified: Secondary | ICD-10-CM

## 2014-11-29 DIAGNOSIS — R1011 Right upper quadrant pain: Secondary | ICD-10-CM

## 2014-11-29 LAB — BASIC METABOLIC PANEL
Anion gap: 5 (ref 5–15)
BUN: <5 mg/dL — ABNORMAL LOW (ref 6–20)
CHLORIDE: 109 mmol/L (ref 101–111)
CO2: 26 mmol/L (ref 22–32)
CREATININE: 0.69 mg/dL (ref 0.44–1.00)
Calcium: 8.2 mg/dL — ABNORMAL LOW (ref 8.9–10.3)
GFR calc Af Amer: >60 mL/min (ref >60)
GFR calc non Af Amer: >60 mL/min (ref >60)
Glucose, Bld: 75 mg/dL (ref 65–99)
Potassium: 4.2 mmol/L (ref 3.5–5.1)
SODIUM: 140 mmol/L (ref 135–145)

## 2014-11-29 LAB — CBC WITH DIFFERENTIAL/PLATELET
Basophils Absolute: 0 10*3/uL (ref 0.0–0.1)
Basophils Relative: 0 % (ref 0–1)
Eosinophils Absolute: 0.1 10*3/uL (ref 0.0–0.7)
Eosinophils Relative: 3 % (ref 0–5)
HCT: 40.7 % (ref 36.0–46.0)
Hemoglobin: 13.2 g/dL (ref 12.0–15.0)
Lymphocytes Relative: 45 % (ref 12–46)
Lymphs Abs: 1.6 10*3/uL (ref 0.7–4.0)
MCH: 31.7 pg (ref 26.0–34.0)
MCHC: 32.4 g/dL (ref 30.0–36.0)
MCV: 97.6 fL (ref 78.0–100.0)
Monocytes Absolute: 0.3 10*3/uL (ref 0.1–1.0)
Monocytes Relative: 8 % (ref 3–12)
NEUTROS ABS: 1.6 10*3/uL — AB (ref 1.7–7.7)
Neutrophils Relative %: 44 % (ref 43–77)
PLATELETS: 218 10*3/uL (ref 150–400)
RBC: 4.17 MIL/uL (ref 3.87–5.11)
RDW: 13.2 % (ref 11.5–15.5)
WBC: 3.6 10*3/uL — ABNORMAL LOW (ref 4.0–10.5)

## 2014-11-29 LAB — LIPID PANEL
CHOLESTEROL: 91 mg/dL (ref 0–200)
HDL: 33 mg/dL — ABNORMAL LOW (ref >40)
LDL CALC: 48 mg/dL (ref 0–99)
Total CHOL/HDL Ratio: 2.8 RATIO
Triglycerides: 48 mg/dL (ref <150)
VLDL: 10 mg/dL (ref 0–40)

## 2014-11-29 LAB — LIPASE, BLOOD: Lipase: 48 U/L (ref 22–51)

## 2014-11-29 LAB — RAPID URINE DRUG SCREEN, HOSP PERFORMED
AMPHETAMINES: NOT DETECTED
Barbiturates: NOT DETECTED
Benzodiazepines: NOT DETECTED
Cocaine: NOT DETECTED
Opiates: POSITIVE — AB
TETRAHYDROCANNABINOL: NOT DETECTED

## 2014-11-29 NOTE — Consult Note (Signed)
[facility] Consult: 2:29 PM 11/29/2014  LOS: 1 day    Referring Provider: Dr Tat  Primary Care Physician:  Philis Fendt, MD Primary Gastroenterologist:  unassigned    Reason for Consultation:  N/v/abd pain   HPI: [name] is a 39 y.o. female.  S/p gastric sleeve bariatric surgery in [place] in 2010 (by surgeon Dr Vincente Liberty who is certified and does surgery in [place] as well; but price for this self-pay  surgery was cheaper in [place]).  S/p lap cholecystectomy 2002, laparoscopic fibroid resection, hysterectomy, appendectomy. O/A.  Axillary hydratenitis. Pernicious and iron def anemia but taken off B12 shots and oral iron for at least one year.    For up to 2 years has had issues with post-prandial fullness and sometimes n/v. Was intermittent, infrequent but in last several months increasing frequency.  Last 2 weeks constant post-prandial nausea and pain in RUQ that is relieved with emesis.  Pain radiates towards the back, behind the RUQ.  Even just 6 oz of liquid will trigger the attacks. The pain reminds her of when she had pelvic fibroids and when she had GB disease.  Has taken no meds for this.  Never on a PPI.  Never had EGD or fluoro studies. Overall weight stable but may have lost 2 or 3 # in last 2 weeks. Overall weight loss since sleeve surgery is 100#.   Stools about 2 to 3 x per week, no change except stools softer but not frequent.  Rarely uses oxycodone or celebrex Dad has Crohn's disease, Mom has Lupus.     CT:  ? Incompetent ileocecal valve. Small degree free fluid in posterior pelvis, moderated gas in sigmoid. Mild fatty liver.  Ultrasound: 8 mmCBD, prior cholecystectomy.  abd films: unremarkable Labs: with lipase to 71 (up to 60 07/2013) . LFTs consistently normal.  Stool  pathogen panel pending.      Past Medical History  Diagnosis Date  . Anemia 2015    "pernicious" and iron deficiency.  as of 2015/2016 no longer requiring po iron or B12 shots.   . Hypertension   . Headache(784.0)     migraines  . Nausea & vomiting 11/28/2014  . Hydradenitis 2001    axillary, multiple surgical I & Ds/excisions.  . Obesity (BMI 30.0-34.9)   . Chondromalacia 2005    left knee. arthroscopy x 2.   . Adnexal cyst 2013    right  . Pityriasis rosea     pt also gives hx of seborrheic dermatitis.     Past Surgical History  Procedure Laterality Date  . Partial hysterectomy    . Laparoscopic cholecystectomy  2002  . Ovarian cyst removal    . R arm surgery    . Gastric bypass  2011    gastric sleeve  . Knee surgery  2005    left  . Cystectomy      2004 axilla bil  . Abdominal hysterectomy    . Medial patellofemoral ligament repair Left 06/08/2013    Procedure: DIAGNOSTIC OPERATIVE ARTHROSCOPY, OPEN MEDIAL PATELLA  FEMORAL LIGAMENT RECONSTRUCTION;  Surgeon: Meredith Pel, MD;  Location: [facility];  Service: Orthopedics;  Laterality: Left;  with Hamstring autograft  . Fracture surgery      rt arm    Prior to Admission medications   Medication Sig Start Date End Date Taking? Authorizing Provider  acetaminophen (TYLENOL) 500 MG tablet Take 1,000 mg by mouth every 8 (eight) hours as needed for mild pain or headache.    Yes Historical Provider, MD  albuterol (PROVENTIL HFA;VENTOLIN HFA) 108 (90 BASE) MCG/ACT inhaler Inhale 2 puffs into the lungs every 6 (six) hours as needed (chronic bronchitis). ProAir   Yes Historical Provider, MD  amLODipine (NORVASC) 10 MG tablet Take 10 mg by mouth at bedtime.    Yes Historical Provider, MD  aspirin-acetaminophen-caffeine (EXCEDRIN MIGRAINE) [phone] MG per tablet Take 2 tablets by mouth 3 (three) times daily as needed for headache.   Yes Historical Provider, MD  celecoxib (CELEBREX) 100 MG capsule Take 100 mg by mouth 3 (three)  times daily as needed (back pain).   Yes Historical Provider, MD  Cyanocobalamin (VITAMIN B-12 SL) Place 1 tablet under the tongue daily as needed (leg cramps).   Yes Historical Provider, MD  cyclobenzaprine (FLEXERIL) 10 MG tablet Take 10 mg by mouth 3 (three) times daily as needed for muscle spasms (back pain).    Yes Historical Provider, MD  gabapentin (NEURONTIN) 300 MG capsule Take 300 mg by mouth 3 (three) times daily as needed (back pain). Max 4 capsules daily   Yes Historical Provider, MD  ibuprofen (ADVIL,MOTRIN) 200 MG tablet Take 400-800 mg by mouth every 6 (six) hours as needed (pain).   Yes Historical Provider, MD  ondansetron (ZOFRAN ODT) 4 MG disintegrating tablet Take 1 tablet (4 mg total) by mouth every 8 (eight) hours as needed for nausea. 11/25/14  Yes Larene Pickett, PA-C  Oxycodone HCl 20 MG TABS Take 20 mg by mouth 3 (three) times daily as needed (severe pain).  11/10/14  Yes Historical Provider, MD  oxyCODONE-acetaminophen (PERCOCET/ROXICET) 5-325 MG per tablet Take 1 tablet by mouth every 4 (four) hours as needed. Patient taking differently: Take 1 tablet by mouth every 4 (four) hours as needed (pain).  11/25/14  Yes Larene Pickett, PA-C  POTASSIUM PO Take 1 tablet by mouth daily as needed (leg cramps).   Yes Historical Provider, MD  sulfamethoxazole-trimethoprim (BACTRIM DS,SEPTRA DS) 800-160 MG per tablet Take 1 tablet by mouth 2 (two) times daily. Patient taking differently: Take 1 tablet by mouth 2 (two) times daily. 5 day course started 11/26/14 11/25/14 12/02/14 Yes Larene Pickett, PA-C    Scheduled Meds: . enoxaparin (LOVENOX) injection  40 mg Subcutaneous Daily   Infusions: . 0.9 % NaCl with KCl 20 mEq / L 75 mL/hr at 11/28/14 2338   PRN Meds: HYDROmorphone (DILAUDID) injection, ondansetron **OR** ondansetron (ZOFRAN) IV   Allergies as of 11/27/2014 - Review Complete 11/27/2014  Allergen Reaction Noted  . Penicillins Anaphylaxis, Swelling, and Rash 01/14/2011     Family History  Problem Relation Age of Onset  . Cancer Maternal Grandfather     lung  . Crohn's disease Father   . Lupus Mother     History   Social History  . Marital Status: Divorced    Spouse Name: N/A  . Number of Children: N/A  . Years of Education: N/A   Occupational History  . 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  Social History Main Topics  . Smoking status: Current Every Day Smoker -- 0.50 packs/day for 10 years    Types: Cigarettes  . Smokeless tobacco: Never Used  . Alcohol Use: Yes  . Drug Use: No  . Sexual Activity: No   Other Topics Concern  . Not on file   Social History Narrative    REVIEW OF SYSTEMS: Constitutional:  Per HPI.   ENT:  No nose bleeds Pulm:  No SOB or cough CV:  No palpitations, no LE edema.  GU:  No hematuria, no frequency GI:  Per HPI Heme:  No longer requiring po iron or b12 shots   Transfusions:  None ever Neuro:  No headaches, no peripheral tingling or numbness Derm:  Tends to dry skin  Endocrine:  No sweats or chills.  No polyuria or dysuria Immunization:  Not queried.  Travel:  None beyond local counties in last few months.    PHYSICAL EXAM: Vital signs in last 24 hours: Filed Vitals:   11/29/14 1428  BP: 146/95  Pulse: 54  Temp: 98.8 F (37.1 C)  Resp: 22   Wt Readings from Last 3 Encounters:  11/28/14 231 lb 7.7 oz (105 kg)  11/25/14 219 lb (99.338 kg)  10/01/14 225 lb (102.059 kg)    General: pleasant, well appearing, comfortable Head:  No asymmetry or swelling  Eyes:  No icterus or pallor Ears:  Not HOH  Nose:  No congestion or discharge Mouth:  Clear, moist Neck:  No mass, no tmg, no JVD Lungs:  Clear bil.  Unlabored resps.  Heart: RRR.  S1/s2.  No mrg Abdomen:  Soft, slight tenderness in RUQ/epigastrum.  No mass,, no HSM.  No hernias..   Rectal: deferred   Musc/Skeltl: no joint swelling or redness Extremities:  No CCE  Neurologic:  Pleasant,  oriented x 3.  Good historian.  No limb weakness, no tremor Skin:  No rash or sores Tattoos:  On arms, professional appearing.  Nodes:  No cervical or inguinal adenopathy.    Psych:  Pleasant, relaxed, not depressed or anxious.   Intake/Output from previous day: 07/11 0701 - 07/12 0700 In: 1666.3 [P.O.:150; I.V.:1516.3] Out: 200 [Urine:200] Intake/Output this shift: Total I/O In: 240 [P.O.:240] Out: -   LAB RESULTS:  Recent Labs  11/28/14 0030 11/29/14 0800  WBC 8.3 3.6*  HGB 15.3* 13.2  HCT 45.0 40.7  PLT 307 218   BMET Lab Results  Component Value Date   NA 140 11/29/2014   NA 139 11/28/2014   NA 141 11/25/2014   K 4.2 11/29/2014   K 3.1* 11/28/2014   K 3.5 11/25/2014   CL 109 11/29/2014   CL 104 11/28/2014   CL 106 11/25/2014   CO2 26 11/29/2014   CO2 28 11/28/2014   CO2 28 11/25/2014   GLUCOSE 75 11/29/2014   GLUCOSE 93 11/28/2014   GLUCOSE 89 11/25/2014   BUN <5* 11/29/2014   BUN 10 11/28/2014   BUN 11 11/25/2014   CREATININE 0.69 11/29/2014   CREATININE 0.83 11/28/2014   CREATININE 0.80 11/25/2014   CALCIUM 8.2* 11/29/2014   CALCIUM 8.6* 11/28/2014   CALCIUM 8.6* 11/25/2014   LFT  Recent Labs  11/28/14 0030  PROT 7.1  ALBUMIN 3.9  AST 23  ALT 35  ALKPHOS 80  BILITOT 0.6   PT/INR No results found for: INR, PROTIME Hepatitis Panel No results for input(s): HEPBSAG, HCVAB, HEPAIGM, HEPBIGM in the last 72 hours. C-Diff No components found for: CDIFF Lipase  Component Value Date/Time   LIPASE 48 11/29/2014 0615    Drugs of Abuse  No results found for: LABOPIA, COCAINSCRNUR, LABBENZ, AMPHETMU, THCU, LABBARB   RADIOLOGY STUDIES: Dg Abd Acute W/chest  11/28/2014   CLINICAL DATA:  Right-sided abdominal pain radiating to the back, onset 4 days ago. Generalized body aches, diaphoresis, chills, vomiting, headache, lightheadedness, dizziness, generalized weakness, anorexia.  EXAM: DG ABDOMEN ACUTE W/ 1V CHEST  COMPARISON:  CT abdomen and  pelvis 11/25/2014.  Chest 06/15/2014.  FINDINGS: Normal heart size and pulmonary vascularity. No focal airspace disease or consolidation in the lungs. No blunting of costophrenic angles. No pneumothorax. Mediastinal contours appear intact.  Scattered gas and stool in the colon. No small or large bowel distention. No free intra-abdominal air. No abnormal air-fluid levels. No radiopaque stones. Visualized bones appear intact. Surgical clips in the right upper quadrant.  IMPRESSION: No evidence of active pulmonary disease. Normal nonobstructive bowel gas pattern.   Electronically Signed   By: Lucienne Capers M.D.   On: 11/28/2014 02:31   US Abdomen Limited Ruq  11/28/2014   CLINICAL DATA:  Abdominal pain  EXAM: US ABDOMEN LIMITED - RIGHT UPPER QUADRANT  COMPARISON:  11/25/2014  FINDINGS: Gallbladder:  Previous cholecystectomy.  Common bile duct:  Diameter: 8 mm.  Liver:  No focal lesion identified. Within normal limits in parenchymal echogenicity.  IMPRESSION: 1. No acute findings. 2. Prior cholecystectomy.   Electronically Signed   By: Kerby Moors M.D.   On: 11/28/2014 17:18    ENDOSCOPIC STUDIES: none  IMPRESSION:   *  N/v abdominal pain.  No cause seen on multiple imaging studies.  Lipase minimally elevated and pain locus is in region of RUQ into corresponding area of back.  ? Something to do with the gastric sleeve.  *  Multiple abdomino-pelvic surgeries as per HPI.   *  Obesity.  BMI 31. Overall 100# weight loss since 2010 bariatric surgery.     PLAN:     *  EGD tomorrow.   *  ? Ask bariatric surgery to consult?  *  pt requested advance diet to solids, I d/w Dr Tat and it is ok to advnce to soft diet.  *  Stopped lovenox, to allow for biopsy if needed    Azucena Freed  11/29/2014, 2:29 PM Pager: [phone]

## 2014-11-29 NOTE — Progress Notes (Signed)
PROGRESS NOTE  [name] QIO:[id number] DOB: [birth year] DOA: 11/27/2014 PCP: Dorrene German, MD  Brief history 39 year old female with a history of hypertension, migraine headache, and gastric bypass performed in [place] presented with 3-day history of abdominal pain. The patient visited emergency department on 11/25/2014. After an unremarkable CT abdomen and pelvis, the patient was sent home with Bactrim and Percocet. She represented to the emergency department on 11/27/2014 with nausea, vomiting, and worsening abdominal pain. The patient also complained of subjective fevers and chills with myalgias and dizziness. She denied any diarrhea, hematochezia, melena, dysuria, hematuria. Since admission, the patient continued to have intermittent dry heaves with abdominal pain. Right upper quadrant ultrasound was negative. Gastroenterology was consulted for further evaluation. Patient states that she has not sexually active presently and denies any vaginal discharge.   Assessment/Plan: Abdominal pain with nausea and vomiting -11/25/2014 CT abdomen and pelvis negative for acute findings -11/28/2014 RUQ [place] neg -lipase 68 with normal hepatic enzymes -consulted [facility] -Urinalysis is negative -Urine drug screen -Continue clear liquids for now -continue IVF, antiemetics Hypokalemia  -Repleted  -Check magnesium   hypertension -Amlodipine held secondary to soft blood pressure -Blood pressure acceptable off of medications -Continue to monitor Myalgias -Afebrile and hemodynamically stable without leukocytosis -Check HIV -CPK   Family Communication:   Son updated at beside Disposition Plan:   Home when medically stable       Procedures/Studies: Ct Abdomen Pelvis W Contrast  11/25/2014   CLINICAL DATA:  Diffuse abdominal pain for 1 day  EXAM: CT ABDOMEN AND PELVIS WITH CONTRAST  TECHNIQUE: Multidetector CT imaging of the abdomen and pelvis was performed using the  standard protocol following bolus administration of intravenous contrast.  CONTRAST:  25mL OMNIPAQUE IOHEXOL 300 MG/ML SOLN, OMNIPAQUE IOHEXOL 300 MG/ML SOLN  COMPARISON:  12/04/2011  FINDINGS: Lung bases are unremarkable. Sagittal images of the spine shows mild degenerative changes lumbar spine. Status postcholecystectomy. Enhanced liver is stable. Tiny subcapsular hyperdense liver lesion anterior aspect of right hepatic lobe measures 6 mm stable in size in appearance from prior exam. CBD measures 8 mm in diameter. The pancreas, spleen and adrenal glands are unremarkable. Abdominal aorta is unremarkable.  No small bowel obstruction.  No ascites or free air.  No adenopathy.  Again noted status post gastric bypass surgery. There is no pericecal inflammation. Normal appendix. There is small fecal like material within terminal ileum probable incompetent ileocecal valve. No thickened or dilated small bowel loops are noted. Moderate gaseous distension of sigmoid colon. Some gas noted within rectum. There is no colitis or diverticulitis. Small pelvic free fluid noted within posterior cul-de-sac. The patient is status post hysterectomy. The urinary bladder is under distended.  Kidneys are symmetrical in size and enhancement. No hydronephrosis or hydroureter.  Mild hepatic fatty infiltration.  IMPRESSION: 1. There is mild hepatic fatty infiltration. 2. No acute inflammatory process within abdomen or pelvis. 3. No pericecal inflammation. Normal appendix. 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 4. Small amount of free fluid noted within posterior pelvis. Status post hysterectomy. 5. Moderate gas noted sigmoid colon.  No colitis or diverticulitis. 6. Status post appendectomy.  Status post gastric bypass surgery.   Electronically Signed   By: Natasha Mead M.D.   On: 11/25/2014 15:05   Dg Abd Acute W/chest  11/28/2014   CLINICAL DATA:  Right-sided abdominal pain radiating to the back, onset 4 days ago. Generalized body  aches, diaphoresis, chills, vomiting, headache, lightheadedness, dizziness,  generalized weakness, anorexia.  EXAM: DG ABDOMEN ACUTE W/ 1V CHEST  COMPARISON:  CT abdomen and pelvis 11/25/2014.  Chest 06/15/2014.  FINDINGS: Normal heart size and pulmonary vascularity. No focal airspace disease or consolidation in the lungs. No blunting of costophrenic angles. No pneumothorax. Mediastinal contours appear intact.  Scattered gas and stool in the colon. No small or large bowel distention. No free intra-abdominal air. No abnormal air-fluid levels. No radiopaque stones. Visualized bones appear intact. Surgical clips in the right upper quadrant.  IMPRESSION: No evidence of active pulmonary disease. Normal nonobstructive bowel gas pattern.   Electronically Signed   By: Burman Nieves M.D.   On: 11/28/2014 02:31   US Abdomen Limited Ruq  11/28/2014   CLINICAL DATA:  Abdominal pain  EXAM: US ABDOMEN LIMITED - RIGHT UPPER QUADRANT  COMPARISON:  11/25/2014  FINDINGS: Gallbladder:  Previous cholecystectomy.  Common bile duct:  Diameter: 8 mm.  Liver:  No focal lesion identified. Within normal limits in parenchymal echogenicity.  IMPRESSION: 1. No acute findings. 2. Prior cholecystectomy.   Electronically Signed   By: Signa Kell M.D.   On: 11/28/2014 17:18         Subjective:  patient complains of epigastric and right upper quadrant pain. She has had dry heaves which she states somewhat improved her pain transiently. Denies any fevers, chills, chest pain, shortness breath, diarrhea, hematochezia, melena.  Objective: Filed Vitals:   11/28/14 0706 11/28/14 1421 11/28/14 2117 11/29/14 0531  BP: 150/82 121/72 136/87 142/90  Pulse: 55 62 55 49  Temp: 97.3 F (36.3 C) 98.5 F (36.9 C) 98.5 F (36.9 C) 98.5 F (36.9 C)  TempSrc: Oral Oral Oral Oral  Resp: 20 20 18 18   Height: 6' (1.829 m)     Weight: 105 kg (231 lb 7.7 oz)     SpO2: 100% 94% 97% 95%    Intake/Output Summary (Last 24 hours) at 11/29/14  0926 Last data filed at 11/29/14 0600  Gross per 24 hour  Intake 1566.25 ml  Output    200 ml  Net 1366.25 ml   Weight change:  Exam:   General:  Pt is alert, follows commands appropriately, not in acute distress  HEENT: No icterus, No thrush, No neck mass, [place]/AT  Cardiovascular: RRR, S1/S2, no rubs, no gallops  Respiratory: CTA bilaterally, no wheezing, no crackles, no rhonchi  Abdomen: Soft/+BS, epigastric pain without any rebound.  non distended, no guarding; no hepatosplenomegaly   Extremities: No edema, No lymphangitis, No petechiae, No rashes, no synovitis; no cyanosis or clubbing   Data Reviewed: Basic Metabolic Panel:  Recent Labs Lab 11/25/14 1350 11/28/14 0030 11/29/14 0615  NA 141 139 140  K 3.5 3.1* 4.2  CL 106 104 109  CO2 28 28 26   GLUCOSE 89 93 75  BUN 11 10 <5*  CREATININE 0.80 0.83 0.69  CALCIUM 8.6* 8.6* 8.2*   Liver Function Tests:  Recent Labs Lab 11/25/14 1350 11/28/14 0030  AST 19 23  ALT 14 35  ALKPHOS 68 80  BILITOT 0.5 0.6  PROT 6.7 7.1  ALBUMIN 3.4* 3.9    Recent Labs Lab 11/25/14 1350 11/28/14 0030 11/29/14 0615  LIPASE 68* 71* 48   No results for input(s): AMMONIA in the last 168 hours. CBC:  Recent Labs Lab 11/25/14 1350 11/28/14 0030 11/29/14 0800  WBC 6.4 8.3 3.6*  NEUTROABS 3.4 3.8 1.6*  HGB 14.8 15.3* 13.2  HCT 44.1 45.0 40.7  MCV 95.0 95.1 97.6  PLT 285 307  218   Cardiac Enzymes:  Recent Labs Lab 11/25/14 1350  TROPONINI <0.03   BNP: Invalid input(s): POCBNP CBG: No results for input(s): GLUCAP in the last 168 hours.  No results found for this or any previous visit (from the past 240 hour(s)).   Scheduled Meds: . enoxaparin (LOVENOX) injection  40 mg Subcutaneous Daily   Continuous Infusions: . 0.9 % NaCl with KCl 20 mEq / L 75 mL/hr at 11/28/14 2338     Ertha Nabor, DO  [facility] Pager [phone]  If 7PM-7AM, please contact night-coverage [email or web address] Password  [email or web address] 11/29/2014, 9:26 AM   LOS: 1 day

## 2014-11-30 ENCOUNTER — Encounter (HOSPITAL_COMMUNITY)
Admission: EM | Disposition: A | Discharge status: Home / Self Care | Payer: Self-pay | Source: Home / Self Care | Attending: Internal Medicine

## 2014-11-30 ENCOUNTER — Encounter: Payer: Self-pay | Admitting: Gastroenterology

## 2014-11-30 ENCOUNTER — Encounter (HOSPITAL_COMMUNITY): Payer: Self-pay | Admitting: *Deleted

## 2014-11-30 DIAGNOSIS — R1013 Epigastric pain: Secondary | ICD-10-CM | POA: Insufficient documentation

## 2014-11-30 HISTORY — PX: ESOPHAGOGASTRODUODENOSCOPY: SHX5428

## 2014-11-30 LAB — BASIC METABOLIC PANEL
ANION GAP: 7 (ref 5–15)
BUN: <5 mg/dL — ABNORMAL LOW (ref 6–20)
CO2: 25 mmol/L (ref 22–32)
Calcium: 8.3 mg/dL — ABNORMAL LOW (ref 8.9–10.3)
Chloride: 108 mmol/L (ref 101–111)
Creatinine, Ser: 0.66 mg/dL (ref 0.44–1.00)
GFR calc Af Amer: >60 mL/min (ref >60)
GFR calc non Af Amer: >60 mL/min (ref >60)
GLUCOSE: 81 mg/dL (ref 65–99)
Potassium: 3.7 mmol/L (ref 3.5–5.1)
SODIUM: 140 mmol/L (ref 135–145)

## 2014-11-30 LAB — CBC
HEMATOCRIT: 41.1 % (ref 36.0–46.0)
Hemoglobin: 13.7 g/dL (ref 12.0–15.0)
MCH: 31.9 pg (ref 26.0–34.0)
MCHC: 33.3 g/dL (ref 30.0–36.0)
MCV: 95.6 fL (ref 78.0–100.0)
PLATELETS: 219 10*3/uL (ref 150–400)
RBC: 4.3 MIL/uL (ref 3.87–5.11)
RDW: 13 % (ref 11.5–15.5)
WBC: 4.3 10*3/uL (ref 4.0–10.5)

## 2014-11-30 LAB — URINE CULTURE

## 2014-11-30 LAB — HIV ANTIBODY (ROUTINE TESTING W REFLEX): HIV Screen 4th Generation wRfx: NONREACTIVE

## 2014-11-30 LAB — MAGNESIUM: MAGNESIUM: 1.8 mg/dL (ref 1.7–2.4)

## 2014-11-30 LAB — CK: Total CK: 58 U/L (ref 38–234)

## 2014-11-30 SURGERY — EGD (ESOPHAGOGASTRODUODENOSCOPY)
Anesthesia: Moderate Sedation

## 2014-11-30 MED ORDER — MIDAZOLAM HCL 10 MG/2ML IJ SOLN
INTRAMUSCULAR | Status: DC | PRN
  Administered 2014-11-30 (×3): 2 mg via INTRAVENOUS
  Administered 2014-11-30: 1 mg via INTRAVENOUS

## 2014-11-30 MED ORDER — MIDAZOLAM HCL 5 MG/ML IJ SOLN
INTRAMUSCULAR | Status: AC
  Filled 2014-11-30: qty 2

## 2014-11-30 MED ORDER — SODIUM CHLORIDE 0.9 % IV SOLN: INTRAVENOUS | Status: DC

## 2014-11-30 MED ORDER — BUTAMBEN-TETRACAINE-BENZOCAINE 2-2-14 % EX AERO
INHALATION_SPRAY | CUTANEOUS | Status: DC | PRN
  Administered 2014-11-30: 2 via TOPICAL

## 2014-11-30 MED ORDER — PANTOPRAZOLE SODIUM 40 MG PO TBEC: 40.0000 mg | DELAYED_RELEASE_TABLET | Freq: Every day | ORAL | Status: DC

## 2014-11-30 MED ORDER — DIPHENHYDRAMINE HCL 50 MG/ML IJ SOLN
INTRAMUSCULAR | Status: AC
  Filled 2014-11-30: qty 1

## 2014-11-30 MED ORDER — SUCRALFATE 1 GM/10ML PO SUSP
1.0000 g | Freq: Three times a day (TID) | ORAL | Status: DC
  Administered 2014-11-30: 1 g via ORAL
  Filled 2014-11-30: qty 10

## 2014-11-30 MED ORDER — SUCRALFATE 1 GM/10ML PO SUSP: 1.0000 g | Freq: Three times a day (TID) | ORAL | Status: DC

## 2014-11-30 MED ORDER — HYDRALAZINE HCL 20 MG/ML IJ SOLN
10.0000 mg | Freq: Four times a day (QID) | INTRAMUSCULAR | Status: DC | PRN
  Administered 2014-11-30: 10 mg via INTRAVENOUS
  Filled 2014-11-30: qty 1

## 2014-11-30 MED ORDER — FENTANYL CITRATE (PF) 100 MCG/2ML IJ SOLN
INTRAMUSCULAR | Status: DC | PRN
  Administered 2014-11-30 (×3): 25 ug via INTRAVENOUS

## 2014-11-30 MED ORDER — FENTANYL CITRATE (PF) 100 MCG/2ML IJ SOLN
INTRAMUSCULAR | Status: AC
  Filled 2014-11-30: qty 2

## 2014-11-30 NOTE — Discharge Summary (Signed)
[name], is a 39 y.o. female  DOB 16-May-1976  MRN [id number].  Admission date:  11/27/2014  Admitting Physician  Toy Baker, MD  Discharge Date:  11/30/2014   Primary MD  Philis Fendt, MD  Recommendations for primary care physician for things to follow:   Follow with GI in 1 week   Admission Diagnosis  Hypokalemia [E87.6] Intractable abdominal pain [R10.9]   Discharge Diagnosis  Hypokalemia [E87.6] Intractable abdominal pain [R10.9]     Active Problems:   Intractable abdominal pain   Hypokalemia   HTN (hypertension)   Nausea and vomiting   Abdominal pain, epigastric      Past Medical History  Diagnosis Date  . Anemia 2015    "pernicious" and iron deficiency.  as of 2015/2016 no longer requiring po iron or B12 shots.   . Hypertension   . Headache(784.0)     migraines  . Nausea & vomiting 11/28/2014  . Hydradenitis 2001    axillary, multiple surgical I & Ds/excisions.  . Obesity (BMI 30.0-34.9)   . Chondromalacia 2005    left knee. arthroscopy x 2.   . Adnexal cyst 2013    right  . Pityriasis rosea     pt also gives hx of seborrheic dermatitis.     Past Surgical History  Procedure Laterality Date  . Partial hysterectomy    . Laparoscopic cholecystectomy  2002  . Ovarian cyst removal    . R arm surgery    . Gastric bypass  2011    gastric sleeve  . Knee surgery  2005    left  . Cystectomy      2004 axilla bil  . Abdominal hysterectomy    . Medial patellofemoral ligament repair Left 06/08/2013    Procedure: DIAGNOSTIC OPERATIVE ARTHROSCOPY, OPEN MEDIAL PATELLA FEMORAL LIGAMENT RECONSTRUCTION;  Surgeon: Meredith Pel, MD;  Location: [place];  Service: Orthopedics;  Laterality: Left;  with Hamstring autograft  . Fracture surgery      rt arm       HPI  from  the history and physical done on the day of admission:     [name] is a 39 y.o. female with PHMx of gastric bypass in [place], HTN. She was seen in the ER with abdominal pain on 7/8 and given bactrim for possible UTI. As well as percocet and zofran. She presented back to the ER on 7/10 with unrelieved abdominal pain and nausea. She also developed body aches, dizziness with the pain, and chills. She stated, she feels like she has the flu. Patient had a CT Scan on 7/8 that found no issues with her gastric sleeve nor source of her pain. He lipase has increased mildly from 68 to 71. She also has had soft stools (not diarrhea) with "white" in it.   She describes her pain as sharp and radiates through to her back. Mother has h/o pancreatitis that the patient thought was due to mother's lupus. When patient has pain it makes her "lightheaded" and see  stars      Hospital Course:     1. Abd.  pain in a patient with previous gastric sleeve surgery done in [place] several years ago - CT scan done recently and ultrasound unremarkable, EGD suggestive of gastritis and possible duodenitis. Placed on PPI and Carafate. Requested to stop taking NSAIDs. Will be discharged home on PPI twice a day along with Carafate, close follow-up with GI, needs to follow up on biopsy and H. pylori results. No she has had no diarrhea in the last 48 hours. Last bowel movement was yesterday morning and it was formed.   2. Hypokalemia. Has been replaced.   3. Hypertension. Continue home regimen.     Discharge Condition: Stable  Follow UP  Follow-up Information    Follow up with AVBUERE,EDWIN A, MD. Schedule an appointment as soon as possible for a visit on 12/12/2014.   Specialty:  Internal Medicine   Why:  Appointment with Dr. Jeanie Cooks July 25th at 3:15   Contact information:   [place] [address] [phone]       Follow up with PYRTLE, Lajuan Lines, MD. Schedule an appointment as  soon as possible for a visit in 1 week.   Specialty:  Gastroenterology   Why:  Abd Pain/ Appointment is going to be with Dr. Hilarie Fredrickson PA Janett Billow on July 27th at 9:30am   Contact information:   [address] [place] [address] [phone]       Follow up with Pedro Earls, MD. Schedule an appointment as soon as possible for a visit in 1 week.   Specialty:  General Surgery   Why:  Gastric Bypass with Abd pain   Contact information:   [address] [place] [address] [phone]        Consults obtained - GI  Diet and Activity recommendation: See Discharge Instructions below  Discharge Instructions          Discharge Instructions    Diet - low sodium heart healthy    Complete by:  As directed      Discharge instructions    Complete by:  As directed   Follow with Primary MD Nolene Ebbs A, MD in 7 days   Get CBC, CMP, 2 view Chest X ray checked  by Primary MD next visit.    Activity: As tolerated with Full fall precautions use walker/cane & assistance as needed   Disposition Home    Diet: Heart Healthy    For Heart failure patients - Check your Weight same time everyday, if you gain over 2 pounds, or you develop in leg swelling, experience more shortness of breath or chest pain, call your Primary MD immediately. Follow Cardiac Low Salt Diet and 1.5 lit/day fluid restriction.   On your next visit with your primary care physician please Get Medicines reviewed and adjusted.   Please request your Prim.MD to go over all Hospital Tests and Procedure/Radiological results at the follow up, please get all Hospital records sent to your Prim MD by signing hospital release before you go home.   If you experience worsening of your admission symptoms, develop shortness of breath, life threatening emergency, suicidal or homicidal thoughts you must seek medical attention immediately by calling 911 or calling your MD immediately  if symptoms less  severe.  You Must read complete instructions/literature along with all the possible adverse reactions/side effects for all the Medicines you take and that have been prescribed to you. Take any new Medicines  after you have completely understood and accpet all the possible adverse reactions/side effects.   Do not drive, operating heavy machinery, perform activities at heights, swimming or participation in water activities or provide baby sitting services if your were admitted for syncope or siezures until you have seen by Primary MD or a Neurologist and advised to do so again.  Do not drive when taking Pain medications.    Do not take more than prescribed Pain, Sleep and Anxiety Medications  Special Instructions: If you have smoked or chewed Tobacco  in the last 2 yrs please stop smoking, stop any regular Alcohol  and or any Recreational drug use.  Wear Seat belts while driving.   Please note  You were cared for by a hospitalist during your hospital stay. If you have any questions about your discharge medications or the care you received while you were in the hospital after you are discharged, you can call the unit and asked to speak with the hospitalist on call if the hospitalist that took care of you is not available. Once you are discharged, your primary care physician will handle any further medical issues. Please note that NO REFILLS for any discharge medications will be authorized once you are discharged, as it is imperative that you return to your primary care physician (or establish a relationship with a primary care physician if you do not have one) for your aftercare needs so that they can reassess your need for medications and monitor your lab values.     Increase activity slowly    Complete by:  As directed              Discharge Medications       Medication List    STOP taking these medications        celecoxib 100 MG capsule  Commonly known as:  CELEBREX      ibuprofen 200 MG tablet  Commonly known as:  ADVIL,MOTRIN      TAKE these medications        acetaminophen 500 MG tablet  Commonly known as:  TYLENOL  Take 1,000 mg by mouth every 8 (eight) hours as needed for mild pain or headache.     albuterol 108 (90 BASE) MCG/ACT inhaler  Commonly known as:  PROVENTIL HFA;VENTOLIN HFA  Inhale 2 puffs into the lungs every 6 (six) hours as needed (chronic bronchitis). ProAir     amLODipine 10 MG tablet  Commonly known as:  NORVASC  Take 10 mg by mouth at bedtime.     aspirin-acetaminophen-caffeine 254-270-62 MG per tablet  Commonly known as:  EXCEDRIN MIGRAINE  Take 2 tablets by mouth 3 (three) times daily as needed for headache.     cyclobenzaprine 10 MG tablet  Commonly known as:  FLEXERIL  Take 10 mg by mouth 3 (three) times daily as needed for muscle spasms (back pain).     gabapentin 300 MG capsule  Commonly known as:  NEURONTIN  Take 300 mg by mouth 3 (three) times daily as needed (back pain). Max 4 capsules daily     ondansetron 4 MG disintegrating tablet  Commonly known as:  ZOFRAN ODT  Take 1 tablet (4 mg total) by mouth every 8 (eight) hours as needed for nausea.     Oxycodone HCl 20 MG Tabs  Take 20 mg by mouth 3 (three) times daily as needed (severe pain).     oxyCODONE-acetaminophen 5-325 MG per tablet  Commonly known as:  PERCOCET/ROXICET  Take 1  tablet by mouth every 4 (four) hours as needed.     pantoprazole 40 MG tablet  Commonly known as:  PROTONIX  Take 1 tablet (40 mg total) by mouth daily.     POTASSIUM PO  Take 1 tablet by mouth daily as needed (leg cramps).     sucralfate 1 GM/10ML suspension  Commonly known as:  CARAFATE  Take 10 mLs (1 g total) by mouth 4 (four) times daily -  with meals and at bedtime.     sulfamethoxazole-trimethoprim 800-160 MG per tablet  Commonly known as:  BACTRIM DS,SEPTRA DS  Take 1 tablet by mouth 2 (two) times daily.     VITAMIN B-12 SL  Place 1 tablet under the tongue  daily as needed (leg cramps).        Major procedures and Radiology Reports - PLEASE review detailed and final reports for all details, in brief -   EGD   ENDOSCOPIC IMPRESSION: 1. The mucosa of the esophagus appeared normal 2. Evidence of prior gastric sleeve surgery 3. Acute gastritis (inflammation) in the stomach; multiple biopsies were performed 4. Duodenal inflammation was found in the duodenal bulb 5. The duodenal mucosa showed no abnormalities in the 2nd part of the duodenum  RECOMMENDATIONS: 1. Daily PPI 2. Await biopsy reports, treat H. pylori if positive 3. Add sucralfate 1 g before meals and at bedtime to help with gastritis and epigastric pain  eSigned: Jerene Bears, MD 11/30/2014 3:27 PM   Ct Abdomen Pelvis W Contrast  11/25/2014   CLINICAL DATA:  Diffuse abdominal pain for 1 day  EXAM: CT ABDOMEN AND PELVIS WITH CONTRAST  TECHNIQUE: Multidetector CT imaging of the abdomen and pelvis was performed using the standard protocol following bolus administration of intravenous contrast.  CONTRAST:  4mL OMNIPAQUE IOHEXOL 300 MG/ML SOLN, 161mL OMNIPAQUE IOHEXOL 300 MG/ML SOLN  COMPARISON:  12/04/2011  FINDINGS: Lung bases are unremarkable. Sagittal images of the spine shows mild degenerative changes lumbar spine. Status postcholecystectomy. Enhanced liver is stable. Tiny subcapsular hyperdense liver lesion anterior aspect of right hepatic lobe measures 6 mm stable in size in appearance from prior exam. CBD measures 8 mm in diameter. The pancreas, spleen and adrenal glands are unremarkable. Abdominal aorta is unremarkable.  No small bowel obstruction.  No ascites or free air.  No adenopathy.  Again noted status post gastric bypass surgery. There is no pericecal inflammation. Normal appendix. There is small fecal like material within terminal ileum probable incompetent ileocecal valve. No thickened or dilated small bowel loops are noted. Moderate gaseous distension of  sigmoid colon. Some gas noted within rectum. There is no colitis or diverticulitis. Small pelvic free fluid noted within posterior cul-de-sac. The patient is status post hysterectomy. The urinary bladder is under distended.  Kidneys are symmetrical in size and enhancement. No hydronephrosis or hydroureter.  Mild hepatic fatty infiltration.  IMPRESSION: 1. There is mild hepatic fatty infiltration. 2. No acute inflammatory process within abdomen or pelvis. 3. No pericecal inflammation. Normal appendix. 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 4. Small amount of free fluid noted within posterior pelvis. Status post hysterectomy. 5. Moderate gas noted sigmoid colon.  No colitis or diverticulitis. 6. Status post appendectomy.  Status post gastric bypass surgery.   Electronically Signed   By: Lahoma Crocker M.D.   On: 11/25/2014 15:05   Dg Abd Acute W/chest  11/28/2014   CLINICAL DATA:  Right-sided abdominal pain radiating to the back, onset 4 days ago. Generalized body aches, diaphoresis, chills, vomiting, headache, lightheadedness,  dizziness, generalized weakness, anorexia.  EXAM: DG ABDOMEN ACUTE W/ 1V CHEST  COMPARISON:  CT abdomen and pelvis 11/25/2014.  Chest 06/15/2014.  FINDINGS: Normal heart size and pulmonary vascularity. No focal airspace disease or consolidation in the lungs. No blunting of costophrenic angles. No pneumothorax. Mediastinal contours appear intact.  Scattered gas and stool in the colon. No small or large bowel distention. No free intra-abdominal air. No abnormal air-fluid levels. No radiopaque stones. Visualized bones appear intact. Surgical clips in the right upper quadrant.  IMPRESSION: No evidence of active pulmonary disease. Normal nonobstructive bowel gas pattern.   Electronically Signed   By: Lucienne Capers M.D.   On: 11/28/2014 02:31   US Abdomen Limited Ruq  11/28/2014   CLINICAL DATA:  Abdominal pain  EXAM: US ABDOMEN LIMITED - RIGHT UPPER QUADRANT  COMPARISON:  11/25/2014  FINDINGS:  Gallbladder:  Previous cholecystectomy.  Common bile duct:  Diameter: 8 mm.  Liver:  No focal lesion identified. Within normal limits in parenchymal echogenicity.  IMPRESSION: 1. No acute findings. 2. Prior cholecystectomy.   Electronically Signed   By: Kerby Moors M.D.   On: 11/28/2014 17:18    Micro Results      Recent Results (from the past 240 hour(s))  Urine culture     Status: None   Collection Time: 11/28/14 12:01 AM  Result Value Ref Range Status   Specimen Description URINE, CLEAN CATCH  Final   Special Requests NONE  Final   Culture   Final    MULTIPLE SPECIES PRESENT, SUGGEST RECOLLECTION IF CLINICALLY INDICATED Performed at [facility]    Report Status 11/30/2014 FINAL  Final       Today   Subjective    [name] today has no headache,no chest abdominal pain,no new weakness tingling or numbness, feels much better wants to go home today.    Objective   Blood pressure 193/96, pulse 55, temperature 97.2 F (36.2 C), temperature source Oral, resp. rate 15, height 6' (1.829 m), weight 105 kg (231 lb 7.7 oz), SpO2 97 %.   Intake/Output Summary (Last 24 hours) at 11/30/14 1546 Last data filed at 11/30/14 0915  Gross per 24 hour  Intake 1883.75 ml  Output   2200 ml  Net -316.25 ml    Exam Awake Alert, Oriented x 3, No new F.N deficits, Normal affect [place].AT,PERRAL Supple Neck,No JVD, No cervical lymphadenopathy appriciated.  Symmetrical Chest wall movement, Good air movement bilaterally, CTAB RRR,No Gallops,Rubs or new Murmurs, No Parasternal Heave +ve B.Sounds, Abd Soft, Non tender, No organomegaly appriciated, No rebound -guarding or rigidity. No Cyanosis, Clubbing or edema, No new Rash or bruise   Data Review   CBC w Diff:  Lab Results  Component Value Date   WBC 4.3 11/30/2014   HGB 13.7 11/30/2014   HCT 41.1 11/30/2014   PLT 219 11/30/2014   LYMPHOPCT 45 11/29/2014   MONOPCT 8 11/29/2014   EOSPCT 3 11/29/2014   BASOPCT 0  11/29/2014    CMP:  Lab Results  Component Value Date   NA 140 11/30/2014   K 3.7 11/30/2014   CL 108 11/30/2014   CO2 25 11/30/2014   BUN <5* 11/30/2014   CREATININE 0.66 11/30/2014   PROT 7.1 11/28/2014   ALBUMIN 3.9 11/28/2014   BILITOT 0.6 11/28/2014   ALKPHOS 80 11/28/2014   AST 23 11/28/2014   ALT 35 11/28/2014  .   Total Time in preparing paper work, data evaluation and todays exam - 35 minutes  Thurnell Lose M.D on 11/30/2014 at 3:46 PM  [facility]   Office  [phone]

## 2014-11-30 NOTE — Discharge Instructions (Signed)
Follow with Primary MD Nolene Ebbs A, MD in 7 days   Get CBC, CMP, 2 view Chest X ray checked  by Primary MD next visit.    Activity: As tolerated with Full fall precautions use walker/cane & assistance as needed   Disposition Home    Diet: Heart Healthy    For Heart failure patients - Check your Weight same time everyday, if you gain over 2 pounds, or you develop in leg swelling, experience more shortness of breath or chest pain, call your Primary MD immediately. Follow Cardiac Low Salt Diet and 1.5 lit/day fluid restriction.   On your next visit with your primary care physician please Get Medicines reviewed and adjusted.   Please request your Prim.MD to go over all Hospital Tests and Procedure/Radiological results at the follow up, please get all Hospital records sent to your Prim MD by signing hospital release before you go home.   If you experience worsening of your admission symptoms, develop shortness of breath, life threatening emergency, suicidal or homicidal thoughts you must seek medical attention immediately by calling 911 or calling your MD immediately  if symptoms less severe.  You Must read complete instructions/literature along with all the possible adverse reactions/side effects for all the Medicines you take and that have been prescribed to you. Take any new Medicines after you have completely understood and accpet all the possible adverse reactions/side effects.   Do not drive, operating heavy machinery, perform activities at heights, swimming or participation in water activities or provide baby sitting services if your were admitted for syncope or siezures until you have seen by Primary MD or a Neurologist and advised to do so again.  Do not drive when taking Pain medications.    Do not take more than prescribed Pain, Sleep and Anxiety Medications  Special Instructions: If you have smoked or chewed Tobacco  in the last 2 yrs please stop smoking, stop any regular  Alcohol  and or any Recreational drug use.  Wear Seat belts while driving.   Please note  You were cared for by a hospitalist during your hospital stay. If you have any questions about your discharge medications or the care you received while you were in the hospital after you are discharged, you can call the unit and asked to speak with the hospitalist on call if the hospitalist that took care of you is not available. Once you are discharged, your primary care physician will handle any further medical issues. Please note that NO REFILLS for any discharge medications will be authorized once you are discharged, as it is imperative that you return to your primary care physician (or establish a relationship with a primary care physician if you do not have one) for your aftercare needs so that they can reassess your need for medications and monitor your lab values. Esophagogastroduodenoscopy Care After Refer to this sheet in the next few weeks. These instructions provide you with information on caring for yourself after your procedure. Your caregiver may also give you more specific instructions. Your treatment has been planned according to current medical practices, but problems sometimes occur. Call your caregiver if you have any problems or questions after your procedure.  HOME CARE INSTRUCTIONS  Do not eat or drink anything until the numbing medicine (local anesthetic) has worn off and your gag reflex has returned. You will know that the local anesthetic has worn off when you can swallow comfortably.  Do not drive for 12 hours after the procedure or as  directed by your caregiver.  Only take medicines as directed by your caregiver. SEEK MEDICAL CARE IF:   You cannot stop coughing.  You are not urinating at all or less than usual. SEEK IMMEDIATE MEDICAL CARE IF:  You have difficulty swallowing.  You cannot eat or drink.  You have worsening throat or chest pain.  You have dizziness,  lightheadedness, or you faint.  You have nausea or vomiting.  You have chills.  You have a fever.  You have severe abdominal pain.  You have black, tarry, or bloody stools. Document Released: 04/22/2012 Document Reviewed: 04/22/2012 [facility] Patient Information 2015 [place]. This information is not intended to replace advice given to you by your health care provider. Make sure you discuss any questions you have with your health care provider.  Will be on acid reduction medications to help stomach irritation heal; "early" ulcers; stomach lining looked inflamed. Biopsies taken of stomach lining and will be sent to check for H. Pylori, a bacteria that can cause stomach ulcers. You should follow up with Dr. Hilarie Fredrickson in about 1 month. No evidence of cancer.

## 2014-11-30 NOTE — Op Note (Signed)
[facility] [place], [id number]   ENDOSCOPY PROCEDURE REPORT  PATIENT: Erin, [name]  MR#: [id number] BIRTHDATE: 30-Jan-1976 , 39  yrs. old GENDER: female ENDOSCOPIST: Jerene Bears, MD REFERRED BY:  [facility] PROCEDURE DATE:  11/30/2014 PROCEDURE:  EGD, diagnostic and EGD w/ biopsy ASA CLASS:     Class II INDICATIONS:  epigastric pain and right upper quadrant pain, nausea, history of gastric sleeve. MEDICATIONS: Fentanyl 75 mcg IV and Versed 7 mg IV TOPICAL ANESTHETIC: Cetacaine Spray  DESCRIPTION OF PROCEDURE: After the risks benefits and alternatives of the procedure were thoroughly explained, informed consent was obtained.  The endoscope [id number] endoscope was introduced through the mouth and advanced to the second portion of the duodenum , Without limitations.  The instrument was slowly withdrawn as the mucosa was fully examined.   ESOPHAGUS: The mucosa of the esophagus appeared normal.  STOMACH: Evidence of prior gastric surgery, consistent with gastric sleeve. The stomach, as expected, is somewhat tubular.Acute gastritis (inflammation) was found in the gastric body and gastric antrum.  There were scattered erosions in the gastric body along with moderate erythema in the gastric antrum.  Multiple biopsies were performed using cold forceps.   One area of nodular mucosa in the gastric antrum possibly secondary to granuloma type reaction from gastric surgery. No ulcer or adenomatous appearing tissue seen in this area. Multiple biopsies obtained at this site and placed in separate jar.  DUODENUM: Mild duodenal inflammation was found in the duodenal bulb. The duodenal mucosa showed no abnormalities in the 2nd part of the duodenum.  Retroflexed views revealed no abnormalities.     The scope was then withdrawn from the patient and the procedure completed.  COMPLICATIONS: There were no immediate  complications.  ENDOSCOPIC IMPRESSION: 1.   The mucosa of the esophagus appeared normal 2.   Evidence of prior gastric sleeve surgery 3.   Acute gastritis (inflammation) in the stomach; multiple biopsies were performed 4.   Duodenal inflammation was found in the duodenal bulb 5.   The duodenal mucosa showed no abnormalities in the 2nd part of the duodenum  RECOMMENDATIONS: 1.  Daily PPI 2.  Await biopsy reports, treat H.  pylori if positive 3.  Add sucralfate 1 g before meals and at bedtime to help with gastritis and epigastric pain  eSigned:  Jerene Bears, MD 11/30/2014 3:27 PM    CC: the patient  PATIENT NAME:  Erin, [name] MR#: [id number]

## 2014-11-30 NOTE — Progress Notes (Signed)
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 No loose bowel movements in the last 48 hours. Discontinuing isolation precautions. Will continue to monitor.

## 2014-11-30 NOTE — Plan of Care (Signed)
Problem: Phase III Progression Outcomes Goal: Discharge plan remains appropriate-arrangements made Outcome: Completed/Met Date Met:  11/30/14 Exit care note given gastritis

## 2014-11-30 NOTE — Plan of Care (Signed)
     [name] was admitted to the Hospital on 11/27/2014 and Discharged  11/30/2014 and should be excused from work/school   for 7 days starting 11/27/2014 , may return to work/school without any restrictions.  Call Lala Lund MD, [facility]  [phone] with questions.  Thurnell Lose M.D on 11/30/2014,at 3:57 PM  [facility]   Office  [phone]

## 2014-12-01 ENCOUNTER — Encounter (HOSPITAL_COMMUNITY): Payer: Self-pay | Admitting: Internal Medicine

## 2014-12-01 ENCOUNTER — Encounter: Payer: Self-pay | Admitting: Internal Medicine

## 2014-12-02 LAB — GI PATHOGEN PANEL BY PCR, STOOL
C difficile toxin A/B: NOT DETECTED
CAMPYLOBACTER BY PCR: NOT DETECTED
CRYPTOSPORIDIUM BY PCR: NOT DETECTED
E COLI (ETEC) LT/ST: NOT DETECTED
E COLI (STEC): NOT DETECTED
E coli 0157 by PCR: NOT DETECTED
G lamblia by PCR: NOT DETECTED
NOROVIRUS G1/G2: NOT DETECTED
Rotavirus A by PCR: NOT DETECTED
Salmonella by PCR: NOT DETECTED
Shigella by PCR: NOT DETECTED

## 2014-12-14 ENCOUNTER — Encounter: Payer: Self-pay | Admitting: Gastroenterology

## 2014-12-14 ENCOUNTER — Ambulatory Visit (INDEPENDENT_AMBULATORY_CARE_PROVIDER_SITE_OTHER): Payer: Medicaid Other | Admitting: Gastroenterology

## 2014-12-14 VITALS — BP 152/100 | HR 64 | Ht 72.0 in | Wt 223.4 lb

## 2014-12-14 DIAGNOSIS — R112 Nausea with vomiting, unspecified: Secondary | ICD-10-CM | POA: Diagnosis not present

## 2014-12-14 DIAGNOSIS — R109 Unspecified abdominal pain: Secondary | ICD-10-CM | POA: Diagnosis not present

## 2014-12-14 DIAGNOSIS — R748 Abnormal levels of other serum enzymes: Secondary | ICD-10-CM

## 2014-12-14 MED ORDER — DICYCLOMINE HCL 10 MG PO CAPS: 10.0000 mg | ORAL_CAPSULE | Freq: Two times a day (BID) | ORAL | Status: DC

## 2014-12-14 NOTE — Patient Instructions (Signed)
You have been scheduled for an MRI at [facility] on 12/20/2014. Your appointment time is 8:00am. Please arrive 15 minutes prior to your appointment time for registration purposes. Please make certain not to have anything to eat or drink 6 hours prior to your test. In addition, if you have any metal in your body, have a pacemaker or defibrillator, please be sure to let your ordering physician know. This test typically takes 45 minutes to 1 hour to complete.  We have sent medications to your pharmacy for you to pick up at your convenience.

## 2014-12-14 NOTE — Progress Notes (Addendum)
     12/14/2014 [name] [id number] 1975-11-05   History of Present Illness:  This is a 39 year old female who was recently seen by our practice, Dr. Hilarie Fredrickson, while hospitalized for complaints of nausea, vomiting, and abdominal pain.  See consult note from 11/29/2014 by Lupe Carney for more details.  Nonetheless, these symptoms have now been present for about the past 3 weeks.  Pain is in the RUQ/right mid-abdomen and radiates to her back.  Described as constant gnawing pain but becomes stabbing sensation at times.  EGD, CT scan, and abdominal ultrasound have all been unremarkable.  LFT's have been normal but lipase was minimally elevated on a couple of different occasions.  No improvement in symptoms with pantoprazole 40 mg daily and carafate suspension four times daily.  Current Medications, Allergies, Past Medical History, Past Surgical History, Family History and Social History were reviewed in [employer] record.   Physical Exam: BP 152/100 mmHg  Pulse 64  Ht 6' (1.829 m)  Wt 223 lb 6.4 oz (101.334 kg)  BMI 30.29 kg/m2 General: Well developed black female in no acute distress Head: Normocephalic and atraumatic Eyes:  Sclerae anicteric, conjunctiva pink  Ears: Normal auditory acuity Lungs: Clear throughout to auscultation Heart: Regular rate and rhythm Abdomen: Soft, non-distended.  Normal bowel sounds.  Diffuse TTP but > on the right side, no R/R/G. Musculoskeletal: Symmetrical with no gross deformities  Extremities: No edema  Neurological: Alert oriented x 4, grossly non-focal Psychological:  Alert and cooperative. Normal mood and affect  Assessment and Recommendations: -39 year old female with 3 weeks of right sided abdominal pain that radiates to her back.  LFT's have been normal, but lipase has been mildly elevated on a couple of different occasions.  CT scan, abdominal ultrasound, and EGD all unrevealing/unremarkable.  Will check MRI  abdomen/MRCP to be sure there is no evidence of retained stones, etc.  Will try Bentyl 20 mg BID in the interim.  If MRI/MRCP is unremarkable then may want to consider SBFT.  Will allow her to be out of work for the remainder of this week; to return on 8/1.  Addendum: Reviewed and agree with initial management. Jerene Bears, MD

## 2014-12-20 ENCOUNTER — Ambulatory Visit (HOSPITAL_COMMUNITY): Admission: RE | Admit: 2014-12-20 | Payer: Medicaid Other | Source: Ambulatory Visit

## 2014-12-20 ENCOUNTER — Other Ambulatory Visit: Payer: Self-pay

## 2014-12-20 DIAGNOSIS — R109 Unspecified abdominal pain: Secondary | ICD-10-CM

## 2014-12-20 DIAGNOSIS — R748 Abnormal levels of other serum enzymes: Secondary | ICD-10-CM

## 2014-12-20 DIAGNOSIS — R112 Nausea with vomiting, unspecified: Secondary | ICD-10-CM

## 2014-12-21 ENCOUNTER — Ambulatory Visit (HOSPITAL_COMMUNITY): Admission: RE | Admit: 2014-12-21 | Payer: Medicaid Other | Source: Ambulatory Visit

## 2014-12-21 ENCOUNTER — Ambulatory Visit (HOSPITAL_COMMUNITY)
Admission: RE | Admit: 2014-12-21 | Discharge: 2014-12-21 | Disposition: A | Discharge status: Home / Self Care | Payer: Medicaid Other | Source: Ambulatory Visit | Attending: Gastroenterology | Admitting: Gastroenterology

## 2014-12-21 ENCOUNTER — Other Ambulatory Visit: Payer: Self-pay | Admitting: Gastroenterology

## 2014-12-21 DIAGNOSIS — R109 Unspecified abdominal pain: Secondary | ICD-10-CM

## 2014-12-21 DIAGNOSIS — R748 Abnormal levels of other serum enzymes: Secondary | ICD-10-CM

## 2014-12-21 DIAGNOSIS — R112 Nausea with vomiting, unspecified: Secondary | ICD-10-CM

## 2014-12-21 DIAGNOSIS — Z9049 Acquired absence of other specified parts of digestive tract: Secondary | ICD-10-CM | POA: Diagnosis not present

## 2015-04-27 ENCOUNTER — Emergency Department (HOSPITAL_BASED_OUTPATIENT_CLINIC_OR_DEPARTMENT_OTHER)
Admission: EM | Admit: 2015-04-27 | Discharge: 2015-04-27 | Disposition: A | Discharge status: Home / Self Care | Payer: Medicaid Other | Attending: Emergency Medicine | Admitting: Emergency Medicine

## 2015-04-27 ENCOUNTER — Encounter (HOSPITAL_BASED_OUTPATIENT_CLINIC_OR_DEPARTMENT_OTHER): Payer: Self-pay

## 2015-04-27 ENCOUNTER — Emergency Department (HOSPITAL_BASED_OUTPATIENT_CLINIC_OR_DEPARTMENT_OTHER): Payer: Medicaid Other

## 2015-04-27 DIAGNOSIS — Z7952 Long term (current) use of systemic steroids: Secondary | ICD-10-CM | POA: Insufficient documentation

## 2015-04-27 DIAGNOSIS — F419 Anxiety disorder, unspecified: Secondary | ICD-10-CM | POA: Insufficient documentation

## 2015-04-27 DIAGNOSIS — Z79899 Other long term (current) drug therapy: Secondary | ICD-10-CM | POA: Insufficient documentation

## 2015-04-27 DIAGNOSIS — E669 Obesity, unspecified: Secondary | ICD-10-CM | POA: Diagnosis not present

## 2015-04-27 DIAGNOSIS — J40 Bronchitis, not specified as acute or chronic: Secondary | ICD-10-CM

## 2015-04-27 DIAGNOSIS — Z880 Allergy status to penicillin: Secondary | ICD-10-CM | POA: Diagnosis not present

## 2015-04-27 DIAGNOSIS — Z872 Personal history of diseases of the skin and subcutaneous tissue: Secondary | ICD-10-CM | POA: Diagnosis not present

## 2015-04-27 DIAGNOSIS — Z87448 Personal history of other diseases of urinary system: Secondary | ICD-10-CM | POA: Diagnosis not present

## 2015-04-27 DIAGNOSIS — R0602 Shortness of breath: Secondary | ICD-10-CM | POA: Diagnosis present

## 2015-04-27 DIAGNOSIS — Z862 Personal history of diseases of the blood and blood-forming organs and certain disorders involving the immune mechanism: Secondary | ICD-10-CM | POA: Diagnosis not present

## 2015-04-27 DIAGNOSIS — F17210 Nicotine dependence, cigarettes, uncomplicated: Secondary | ICD-10-CM | POA: Insufficient documentation

## 2015-04-27 DIAGNOSIS — I10 Essential (primary) hypertension: Secondary | ICD-10-CM | POA: Insufficient documentation

## 2015-04-27 MED ORDER — ALBUTEROL SULFATE (2.5 MG/3ML) 0.083% IN NEBU
5.0000 mg | INHALATION_SOLUTION | Freq: Once | RESPIRATORY_TRACT | Status: AC
  Administered 2015-04-27: 5 mg via RESPIRATORY_TRACT
  Filled 2015-04-27: qty 6

## 2015-04-27 MED ORDER — PREDNISONE 10 MG PO TABS: 20.0000 mg | ORAL_TABLET | Freq: Every day | ORAL | Status: DC

## 2015-04-27 MED ORDER — LORAZEPAM 1 MG PO TABS
1.0000 mg | ORAL_TABLET | Freq: Once | ORAL | Status: AC
  Administered 2015-04-27: 1 mg via ORAL
  Filled 2015-04-27: qty 1

## 2015-04-27 NOTE — Discharge Instructions (Signed)
Upper Respiratory Infection, Adult Most upper respiratory infections (URIs) are a viral infection of the air passages leading to the lungs. A URI affects the nose, throat, and upper air passages. The most common type of URI is nasopharyngitis and is typically referred to as "the common cold." URIs run their course and usually go away on their own. Most of the time, a URI does not require medical attention, but sometimes a bacterial infection in the upper airways can follow a viral infection. This is called a secondary infection. Sinus and middle ear infections are common types of secondary upper respiratory infections. Bacterial pneumonia can also complicate a URI. A URI can worsen asthma and chronic obstructive pulmonary disease (COPD). Sometimes, these complications can require emergency medical care and may be life threatening.  CAUSES Almost all URIs are caused by viruses. A virus is a type of germ and can spread from one person to another.  RISKS FACTORS You may be at risk for a URI if:   You smoke.   You have chronic heart or lung disease.  You have a weakened defense (immune) system.   You are very young or very old.   You have nasal allergies or asthma.  You work in crowded or poorly ventilated areas.  You work in health care facilities or schools. SIGNS AND SYMPTOMS  Symptoms typically develop 2-3 days after you come in contact with a cold virus. Most viral URIs last 7-10 days. However, viral URIs from the influenza virus (flu virus) can last 14-18 days and are typically more severe. Symptoms may include:   Runny or stuffy (congested) nose.   Sneezing.   Cough.   Sore throat.   Headache.   Fatigue.   Fever.   Loss of appetite.   Pain in your forehead, behind your eyes, and over your cheekbones (sinus pain).  Muscle aches.  DIAGNOSIS  Your health care provider may diagnose a URI by:  Physical exam.  Tests to check that your symptoms are not due to  another condition such as:  Strep throat.  Sinusitis.  Pneumonia.  Asthma. TREATMENT  A URI goes away on its own with time. It cannot be cured with medicines, but medicines may be prescribed or recommended to relieve symptoms. Medicines may help:  Reduce your fever.  Reduce your cough.  Relieve nasal congestion. HOME CARE INSTRUCTIONS   Take medicines only as directed by your health care provider.   Gargle warm saltwater or take cough drops to comfort your throat as directed by your health care provider.  Use a warm mist humidifier or inhale steam from a shower to increase air moisture. This may make it easier to breathe.  Drink enough fluid to keep your urine clear or pale yellow.   Eat soups and other clear broths and maintain good nutrition.   Rest as needed.   Return to work when your temperature has returned to normal or as your health care provider advises. You may need to stay home longer to avoid infecting others. You can also use a face mask and careful hand washing to prevent spread of the virus.  Increase the usage of your inhaler if you have asthma.   Do not use any tobacco products, including cigarettes, chewing tobacco, or electronic cigarettes. If you need help quitting, ask your health care provider. PREVENTION  The best way to protect yourself from getting a cold is to practice good hygiene.   Avoid oral or hand contact with people with cold  symptoms.   Wash your hands often if contact occurs.  There is no clear evidence that vitamin C, vitamin E, echinacea, or exercise reduces the chance of developing a cold. However, it is always recommended to get plenty of rest, exercise, and practice good nutrition.  SEEK MEDICAL CARE IF:   You are getting worse rather than better.   Your symptoms are not controlled by medicine.   You have chills.  You have worsening shortness of breath.  You have brown or red mucus.  You have yellow or brown nasal  discharge.  You have pain in your face, especially when you bend forward.  You have a fever.  You have swollen neck glands.  You have pain while swallowing.  You have white areas in the back of your throat. SEEK IMMEDIATE MEDICAL CARE IF:   You have severe or persistent:  Headache.  Ear pain.  Sinus pain.  Chest pain.  You have chronic lung disease and any of the following:  Wheezing.  Prolonged cough.  Coughing up blood.  A change in your usual mucus.  You have a stiff neck.  You have changes in your:  Vision.  Hearing.  Thinking.  Mood. MAKE SURE YOU:   Understand these instructions.  Will watch your condition.  Will get help right away if you are not doing well or get worse.   This information is not intended to replace advice given to you by your health care provider. Make sure you discuss any questions you have with your health care provider.  Follow up with your primary care provider for reevaluation. Continue using albuterol inhaler at home for shortness of breath and wheezing. Return to the emergency department if you ask parents worsening of her symptoms, fever, difficulty breathing or swallowing, chest pain.

## 2015-04-27 NOTE — ED Notes (Signed)
C/o "i;m feeling like i can't catch my breath like i'm having a panic attack"-NAD-used albuterol inhaler approx 5 min PTA

## 2015-04-30 NOTE — ED Provider Notes (Signed)
CSN: [id number]     Arrival date & time 04/27/15  1645 History   First MD Initiated Contact with Patient 04/27/15 1709     Chief Complaint  Patient presents with  . Shortness of Breath     (Consider location/radiation/quality/duration/timing/severity/associated sxs/prior Treatment) HPI  [name] is a 39 y.o F with no significant pmhx who presents to the ED with cough and SOB. Pt states that she was diagnosed with bronchitis 1 month ago. No abx were given. Pt has been using a home albuterol with minimal relief. Pt states that she feels that she has not improved symptomatically over the last month and she often feels like she "can't catch my breath as if i'm having a panic attack". Pt reports productive cough with clear sputum, congestion, rhinorrhea. Denies feer, chest pain, N/V/D, LE swelling, headache, vision changes, paresthesias.    Past Medical History  Diagnosis Date  . Anemia 2015    "pernicious" and iron deficiency.  as of 2015/2016 no longer requiring po iron or B12 shots.   . Hypertension   . Headache(784.0)     migraines  . Nausea & vomiting 11/28/2014  . Hydradenitis 2001    axillary, multiple surgical I & Ds/excisions.  . Obesity (BMI 30.0-34.9)   . Chondromalacia 2005    left knee. arthroscopy x 2.   . Adnexal cyst 2013    right  . Pityriasis rosea     pt also gives hx of seborrheic dermatitis.    Past Surgical History  Procedure Laterality Date  . Partial hysterectomy    . Laparoscopic cholecystectomy  2002  . Ovarian cyst removal    . R arm surgery    . Gastric bypass  2011    gastric sleeve  . Knee surgery  2005    left  . Cystectomy      2004 axilla bil  . Abdominal hysterectomy    . Medial patellofemoral ligament repair Left 06/08/2013    Procedure: DIAGNOSTIC OPERATIVE ARTHROSCOPY, OPEN MEDIAL PATELLA FEMORAL LIGAMENT RECONSTRUCTION;  Surgeon: Meredith Pel, MD;  Location: [place];  Service: Orthopedics;  Laterality: Left;  with Hamstring  autograft  . Fracture surgery      rt arm  . Esophagogastroduodenoscopy N/A 11/30/2014    Procedure: ESOPHAGOGASTRODUODENOSCOPY (EGD);  Surgeon: Jerene Bears, MD;  Location: [facility] ENDOSCOPY;  Service: Endoscopy;  Laterality: N/A;   Family History  Problem Relation Age of Onset  . Cancer Maternal Grandfather     lung  . Crohn's disease Father   . Lupus Mother    Social History  Substance Use Topics  . Smoking status: Current Every Day Smoker -- 0.50 packs/day for 10 years    Types: Cigarettes  . Smokeless tobacco: Never Used  . Alcohol Use: No   OB History    No data available     Review of Systems  All other systems reviewed and are negative.     Allergies  Penicillins  Home Medications   Prior to Admission medications   Medication Sig Start Date End Date Taking? Authorizing Provider  albuterol (PROVENTIL HFA;VENTOLIN HFA) 108 (90 BASE) MCG/ACT inhaler Inhale 2 puffs into the lungs every 6 (six) hours as needed (chronic bronchitis). ProAir    Historical Provider, MD  amLODipine (NORVASC) 10 MG tablet Take 10 mg by mouth at bedtime.     Historical Provider, MD  aspirin-acetaminophen-caffeine (EXCEDRIN MIGRAINE) [phone] MG per tablet Take 2 tablets by mouth 3 (three) times daily as needed for headache.  Historical Provider, MD  cyclobenzaprine (FLEXERIL) 10 MG tablet Take 10 mg by mouth 3 (three) times daily as needed for muscle spasms (back pain).     Historical Provider, MD  dicyclomine (BENTYL) 10 MG capsule Take 1 capsule (10 mg total) by mouth 2 (two) times daily. 12/14/14   Laban Emperor Zehr, PA-C  gabapentin (NEURONTIN) 300 MG capsule Take 300 mg by mouth 3 (three) times daily as needed (back pain). Max 4 capsules daily    Historical Provider, MD  Oxycodone HCl 20 MG TABS Take 20 mg by mouth 3 (three) times daily as needed (severe pain).  11/10/14   Historical Provider, MD  pantoprazole (PROTONIX) 40 MG tablet Take 1 tablet (40 mg total) by mouth daily. 11/30/14   Thurnell Lose, MD  predniSONE (DELTASONE) 10 MG tablet Take 2 tablets (20 mg total) by mouth daily. 04/27/15   Samantha Tripp Dowless, PA-C  sucralfate (CARAFATE) 1 GM/10ML suspension Take 10 mLs (1 g total) by mouth 4 (four) times daily -  with meals and at bedtime. 11/30/14   Thurnell Lose, MD  traZODone (DESYREL) 100 MG tablet TAKE 1 TABLET BY MOUTH DAILY AT BEDTIME FOR SLEEP 12/12/14   Historical Provider, MD   BP 135/90 mmHg  Pulse 84  Temp(Src) 98.3 F (36.8 C) (Oral)  Resp 16  SpO2 98% Physical Exam  Constitutional: She is oriented to person, place, and time. She appears well-developed and well-nourished. No distress.  HENT:  Head: Normocephalic and atraumatic.  Mouth/Throat: Oropharynx is clear and moist. No oropharyngeal exudate.  Eyes: Conjunctivae and EOM are normal. Pupils are equal, round, and reactive to light. Right eye exhibits no discharge. Left eye exhibits no discharge. No scleral icterus.  Neck: Neck supple. No JVD present.  Cardiovascular: Normal rate, regular rhythm, normal heart sounds and intact distal pulses.  Exam reveals no gallop and no friction rub.   No murmur heard. Pulmonary/Chest: Effort normal. No stridor. No respiratory distress. She has wheezes. She has rales. She exhibits no tenderness.  Abdominal: Soft. She exhibits no distension. There is no tenderness. There is no guarding.  Musculoskeletal: Normal range of motion. She exhibits no edema or tenderness.  Lymphadenopathy:    She has no cervical adenopathy.  Neurological: She is alert and oriented to person, place, and time. No cranial nerve deficit.  Strength 5/5 throughout. No sensory deficits.    Skin: Skin is warm and dry. No rash noted. She is not diaphoretic. No erythema. No pallor.  Psychiatric: She has a normal mood and affect. Her behavior is normal.  Nursing note and vitals reviewed.   ED Course  Procedures (including critical care time) Labs Review  Labs Reviewed - No data to  display  Imaging Review No results found. I have personally reviewed and evaluated these images and lab results as part of my medical decision-making.   EKG Interpretation None      MDM   Final diagnoses:  Bronchitis    Pt CXR negative for acute infiltrate. Patients symptoms are consistent with URI, likely viral etiology. Discussed that antibiotics are not indicated for viral infections. Due to duration of symptoms and wheezes on lung auscultation, will d/c home with steroid burst. Pt is PERC negative.CXR negative for acute infiltrate. EKG unremarkable. No hypoxia, no tachycardia.No LE edema. CXR and EKG would not upload into note. Pt also very anxious in room, given ativan for anxiety as well as nebulizer treatment. Pt reports symptomatic improvement after treatment. SOB improved.Verbalizes understanding and  is agreeable with plan. Pt is hemodynamically stable & in NAD prior to dc.     Dondra Spry [place], PA-C 04/30/15 [place], MD 05/10/15 [phone]

## 2015-05-15 ENCOUNTER — Emergency Department (HOSPITAL_BASED_OUTPATIENT_CLINIC_OR_DEPARTMENT_OTHER)
Admission: EM | Admit: 2015-05-15 | Discharge: 2015-05-16 | Disposition: A | Discharge status: Home / Self Care | Payer: Medicaid Other | Attending: Emergency Medicine | Admitting: Emergency Medicine

## 2015-05-15 ENCOUNTER — Encounter (HOSPITAL_BASED_OUTPATIENT_CLINIC_OR_DEPARTMENT_OTHER): Payer: Self-pay | Admitting: *Deleted

## 2015-05-15 DIAGNOSIS — Z872 Personal history of diseases of the skin and subcutaneous tissue: Secondary | ICD-10-CM | POA: Diagnosis not present

## 2015-05-15 DIAGNOSIS — F17210 Nicotine dependence, cigarettes, uncomplicated: Secondary | ICD-10-CM | POA: Diagnosis not present

## 2015-05-15 DIAGNOSIS — J209 Acute bronchitis, unspecified: Secondary | ICD-10-CM | POA: Insufficient documentation

## 2015-05-15 DIAGNOSIS — Z8739 Personal history of other diseases of the musculoskeletal system and connective tissue: Secondary | ICD-10-CM | POA: Diagnosis not present

## 2015-05-15 DIAGNOSIS — Z9884 Bariatric surgery status: Secondary | ICD-10-CM | POA: Insufficient documentation

## 2015-05-15 DIAGNOSIS — Z79899 Other long term (current) drug therapy: Secondary | ICD-10-CM | POA: Insufficient documentation

## 2015-05-15 DIAGNOSIS — Z8742 Personal history of other diseases of the female genital tract: Secondary | ICD-10-CM | POA: Insufficient documentation

## 2015-05-15 DIAGNOSIS — Z880 Allergy status to penicillin: Secondary | ICD-10-CM | POA: Diagnosis not present

## 2015-05-15 DIAGNOSIS — G43909 Migraine, unspecified, not intractable, without status migrainosus: Secondary | ICD-10-CM | POA: Diagnosis not present

## 2015-05-15 DIAGNOSIS — I10 Essential (primary) hypertension: Secondary | ICD-10-CM | POA: Insufficient documentation

## 2015-05-15 DIAGNOSIS — Z862 Personal history of diseases of the blood and blood-forming organs and certain disorders involving the immune mechanism: Secondary | ICD-10-CM | POA: Insufficient documentation

## 2015-05-15 DIAGNOSIS — R05 Cough: Secondary | ICD-10-CM | POA: Diagnosis present

## 2015-05-15 DIAGNOSIS — Z7952 Long term (current) use of systemic steroids: Secondary | ICD-10-CM | POA: Insufficient documentation

## 2015-05-15 NOTE — ED Notes (Signed)
Pt c/o flu like symptoms x 3 weeks seen 2 weeks ago for same

## 2015-05-16 MED ORDER — AZITHROMYCIN 250 MG PO TABS: ORAL_TABLET | ORAL | Status: DC

## 2015-05-16 NOTE — ED Provider Notes (Signed)
CSN: [id number]     Arrival date & time 05/15/15  2231 History  By signing my name below, I, Altamease Oiler, attest that this documentation has been prepared under the direction and in the presence of Veryl Speak, MD. Electronically Signed: Altamease Oiler, ED Scribe. 05/16/2015. 12:08 AM    Chief Complaint  Patient presents with  . URI    The history is provided by the patient. No language interpreter was used.   [name] is a 39 y.o. female with history of bronchitis who presents to the Emergency Department complaining of cough productive of green sputum with onset at least 2 weeks ago.  Associated symptoms include headache, right-sided facial pain, myalgias, and vomiting . She was seen in the ED on 04/27/15 for similar and prescribed Prednisone but states that her symptoms are worsening. She was exposed to a co-worker with pneumonia but denies other known sick contact. Pt is a smoker.   Past Medical History  Diagnosis Date  . Anemia 2015    "pernicious" and iron deficiency.  as of 2015/2016 no longer requiring po iron or B12 shots.   . Hypertension   . Headache(784.0)     migraines  . Nausea & vomiting 11/28/2014  . Hydradenitis 2001    axillary, multiple surgical I & Ds/excisions.  . Obesity (BMI 30.0-34.9)   . Chondromalacia 2005    left knee. arthroscopy x 2.   . Adnexal cyst 2013    right  . Pityriasis rosea     pt also gives hx of seborrheic dermatitis.    Past Surgical History  Procedure Laterality Date  . Partial hysterectomy    . Laparoscopic cholecystectomy  2002  . Ovarian cyst removal    . R arm surgery    . Gastric bypass  2011    gastric sleeve  . Knee surgery  2005    left  . Cystectomy      2004 axilla bil  . Abdominal hysterectomy    . Medial patellofemoral ligament repair Left 06/08/2013    Procedure: DIAGNOSTIC OPERATIVE ARTHROSCOPY, OPEN MEDIAL PATELLA FEMORAL LIGAMENT RECONSTRUCTION;  Surgeon: Meredith Pel, MD;  Location: [place];   Service: Orthopedics;  Laterality: Left;  with Hamstring autograft  . Fracture surgery      rt arm  . Esophagogastroduodenoscopy N/A 11/30/2014    Procedure: ESOPHAGOGASTRODUODENOSCOPY (EGD);  Surgeon: Jerene Bears, MD;  Location: [facility] ENDOSCOPY;  Service: Endoscopy;  Laterality: N/A;   Family History  Problem Relation Age of Onset  . Cancer Maternal Grandfather     lung  . Crohn's disease Father   . Lupus Mother    Social History  Substance Use Topics  . Smoking status: Current Every Day Smoker -- 0.50 packs/day for 10 years    Types: Cigarettes  . Smokeless tobacco: Never Used  . Alcohol Use: No   OB History    No data available     Review of Systems 10 Systems reviewed and all are negative for acute change except as noted in the HPI. Allergies  Penicillins  Home Medications   Prior to Admission medications   Medication Sig Start Date End Date Taking? Authorizing Provider  albuterol (PROVENTIL HFA;VENTOLIN HFA) 108 (90 BASE) MCG/ACT inhaler Inhale 2 puffs into the lungs every 6 (six) hours as needed (chronic bronchitis). ProAir    Historical Provider, MD  amLODipine (NORVASC) 10 MG tablet Take 10 mg by mouth at bedtime.     Historical Provider, MD  aspirin-acetaminophen-caffeine [facility]  MIGRAINE) 250-250-65 MG per tablet Take 2 tablets by mouth 3 (three) times daily as needed for headache.    Historical Provider, MD  azithromycin (ZITHROMAX Z-PAK) 250 MG tablet 2 po day one, then 1 daily x 4 days 05/16/15   Veryl Speak, MD  cyclobenzaprine (FLEXERIL) 10 MG tablet Take 10 mg by mouth 3 (three) times daily as needed for muscle spasms (back pain).     Historical Provider, MD  dicyclomine (BENTYL) 10 MG capsule Take 1 capsule (10 mg total) by mouth 2 (two) times daily. 12/14/14   Laban Emperor Zehr, PA-C  gabapentin (NEURONTIN) 300 MG capsule Take 300 mg by mouth 3 (three) times daily as needed (back pain). Max 4 capsules daily    Historical Provider, MD  Oxycodone HCl 20 MG TABS  Take 20 mg by mouth 3 (three) times daily as needed (severe pain).  11/10/14   Historical Provider, MD  pantoprazole (PROTONIX) 40 MG tablet Take 1 tablet (40 mg total) by mouth daily. 11/30/14   Thurnell Lose, MD  predniSONE (DELTASONE) 10 MG tablet Take 2 tablets (20 mg total) by mouth daily. 04/27/15   Samantha Tripp Dowless, PA-C  sucralfate (CARAFATE) 1 GM/10ML suspension Take 10 mLs (1 g total) by mouth 4 (four) times daily -  with meals and at bedtime. 11/30/14   Thurnell Lose, MD  traZODone (DESYREL) 100 MG tablet TAKE 1 TABLET BY MOUTH DAILY AT BEDTIME FOR SLEEP 12/12/14   Historical Provider, MD   BP 128/90 mmHg  Pulse 88  Temp(Src) 99.1 F (37.3 C)  Resp 18  Wt 123 lb (55.792 kg)  SpO2 96% Physical Exam  Constitutional: She is oriented to person, place, and time. She appears well-developed and well-nourished. No distress.  HENT:  Head: Normocephalic and atraumatic.  Mouth/Throat: Oropharynx is clear and moist. No oropharyngeal exudate.  Eyes: EOM are normal.  Neck: Normal range of motion.  Cardiovascular: Normal rate, regular rhythm and normal heart sounds.   Pulmonary/Chest: Effort normal and breath sounds normal. No respiratory distress. She has no wheezes. She has no rales.  Abdominal: Soft. She exhibits no distension. There is no tenderness.  Musculoskeletal: Normal range of motion.  Neurological: She is alert and oriented to person, place, and time.  Skin: Skin is warm and dry.  Psychiatric: She has a normal mood and affect. Judgment normal.  Nursing note and vitals reviewed.   ED Course  Procedures (including critical care time) DIAGNOSTIC STUDIES: Oxygen Saturation is 96% on RA, normal by my interpretation.    COORDINATION OF CARE: 12:04 AM Discussed treatment plan which includes abx with pt at bedside and pt agreed to plan.  Labs Review Labs Reviewed - No data to display  Imaging Review No results found.   EKG Interpretation None      MDM   Final  diagnoses:  Acute bronchitis, unspecified organism    Patient with continued URI-like symptoms for the past 3 weeks. These have been unrelieved with over-the-counter medications. Due to the length of her symptomatology, she will be treated with Zithromax for presumed bronchitis. Follow-up with her primary Dr. if not improving.  I personally performed the services described in this documentation, which was scribed in my presence. The recorded information has been reviewed and is accurate.       Veryl Speak, MD 05/16/15 [phone]

## 2015-05-16 NOTE — Discharge Instructions (Signed)
Zithromax as prescribed.  Continue over-the-counter cough and cold medications as needed for symptom relief.  Follow-up with your primary Dr. if not improving in the next few days, and return to the ER if symptoms initially worsen or change.   Acute Bronchitis Bronchitis is inflammation of the airways that extend from the windpipe into the lungs (bronchi). The inflammation often causes mucus to develop. This leads to a cough, which is the most common symptom of bronchitis.  In acute bronchitis, the condition usually develops suddenly and goes away over time, usually in a couple weeks. Smoking, allergies, and asthma can make bronchitis worse. Repeated episodes of bronchitis may cause further lung problems.  CAUSES Acute bronchitis is most often caused by the same virus that causes a cold. The virus can spread from person to person (contagious) through coughing, sneezing, and touching contaminated objects. SIGNS AND SYMPTOMS   Cough.   Fever.   Coughing up mucus.   Body aches.   Chest congestion.   Chills.   Shortness of breath.   Sore throat.  DIAGNOSIS  Acute bronchitis is usually diagnosed through a physical exam. Your health care provider will also ask you questions about your medical history. Tests, such as chest X-rays, are sometimes done to rule out other conditions.  TREATMENT  Acute bronchitis usually goes away in a couple weeks. Oftentimes, no medical treatment is necessary. Medicines are sometimes given for relief of fever or cough. Antibiotic medicines are usually not needed but may be prescribed in certain situations. In some cases, an inhaler may be recommended to help reduce shortness of breath and control the cough. A cool mist vaporizer may also be used to help thin bronchial secretions and make it easier to clear the chest.  HOME CARE INSTRUCTIONS  Get plenty of rest.   Drink enough fluids to keep your urine clear or pale yellow (unless you have a medical  condition that requires fluid restriction). Increasing fluids may help thin your respiratory secretions (sputum) and reduce chest congestion, and it will prevent dehydration.   Take medicines only as directed by your health care provider.  If you were prescribed an antibiotic medicine, finish it all even if you start to feel better.  Avoid smoking and secondhand smoke. Exposure to cigarette smoke or irritating chemicals will make bronchitis worse. If you are a smoker, consider using nicotine gum or skin patches to help control withdrawal symptoms. Quitting smoking will help your lungs heal faster.   Reduce the chances of another bout of acute bronchitis by washing your hands frequently, avoiding people with cold symptoms, and trying not to touch your hands to your mouth, nose, or eyes.   Keep all follow-up visits as directed by your health care provider.  SEEK MEDICAL CARE IF: Your symptoms do not improve after 1 week of treatment.  SEEK IMMEDIATE MEDICAL CARE IF:  You develop an increased fever or chills.   You have chest pain.   You have severe shortness of breath.  You have bloody sputum.   You develop dehydration.  You faint or repeatedly feel like you are going to pass out.  You develop repeated vomiting.  You develop a severe headache. MAKE SURE YOU:   Understand these instructions.  Will watch your condition.  Will get help right away if you are not doing well or get worse.   This information is not intended to replace advice given to you by your health care provider. Make sure you discuss any questions you have  with your health care provider.   Document Released: 06/13/2004 Document Revised: 05/27/2014 Document Reviewed: 10/27/2012 Elsevier Interactive Patient Education [employer].

## 2015-05-16 NOTE — ED Notes (Signed)
Pt verbalizes understanding of d/c instructions and denies any further needs at this time. 

## 2015-08-12 ENCOUNTER — Emergency Department (HOSPITAL_BASED_OUTPATIENT_CLINIC_OR_DEPARTMENT_OTHER)
Admission: EM | Admit: 2015-08-12 | Discharge: 2015-08-12 | Disposition: A | Discharge status: Home / Self Care | Payer: Medicaid Other | Attending: Emergency Medicine | Admitting: Emergency Medicine

## 2015-08-12 ENCOUNTER — Encounter (HOSPITAL_BASED_OUTPATIENT_CLINIC_OR_DEPARTMENT_OTHER): Payer: Self-pay | Admitting: Emergency Medicine

## 2015-08-12 ENCOUNTER — Emergency Department (HOSPITAL_BASED_OUTPATIENT_CLINIC_OR_DEPARTMENT_OTHER): Payer: Medicaid Other

## 2015-08-12 DIAGNOSIS — E669 Obesity, unspecified: Secondary | ICD-10-CM | POA: Diagnosis not present

## 2015-08-12 DIAGNOSIS — J069 Acute upper respiratory infection, unspecified: Secondary | ICD-10-CM

## 2015-08-12 DIAGNOSIS — M79605 Pain in left leg: Secondary | ICD-10-CM | POA: Diagnosis not present

## 2015-08-12 DIAGNOSIS — Z880 Allergy status to penicillin: Secondary | ICD-10-CM | POA: Diagnosis not present

## 2015-08-12 DIAGNOSIS — Z862 Personal history of diseases of the blood and blood-forming organs and certain disorders involving the immune mechanism: Secondary | ICD-10-CM | POA: Diagnosis not present

## 2015-08-12 DIAGNOSIS — Z872 Personal history of diseases of the skin and subcutaneous tissue: Secondary | ICD-10-CM | POA: Insufficient documentation

## 2015-08-12 DIAGNOSIS — I10 Essential (primary) hypertension: Secondary | ICD-10-CM | POA: Insufficient documentation

## 2015-08-12 DIAGNOSIS — Z8742 Personal history of other diseases of the female genital tract: Secondary | ICD-10-CM | POA: Diagnosis not present

## 2015-08-12 DIAGNOSIS — Z79899 Other long term (current) drug therapy: Secondary | ICD-10-CM | POA: Insufficient documentation

## 2015-08-12 DIAGNOSIS — Z7982 Long term (current) use of aspirin: Secondary | ICD-10-CM | POA: Insufficient documentation

## 2015-08-12 DIAGNOSIS — R109 Unspecified abdominal pain: Secondary | ICD-10-CM | POA: Insufficient documentation

## 2015-08-12 DIAGNOSIS — Z7952 Long term (current) use of systemic steroids: Secondary | ICD-10-CM | POA: Diagnosis not present

## 2015-08-12 DIAGNOSIS — F17210 Nicotine dependence, cigarettes, uncomplicated: Secondary | ICD-10-CM | POA: Insufficient documentation

## 2015-08-12 DIAGNOSIS — M545 Low back pain: Secondary | ICD-10-CM | POA: Insufficient documentation

## 2015-08-12 DIAGNOSIS — B9789 Other viral agents as the cause of diseases classified elsewhere: Secondary | ICD-10-CM

## 2015-08-12 LAB — CBC
HEMATOCRIT: 42.6 % (ref 36.0–46.0)
HEMOGLOBIN: 14.5 g/dL (ref 12.0–15.0)
MCH: 33.2 pg (ref 26.0–34.0)
MCHC: 34 g/dL (ref 30.0–36.0)
MCV: 97.5 fL (ref 78.0–100.0)
Platelets: 282 10*3/uL (ref 150–400)
RBC: 4.37 MIL/uL (ref 3.87–5.11)
RDW: 15 % (ref 11.5–15.5)
WBC: 6.5 10*3/uL (ref 4.0–10.5)

## 2015-08-12 LAB — COMPREHENSIVE METABOLIC PANEL WITH GFR
ALT: 17 U/L (ref 14–54)
AST: 20 U/L (ref 15–41)
Albumin: 3.2 g/dL — ABNORMAL LOW (ref 3.5–5.0)
Alkaline Phosphatase: 71 U/L (ref 38–126)
Anion gap: 7 (ref 5–15)
BUN: 8 mg/dL (ref 6–20)
CO2: 26 mmol/L (ref 22–32)
Calcium: 8.3 mg/dL — ABNORMAL LOW (ref 8.9–10.3)
Chloride: 108 mmol/L (ref 101–111)
Creatinine, Ser: 0.67 mg/dL (ref 0.44–1.00)
GFR calc Af Amer: >60 mL/min
GFR calc non Af Amer: >60 mL/min
Glucose, Bld: 71 mg/dL (ref 65–99)
Potassium: 3.4 mmol/L — ABNORMAL LOW (ref 3.5–5.1)
Sodium: 141 mmol/L (ref 135–145)
Total Bilirubin: 0.7 mg/dL (ref 0.3–1.2)
Total Protein: 6.6 g/dL (ref 6.5–8.1)

## 2015-08-12 LAB — LIPASE, BLOOD: Lipase: 55 U/L — ABNORMAL HIGH (ref 11–51)

## 2015-08-12 MED ORDER — FLUTICASONE PROPIONATE 50 MCG/ACT NA SUSP: 2.0000 | Freq: Every day | NASAL | Status: DC

## 2015-08-12 MED ORDER — LORATADINE 10 MG PO TABS: 10.0000 mg | ORAL_TABLET | Freq: Every day | ORAL | Status: DC

## 2015-08-12 MED ORDER — BENZONATATE 100 MG PO CAPS: 100.0000 mg | ORAL_CAPSULE | Freq: Three times a day (TID) | ORAL | Status: DC

## 2015-08-12 NOTE — ED Notes (Signed)
Patient states that she has had a cramp to her left leg x 2-3 days. Today she started to have lower abdominal pain and back pain. N/V/D

## 2015-08-12 NOTE — ED Notes (Signed)
Patient transported to Ultrasound 

## 2015-08-12 NOTE — ED Provider Notes (Signed)
CSN: [id number]     Arrival date & time 08/12/15  1821 History   First MD Initiated Contact with Patient 08/12/15 1916     Chief Complaint  Patient presents with  . Abdominal Pain  . Leg Pain  . Back Pain   HPI   40 year old female presents today with his complaints. Patient reports that she had what appeared to be a cramp in her left leg yesterday. She reports this was located in the calf radiation up to the posterior knee. She reports the pain subsided but remained achy until time of evaluation. She denied any swelling or edema to the leg, but was told by family member this could potentially be a blood clot.  Patient has no risk factors for DVT or PE.  Patient also notes that she's had 3 days of upper respiratory congestion including rhinorrhea, cough. She notes a history of asthma, nonsmoker. She reports symptoms are worse after going outside, and laying down at night. She denies any fever, chills, chest pain, shortness of breath, abdominal pain, nausea vomiting or diarrhea. She denies any changes in urine color clarity or characteristics.     Past Medical History  Diagnosis Date  . Anemia 2015    "pernicious" and iron deficiency.  as of 2015/2016 no longer requiring po iron or B12 shots.   . Hypertension   . Headache(784.0)     migraines  . Nausea & vomiting 11/28/2014  . Hydradenitis 2001    axillary, multiple surgical I & Ds/excisions.  . Obesity (BMI 30.0-34.9)   . Chondromalacia 2005    left knee. arthroscopy x 2.   . Adnexal cyst 2013    right  . Pityriasis rosea     pt also gives hx of seborrheic dermatitis.    Past Surgical History  Procedure Laterality Date  . Partial hysterectomy    . Laparoscopic cholecystectomy  2002  . Ovarian cyst removal    . R arm surgery    . Gastric bypass  2011    gastric sleeve  . Knee surgery  2005    left  . Cystectomy      2004 axilla bil  . Abdominal hysterectomy    . Medial patellofemoral ligament repair Left 06/08/2013     Procedure: DIAGNOSTIC OPERATIVE ARTHROSCOPY, OPEN MEDIAL PATELLA FEMORAL LIGAMENT RECONSTRUCTION;  Surgeon: Meredith Pel, MD;  Location: [place];  Service: Orthopedics;  Laterality: Left;  with Hamstring autograft  . Fracture surgery      rt arm  . Esophagogastroduodenoscopy N/A 11/30/2014    Procedure: ESOPHAGOGASTRODUODENOSCOPY (EGD);  Surgeon: Jerene Bears, MD;  Location: [facility];  Service: Endoscopy;  Laterality: N/A;   Family History  Problem Relation Age of Onset  . Cancer Maternal Grandfather     lung  . Crohn's disease Father   . Lupus Mother    Social History  Substance Use Topics  . Smoking status: Current Every Day Smoker -- 0.50 packs/day for 10 years    Types: Cigarettes  . Smokeless tobacco: Never Used  . Alcohol Use: No   OB History    No data available     Review of Systems  All other systems reviewed and are negative.   Allergies  Penicillins  Home Medications   Prior to Admission medications   Medication Sig Start Date End Date Taking? Authorizing Provider  albuterol (PROVENTIL HFA;VENTOLIN HFA) 108 (90 BASE) MCG/ACT inhaler Inhale 2 puffs into the lungs every 6 (six) hours as needed (chronic bronchitis). ProAir  Historical Provider, MD  amLODipine (NORVASC) 10 MG tablet Take 10 mg by mouth at bedtime.     Historical Provider, MD  aspirin-acetaminophen-caffeine (EXCEDRIN MIGRAINE) [phone] MG per tablet Take 2 tablets by mouth 3 (three) times daily as needed for headache.    Historical Provider, MD  azithromycin (ZITHROMAX Z-PAK) 250 MG tablet 2 po day one, then 1 daily x 4 days 05/16/15   Veryl Speak, MD  benzonatate (TESSALON) 100 MG capsule Take 1 capsule (100 mg total) by mouth every 8 (eight) hours. 08/12/15   Okey Regal, PA-C  cyclobenzaprine (FLEXERIL) 10 MG tablet Take 10 mg by mouth 3 (three) times daily as needed for muscle spasms (back pain).     Historical Provider, MD  dicyclomine (BENTYL) 10 MG capsule Take 1 capsule (10 mg  total) by mouth 2 (two) times daily. 12/14/14   Jessica D Zehr, PA-C  fluticasone (FLONASE) 50 MCG/ACT nasal spray Place 2 sprays into both nostrils daily. 08/12/15   Okey Regal, PA-C  gabapentin (NEURONTIN) 300 MG capsule Take 300 mg by mouth 3 (three) times daily as needed (back pain). Max 4 capsules daily    Historical Provider, MD  loratadine (CLARITIN) 10 MG tablet Take 1 tablet (10 mg total) by mouth daily. 08/12/15   Okey Regal, PA-C  Oxycodone HCl 20 MG TABS Take 20 mg by mouth 3 (three) times daily as needed (severe pain).  11/10/14   Historical Provider, MD  pantoprazole (PROTONIX) 40 MG tablet Take 1 tablet (40 mg total) by mouth daily. 11/30/14   Thurnell Lose, MD  predniSONE (DELTASONE) 10 MG tablet Take 2 tablets (20 mg total) by mouth daily. 04/27/15   Samantha Tripp Dowless, PA-C  sucralfate (CARAFATE) 1 GM/10ML suspension Take 10 mLs (1 g total) by mouth 4 (four) times daily -  with meals and at bedtime. 11/30/14   Thurnell Lose, MD  traZODone (DESYREL) 100 MG tablet TAKE 1 TABLET BY MOUTH DAILY AT BEDTIME FOR SLEEP 12/12/14   Historical Provider, MD   BP 179/97 mmHg  Pulse 64  Temp(Src) 98.8 F (37.1 C) (Oral)  Resp 18  Ht 6' (1.829 m)  Wt 97.523 kg  BMI 29.15 kg/m2  SpO2 100%   Physical Exam  Constitutional: She is oriented to person, place, and time. She appears well-developed and well-nourished.  HENT:  Head: Normocephalic and atraumatic.  Eyes: Conjunctivae are normal. Pupils are equal, round, and reactive to light. Right eye exhibits no discharge. Left eye exhibits no discharge. No scleral icterus.  Neck: Normal range of motion. No JVD present. No tracheal deviation present.  Cardiovascular: Normal rate, regular rhythm, normal heart sounds and intact distal pulses.  Exam reveals no gallop and no friction rub.   No murmur heard. Pulmonary/Chest: Effort normal and breath sounds normal. No stridor. No respiratory distress. She has no wheezes. She has no rales.  She exhibits no tenderness.  Musculoskeletal: She exhibits tenderness. She exhibits no edema.  Tenderness to palpation of the left calf  Neurological: She is alert and oriented to person, place, and time. Coordination normal.  Skin: Skin is warm and dry. No rash noted. No erythema. No pallor.  Psychiatric: She has a normal mood and affect. Her behavior is normal. Judgment and thought content normal.  Nursing note and vitals reviewed.   ED Course  Procedures (including critical care time) Labs Review Labs Reviewed  LIPASE, BLOOD - Abnormal; Notable for the following:    Lipase 55 (*)    All other  components within normal limits  COMPREHENSIVE METABOLIC PANEL - Abnormal; Notable for the following:    Potassium 3.4 (*)    Calcium 8.3 (*)    Albumin 3.2 (*)    All other components within normal limits  CBC    Imaging Review US Venous Img Lower Unilateral Left  08/12/2015  CLINICAL DATA:  Left calf pain for 3 days following fall from truck EXAM: Left LOWER EXTREMITY VENOUS DOPPLER ULTRASOUND TECHNIQUE: Gray-scale sonography with graded compression, as well as color Doppler and duplex ultrasound were performed to evaluate the lower extremity deep venous systems from the level of the common femoral vein and including the common femoral, femoral, profunda femoral, popliteal and calf veins including the posterior tibial, peroneal and gastrocnemius veins when visible. The superficial great saphenous vein was also interrogated. Spectral Doppler was utilized to evaluate flow at rest and with distal augmentation maneuvers in the common femoral, femoral and popliteal veins. COMPARISON:  None. FINDINGS: Contralateral Common Femoral Vein: Respiratory phasicity is normal and symmetric with the symptomatic side. No evidence of thrombus. Normal compressibility. Common Femoral Vein: No evidence of thrombus. Normal compressibility, respiratory phasicity and response to augmentation. Saphenofemoral Junction: No  evidence of thrombus. Normal compressibility and flow on color Doppler imaging. Profunda Femoral Vein: No evidence of thrombus. Normal compressibility and flow on color Doppler imaging. Femoral Vein: No evidence of thrombus. Normal compressibility, respiratory phasicity and response to augmentation. Popliteal Vein: No evidence of thrombus. Normal compressibility, respiratory phasicity and response to augmentation. Calf Veins: No evidence of thrombus. Normal compressibility and flow on color Doppler imaging. Superficial Great Saphenous Vein: No evidence of thrombus. Normal compressibility and flow on color Doppler imaging. Venous Reflux:  None. Other Findings:  None. IMPRESSION: No evidence of deep venous thrombosis. Electronically Signed   By: Inez Catalina M.D.   On: 08/12/2015 20:26   I have personally reviewed and evaluated these images and lab results as part of my medical decision-making.   EKG Interpretation None      MDM   Final diagnoses:  Viral URI with cough  Pain of left lower extremity    Labs: Lipase, CMP, CBC  Imaging:Lower extremity venous- no DVT  Consults:  Therapeutics:   Discharge Meds: Tessalon, Claritin, Flonase  Assessment/Plan: Patient's presentation is most consistent with viral URI. Lung sounds are clear oxygen and she is normal, no respiratory distress. Patient is afebrile, nontoxic in no acute distress. She was discharged home with above medications, encouraged to monitor for any new or worsening signs or symptoms return immediately if any present. Patient has no DVT on ultrasound, this is likely muscular strain. Patient encouraged follow-up with primary care provider. She verbalized her understanding and agreement today's plan had no further questions or concerns at time of discharge        Okey Regal, PA-C 08/14/15 0055  Leonard Schwartz, MD 08/16/15 [phone]

## 2015-08-13 DIAGNOSIS — I10 Essential (primary) hypertension: Secondary | ICD-10-CM | POA: Insufficient documentation

## 2015-09-22 ENCOUNTER — Encounter (HOSPITAL_BASED_OUTPATIENT_CLINIC_OR_DEPARTMENT_OTHER): Payer: Self-pay | Admitting: *Deleted

## 2015-09-22 ENCOUNTER — Emergency Department (HOSPITAL_BASED_OUTPATIENT_CLINIC_OR_DEPARTMENT_OTHER)
Admission: EM | Admit: 2015-09-22 | Discharge: 2015-09-22 | Disposition: A | Discharge status: Home / Self Care | Payer: Commercial Managed Care - HMO | Attending: Emergency Medicine | Admitting: Emergency Medicine

## 2015-09-22 DIAGNOSIS — R6883 Chills (without fever): Secondary | ICD-10-CM | POA: Insufficient documentation

## 2015-09-22 DIAGNOSIS — F17210 Nicotine dependence, cigarettes, uncomplicated: Secondary | ICD-10-CM | POA: Diagnosis not present

## 2015-09-22 DIAGNOSIS — E669 Obesity, unspecified: Secondary | ICD-10-CM | POA: Insufficient documentation

## 2015-09-22 DIAGNOSIS — I10 Essential (primary) hypertension: Secondary | ICD-10-CM | POA: Insufficient documentation

## 2015-09-22 DIAGNOSIS — L0501 Pilonidal cyst with abscess: Secondary | ICD-10-CM | POA: Diagnosis not present

## 2015-09-22 MED ORDER — LIDOCAINE HCL (PF) 1 % IJ SOLN
5.0000 mL | Freq: Once | INTRAMUSCULAR | Status: AC
  Administered 2015-09-22: 5 mL
  Filled 2015-09-22: qty 5

## 2015-09-22 MED ORDER — HYDROMORPHONE HCL 1 MG/ML IJ SOLN
0.5000 mg | Freq: Once | INTRAMUSCULAR | Status: AC
  Administered 2015-09-22: 0.5 mg via INTRAMUSCULAR
  Filled 2015-09-22: qty 1

## 2015-09-22 MED ORDER — SODIUM BICARBONATE 4 % IV SOLN
5.0000 mL | Freq: Once | INTRAVENOUS | Status: AC
  Administered 2015-09-22: 5 mL via SUBCUTANEOUS
  Filled 2015-09-22: qty 5

## 2015-09-22 MED ORDER — SULFAMETHOXAZOLE-TRIMETHOPRIM 800-160 MG PO TABS: 1.0000 | ORAL_TABLET | Freq: Three times a day (TID) | ORAL | Status: AC

## 2015-09-22 NOTE — ED Notes (Signed)
Pilonidal cyst. States she has a hx of same. She is going to have surgery.

## 2015-09-22 NOTE — ED Provider Notes (Signed)
CSN: [id number]     Arrival date & time 09/22/15  1512 History   First MD Initiated Contact with Patient 09/22/15 1520     Chief Complaint  Patient presents with  . Abscess     Patient is a 40 y.o. female presenting with abscess. The history is provided by the patient.  Abscess patient presents with pilonidal abscess/cyst. She has had them before and may have to have a more extensive drainage/removal procedure in the future. She seen [place] [facility] and has appointment to see Dr. Hassell Done later. She states 5 days ago began to get more painful. Some swelling. Has had some chills. 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  Past Medical History  Diagnosis Date  . Anemia 2015    "pernicious" and iron deficiency.  as of 2015/2016 no longer requiring po iron or B12 shots.   . Hypertension   . Headache(784.0)     migraines  . Nausea & vomiting 11/28/2014  . Hydradenitis 2001    axillary, multiple surgical I & Ds/excisions.  . Obesity (BMI 30.0-34.9)   . Chondromalacia 2005    left knee. arthroscopy x 2.   . Adnexal cyst 2013    right  . Pityriasis rosea     pt also gives hx of seborrheic dermatitis.    Past Surgical History  Procedure Laterality Date  . Partial hysterectomy    . Laparoscopic cholecystectomy  2002  . Ovarian cyst removal    . R arm surgery    . Gastric bypass  2011    gastric sleeve  . Knee surgery  2005    left  . Cystectomy      2004 axilla bil  . Abdominal hysterectomy    . Medial patellofemoral ligament repair Left 06/08/2013    Procedure: DIAGNOSTIC OPERATIVE ARTHROSCOPY, OPEN MEDIAL PATELLA FEMORAL LIGAMENT RECONSTRUCTION;  Surgeon: Meredith Pel, MD;  Location: [place];  Service: Orthopedics;  Laterality: Left;  with Hamstring autograft  . Fracture surgery      rt arm  . Esophagogastroduodenoscopy N/A 11/30/2014    Procedure: ESOPHAGOGASTRODUODENOSCOPY (EGD);  Surgeon: Jerene Bears, MD;  Location[facility];  Service: Endoscopy;   Laterality: N/A;   Family History  Problem Relation Age of Onset  . Cancer Maternal Grandfather     lung  . Crohn's disease Father   . Lupus Mother    Social History  Substance Use Topics  . Smoking status: Current Every Day Smoker -- 0.50 packs/day for 10 years    Types: Cigarettes  . Smokeless tobacco: Never Used  . Alcohol Use: No   OB History    No data available     Review of Systems  Constitutional: Positive for chills. Negative for appetite change.  Gastrointestinal: Negative for abdominal pain.  Endocrine: Negative for polyuria.  Musculoskeletal: Negative for back pain and gait problem.  Skin: Negative for wound.  Neurological: Negative for weakness and numbness.      Allergies  Penicillins  Home Medications   Prior to Admission medications   Medication Sig Start Date End Date Taking? Authorizing Provider  Olmesartan Medoxomil (BENICAR PO) Take by mouth.   Yes Historical Provider, MD  albuterol (PROVENTIL HFA;VENTOLIN HFA) 108 (90 BASE) MCG/ACT inhaler Inhale 2 puffs into the lungs every 6 (six) hours as needed (chronic bronchitis). ProAir    Historical Provider, MD  amLODipine (NORVASC) 10 MG tablet Take 10 mg by mouth at bedtime.     Historical Provider,  MD  aspirin-acetaminophen-caffeine (EXCEDRIN MIGRAINE) [phone] MG per tablet Take 2 tablets by mouth 3 (three) times daily as needed for headache.    Historical Provider, MD  azithromycin (ZITHROMAX Z-PAK) 250 MG tablet 2 po day one, then 1 daily x 4 days 05/16/15   Veryl Speak, MD  benzonatate (TESSALON) 100 MG capsule Take 1 capsule (100 mg total) by mouth every 8 (eight) hours. 08/12/15   Okey Regal, PA-C  cyclobenzaprine (FLEXERIL) 10 MG tablet Take 10 mg by mouth 3 (three) times daily as needed for muscle spasms (back pain).     Historical Provider, MD  dicyclomine (BENTYL) 10 MG capsule Take 1 capsule (10 mg total) by mouth 2 (two) times daily. 12/14/14   Jessica D Zehr, PA-C  fluticasone (FLONASE)  50 MCG/ACT nasal spray Place 2 sprays into both nostrils daily. 08/12/15   Okey Regal, PA-C  gabapentin (NEURONTIN) 300 MG capsule Take 300 mg by mouth 3 (three) times daily as needed (back pain). Max 4 capsules daily    Historical Provider, MD  loratadine (CLARITIN) 10 MG tablet Take 1 tablet (10 mg total) by mouth daily. 08/12/15   Okey Regal, PA-C  Oxycodone HCl 20 MG TABS Take 20 mg by mouth 3 (three) times daily as needed (severe pain).  11/10/14   Historical Provider, MD  pantoprazole (PROTONIX) 40 MG tablet Take 1 tablet (40 mg total) by mouth daily. 11/30/14   Thurnell Lose, MD  predniSONE (DELTASONE) 10 MG tablet Take 2 tablets (20 mg total) by mouth daily. 04/27/15   Samantha Tripp Dowless, PA-C  sucralfate (CARAFATE) 1 GM/10ML suspension Take 10 mLs (1 g total) by mouth 4 (four) times daily -  with meals and at bedtime. 11/30/14   Thurnell Lose, MD  sulfamethoxazole-trimethoprim (BACTRIM DS,SEPTRA DS) 800-160 MG tablet Take 1 tablet by mouth 3 (three) times daily. 09/22/15 09/29/15  Davonna Belling, MD  traZODone (DESYREL) 100 MG tablet TAKE 1 TABLET BY MOUTH DAILY AT BEDTIME FOR SLEEP 12/12/14   Historical Provider, MD   BP 155/108 mmHg  Pulse 110  Temp(Src) 99.7 F (37.6 C) (Oral)  Resp 20  Ht 6' (1.829 m)  Wt 215 lb (97.523 kg)  BMI 29.15 kg/m2  SpO2 99% Physical Exam  Constitutional: She appears well-developed.  HENT:  Head: Atraumatic.  Neck: Neck supple.  Cardiovascular:  Mild tachycardia  Abdominal: Soft.  Neurological: She is alert.  Skin:  Large tender fluctuant area after topical gluteal cleft. There is inferior fluctuance. No erythema.    ED Course  .Marland KitchenIncision and Drainage Date/Time: 09/22/2015 3:45 PM Performed by: Davonna Belling Authorized by: Davonna Belling Consent: Verbal consent obtained. Written consent not obtained. Risks and benefits: risks, benefits and alternatives were discussed Consent given by: patient Required items: required blood  products, implants, devices, and special equipment available Patient identity confirmed: verbally with patient Type: pilonidal cyst Anesthesia: local infiltration Local anesthetic: lidocaine 1% without epinephrine (with neut) Anesthetic total: 4 ml Patient sedated: no Scalpel size: 11 Incision type: elliptical Incision depth: subcutaneous Complexity: simple Drainage: purulent Drainage amount: moderate Wound treatment: wound left open Packing material: 1/2 in iodoform gauze Patient tolerance: Patient tolerated the procedure well with no immediate complications   (including critical care time) Labs Review Labs Reviewed - No data to display  Imaging Review No results found. I have personally reviewed and evaluated these images and lab results as part of my medical decision-making.   EKG Interpretation None      MDM   Final  diagnoses:  Pilonidal abscess    Patient with large pilonidal abscess. Drained with some improvement in pain. Does have some surrounding cellulitis and patient be treated with Bactrim. Has follow-up with general surgery. Packing can be removed in 2-3 days. Discharge home. Patient will return for worsening symptoms.    Davonna Belling, MD 09/22/15 [phone]

## 2015-09-22 NOTE — Discharge Instructions (Signed)
Pilonidal Cyst  A pilonidal cyst is a fluid-filled sac. It forms beneath the skin near your tailbone, at the top of the crease of your buttocks. A pilonidal cyst that is not large or infected may not cause symptoms or problems.  If the cyst becomes irritated or infected, it may fill with pus. This causes pain and swelling (pilonidal abscess). An infected cyst may need to be treated with medicine, drained, or removed.  CAUSES  The cause of a pilonidal cyst is not known. One cause may be a hair that grows into your skin (ingrown hair).  RISK FACTORS  Pilonidal cysts are more common in boys and men. Risk factors include:  · Having lots of hair near the crease of the buttocks.  · Being overweight.  · Having a pilonidal dimple.  · Wearing tight clothing.  · Not bathing or showering frequently.  · Sitting for long periods of time.  SIGNS AND SYMPTOMS  Signs and symptoms of a pilonidal cyst may include:  · Redness.  · Pain and tenderness.  · Warmth.  · Swelling.  · Pus.  · Fever.  DIAGNOSIS  Your health care provider may diagnose a pilonidal cyst based on your symptoms and a physical exam. The health care provider may do a blood test to check for infection. If your cyst is draining pus, your health care provider may take a sample of the drainage to be tested at a laboratory.  TREATMENT  Surgery is the usual treatment for an infected pilonidal cyst. You may also have to take medicines before surgery. The type of surgery you have depends on the size and severity of the infected cyst. The different kinds of surgery include:  · Incision and drainage. This is a procedure to open and drain the cyst.  · Marsupialization. In this procedure, a large cyst or abscess may be opened and kept open by stitching the edges of the skin to the cyst walls.  · Cyst removal. This procedure involves opening the skin and removing all or part of the cyst.  HOME CARE INSTRUCTIONS  · Follow all of your surgeon's instructions carefully if you had  surgery.  · Take medicines only as directed by your health care provider.  · If you were prescribed an antibiotic medicine, finish it all even if you start to feel better.  · Keep the area around your pilonidal cyst clean and dry.  · Clean the area as directed by your health care provider. Pat the area dry with a clean towel. Do not rub it as this may cause bleeding.  · Remove hair from the area around the cyst as directed by your health care provider.  · Do not wear tight clothing or sit in one place for long periods of time.  · There are many different ways to close and cover an incision, including stitches, skin glue, and adhesive strips. Follow your health care provider's instructions on:    Incision care.    Bandage (dressing) changes and removal.    Incision closure removal.  SEEK MEDICAL CARE IF:   · You have drainage, redness, swelling, or pain at the site of the cyst.  · You have a fever.     This information is not intended to replace advice given to you by your health care provider. Make sure you discuss any questions you have with your health care provider.     Document Released: 05/03/2000 Document Revised: 05/27/2014 Document Reviewed: 09/23/2013  Elsevier Interactive Patient 

## 2015-11-20 ENCOUNTER — Encounter (HOSPITAL_BASED_OUTPATIENT_CLINIC_OR_DEPARTMENT_OTHER): Payer: Self-pay | Admitting: *Deleted

## 2015-11-20 ENCOUNTER — Observation Stay (HOSPITAL_BASED_OUTPATIENT_CLINIC_OR_DEPARTMENT_OTHER)
Admission: EM | Admit: 2015-11-20 | Discharge: 2015-11-23 | Disposition: A | Discharge status: Home / Self Care | Payer: Commercial Managed Care - HMO | Attending: Internal Medicine | Admitting: Internal Medicine

## 2015-11-20 ENCOUNTER — Emergency Department (HOSPITAL_BASED_OUTPATIENT_CLINIC_OR_DEPARTMENT_OTHER): Payer: Commercial Managed Care - HMO

## 2015-11-20 DIAGNOSIS — Z90710 Acquired absence of both cervix and uterus: Secondary | ICD-10-CM | POA: Insufficient documentation

## 2015-11-20 DIAGNOSIS — R197 Diarrhea, unspecified: Secondary | ICD-10-CM

## 2015-11-20 DIAGNOSIS — A09 Infectious gastroenteritis and colitis, unspecified: Secondary | ICD-10-CM | POA: Diagnosis present

## 2015-11-20 DIAGNOSIS — Z9884 Bariatric surgery status: Secondary | ICD-10-CM | POA: Insufficient documentation

## 2015-11-20 DIAGNOSIS — R109 Unspecified abdominal pain: Secondary | ICD-10-CM

## 2015-11-20 DIAGNOSIS — B9689 Other specified bacterial agents as the cause of diseases classified elsewhere: Secondary | ICD-10-CM | POA: Diagnosis present

## 2015-11-20 DIAGNOSIS — J449 Chronic obstructive pulmonary disease, unspecified: Secondary | ICD-10-CM | POA: Insufficient documentation

## 2015-11-20 DIAGNOSIS — N760 Acute vaginitis: Secondary | ICD-10-CM | POA: Diagnosis not present

## 2015-11-20 DIAGNOSIS — R112 Nausea with vomiting, unspecified: Secondary | ICD-10-CM

## 2015-11-20 DIAGNOSIS — F17210 Nicotine dependence, cigarettes, uncomplicated: Secondary | ICD-10-CM | POA: Insufficient documentation

## 2015-11-20 DIAGNOSIS — A020 Salmonella enteritis: Principal | ICD-10-CM | POA: Insufficient documentation

## 2015-11-20 DIAGNOSIS — I10 Essential (primary) hypertension: Secondary | ICD-10-CM | POA: Diagnosis present

## 2015-11-20 DIAGNOSIS — E669 Obesity, unspecified: Secondary | ICD-10-CM | POA: Diagnosis not present

## 2015-11-20 DIAGNOSIS — Z6831 Body mass index (BMI) 31.0-31.9, adult: Secondary | ICD-10-CM | POA: Diagnosis not present

## 2015-11-20 DIAGNOSIS — Z880 Allergy status to penicillin: Secondary | ICD-10-CM | POA: Insufficient documentation

## 2015-11-20 DIAGNOSIS — M545 Low back pain, unspecified: Secondary | ICD-10-CM | POA: Diagnosis present

## 2015-11-20 DIAGNOSIS — Z9049 Acquired absence of other specified parts of digestive tract: Secondary | ICD-10-CM | POA: Diagnosis not present

## 2015-11-20 DIAGNOSIS — G8929 Other chronic pain: Secondary | ICD-10-CM | POA: Diagnosis present

## 2015-11-20 DIAGNOSIS — K529 Noninfective gastroenteritis and colitis, unspecified: Secondary | ICD-10-CM | POA: Diagnosis present

## 2015-11-20 DIAGNOSIS — G43909 Migraine, unspecified, not intractable, without status migrainosus: Secondary | ICD-10-CM | POA: Diagnosis not present

## 2015-11-20 DIAGNOSIS — J4489 Other specified chronic obstructive pulmonary disease: Secondary | ICD-10-CM | POA: Diagnosis present

## 2015-11-20 HISTORY — DX: Noninfective gastroenteritis and colitis, unspecified: K52.9

## 2015-11-20 LAB — CBC WITH DIFFERENTIAL/PLATELET
BASOS PCT: 0 %
Basophils Absolute: 0 10*3/uL (ref 0.0–0.1)
EOS ABS: 0.1 10*3/uL (ref 0.0–0.7)
EOS PCT: 1 %
HCT: 42.6 % (ref 36.0–46.0)
HEMOGLOBIN: 14.7 g/dL (ref 12.0–15.0)
Lymphocytes Relative: 37 %
Lymphs Abs: 2.1 10*3/uL (ref 0.7–4.0)
MCH: 33.6 pg (ref 26.0–34.0)
MCHC: 34.5 g/dL (ref 30.0–36.0)
MCV: 97.3 fL (ref 78.0–100.0)
MONO ABS: 0.4 10*3/uL (ref 0.1–1.0)
MONOS PCT: 8 %
NEUTROS PCT: 54 %
Neutro Abs: 3.1 10*3/uL (ref 1.7–7.7)
PLATELETS: 260 10*3/uL (ref 150–400)
RBC: 4.38 MIL/uL (ref 3.87–5.11)
RDW: 12.7 % (ref 11.5–15.5)
WBC: 5.7 10*3/uL (ref 4.0–10.5)

## 2015-11-20 LAB — COMPREHENSIVE METABOLIC PANEL
ALBUMIN: 3.1 g/dL — AB (ref 3.5–5.0)
ALT: 13 U/L — ABNORMAL LOW (ref 14–54)
AST: 17 U/L (ref 15–41)
Alkaline Phosphatase: 69 U/L (ref 38–126)
Anion gap: 7 (ref 5–15)
BUN: 9 mg/dL (ref 6–20)
CALCIUM: 8.2 mg/dL — AB (ref 8.9–10.3)
CO2: 25 mmol/L (ref 22–32)
CREATININE: 0.79 mg/dL (ref 0.44–1.00)
Chloride: 108 mmol/L (ref 101–111)
Glucose, Bld: 95 mg/dL (ref 65–99)
Potassium: 3.5 mmol/L (ref 3.5–5.1)
SODIUM: 140 mmol/L (ref 135–145)
TOTAL PROTEIN: 6.4 g/dL — AB (ref 6.5–8.1)
Total Bilirubin: 0.4 mg/dL (ref 0.3–1.2)

## 2015-11-20 LAB — LIPASE, BLOOD: Lipase: 47 U/L (ref 11–51)

## 2015-11-20 MED ORDER — MORPHINE SULFATE (PF) 4 MG/ML IV SOLN
4.0000 mg | Freq: Once | INTRAVENOUS | Status: AC
  Administered 2015-11-20: 4 mg via INTRAVENOUS
  Filled 2015-11-20: qty 1

## 2015-11-20 MED ORDER — SODIUM CHLORIDE 0.9 % IV BOLUS (SEPSIS)
1000.0000 mL | Freq: Once | INTRAVENOUS | Status: AC
  Administered 2015-11-20: 1000 mL via INTRAVENOUS

## 2015-11-20 MED ORDER — ONDANSETRON HCL 4 MG/2ML IJ SOLN
4.0000 mg | Freq: Once | INTRAMUSCULAR | Status: AC
Start: 2015-11-20 — End: 2015-11-20
  Administered 2015-11-20: 4 mg via INTRAVENOUS
  Filled 2015-11-20: qty 2

## 2015-11-20 NOTE — ED Notes (Signed)
Abdominal pain since yesterday. Pain goes into her right back.

## 2015-11-20 NOTE — ED Notes (Signed)
MD at bedside. 

## 2015-11-20 NOTE — ED Notes (Signed)
Pt unable to provide urine sample at this time 

## 2015-11-20 NOTE — ED Provider Notes (Signed)
By signing my name below, I, Dyke Brackett, attest that this documentation has been prepared under the direction and in the presence of [place], DO . Electronically Signed: Dyke Brackett, Scribe. 11/20/2015. 11:59 PM.  TIME SEEN: 11:00 PM  CHIEF COMPLAINT:  Chief Complaint  Patient presents with  . Abdominal Pain    HPI: HPI Comments:  [name] is a 40 y.o. female with PMHx of ovarian cysts who presents to the Emergency Department complaining of severe, sharp, RLQ abdominal pain onset yesterday. She states the pain originated in her periumbilical region and is now in RLQ with radiation to the back.  Pt has been taking Goody's powder and tylenol with no relief. No alleviating factors noted. She states pain is worsened with movement. Pt reports that she has never experienced similar pain before.  She also complains of  associated nausea, Vomiting and diarrhea. States she has had fevers of 100 at home.Pt reports she has had a partial hysterectomy, cholecystectomy, history of surgery on her uterine fibroids, ovarian cystectomy and a gastric sleeve. Pt is sexually active and states she only has one partner.  No history of STDs. She denies recent sick contact or foreign travel.  Pt denies any vaginal bleeding, vaginal discharge, hematuria and dysuria. States that this feels unlike her ovarian cyst.  PCP - [facility]  ROS: See HPI Constitutional: fever  Eyes: no drainage  ENT: no runny nose   Cardiovascular:  no chest pain  Resp: no SOB  GI: vomiting and diarrhea GU: no dysuria Integumentary: no rash  Allergy: no hives  Musculoskeletal: no leg swelling  Neurological: no slurred speech ROS otherwise negative  PAST MEDICAL HISTORY/PAST SURGICAL HISTORY:  Past Medical History  Diagnosis Date  . Anemia 2015    "pernicious" and iron deficiency.  as of 2015/2016 no longer requiring po iron or B12 shots.   . Hypertension   . Headache(784.0)     migraines  . Nausea &  vomiting 11/28/2014  . Hydradenitis 2001    axillary, multiple surgical I & Ds/excisions.  . Obesity (BMI 30.0-34.9)   . Chondromalacia 2005    left knee. arthroscopy x 2.   . Adnexal cyst 2013    right  . Pityriasis rosea     pt also gives hx of seborrheic dermatitis.     MEDICATIONS:  Prior to Admission medications   Medication Sig Start Date End Date Taking? Authorizing Provider  albuterol (PROVENTIL HFA;VENTOLIN HFA) 108 (90 BASE) MCG/ACT inhaler Inhale 2 puffs into the lungs every 6 (six) hours as needed (chronic bronchitis). ProAir    Historical Provider, MD  amLODipine (NORVASC) 10 MG tablet Take 10 mg by mouth at bedtime.     Historical Provider, MD  aspirin-acetaminophen-caffeine (EXCEDRIN MIGRAINE) [phone] MG per tablet Take 2 tablets by mouth 3 (three) times daily as needed for headache.    Historical Provider, MD  azithromycin (ZITHROMAX Z-PAK) 250 MG tablet 2 po day one, then 1 daily x 4 days 05/16/15   Veryl Speak, MD  benzonatate (TESSALON) 100 MG capsule Take 1 capsule (100 mg total) by mouth every 8 (eight) hours. 08/12/15   Okey Regal, PA-C  cyclobenzaprine (FLEXERIL) 10 MG tablet Take 10 mg by mouth 3 (three) times daily as needed for muscle spasms (back pain).     Historical Provider, MD  dicyclomine (BENTYL) 10 MG capsule Take 1 capsule (10 mg total) by mouth 2 (two) times daily. 12/14/14   Laban Emperor Zehr, PA-C  fluticasone Asencion Islam)  50 MCG/ACT nasal spray Place 2 sprays into both nostrils daily. 08/12/15   Okey Regal, PA-C  gabapentin (NEURONTIN) 300 MG capsule Take 300 mg by mouth 3 (three) times daily as needed (back pain). Max 4 capsules daily    Historical Provider, MD  loratadine (CLARITIN) 10 MG tablet Take 1 tablet (10 mg total) by mouth daily. 08/12/15   Okey Regal, PA-C  Olmesartan Medoxomil (BENICAR PO) Take by mouth.    Historical Provider, MD  Oxycodone HCl 20 MG TABS Take 20 mg by mouth 3 (three) times daily as needed (severe pain).  11/10/14    Historical Provider, MD  pantoprazole (PROTONIX) 40 MG tablet Take 1 tablet (40 mg total) by mouth daily. 11/30/14   Thurnell Lose, MD  predniSONE (DELTASONE) 10 MG tablet Take 2 tablets (20 mg total) by mouth daily. 04/27/15   Samantha Tripp Dowless, PA-C  sucralfate (CARAFATE) 1 GM/10ML suspension Take 10 mLs (1 g total) by mouth 4 (four) times daily -  with meals and at bedtime. 11/30/14   Thurnell Lose, MD  traZODone (DESYREL) 100 MG tablet TAKE 1 TABLET BY MOUTH DAILY AT BEDTIME FOR SLEEP 12/12/14   Historical Provider, MD    ALLERGIES:  Allergies  Allergen Reactions  . Penicillins Anaphylaxis, Swelling and Rash    Face and throat swell    SOCIAL HISTORY:  Social History  Substance Use Topics  . Smoking status: Current Every Day Smoker -- 0.50 packs/day for 10 years    Types: Cigarettes  . Smokeless tobacco: Never Used  . Alcohol Use: No    FAMILY HISTORY: Family History  Problem Relation Age of Onset  . Cancer Maternal Grandfather     lung  . Crohn's disease Father   . Lupus Mother     EXAM: BP 143/87 mmHg  Pulse 95  Temp(Src) 99.2 F (37.3 C) (Oral)  Resp 20  Ht 6' (1.829 m)  Wt 220 lb (99.791 kg)  BMI 29.83 kg/m2  SpO2 97% CONSTITUTIONAL: Alert and oriented and responds appropriately to questions. She appears uncomfortable. Nontoxic appearing.  HEAD: Normocephalic EYES: Conjunctivae clear, PERRL ENT: normal nose; no rhinorrhea; moist mucous membranes NECK: Supple, no meningismus, no LAD  CARD: RRR; S1 and S2 appreciated; no murmurs, no clicks, no rubs, no gallops RESP: Normal chest excursion without splinting or tachypnea; breath sounds clear and equal bilaterally; no wheezes, no rhonchi, no rales, no hypoxia or respiratory distress, speaking full sentences ABD/GI: Normal bowel sounds; non-distended; soft, no peritoneal signs, tender to palpation at McBurney's point with voluntary guarding. No rebound. GU:  Normal external genitalia. No lesions, rashes  noted. Patient has no vaginal bleeding on exam. Small amount of white vaginal discharge.  No adnexal tenderness, mass or fullness. Cervix is absent on exam.  Chaperone present for exam. BACK:  The back appears normal and is non-tender to palpation, there is no CVA tenderness EXT: Normal ROM in all joints; non-tender to palpation; no edema; normal capillary refill; no cyanosis, no calf tenderness or swelling    SKIN: Normal color for age and race; warm; no rash NEURO: Moves all extremities equally, sensation to light touch intact diffusely, cranial nerves II through XII intact PSYCH: The patient's mood and manner are appropriate. Grooming and personal hygiene are appropriate.  MEDICAL DECISION MAKING: Patient here in the right lower quadrant pain. Has had low-grade fevers at home, nausea, vomiting and diarrhea. Has history of ovarian cyst but states this feels different. Pelvic exam is unremarkable and she  does not appear to have adnexal tenderness. Concern for possible appendicitis. We'll obtain labs, urine, pelvic cultures are pending and a CT of her abdomen and pelvis. We'll give IV fluids, pain and nausea medicine.  ED PROGRESS: 2:05 AM Patient's labs are unremarkable. No leukocytosis. Urine shows no sign of infection.  CT scan shows mild wall thickening of the cecum, ascending and proximal transverse colon raising concern for infectious versus inflammatory process. Given her fever and sent onset of symptoms, I suspect that this is infectious in nature. She has not had a bowel movement in the ED but have discussed with her if she feels like she will have one we can send stool cultures. We'll start her on Cipro and Flagyl. She has never had a colonoscopy. She does report that her father had a history of Crohn's disease. She has never had similar symptoms. Given it has been difficult to manage patient's pain in the emergency department, I feel she will need admission for IV antibiotics and pain control.  Will discuss with hospitalist at [facility]. She does not currently have a gastroenterologist. Discussed with her that she will likely need a colonoscopy once her symptoms, and inflammation have improved. She is comfortable with this plan and would like admission over discharge home.   2:25 AM  Discussed patient's case with hospitalist, Dr. [name] Books.  Recommend admission to observation, medical bed.  I will place holding orders per their request. Patient and family (if present) updated with plan. Care transferred to hospitalist service upon arrival to [facility].  I reviewed all nursing notes, vitals, pertinent old records, EKGs, labs, imaging (as available).   I personally performed the services described in this documentation, which was scribed in my presence. The recorded information has been reviewed and is accurate.      Winter, DO 11/21/15 [phone]

## 2015-11-21 ENCOUNTER — Encounter (HOSPITAL_COMMUNITY): Payer: Self-pay | Admitting: General Practice

## 2015-11-21 DIAGNOSIS — J449 Chronic obstructive pulmonary disease, unspecified: Secondary | ICD-10-CM | POA: Diagnosis not present

## 2015-11-21 DIAGNOSIS — N760 Acute vaginitis: Secondary | ICD-10-CM

## 2015-11-21 DIAGNOSIS — B9689 Other specified bacterial agents as the cause of diseases classified elsewhere: Secondary | ICD-10-CM | POA: Diagnosis present

## 2015-11-21 DIAGNOSIS — M545 Low back pain, unspecified: Secondary | ICD-10-CM | POA: Diagnosis present

## 2015-11-21 DIAGNOSIS — A09 Infectious gastroenteritis and colitis, unspecified: Secondary | ICD-10-CM | POA: Diagnosis present

## 2015-11-21 DIAGNOSIS — G8929 Other chronic pain: Secondary | ICD-10-CM | POA: Diagnosis present

## 2015-11-21 DIAGNOSIS — A499 Bacterial infection, unspecified: Secondary | ICD-10-CM

## 2015-11-21 DIAGNOSIS — I10 Essential (primary) hypertension: Secondary | ICD-10-CM | POA: Diagnosis not present

## 2015-11-21 DIAGNOSIS — K529 Noninfective gastroenteritis and colitis, unspecified: Secondary | ICD-10-CM | POA: Diagnosis present

## 2015-11-21 DIAGNOSIS — J45909 Unspecified asthma, uncomplicated: Secondary | ICD-10-CM

## 2015-11-21 LAB — URINALYSIS, ROUTINE W REFLEX MICROSCOPIC
Glucose, UA: NEGATIVE mg/dL
Hgb urine dipstick: NEGATIVE
Ketones, ur: 15 mg/dL — AB
LEUKOCYTES UA: NEGATIVE
NITRITE: NEGATIVE
PH: 5.5 (ref 5.0–8.0)
Protein, ur: NEGATIVE mg/dL
SPECIFIC GRAVITY, URINE: 1.026 (ref 1.005–1.030)

## 2015-11-21 LAB — WET PREP, GENITAL
SPERM: NONE SEEN
TRICH WET PREP: NONE SEEN
WBC, Wet Prep HPF POC: NONE SEEN
Yeast Wet Prep HPF POC: NONE SEEN

## 2015-11-21 MED ORDER — ALBUTEROL SULFATE (2.5 MG/3ML) 0.083% IN NEBU: 2.5000 mg | INHALATION_SOLUTION | Freq: Four times a day (QID) | RESPIRATORY_TRACT | Status: DC | PRN

## 2015-11-21 MED ORDER — ONDANSETRON HCL 4 MG/2ML IJ SOLN: 4.0000 mg | Freq: Four times a day (QID) | INTRAMUSCULAR | Status: DC | PRN

## 2015-11-21 MED ORDER — HYDROMORPHONE HCL 1 MG/ML IJ SOLN
1.0000 mg | Freq: Once | INTRAMUSCULAR | Status: AC
  Administered 2015-11-21: 1 mg via INTRAVENOUS

## 2015-11-21 MED ORDER — HYDROMORPHONE HCL 1 MG/ML IJ SOLN
1.0000 mg | Freq: Once | INTRAMUSCULAR | Status: AC
  Administered 2015-11-21: 1 mg via INTRAVENOUS
  Filled 2015-11-21: qty 1

## 2015-11-21 MED ORDER — ONDANSETRON HCL 4 MG/2ML IJ SOLN: 4.0000 mg | Freq: Three times a day (TID) | INTRAMUSCULAR | Status: DC | PRN

## 2015-11-21 MED ORDER — HYDROMORPHONE HCL 1 MG/ML IJ SOLN
1.0000 mg | INTRAMUSCULAR | Status: DC | PRN
  Administered 2015-11-21: 1 mg via INTRAVENOUS
  Filled 2015-11-21: qty 1

## 2015-11-21 MED ORDER — METRONIDAZOLE IN NACL 5-0.79 MG/ML-% IV SOLN
500.0000 mg | Freq: Once | INTRAVENOUS | Status: AC
  Administered 2015-11-21: 500 mg via INTRAVENOUS
  Filled 2015-11-21: qty 100

## 2015-11-21 MED ORDER — KETOROLAC TROMETHAMINE 30 MG/ML IJ SOLN
30.0000 mg | Freq: Once | INTRAMUSCULAR | Status: AC
  Administered 2015-11-21: 30 mg via INTRAVENOUS
  Filled 2015-11-21: qty 1

## 2015-11-21 MED ORDER — IOPAMIDOL (ISOVUE-300) INJECTION 61%
100.0000 mL | Freq: Once | INTRAVENOUS | Status: AC | PRN
  Administered 2015-11-21: 100 mL via INTRAVENOUS

## 2015-11-21 MED ORDER — SODIUM CHLORIDE 0.9 % IV SOLN
INTRAVENOUS | Status: AC
  Administered 2015-11-21: 17:00:00 via INTRAVENOUS

## 2015-11-21 MED ORDER — METRONIDAZOLE IN NACL 5-0.79 MG/ML-% IV SOLN
500.0000 mg | Freq: Three times a day (TID) | INTRAVENOUS | Status: DC
  Administered 2015-11-21 – 2015-11-23 (×6): 500 mg via INTRAVENOUS
  Filled 2015-11-21 (×10): qty 100

## 2015-11-21 MED ORDER — ALBUTEROL SULFATE HFA 108 (90 BASE) MCG/ACT IN AERS: 2.0000 | INHALATION_SPRAY | Freq: Four times a day (QID) | RESPIRATORY_TRACT | Status: DC | PRN

## 2015-11-21 MED ORDER — CIPROFLOXACIN IN D5W 400 MG/200ML IV SOLN
400.0000 mg | Freq: Once | INTRAVENOUS | Status: AC
  Administered 2015-11-21: 400 mg via INTRAVENOUS
  Filled 2015-11-21: qty 200

## 2015-11-21 MED ORDER — IPRATROPIUM-ALBUTEROL 0.5-2.5 (3) MG/3ML IN SOLN: 3.0000 mL | RESPIRATORY_TRACT | Status: DC | PRN

## 2015-11-21 MED ORDER — MORPHINE SULFATE (PF) 2 MG/ML IV SOLN
2.0000 mg | INTRAVENOUS | Status: DC | PRN
  Administered 2015-11-21: 4 mg via INTRAVENOUS
  Administered 2015-11-21: 2 mg via INTRAVENOUS
  Administered 2015-11-21 – 2015-11-23 (×6): 4 mg via INTRAVENOUS
  Filled 2015-11-21 (×3): qty 2
  Filled 2015-11-21: qty 1
  Filled 2015-11-21 (×4): qty 2

## 2015-11-21 MED ORDER — CIPROFLOXACIN IN D5W 400 MG/200ML IV SOLN
400.0000 mg | Freq: Two times a day (BID) | INTRAVENOUS | Status: DC
  Administered 2015-11-21 – 2015-11-23 (×4): 400 mg via INTRAVENOUS
  Filled 2015-11-21 (×5): qty 200

## 2015-11-21 MED ORDER — ACETAMINOPHEN 650 MG RE SUPP: 650.0000 mg | Freq: Four times a day (QID) | RECTAL | Status: DC | PRN

## 2015-11-21 MED ORDER — ACETAMINOPHEN 325 MG PO TABS: 650.0000 mg | ORAL_TABLET | Freq: Four times a day (QID) | ORAL | Status: DC | PRN

## 2015-11-21 MED ORDER — HYDROMORPHONE HCL 1 MG/ML IJ SOLN
INTRAMUSCULAR | Status: AC
  Administered 2015-11-21: 1 mg via INTRAVENOUS
  Filled 2015-11-21: qty 1

## 2015-11-21 MED ORDER — FLUTICASONE PROPIONATE 50 MCG/ACT NA SUSP
2.0000 | Freq: Every day | NASAL | Status: DC
  Administered 2015-11-22 – 2015-11-23 (×2): 2 via NASAL
  Filled 2015-11-21: qty 16

## 2015-11-21 MED ORDER — ENOXAPARIN SODIUM 40 MG/0.4ML ~~LOC~~ SOLN
40.0000 mg | SUBCUTANEOUS | Status: DC
  Administered 2015-11-21 – 2015-11-23 (×3): 40 mg via SUBCUTANEOUS
  Filled 2015-11-21 (×3): qty 0.4

## 2015-11-21 MED ORDER — PROCHLORPERAZINE EDISYLATE 5 MG/ML IJ SOLN
10.0000 mg | Freq: Four times a day (QID) | INTRAMUSCULAR | Status: DC | PRN
  Filled 2015-11-21: qty 2

## 2015-11-21 MED ORDER — SODIUM CHLORIDE 0.9 % IV SOLN
INTRAVENOUS | Status: AC
  Administered 2015-11-21 (×2): via INTRAVENOUS

## 2015-11-21 MED ORDER — SODIUM CHLORIDE 0.9 % IV BOLUS (SEPSIS)
1000.0000 mL | Freq: Once | INTRAVENOUS | Status: AC
  Administered 2015-11-21: 1000 mL via INTRAVENOUS

## 2015-11-21 NOTE — H&P (Signed)
History and Physical    [name] [id number] DOB: 25-Oct-1975 DOA: 11/20/2015   PCP: Philis Fendt, MD   Patient coming from/Resides with: Private residence/lives alone  Chief Complaint: Severe, right-sided abdominal pain  HPI: [name] is a 40 y.o. female with medical history significant for hypertension reported as controlled with exercise by patient, chronic asthmatic bronchitis and chronic low back pain who presents to the hospital with progressive abdominal pain beginning this past Saturday, July 1. Patient reports the pain began as her typical low back pain not resolved with her prescribed and over-the-counter medications. Later she develop periumbilical pain that eventually radiated more to the right side right middle and lower quadrants. The pain was crampy in nature and was associated by nonbloody loose stools 4-5 per day. She attempted to eat this would increase her right-sided pain and cause vomiting. Because of family history of Crohn's patient was concerned that the symptoms may represent new onset Crohn's disease.   ED Course:  Vital signs: PO temp 99.2-BP 143/87-pulse 95 - respirations 20-RA saturations 97% CT abdomen and pelvis with contrast: Mild wall thickening along the cecum, ascending and proximal transverse colon raising concern for mild acute infectious or inflammatory process. Also a small nonspecific 1.5 cm hypodensity right adrenal gland stable since 2016 Lab data: Sodium 140, potassium 3.5, BUN 9, creatinine 0.79, albumin 3.1, LFTs not elevated, WBC 5700 with normal differential, hemoglobin 14.7, platelets 260,000, wet prep positive for clue cells, urinalysis unremarkable except for 15 ketones and small amount of bilirubin; urine culture not obtained Medications and treatment: Zofran 4 mg IV 1, morphine 4 mg IV 1, 2 L normal saline bolus, Dilaudid 1 mg IV 2 doses, Cipro 400 mg IV 1, Flagyl 500 mg IV 1  Review of Systems:  In addition to the HPI  above,  No chills, myalgias or other constitutional symptoms No Headache, changes with Vision or hearing, new weakness, tingling, numbness in any extremity, No problems swallowing food or Liquids, indigestion/reflux No Chest pain, Cough or Shortness of Breath, palpitations, orthopnea or DOE No melena or hematochezia, no dark tarry stools, Asian does not have issues with recurrent abdominal pain associated with diarrhea and has not had any bloody diarrhea No dysuria, hematuria or flank pain No new skin rashes, lesions, masses or bruises, No new joints pains-aches; patient reports has chronic low back pain but utilizes her prescribed oxycodone very infrequently No recent weight gain or loss No polyuria, polydypsia or polyphagia,   Past Medical History  Diagnosis Date  . Anemia 2015    "pernicious" and iron deficiency.  as of 2015/2016 no longer requiring po iron or B12 shots.   . Hypertension   . Headache(784.0)     migraines  . Nausea & vomiting 11/28/2014  . Hydradenitis 2001    axillary, multiple surgical I & Ds/excisions.  . Obesity (BMI 30.0-34.9)   . Chondromalacia 2005    left knee. arthroscopy x 2.   . Adnexal cyst 2013    right  . Pityriasis rosea     pt also gives hx of seborrheic dermatitis.     Past Surgical History  Procedure Laterality Date  . Partial hysterectomy    . Laparoscopic cholecystectomy  2002  . Ovarian cyst removal    . R arm surgery    . Gastric bypass  2011    gastric sleeve  . Knee surgery  2005    left  . Cystectomy      2004 axilla bil  .  Abdominal hysterectomy    . Medial patellofemoral ligament repair Left 06/08/2013    Procedure: DIAGNOSTIC OPERATIVE ARTHROSCOPY, OPEN MEDIAL PATELLA FEMORAL LIGAMENT RECONSTRUCTION;  Surgeon: Meredith Pel, MD;  Location: [facility];  Service: Orthopedics;  Laterality: Left;  with Hamstring autograft  . Fracture surgery      rt arm  . Esophagogastroduodenoscopy N/A 11/30/2014    Procedure:  ESOPHAGOGASTRODUODENOSCOPY (EGD);  Surgeon: Jerene Bears, MD;  [facility];  Service: Endoscopy;  Laterality: N/A;    Social History   Social History  . Marital Status: Divorced    Spouse Name: N/A  . Number of Children: N/A  . Years of Education: N/A   Occupational History  . 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    Social History Main Topics  . Smoking status: Current Every Day Smoker -- 0.50 packs/day for 10 years    Types: Cigarettes  . Smokeless tobacco: Never Used  . Alcohol Use: No  . Drug Use: No  . Sexual Activity: Not on file   Other Topics Concern  . Not on file   Social History Narrative    Mobility: Without assistive devices Work history: Patient is employed outside the home   Allergies  Allergen Reactions  . Penicillins Anaphylaxis, Swelling and Rash    Face and throat swell    Family History  Problem Relation Age of Onset  . Cancer Maternal Grandfather     lung  . Crohn's disease Father   . Lupus Mother      Prior to Admission medications   Medication Sig Start Date End Date Taking? Authorizing Provider  albuterol (PROVENTIL HFA;VENTOLIN HFA) 108 (90 BASE) MCG/ACT inhaler Inhale 2 puffs into the lungs every 6 (six) hours as needed (chronic bronchitis). ProAir    Historical Provider, MD  amLODipine (NORVASC) 10 MG tablet Take 10 mg by mouth at bedtime.     Historical Provider, MD  aspirin-acetaminophen-caffeine (EXCEDRIN MIGRAINE) [phone] MG per tablet Take 2 tablets by mouth 3 (three) times daily as needed for headache.    Historical Provider, MD  azithromycin (ZITHROMAX Z-PAK) 250 MG tablet 2 po day one, then 1 daily x 4 days 05/16/15   Veryl Speak, MD  benzonatate (TESSALON) 100 MG capsule Take 1 capsule (100 mg total) by mouth every 8 (eight) hours. 08/12/15   Okey Regal, PA-C  cyclobenzaprine (FLEXERIL) 10 MG tablet Take 10 mg by mouth 3 (three) times daily as needed for muscle spasms  (back pain).     Historical Provider, MD  dicyclomine (BENTYL) 10 MG capsule Take 1 capsule (10 mg total) by mouth 2 (two) times daily. 12/14/14   Jessica D Zehr, PA-C  fluticasone (FLONASE) 50 MCG/ACT nasal spray Place 2 sprays into both nostrils daily. 08/12/15   Okey Regal, PA-C  gabapentin (NEURONTIN) 300 MG capsule Take 300 mg by mouth 3 (three) times daily as needed (back pain). Max 4 capsules daily    Historical Provider, MD  loratadine (CLARITIN) 10 MG tablet Take 1 tablet (10 mg total) by mouth daily. 08/12/15   Okey Regal, PA-C  Olmesartan Medoxomil (BENICAR PO) Take by mouth.    Historical Provider, MD  Oxycodone HCl 20 MG TABS Take 20 mg by mouth 3 (three) times daily as needed (severe pain).  11/10/14   Historical Provider, MD  pantoprazole (PROTONIX) 40 MG tablet Take 1 tablet (40 mg total) by mouth daily. 11/30/14   Thurnell Lose, MD  predniSONE (DELTASONE)  10 MG tablet Take 2 tablets (20 mg total) by mouth daily. 04/27/15   Samantha Tripp Dowless, PA-C  sucralfate (CARAFATE) 1 GM/10ML suspension Take 10 mLs (1 g total) by mouth 4 (four) times daily -  with meals and at bedtime. 11/30/14   Thurnell Lose, MD  traZODone (DESYREL) 100 MG tablet TAKE 1 TABLET BY MOUTH DAILY AT BEDTIME FOR SLEEP 12/12/14   Historical Provider, MD    Physical Exam: Filed Vitals:   11/21/15 0215 11/21/15 0300 11/21/15 0330 11/21/15 0446  BP: 157/88 144/100 138/98 154/95  Pulse: 75 62 64 67  Temp:   98.5 F (36.9 C) 97.7 F (36.5 C)  TempSrc:   Oral Oral  Resp: 16  16 17   Height:    6' (1.829 m)  Weight:    229 lb 9.6 oz (104.146 kg)  SpO2: 99% 96% 98% 99%      Constitutional: NAD, calm, comfortable Eyes: PERRL, lids and conjunctivae normal ENMT: Mucous membranes are moist. Posterior pharynx clear of any exudate or lesions.Normal dentition.  Neck: normal, supple, no masses, no thyromegaly Respiratory: Essentially clear to auscultation bilaterally w/ a few scattered wheezes, no crackles.  Normal respiratory effort. No accessory muscle use.  Cardiovascular: Regular rate and rhythm, no murmurs / rubs / gallops. No extremity edema. 2+ pedal pulses. No carotid bruits.  Abdomen: Exquisitely tender with minimal palpation between the right lower quadrant and the right upper quadrant with guarding but no rebounding, no masses palpated. No hepatosplenomegaly. Bowel sounds positive.  Musculoskeletal: no clubbing / cyanosis. No joint deformity upper and lower extremities. Good ROM, no contractures. Normal muscle tone.  Skin: no rashes, lesions, ulcers. No induration Neurologic: CN 2-12 grossly intact. Sensation intact, DTR normal. Strength 5/5 x all 4 extremities.  Psychiatric: Normal judgment and insight. Alert and oriented x 3. Normal mood.    Labs on Admission: I have personally reviewed following labs and imaging studies  CBC:  Recent Labs Lab 11/20/15 2310  WBC 5.7  NEUTROABS 3.1  HGB 14.7  HCT 42.6  MCV 97.3  PLT 123456   Basic Metabolic Panel:  Recent Labs Lab 11/20/15 2310  NA 140  K 3.5  CL 108  CO2 25  GLUCOSE 95  BUN 9  CREATININE 0.79  CALCIUM 8.2*   GFR: Estimated Creatinine Clearance: 126.2 mL/min (by C-G formula based on Cr of 0.79). Liver Function Tests:  Recent Labs Lab 11/20/15 2310  AST 17  ALT 13*  ALKPHOS 69  BILITOT 0.4  PROT 6.4*  ALBUMIN 3.1*    Recent Labs Lab 11/20/15 2310  LIPASE 47   No results for input(s): AMMONIA in the last 168 hours. Coagulation Profile: No results for input(s): INR, PROTIME in the last 168 hours. Cardiac Enzymes: No results for input(s): CKTOTAL, CKMB, CKMBINDEX, TROPONINI in the last 168 hours. BNP (last 3 results) No results for input(s): PROBNP in the last 8760 hours. HbA1C: No results for input(s): HGBA1C in the last 72 hours. CBG: No results for input(s): GLUCAP in the last 168 hours. Lipid Profile: No results for input(s): CHOL, HDL, LDLCALC, TRIG, CHOLHDL, LDLDIRECT in the last 72  hours. Thyroid Function Tests: No results for input(s): TSH, T4TOTAL, FREET4, T3FREE, THYROIDAB in the last 72 hours. Anemia Panel: No results for input(s): VITAMINB12, FOLATE, FERRITIN, TIBC, IRON, RETICCTPCT in the last 72 hours. Urine analysis:    Component Value Date/Time   COLORURINE YELLOW 11/21/2015 0020   APPEARANCEUR CLEAR 11/21/2015 0020   LABSPEC 1.026 11/21/2015 0020  PHURINE 5.5 11/21/2015 0020   GLUCOSEU NEGATIVE 11/21/2015 0020   HGBUR NEGATIVE 11/21/2015 0020   BILIRUBINUR SMALL* 11/21/2015 0020   KETONESUR 15* 11/21/2015 0020   PROTEINUR NEGATIVE 11/21/2015 0020   UROBILINOGEN 1.0 11/28/2014 0001   NITRITE NEGATIVE 11/21/2015 0020   LEUKOCYTESUR NEGATIVE 11/21/2015 0020   Sepsis Labs: @LABRCNTIP (procalcitonin:4,lacticidven:4) ) Recent Results (from the past 240 hour(s))  Wet prep, genital     Status: Abnormal   Collection Time: 11/21/15 12:15 AM  Result Value Ref Range Status   Yeast Wet Prep HPF POC NONE SEEN NONE SEEN Final   Trich, Wet Prep NONE SEEN NONE SEEN Final   Clue Cells Wet Prep HPF POC PRESENT (A) NONE SEEN Final   WBC, Wet Prep HPF POC NONE SEEN NONE SEEN Final   Sperm NONE SEEN  Final     Radiological Exams on Admission: Ct Abdomen Pelvis W Contrast  11/21/2015  CLINICAL DATA:  Acute onset of right lower quadrant abdominal pain and right flank pain. Periumbilical pain and lower back pain. Nausea, vomiting and hematuria. Initial encounter. EXAM: CT ABDOMEN AND PELVIS WITH CONTRAST TECHNIQUE: Multidetector CT imaging of the abdomen and pelvis was performed using the standard protocol following bolus administration of intravenous contrast. CONTRAST:  142mL ISOVUE-300 IOPAMIDOL (ISOVUE-300) INJECTION 61% COMPARISON:  CT of the abdomen and pelvis from 11/25/2014, and MRCP performed 12/21/2014 FINDINGS: The visualized lung bases are clear. The patient is status post sleeve gastrectomy. The liver and spleen are unremarkable in appearance. The patient is  status post cholecystectomy, with clips noted at the gallbladder fossa. The pancreas and left renal gland are unremarkable. A small 1.5 cm hypodensity is noted at the right adrenal gland. The kidneys are unremarkable in appearance. There is no evidence of hydronephrosis. No renal or ureteral stones are seen. No perinephric stranding is appreciated. No free fluid is identified. The small bowel is unremarkable in appearance. The stomach is within normal limits. No acute vascular abnormalities are seen. The appendix is normal in caliber, without evidence of appendicitis. Mild wall thickening is noted along the cecum, ascending and proximal transverse colon, raising concern for a mild acute infectious or inflammatory process. The remainder of the colon is unremarkable. The bladder is mildly distended and grossly unremarkable. The patient is status post hysterectomy. The ovaries are relatively symmetric. No suspicious adnexal masses are seen. No inguinal lymphadenopathy is seen. No acute osseous abnormalities are identified. IMPRESSION: 1. Mild wall thickening along the cecum, ascending and proximal transverse colon, raising concern for mild acute infectious or inflammatory process. 2. Small nonspecific 1.5 cm hypodensity at the right adrenal gland. This is relatively stable from 2016 and likely benign. Electronically Signed   By: Garald Balding M.D.   On: 11/21/2015 01:49    Assessment/Plan Principal Problem:   Infectious colitis -Acute onset of abdominal pain with CT findings consistent with colitis -No recent antibiotic usage so doubt C. difficile colitis but we'll check PCR for completeness of exam -No history of chronic recurrent or cyclic issues with abdominal pain with diarrhea and no blood in stool so doubt Crohn's disease at this juncture -Anticipate with symptomatic treatment symptoms should improve over the next 24 hours; if no improvement recommend consult GI-patient has seen [place] as an outpatient  in the past -Empiric Cipro and Flagyl IV initially -Clear liquids only -IV fluids for hydration -Symptom management with IV morphine, one-time dose IV Toradol (avoid NSAIDs in setting of history of gastric bypass), and IV anti emetics  Active Problems:  Bacterial vaginosis -Positive for clue cells -Flagyl as above    HTN (hypertension) -Patient reports controlled with exercise although old medication reconciliation shows patient to be on Benicar and Norvasc -Follow vital signs closely    Chronic asthmatic bronchitis  -Continue albuterol MDI and Flonase -DuoNeb every 4 hours when necessary wheezing or shortness of breath    Chronic low back pain -Patient reports sporadic use of oxycodone at home      DVT prophylaxis: Lovenox Code Status: Full code  Family Communication: Family member at bedside but was sleeping during initial encounter  Disposition Plan: Anticipate discharge to preadmission home environment once medically stable; will need no to return to work Consults called: None Admission status: Observation/medical floor    ELLIS,ALLISON L. ANP-BC [facility] Pager [phone]   If 7PM-7AM, please contact night-coverage [email or web address] Password [email or web address]  11/21/2015, 8:29 AM

## 2015-11-21 NOTE — Progress Notes (Signed)
40 yo F previously healthy, only previous history ovarian cyst.    Presents with 24 hours right sided abdominal pain, fever, N/V, diarrhea.    BP 143/87 mmHg  Pulse 95  Temp(Src) 99.2 F (37.3 C) (Oral)  Resp 20  Ht 6' (1.829 m)  Wt 99.791 kg (220 lb)  BMI 29.83 kg/m2  SpO2 97%  WBC 5.7K Na 140, K 3.5, Cr 0.8, LFTs normal CT shows proximal and transverse colitis without abscess or perforation, infectious versus inflammatory.  Dad with history of Crohn's.    Dr. Leonides Schanz gave Cipro/Flagyl, suspects infectious colitis.  Pain control difficult.  To med surg bed, OBS status for suspected infectious colitis.

## 2015-11-21 NOTE — ED Notes (Signed)
Pt ambulatory to bathroom without difficulty.  

## 2015-11-21 NOTE — ED Notes (Signed)
MD at bedside. 

## 2015-11-21 NOTE — ED Notes (Signed)
[facility] here for transport at this time.  

## 2015-11-22 DIAGNOSIS — J449 Chronic obstructive pulmonary disease, unspecified: Secondary | ICD-10-CM | POA: Diagnosis not present

## 2015-11-22 DIAGNOSIS — M545 Low back pain: Secondary | ICD-10-CM | POA: Diagnosis not present

## 2015-11-22 DIAGNOSIS — A09 Infectious gastroenteritis and colitis, unspecified: Secondary | ICD-10-CM | POA: Diagnosis not present

## 2015-11-22 DIAGNOSIS — G8929 Other chronic pain: Secondary | ICD-10-CM

## 2015-11-22 DIAGNOSIS — J45909 Unspecified asthma, uncomplicated: Secondary | ICD-10-CM | POA: Diagnosis not present

## 2015-11-22 LAB — COMPREHENSIVE METABOLIC PANEL
ALK PHOS: 64 U/L (ref 38–126)
ALT: 13 U/L — AB (ref 14–54)
ANION GAP: 7 (ref 5–15)
AST: 15 U/L (ref 15–41)
Albumin: 2.2 g/dL — ABNORMAL LOW (ref 3.5–5.0)
BUN: 6 mg/dL (ref 6–20)
CALCIUM: 7.8 mg/dL — AB (ref 8.9–10.3)
CO2: 23 mmol/L (ref 22–32)
CREATININE: 0.69 mg/dL (ref 0.44–1.00)
Chloride: 110 mmol/L (ref 101–111)
Glucose, Bld: 97 mg/dL (ref 65–99)
Potassium: 3.2 mmol/L — ABNORMAL LOW (ref 3.5–5.1)
SODIUM: 140 mmol/L (ref 135–145)
TOTAL PROTEIN: 4.7 g/dL — AB (ref 6.5–8.1)
Total Bilirubin: 0.4 mg/dL (ref 0.3–1.2)

## 2015-11-22 LAB — GC/CHLAMYDIA PROBE AMP (~~LOC~~) NOT AT ARMC
Chlamydia: NEGATIVE
Neisseria Gonorrhea: NEGATIVE

## 2015-11-22 LAB — MAGNESIUM: MAGNESIUM: 1.8 mg/dL (ref 1.7–2.4)

## 2015-11-22 MED ORDER — TRAZODONE HCL 50 MG PO TABS
50.0000 mg | ORAL_TABLET | Freq: Once | ORAL | Status: AC
  Administered 2015-11-22: 50 mg via ORAL
  Filled 2015-11-22: qty 1

## 2015-11-22 MED ORDER — POTASSIUM CHLORIDE IN NACL 40-0.9 MEQ/L-% IV SOLN
INTRAVENOUS | Status: DC
  Administered 2015-11-22: 100 mL/h via INTRAVENOUS
  Filled 2015-11-22 (×6): qty 1000

## 2015-11-22 NOTE — Progress Notes (Signed)
[facility] PROGRESS NOTE  [name] [id number] DOB: 11/20/1975 DOA: 11/20/2015   PCP: Philis Fendt, MD     Assessment/Plan: Principal Problem:   Infectious colitis Active Problems:   HTN (hypertension)   Chronic low back pain   Chronic asthmatic bronchitis (HCC)   Bacterial vaginosis  40yo woman with a history of HTN who presents with abdominal pain and nonbloody diarrhea. CT of abdomen and pelvis shows cecum and transverse colon wall thickening, suggestive of acute colitis, etiology unclear. There is a right adrenal incidentaloma; stable since 2016. She has a normal WBC count.Because of family history of Crohn's patient was concerned that the symptoms may represent new onset Crohn's disease.  CT abdomen and pelvis with contrast: Mild wall thickening along the cecum, ascending and proximal transverse colon raising concern for mild acute infectious or inflammatory process. Also a small nonspecific 1.5 cm hypodensity right adrenal gland stable since 2016  Assessment and plan Infectious colitis -Acute onset of abdominal pain with CT findings consistent with colitis -No recent antibiotic usage so doubt C. difficile colitis, stool studies pending -No history of chronic recurrent or cyclic issues with abdominal pain with diarrhea and no blood in stool so doubt Crohn's disease  , patient may need outpatient colonoscopy once colitis has resolved -Anticipate with symptomatic treatment symptoms should improve over the next 24 hours; if no improvement recommend consult GI-patient has seen [facility] as an outpatient in the past -Empiric Cipro and Flagyl IV , advance to full liquids Continue IV fluids and replete magnesium and potassium -Symptom management with IV morphine, one-time dose IV Toradol (avoid NSAIDs in setting of history of gastric bypass), and IV anti emetics     Bacterial vaginosis -Positive for clue cells -Flagyl as above   HTN  (hypertension) -Patient reports controlled with exercise although old medication reconciliation shows patient to be on Benicar and Norvasc -Follow vital signs closely   Chronic asthmatic bronchitis  -Continue albuterol MDI and Flonase -DuoNeb every 4 hours when necessary wheezing or shortness of breath   Chronic low back pain -Patient reports sporadic use of oxycodone at home      DVT prophylaxsis  Lovenox  Code Status:  Full code    Family Communication: Discussed in detail with the patient, all imaging results, lab results explained to the patient   Disposition Plan: Anticipate discharge tomorrow     Consultants:  None  Procedures:  None  Antibiotics: Anti-infectives    Start     Dose/Rate Route Frequency Ordered Stop   11/21/15 1800  ciprofloxacin (CIPRO) IVPB 400 mg     400 mg 200 mL/hr over 60 Minutes Intravenous Every 12 hours 11/21/15 0553     11/21/15 1200  metroNIDAZOLE (FLAGYL) IVPB 500 mg     500 mg 100 mL/hr over 60 Minutes Intravenous Every 8 hours 11/21/15 0553     11/21/15 0215  ciprofloxacin (CIPRO) IVPB 400 mg     400 mg 200 mL/hr over 60 Minutes Intravenous  Once 11/21/15 0203 11/21/15 0312   11/21/15 0215  metroNIDAZOLE (FLAGYL) IVPB 500 mg     500 mg 100 mL/hr over 60 Minutes Intravenous  Once 11/21/15 0203 11/21/15 0413         HPI/Subjective: No documented bowel movement since admission  Objective: Filed Vitals:   11/21/15 0446 11/21/15 1423 11/21/15 2237 11/22/15 0536  BP: 154/95 132/89 159/97 160/101  Pulse: 67 75 72 67  Temp: 97.7 F (36.5 C) 99 F (  37.2 C) 99.4 F (37.4 C) 99.1 F (37.3 C)  TempSrc: Oral Oral Oral Oral  Resp: 17 18 19 18   Height: 6' (1.829 m)     Weight: 104.146 kg (229 lb 9.6 oz)     SpO2: 99% 98% 96% 100%    Intake/Output Summary (Last 24 hours) at 11/22/15 1130 Last data filed at 11/22/15 0857  Gross per 24 hour  Intake    360 ml  Output   1500 ml  Net  -1140 ml     Exam:  Examination:  General exam: Appears calm and comfortable  Respiratory system: Clear to auscultation. Respiratory effort normal. Cardiovascular system: S1 & S2 heard, RRR. No JVD, murmurs, rubs, gallops or clicks. No pedal edema. Gastrointestinal system: Abdomen is nondistended, soft and nontender. No organomegaly or masses felt. Normal bowel sounds heard. Central nervous system: Alert and oriented. No focal neurological deficits. Extremities: Symmetric 5 x 5 power. Skin: No rashes, lesions or ulcers Psychiatry: Judgement and insight appear normal. Mood & affect appropriate.     Data Reviewed: I have personally reviewed following labs and imaging studies  Micro Results Recent Results (from the past 240 hour(s))  Wet prep, genital     Status: Abnormal   Collection Time: 11/21/15 12:15 AM  Result Value Ref Range Status   Yeast Wet Prep HPF POC NONE SEEN NONE SEEN Final   Trich, Wet Prep NONE SEEN NONE SEEN Final   Clue Cells Wet Prep HPF POC PRESENT (A) NONE SEEN Final   WBC, Wet Prep HPF POC NONE SEEN NONE SEEN Final   Sperm NONE SEEN  Final    Radiology Reports Ct Abdomen Pelvis W Contrast  11/21/2015  CLINICAL DATA:  Acute onset of right lower quadrant abdominal pain and right flank pain. Periumbilical pain and lower back pain. Nausea, vomiting and hematuria. Initial encounter. EXAM: CT ABDOMEN AND PELVIS WITH CONTRAST TECHNIQUE: Multidetector CT imaging of the abdomen and pelvis was performed using the standard protocol following bolus administration of intravenous contrast. CONTRAST:  170mL ISOVUE-300 IOPAMIDOL (ISOVUE-300) INJECTION 61% COMPARISON:  CT of the abdomen and pelvis from 11/25/2014, and MRCP performed 12/21/2014 FINDINGS: The visualized lung bases are clear. The patient is status post sleeve gastrectomy. The liver and spleen are unremarkable in appearance. The patient is status post cholecystectomy, with clips noted at the gallbladder fossa. The pancreas and  left renal gland are unremarkable. A small 1.5 cm hypodensity is noted at the right adrenal gland. The kidneys are unremarkable in appearance. There is no evidence of hydronephrosis. No renal or ureteral stones are seen. No perinephric stranding is appreciated. No free fluid is identified. The small bowel is unremarkable in appearance. The stomach is within normal limits. No acute vascular abnormalities are seen. The appendix is normal in caliber, without evidence of appendicitis. Mild wall thickening is noted along the cecum, ascending and proximal transverse colon, raising concern for a mild acute infectious or inflammatory process. The remainder of the colon is unremarkable. The bladder is mildly distended and grossly unremarkable. The patient is status post hysterectomy. The ovaries are relatively symmetric. No suspicious adnexal masses are seen. No inguinal lymphadenopathy is seen. No acute osseous abnormalities are identified. IMPRESSION: 1. Mild wall thickening along the cecum, ascending and proximal transverse colon, raising concern for mild acute infectious or inflammatory process. 2. Small nonspecific 1.5 cm hypodensity at the right adrenal gland. This is relatively stable from 2016 and likely benign. Electronically Signed   By: Francoise Schaumann.D.  On: 11/21/2015 01:49     CBC  Recent Labs Lab 11/20/15 2310  WBC 5.7  HGB 14.7  HCT 42.6  PLT 260  MCV 97.3  MCH 33.6  MCHC 34.5  RDW 12.7  LYMPHSABS 2.1  MONOABS 0.4  EOSABS 0.1  BASOSABS 0.0    Chemistries   Recent Labs Lab 11/20/15 2310 11/22/15 0451  NA 140 140  K 3.5 3.2*  CL 108 110  CO2 25 23  GLUCOSE 95 97  BUN 9 6  CREATININE 0.79 0.69  CALCIUM 8.2* 7.8*  AST 17 15  ALT 13* 13*  ALKPHOS 69 64  BILITOT 0.4 0.4   ------------------------------------------------------------------------------------------------------------------ estimated creatinine clearance is 126.2 mL/min (by C-G formula based on Cr of  0.69). ------------------------------------------------------------------------------------------------------------------ No results for input(s): HGBA1C in the last 72 hours. ------------------------------------------------------------------------------------------------------------------ No results for input(s): CHOL, HDL, LDLCALC, TRIG, CHOLHDL, LDLDIRECT in the last 72 hours. ------------------------------------------------------------------------------------------------------------------ No results for input(s): TSH, T4TOTAL, T3FREE, THYROIDAB in the last 72 hours.  Invalid input(s): FREET3 ------------------------------------------------------------------------------------------------------------------ No results for input(s): VITAMINB12, FOLATE, FERRITIN, TIBC, IRON, RETICCTPCT in the last 72 hours.  Coagulation profile No results for input(s): INR, PROTIME in the last 168 hours.  No results for input(s): DDIMER in the last 72 hours.  Cardiac Enzymes No results for input(s): CKMB, TROPONINI, MYOGLOBIN in the last 168 hours.  Invalid input(s): CK ------------------------------------------------------------------------------------------------------------------ Invalid input(s): POCBNP   CBG: No results for input(s): GLUCAP in the last 168 hours.     Studies: Ct Abdomen Pelvis W Contrast  11/21/2015  CLINICAL DATA:  Acute onset of right lower quadrant abdominal pain and right flank pain. Periumbilical pain and lower back pain. Nausea, vomiting and hematuria. Initial encounter. EXAM: CT ABDOMEN AND PELVIS WITH CONTRAST TECHNIQUE: Multidetector CT imaging of the abdomen and pelvis was performed using the standard protocol following bolus administration of intravenous contrast. CONTRAST:  145mL ISOVUE-300 IOPAMIDOL (ISOVUE-300) INJECTION 61% COMPARISON:  CT of the abdomen and pelvis from 11/25/2014, and MRCP performed 12/21/2014 FINDINGS: The visualized lung bases are clear. The  patient is status post sleeve gastrectomy. The liver and spleen are unremarkable in appearance. The patient is status post cholecystectomy, with clips noted at the gallbladder fossa. The pancreas and left renal gland are unremarkable. A small 1.5 cm hypodensity is noted at the right adrenal gland. The kidneys are unremarkable in appearance. There is no evidence of hydronephrosis. No renal or ureteral stones are seen. No perinephric stranding is appreciated. No free fluid is identified. The small bowel is unremarkable in appearance. The stomach is within normal limits. No acute vascular abnormalities are seen. The appendix is normal in caliber, without evidence of appendicitis. Mild wall thickening is noted along the cecum, ascending and proximal transverse colon, raising concern for a mild acute infectious or inflammatory process. The remainder of the colon is unremarkable. The bladder is mildly distended and grossly unremarkable. The patient is status post hysterectomy. The ovaries are relatively symmetric. No suspicious adnexal masses are seen. No inguinal lymphadenopathy is seen. No acute osseous abnormalities are identified. IMPRESSION: 1. Mild wall thickening along the cecum, ascending and proximal transverse colon, raising concern for mild acute infectious or inflammatory process. 2. Small nonspecific 1.5 cm hypodensity at the right adrenal gland. This is relatively stable from 2016 and likely benign. Electronically Signed   By: Garald Balding M.D.   On: 11/21/2015 01:49      No results found for: HGBA1C Lab Results  Component Value Date   LDLCALC 48 11/29/2014  CREATININE 0.69 11/22/2015       Scheduled Meds: . ciprofloxacin  400 mg Intravenous Q12H  . enoxaparin (LOVENOX) injection  40 mg Subcutaneous Q24H  . fluticasone  2 spray Each Nare Daily  . metronidazole  500 mg Intravenous Q8H   Continuous Infusions:       Time spent: >30 MINS    [facility]  [facility] Pager  [phone]. If 7PM-7AM, please contact night-coverage at [email or web address], password [facility] 11/22/2015, 11:30 AM

## 2015-11-23 DIAGNOSIS — A09 Infectious gastroenteritis and colitis, unspecified: Secondary | ICD-10-CM

## 2015-11-23 LAB — GASTROINTESTINAL PANEL BY PCR, STOOL (REPLACES STOOL CULTURE)
ADENOVIRUS F40/41: NOT DETECTED
ASTROVIRUS: NOT DETECTED
CRYPTOSPORIDIUM: NOT DETECTED
CYCLOSPORA CAYETANENSIS: NOT DETECTED
Campylobacter species: NOT DETECTED
E. coli O157: NOT DETECTED
ENTAMOEBA HISTOLYTICA: NOT DETECTED
Enteroaggregative E coli (EAEC): NOT DETECTED
Enteropathogenic E coli (EPEC): NOT DETECTED
Enterotoxigenic E coli (ETEC): NOT DETECTED
Giardia lamblia: NOT DETECTED
Norovirus GI/GII: NOT DETECTED
Plesimonas shigelloides: NOT DETECTED
ROTAVIRUS A: NOT DETECTED
SAPOVIRUS (I, II, IV, AND V): NOT DETECTED
SHIGA LIKE TOXIN PRODUCING E COLI (STEC): NOT DETECTED
Salmonella species: DETECTED — AB
Shigella/Enteroinvasive E coli (EIEC): NOT DETECTED
VIBRIO CHOLERAE: NOT DETECTED
VIBRIO SPECIES: NOT DETECTED
YERSINIA ENTEROCOLITICA: NOT DETECTED

## 2015-11-23 LAB — CBC
HEMATOCRIT: 39.2 % (ref 36.0–46.0)
HEMOGLOBIN: 12.7 g/dL (ref 12.0–15.0)
MCH: 31.9 pg (ref 26.0–34.0)
MCHC: 32.4 g/dL (ref 30.0–36.0)
MCV: 98.5 fL (ref 78.0–100.0)
Platelets: 242 10*3/uL (ref 150–400)
RBC: 3.98 MIL/uL (ref 3.87–5.11)
RDW: 12.7 % (ref 11.5–15.5)
WBC: 3.6 10*3/uL — AB (ref 4.0–10.5)

## 2015-11-23 LAB — COMPREHENSIVE METABOLIC PANEL
ALBUMIN: 2.4 g/dL — AB (ref 3.5–5.0)
ALK PHOS: 65 U/L (ref 38–126)
ALT: 13 U/L — AB (ref 14–54)
ANION GAP: 3 — AB (ref 5–15)
AST: 17 U/L (ref 15–41)
CALCIUM: 8 mg/dL — AB (ref 8.9–10.3)
CO2: 24 mmol/L (ref 22–32)
Chloride: 113 mmol/L — ABNORMAL HIGH (ref 101–111)
Creatinine, Ser: 0.66 mg/dL (ref 0.44–1.00)
GFR calc Af Amer: >60 mL/min (ref >60)
GFR calc non Af Amer: >60 mL/min (ref >60)
GLUCOSE: 80 mg/dL (ref 65–99)
Potassium: 3.7 mmol/L (ref 3.5–5.1)
SODIUM: 140 mmol/L (ref 135–145)
Total Bilirubin: 0.3 mg/dL (ref 0.3–1.2)
Total Protein: 5.2 g/dL — ABNORMAL LOW (ref 6.5–8.1)

## 2015-11-23 LAB — C DIFFICILE QUICK SCREEN W PCR REFLEX
C DIFFICILE (CDIFF) TOXIN: NEGATIVE
C DIFFICLE (CDIFF) ANTIGEN: NEGATIVE
C Diff interpretation: NOT DETECTED

## 2015-11-23 MED ORDER — CIPROFLOXACIN HCL 500 MG PO TABS: 500.0000 mg | ORAL_TABLET | Freq: Two times a day (BID) | ORAL | Status: DC

## 2015-11-23 MED ORDER — SACCHAROMYCES BOULARDII 250 MG PO CAPS: 250.0000 mg | ORAL_CAPSULE | Freq: Two times a day (BID) | ORAL | Status: DC

## 2015-11-23 MED ORDER — AMLODIPINE BESYLATE 10 MG PO TABS
10.0000 mg | ORAL_TABLET | Freq: Every day | ORAL | Status: DC
  Administered 2015-11-23: 10 mg via ORAL
  Filled 2015-11-23: qty 1

## 2015-11-23 MED ORDER — METRONIDAZOLE 500 MG PO TABS: 500.0000 mg | ORAL_TABLET | Freq: Three times a day (TID) | ORAL | Status: DC

## 2015-11-23 MED ORDER — IRBESARTAN 300 MG PO TABS
300.0000 mg | ORAL_TABLET | Freq: Every day | ORAL | Status: DC
  Administered 2015-11-23: 300 mg via ORAL
  Filled 2015-11-23: qty 1

## 2015-11-23 NOTE — Progress Notes (Signed)
Pt. States she feels fine and is ready to go home. She ate about half of her lunch and states no nausea and that she tolerated it well. MD notified.

## 2015-11-23 NOTE — Progress Notes (Signed)
Notifed by lab of critical lab value of GI PCR + for salmonella. Pt. Had already been discharged. NOtified MD. No changes.

## 2015-11-23 NOTE — Discharge Summary (Signed)
[name], is a 40 y.o. female  DOB May 14, 1976  MRN [id number].  Admission date:  11/20/2015  Admitting Physician  Alberteen Sam, MD  Discharge Date:  11/23/2015   Primary MD  Dorrene German, MD  Recommendations for primary care physician for things to follow:  - Patient will need further workup as an outpatient regarding small nonspecific 1.5 cm hypodensity right adrenal gland stable since 2016. Especially in the setting of hypertension.   Admission Diagnosis  Colitis [K52.9] Nausea vomiting and diarrhea [R11.2, R19.7] Intractable abdominal pain [R10.9]   Discharge Diagnosis  Colitis [K52.9] Nausea vomiting and diarrhea [R11.2, R19.7] Intractable abdominal pain [R10.9]    Principal Problem:   Infectious colitis Active Problems:   HTN (hypertension)   Chronic low back pain   Chronic asthmatic bronchitis (HCC)   Bacterial vaginosis      Past Medical History  Diagnosis Date  . Anemia 2015    "pernicious" and iron deficiency.  as of 2015/2016 no longer requiring po iron or B12 shots.   . Hypertension   . Headache(784.0)     migraines  . Nausea & vomiting 11/28/2014  . Hydradenitis 2001    axillary, multiple surgical I & Ds/excisions.  . Obesity (BMI 30.0-34.9)   . Chondromalacia 2005    left knee. arthroscopy x 2.   . Adnexal cyst 2013    right  . Pityriasis rosea     pt also gives hx of seborrheic dermatitis.   . Colitis     Past Surgical History  Procedure Laterality Date  . Partial hysterectomy    . Laparoscopic cholecystectomy  2002  . Ovarian cyst removal    . R arm surgery    . Gastric bypass  2011    gastric sleeve  . Knee surgery  2005    left  . Cystectomy      2004 axilla bil  . Abdominal hysterectomy    . Medial patellofemoral ligament repair Left 06/08/2013    Procedure: DIAGNOSTIC OPERATIVE ARTHROSCOPY, OPEN MEDIAL PATELLA FEMORAL LIGAMENT  RECONSTRUCTION;  Surgeon: Cammy Copa, MD;  Location: [facility] OR;  Service: Orthopedics;  Laterality: Left;  with Hamstring autograft  . Fracture surgery      rt arm  . Esophagogastroduodenoscopy N/A 11/30/2014    Procedure: ESOPHAGOGASTRODUODENOSCOPY (EGD);  Surgeon: Beverley Fiedler, MD;  Location: [facility] ENDOSCOPY;  Service: Endoscopy;  Laterality: N/A;       History of present illness and  Hospital Course:     Kindly see H&P for history of present illness and admission details, please review complete Labs, Consult reports and Test reports for all details in brief  HPI  from the history and physical done on the day of admission 11/21/2015.  HPI: [name] is a 40 y.o. female with medical history significant for hypertension reported as controlled with exercise by patient, chronic asthmatic bronchitis and chronic low back pain who presents to the hospital with progressive abdominal pain beginning this past Saturday, July 1. Patient reports  the pain began as her typical low back pain not resolved with her prescribed and over-the-counter medications. Later she develop periumbilical pain that eventually radiated more to the right side right middle and lower quadrants. The pain was crampy in nature and was associated by nonbloody loose stools 4-5 per day. She attempted to eat this would increase her right-sided pain and cause vomiting. Because of family history of Crohn's patient was concerned that the symptoms may represent new onset Crohn's disease.   ED Course:  Vital signs: PO temp 99.2-BP 143/87-pulse 95 - respirations 20-RA saturations 97% CT abdomen and pelvis with contrast: Mild wall thickening along the cecum, ascending and proximal transverse colon raising concern for mild acute infectious or inflammatory process. Also a small nonspecific 1.5 cm hypodensity right adrenal gland stable since 2016 Lab data: Sodium 140, potassium 3.5, BUN 9, creatinine 0.79, albumin 3.1, LFTs not elevated,  WBC 5700 with normal differential, hemoglobin 14.7, platelets 260,000, wet prep positive for clue cells, urinalysis unremarkable except for 15 ketones and small amount of bilirubin; urine culture not obtained Medications and treatment: Zofran 4 mg IV 1, morphine 4 mg IV 1, 2 L normal saline bolus, Dilaudid 1 mg IV 2 doses, Cipro 400 mg IV 1, Flagyl 500 mg IV 1  Hospital Course    Infectious colitis -Acute onset of abdominal pain with CT findings consistent with colitis -No recent antibiotic usage so doubt C. difficile colitis, C. difficile is negative -Empiric Cipro and Flagyl IV , initially clear liquid diet, advance to regular diet today, which she tolerated very well, discharged on by mouth Cipro and Flagyl to finish another 5 days as an outpatient  Bacterial vaginosis -Positive for clue cells -Flagyl as above  HTN (hypertension) -Agent reports she is on Benicar at home, not using Norvasc anymore, resumed on Benicar, giving her and adrenal incidental [place], but he likely will need aldosteron /renin  level as an outpatient, was discussed with the patient, she knows about the importance of the follow-up, will forward her discharge summary to [facility] as reports they are her a new primary.  Chronic asthmatic bronchitis  -Continue with home meds   Chronic low back pain -Patient reports sporadic use of oxycodone at home  small nonspecific 1.5 cm hypodensity right adrenal gland stable since 2016. -  Patient will need further workup as an outpatient ,  Especially in the setting of hypertension. - Discussed with patient, she was about the importance of the follow-up,   Discharge Condition: Stable   Follow UP  Follow-up Information    Follow up with [facility]. Schedule an appointment as soon as possible for a visit in 1 week.   Contact information:   [address] [place] [address] [phone]         Discharge  Instructions  and  Discharge Medications         Discharge Instructions    Discharge instructions    Complete by:  As directed   Follow with Primary MD Dorrene German, MD in 7 days   Get CBC, CMP,  checked  by Primary MD next visit.    Activity: As tolerated with Full fall precautions use walker/cane & assistance as needed   Disposition Home    Diet: Heart Healthy *, with feeding assistance and aspiration precautions.  For Heart failure patients - Check your Weight same time everyday, if you gain over 2 pounds, or you develop in leg swelling,  experience more shortness of breath or chest pain, call your Primary MD immediately. Follow Cardiac Low Salt Diet and 1.5 lit/day fluid restriction.   On your next visit with your primary care physician please Get Medicines reviewed and adjusted.   Please request your Prim.MD to go over all Hospital Tests and Procedure/Radiological results at the follow up, please get all Hospital records sent to your Prim MD by signing hospital release before you go home.   If you experience worsening of your admission symptoms, develop shortness of breath, life threatening emergency, suicidal or homicidal thoughts you must seek medical attention immediately by calling 911 or calling your MD immediately  if symptoms less severe.  You Must read complete instructions/literature along with all the possible adverse reactions/side effects for all the Medicines you take and that have been prescribed to you. Take any new Medicines after you have completely understood and accpet all the possible adverse reactions/side effects.   Do not drive, operating heavy machinery, perform activities at heights, swimming or participation in water activities or provide baby sitting services if your were admitted for syncope or siezures until you have seen by Primary MD or a Neurologist and advised to do so again.  Do not drive when taking Pain medications.    Do not take more  than prescribed Pain, Sleep and Anxiety Medications  Special Instructions: If you have smoked or chewed Tobacco  in the last 2 yrs please stop smoking, stop any regular Alcohol  and or any Recreational drug use.  Wear Seat belts while driving.   Please note  You were cared for by a hospitalist during your hospital stay. If you have any questions about your discharge medications or the care you received while you were in the hospital after you are discharged, you can call the unit and asked to speak with the hospitalist on call if the hospitalist that took care of you is not available. Once you are discharged, your primary care physician will handle any further medical issues. Please note that NO REFILLS for any discharge medications will be authorized once you are discharged, as it is imperative that you return to your primary care physician (or establish a relationship with a primary care physician if you do not have one) for your aftercare needs so that they can reassess your need for medications and monitor your lab values.     Increase activity slowly    Complete by:  As directed             Medication List    STOP taking these medications        loratadine 10 MG tablet  Commonly known as:  CLARITIN      TAKE these medications        albuterol 108 (90 Base) MCG/ACT inhaler  Commonly known as:  PROVENTIL HFA;VENTOLIN HFA  Inhale 2 puffs into the lungs every 6 (six) hours as needed (chronic bronchitis). ProAir     ciprofloxacin 500 MG tablet  Commonly known as:  CIPRO  Take 1 tablet (500 mg total) by mouth 2 (two) times daily. Take for 5 days     fluticasone 50 MCG/ACT nasal spray  Commonly known as:  FLONASE  Place 2 sprays into both nostrils daily.     metroNIDAZOLE 500 MG tablet  Commonly known as:  FLAGYL  Take 1 tablet (500 mg total) by mouth 3 (three) times daily. Please take for 5 days     olmesartan 40 MG tablet  Commonly known as:  BENICAR  Take 40 mg by mouth  daily.     Oxycodone HCl 20 MG Tabs  Take 20 mg by mouth 3 (three) times daily as needed (severe pain).     saccharomyces boulardii 250 MG capsule  Commonly known as:  FLORASTOR  Take 1 capsule (250 mg total) by mouth 2 (two) times daily.     traZODone 100 MG tablet  Commonly known as:  DESYREL  TAKE 1 TABLET BY MOUTH DAILY AT BEDTIME FOR SLEEP     vitamin B-12 1000 MCG tablet  Commonly known as:  CYANOCOBALAMIN  Take 1,000 mcg by mouth daily.          Diet and Activity recommendation: See Discharge Instructions above   Consults obtained -  None   Major procedures and Radiology Reports - PLEASE review detailed and final reports for all details, in brief -      Ct Abdomen Pelvis W Contrast  11/21/2015  CLINICAL DATA:  Acute onset of right lower quadrant abdominal pain and right flank pain. Periumbilical pain and lower back pain. Nausea, vomiting and hematuria. Initial encounter. EXAM: CT ABDOMEN AND PELVIS WITH CONTRAST TECHNIQUE: Multidetector CT imaging of the abdomen and pelvis was performed using the standard protocol following bolus administration of intravenous contrast. CONTRAST:  ISOVUE-300 IOPAMIDOL (ISOVUE-300) INJECTION 61% COMPARISON:  CT of the abdomen and pelvis from 11/25/2014, and MRCP performed 12/21/2014 FINDINGS: The visualized lung bases are clear. The patient is status post sleeve gastrectomy. The liver and spleen are unremarkable in appearance. The patient is status post cholecystectomy, with clips noted at the gallbladder fossa. The pancreas and left renal gland are unremarkable. A small 1.5 cm hypodensity is noted at the right adrenal gland. The kidneys are unremarkable in appearance. There is no evidence of hydronephrosis. No renal or ureteral stones are seen. No perinephric stranding is appreciated. No free fluid is identified. The small bowel is unremarkable in appearance. The stomach is within normal limits. No acute vascular abnormalities are seen.  The appendix is normal in caliber, without evidence of appendicitis. Mild wall thickening is noted along the cecum, ascending and proximal transverse colon, raising concern for a mild acute infectious or inflammatory process. The remainder of the colon is unremarkable. The bladder is mildly distended and grossly unremarkable. The patient is status post hysterectomy. The ovaries are relatively symmetric. No suspicious adnexal masses are seen. No inguinal lymphadenopathy is seen. No acute osseous abnormalities are identified. IMPRESSION: 1. Mild wall thickening along the cecum, ascending and proximal transverse colon, raising concern for mild acute infectious or inflammatory process. 2. Small nonspecific 1.5 cm hypodensity at the right adrenal gland. This is relatively stable from 2016 and likely benign. Electronically Signed   By: Roanna Raider M.D.   On: 11/21/2015 01:49    Micro Results   Recent Results (from the past 240 hour(s))  Wet prep, genital     Status: Abnormal   Collection Time: 11/21/15 12:15 AM  Result Value Ref Range Status   Yeast Wet Prep HPF POC NONE SEEN NONE SEEN Final   Trich, Wet Prep NONE SEEN NONE SEEN Final   Clue Cells Wet Prep HPF POC PRESENT (A) NONE SEEN Final   WBC, Wet Prep HPF POC NONE SEEN NONE SEEN Final   Sperm NONE SEEN  Final  C difficile quick scan w PCR reflex     Status: None   Collection Time: 11/22/15 10:19 PM  Result Value Ref Range Status  C Diff antigen NEGATIVE NEGATIVE Final   C Diff toxin NEGATIVE NEGATIVE Final   C Diff interpretation No C. difficile detected.  Final       Today   Subjective:   [name] today has no headache,no chest or abdominal pain,Tolerating regular diet, the area significantly subsided . Objective:   Blood pressure 172/104, pulse 66, temperature 98.7 F (37.1 C), temperature source Oral, resp. rate 18, height 6' (1.829 m), weight 104.146 kg (229 lb 9.6 oz), SpO2 99 %.   Intake/Output Summary (Last 24  hours) at 11/23/15 1206 Last data filed at 11/23/15 0852  Gross per 24 hour  Intake   2825 ml  Output    450 ml  Net   2375 ml    Exam Awake Alert, Oriented x 3, No new F.N deficits, Normal affect [place].AT,PERRAL Supple Neck,No JVD, No cervical lymphadenopathy appriciated.  Symmetrical Chest wall movement, Good air movement bilaterally, CTAB RRR,No Gallops,Rubs or new Murmurs, No Parasternal Heave +ve B.Sounds, Abd Soft, Non tender, No organomegaly appriciated, No rebound -guarding or rigidity. No Cyanosis, Clubbing or edema, No new Rash or bruise  Data Review   CBC w Diff:  Lab Results  Component Value Date   WBC 3.6* 11/23/2015   HGB 12.7 11/23/2015   HCT 39.2 11/23/2015   PLT 242 11/23/2015   LYMPHOPCT 37 11/20/2015   MONOPCT 8 11/20/2015   EOSPCT 1 11/20/2015   BASOPCT 0 11/20/2015    CMP:  Lab Results  Component Value Date   NA 140 11/23/2015   K 3.7 11/23/2015   CL 113* 11/23/2015   CO2 24 11/23/2015   BUN <5* 11/23/2015   CREATININE 0.66 11/23/2015   PROT 5.2* 11/23/2015   ALBUMIN 2.4* 11/23/2015   BILITOT 0.3 11/23/2015   ALKPHOS 65 11/23/2015   AST 17 11/23/2015   ALT 13* 11/23/2015  .   Total Time in preparing paper work, data evaluation and todays exam - 35 minutes  Anae Hams M.D on 11/23/2015 at 12:06 PM  [facility]   Office  [phone]

## 2015-11-23 NOTE — Progress Notes (Signed)
Discharge papers gone over with pt. Prescriptions given to pt. Work note given to pt. Pt. States no questions/complaints. IV taken out. Pt. D/c'd successfully.

## 2015-11-23 NOTE — Progress Notes (Addendum)
Just received updated lab alert via EMR (I was ordering provider at time of admission). Stool studies were POSITIVE for salmonella. Pt treated with Cipro and FLAgyl here and was dc'd on these same medications. Please see dc summary for details.  Erin Hearing, ANP

## 2015-11-23 NOTE — Discharge Instructions (Signed)
Follow with Primary MD Edwin A Avbuere, MD in 7 days  ° °Get CBC, CMP,checked  by Primary MD next visit.  ° ° °Activity: As tolerated with Full fall precautions use walker/cane & assistance as needed ° ° °Disposition Home  ° ° °Diet: Heart Healthy  , with feeding assistance and aspiration precautions. ° °For Heart failure patients - Check your Weight same time everyday, if you gain over 2 pounds, or you develop in leg swelling, experience more shortness of breath or chest pain, call your Primary MD immediately. Follow Cardiac Low Salt Diet and 1.5 lit/day fluid restriction. ° ° °On your next visit with your primary care physician please Get Medicines reviewed and adjusted. ° ° °Please request your Prim.MD to go over all Hospital Tests and Procedure/Radiological results at the follow up, please get all Hospital records sent to your Prim MD by signing hospital release before you go home. ° ° °If you experience worsening of your admission symptoms, develop shortness of breath, life threatening emergency, suicidal or homicidal thoughts you must seek medical attention immediately by calling 911 or calling your MD immediately  if symptoms less severe. ° °You Must read complete instructions/literature along with all the possible adverse reactions/side effects for all the Medicines you take and that have been prescribed to you. Take any new Medicines after you have completely understood and accpet all the possible adverse reactions/side effects.  ° °Do not drive, operating heavy machinery, perform activities at heights, swimming or participation in water activities or provide baby sitting services if your were admitted for syncope or siezures until you have seen by Primary MD or a Neurologist and advised to do so again. ° °Do not drive when taking Pain medications.  ° ° °Do not take more than prescribed Pain, Sleep and Anxiety Medications ° °Special Instructions: If you have smoked or chewed Tobacco  in the last 2 yrs  please stop smoking, stop any regular Alcohol  and or any Recreational drug use. ° °Wear Seat belts while driving. ° ° °Please note ° °You were cared for by a hospitalist during your hospital stay. If you have any questions about your discharge medications or the care you received while you were in the hospital after you are discharged, you can call the unit and asked to speak with the hospitalist on call if the hospitalist that took care of you is not available. Once you are discharged, your primary care physician will handle any further medical issues. Please note that NO REFILLS for any discharge medications will be authorized once you are discharged, as it is imperative that you return to your primary care physician (or establish a relationship with a primary care physician if you do not have one) for your aftercare needs so that they can reassess your need for medications and monitor your lab values. ° °

## 2015-12-05 ENCOUNTER — Emergency Department (HOSPITAL_COMMUNITY): Admission: EM | Admit: 2015-12-05 | Discharge status: Absent without leave | Payer: Commercial Managed Care - HMO

## 2015-12-05 ENCOUNTER — Emergency Department (HOSPITAL_COMMUNITY): Payer: Commercial Managed Care - HMO

## 2015-12-05 ENCOUNTER — Encounter (HOSPITAL_COMMUNITY): Payer: Self-pay | Admitting: Emergency Medicine

## 2015-12-05 ENCOUNTER — Emergency Department (HOSPITAL_COMMUNITY)
Admission: EM | Admit: 2015-12-05 | Discharge: 2015-12-05 | Disposition: A | Discharge status: Home / Self Care | Payer: Commercial Managed Care - HMO | Attending: Emergency Medicine | Admitting: Emergency Medicine

## 2015-12-05 DIAGNOSIS — I10 Essential (primary) hypertension: Secondary | ICD-10-CM | POA: Insufficient documentation

## 2015-12-05 DIAGNOSIS — R404 Transient alteration of awareness: Secondary | ICD-10-CM

## 2015-12-05 DIAGNOSIS — F17210 Nicotine dependence, cigarettes, uncomplicated: Secondary | ICD-10-CM | POA: Diagnosis not present

## 2015-12-05 DIAGNOSIS — Z7982 Long term (current) use of aspirin: Secondary | ICD-10-CM | POA: Insufficient documentation

## 2015-12-05 DIAGNOSIS — R0789 Other chest pain: Secondary | ICD-10-CM | POA: Diagnosis not present

## 2015-12-05 DIAGNOSIS — Z79899 Other long term (current) drug therapy: Secondary | ICD-10-CM | POA: Diagnosis not present

## 2015-12-05 DIAGNOSIS — R079 Chest pain, unspecified: Secondary | ICD-10-CM | POA: Diagnosis present

## 2015-12-05 DIAGNOSIS — R4182 Altered mental status, unspecified: Secondary | ICD-10-CM | POA: Diagnosis not present

## 2015-12-05 DIAGNOSIS — Z7951 Long term (current) use of inhaled steroids: Secondary | ICD-10-CM | POA: Diagnosis not present

## 2015-12-05 LAB — CBC
HCT: 44.5 % (ref 36.0–46.0)
HEMOGLOBIN: 15.4 g/dL — AB (ref 12.0–15.0)
MCH: 33 pg (ref 26.0–34.0)
MCHC: 34.6 g/dL (ref 30.0–36.0)
MCV: 95.3 fL (ref 78.0–100.0)
Platelets: 360 10*3/uL (ref 150–400)
RBC: 4.67 MIL/uL (ref 3.87–5.11)
RDW: 13 % (ref 11.5–15.5)
WBC: 7.1 10*3/uL (ref 4.0–10.5)

## 2015-12-05 LAB — BASIC METABOLIC PANEL
Anion gap: 7 (ref 5–15)
BUN: 9 mg/dL (ref 6–20)
CHLORIDE: 107 mmol/L (ref 101–111)
CO2: 28 mmol/L (ref 22–32)
Calcium: 8.5 mg/dL — ABNORMAL LOW (ref 8.9–10.3)
Creatinine, Ser: 1 mg/dL (ref 0.44–1.00)
GFR calc non Af Amer: >60 mL/min (ref >60)
GLUCOSE: 110 mg/dL — AB (ref 65–99)
Potassium: 3.1 mmol/L — ABNORMAL LOW (ref 3.5–5.1)
Sodium: 142 mmol/L (ref 135–145)

## 2015-12-05 LAB — I-STAT TROPONIN, ED: Troponin i, poc: 0.02 ng/mL (ref 0.00–0.08)

## 2015-12-05 LAB — D-DIMER, QUANTITATIVE: D-Dimer, Quant: 0.4 ug/mL-FEU (ref 0.00–0.50)

## 2015-12-05 LAB — MAGNESIUM: MAGNESIUM: 1.7 mg/dL (ref 1.7–2.4)

## 2015-12-05 MED ORDER — PROCHLORPERAZINE EDISYLATE 5 MG/ML IJ SOLN
10.0000 mg | Freq: Once | INTRAMUSCULAR | Status: AC
  Administered 2015-12-05: 10 mg via INTRAVENOUS
  Filled 2015-12-05: qty 2

## 2015-12-05 MED ORDER — SODIUM CHLORIDE 0.9 % IV BOLUS (SEPSIS)
1000.0000 mL | Freq: Once | INTRAVENOUS | Status: AC
  Administered 2015-12-05: 1000 mL via INTRAVENOUS

## 2015-12-05 MED ORDER — METHYLPREDNISOLONE SODIUM SUCC 125 MG IJ SOLR
125.0000 mg | Freq: Once | INTRAMUSCULAR | Status: AC
  Administered 2015-12-05: 125 mg via INTRAVENOUS
  Filled 2015-12-05: qty 2

## 2015-12-05 MED ORDER — POTASSIUM CHLORIDE CRYS ER 20 MEQ PO TBCR: 20.0000 meq | EXTENDED_RELEASE_TABLET | Freq: Every day | ORAL | Status: DC

## 2015-12-05 MED ORDER — DIPHENHYDRAMINE HCL 25 MG PO CAPS: 50.0000 mg | ORAL_CAPSULE | Freq: Once | ORAL | Status: DC

## 2015-12-05 MED ORDER — DIPHENHYDRAMINE HCL 50 MG/ML IJ SOLN
25.0000 mg | Freq: Once | INTRAMUSCULAR | Status: AC
  Administered 2015-12-05: 25 mg via INTRAVENOUS
  Filled 2015-12-05: qty 1

## 2015-12-05 NOTE — ED Notes (Signed)
Pt became very anxious.  Stated that she was going to pull her IV out if I did not take it out.  IV removed.  Order received for 50 mg benadryl.  Pt refused.  Pt has a ride and was given her discharge papers in the hallway. Unable to sign.

## 2015-12-05 NOTE — ED Notes (Signed)
Pt sent for further evaluation of chest pain. Pt had bloodwork done yesterday which showed an elevated troponin. CP began on Sunday. Pain is sometimes on R side, central or L side. Today pain is on L side with radiation down arm. No SOB or lightheadedness.

## 2015-12-05 NOTE — Discharge Instructions (Signed)
Do not drive, swim, or perform any activity that could be dangerous if you had a seizure while you are performing it. Do not take showers or baths without having someone close to you.   Follow closely with Neurology.  Please follow with your primary care doctor in the next 2 days for a check-up. They must obtain records for further management.   Do not hesitate to return to the Emergency Department for any new, worsening or concerning symptoms.    Nonspecific Chest Pain  Chest pain can be caused by many different conditions. There is always a chance that your pain could be related to something serious, such as a heart attack or a blood clot in your lungs. Chest pain can also be caused by conditions that are not life-threatening. If you have chest pain, it is very important to follow up with your health care provider. CAUSES  Chest pain can be caused by:  Heartburn.  Pneumonia or bronchitis.  Anxiety or stress.  Inflammation around your heart (pericarditis) or lung (pleuritis or pleurisy).  A blood clot in your lung.  A collapsed lung (pneumothorax). It can develop suddenly on its own (spontaneous pneumothorax) or from trauma to the chest.  Shingles infection (varicella-zoster virus).  Heart attack.  Damage to the bones, muscles, and cartilage that make up your chest wall. This can include:  Bruised bones due to injury.  Strained muscles or cartilage due to frequent or repeated coughing or overwork.  Fracture to one or more ribs.  Sore cartilage due to inflammation (costochondritis). RISK FACTORS  Risk factors for chest pain may include:  Activities that increase your risk for trauma or injury to your chest.  Respiratory infections or conditions that cause frequent coughing.  Medical conditions or overeating that can cause heartburn.  Heart disease or family history of heart disease.  Conditions or health behaviors that increase your risk of developing a blood  clot.  Having had chicken pox (varicella zoster). SIGNS AND SYMPTOMS Chest pain can feel like:  Burning or tingling on the surface of your chest or deep in your chest.  Crushing, pressure, aching, or squeezing pain.  Dull or sharp pain that is worse when you move, cough, or take a deep breath.  Pain that is also felt in your back, neck, shoulder, or arm, or pain that spreads to any of these areas. Your chest pain may come and go, or it may stay constant. DIAGNOSIS Lab tests or other studies may be needed to find the cause of your pain. Your health care provider may have you take a test called an ambulatory ECG (electrocardiogram). An ECG records your heartbeat patterns at the time the test is performed. You may also have other tests, such as:  Transthoracic echocardiogram (TTE). During echocardiography, sound waves are used to create a picture of all of the heart structures and to look at how blood flows through your heart.  Transesophageal echocardiogram (TEE).This is a more advanced imaging test that obtains images from inside your body. It allows your health care provider to see your heart in finer detail.  Cardiac monitoring. This allows your health care provider to monitor your heart rate and rhythm in real time.  Holter monitor. This is a portable device that records your heartbeat and can help to diagnose abnormal heartbeats. It allows your health care provider to track your heart activity for several days, if needed.  Stress tests. These can be done through exercise or by taking medicine that makes  your heart beat more quickly.  Blood tests.  Imaging tests. TREATMENT  Your treatment depends on what is causing your chest pain. Treatment may include:  Medicines. These may include:  Acid blockers for heartburn.  Anti-inflammatory medicine.  Pain medicine for inflammatory conditions.  Antibiotic medicine, if an infection is present.  Medicines to dissolve blood  clots.  Medicines to treat coronary artery disease.  Supportive care for conditions that do not require medicines. This may include:  Resting.  Applying heat or cold packs to injured areas.  Limiting activities until pain decreases. HOME CARE INSTRUCTIONS  If you were prescribed an antibiotic medicine, finish it all even if you start to feel better.  Avoid any activities that bring on chest pain.  Do not use any tobacco products, including cigarettes, chewing tobacco, or electronic cigarettes. If you need help quitting, ask your health care provider.  Do not drink alcohol.  Take medicines only as directed by your health care provider.  Keep all follow-up visits as directed by your health care provider. This is important. This includes any further testing if your chest pain does not go away.  If heartburn is the cause for your chest pain, you may be told to keep your head raised (elevated) while sleeping. This reduces the chance that acid will go from your stomach into your esophagus.  Make lifestyle changes as directed by your health care provider. These may include:  Getting regular exercise. Ask your health care provider to suggest some activities that are safe for you.  Eating a heart-healthy diet. A registered dietitian can help you to learn healthy eating options.  Maintaining a healthy weight.  Managing diabetes, if necessary.  Reducing stress. SEEK MEDICAL CARE IF:  Your chest pain does not go away after treatment.  You have a rash with blisters on your chest.  You have a fever. SEEK IMMEDIATE MEDICAL CARE IF:   Your chest pain is worse.  You have an increasing cough, or you cough up blood.  You have severe abdominal pain.  You have severe weakness.  You faint.  You have chills.  You have sudden, unexplained chest discomfort.  You have sudden, unexplained discomfort in your arms, back, neck, or jaw.  You have shortness of breath at any  time.  You suddenly start to sweat, or your skin gets clammy.  You feel nauseous or you vomit.  You suddenly feel light-headed or dizzy.  Your heart begins to beat quickly, or it feels like it is skipping beats. These symptoms may represent a serious problem that is an emergency. Do not wait to see if the symptoms will go away. Get medical help right away. Call your local emergency services (911 in the [place] Do not drive yourself to the hospital.   This information is not intended to replace advice given to you by your health care provider. Make sure you discuss any questions you have with your health care provider.   Document Released: 02/13/2005 Document Revised: 05/27/2014 Document Reviewed: 12/10/2013 Elsevier Interactive Patient Education [employer].
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
## 2015-12-05 NOTE — ED Notes (Signed)
Pt to come per MD.  Pt with syncopal episodes x 3 days in a row.  Troponin .05, normal EKG.  No chest pain or shortness of breath.  Occurs during postural changes.

## 2016-01-09 DIAGNOSIS — R5382 Chronic fatigue, unspecified: Secondary | ICD-10-CM | POA: Insufficient documentation

## 2016-01-09 DIAGNOSIS — E278 Other specified disorders of adrenal gland: Secondary | ICD-10-CM | POA: Insufficient documentation

## 2016-01-15 ENCOUNTER — Other Ambulatory Visit (HOSPITAL_COMMUNITY): Payer: Self-pay | Admitting: Respiratory Therapy

## 2016-01-15 DIAGNOSIS — R55 Syncope and collapse: Secondary | ICD-10-CM

## 2016-01-15 DIAGNOSIS — R569 Unspecified convulsions: Secondary | ICD-10-CM

## 2016-01-24 ENCOUNTER — Ambulatory Visit (HOSPITAL_COMMUNITY): Payer: Commercial Managed Care - HMO

## 2016-01-25 IMAGING — CR DG CHEST 2V
2 series · 2 of 2 positions shown · non-contrast
Comparison: 08/05/2013

CLINICAL DATA: Shortness of breath

EXAM:
CHEST  2 VIEW

[w chest pa]
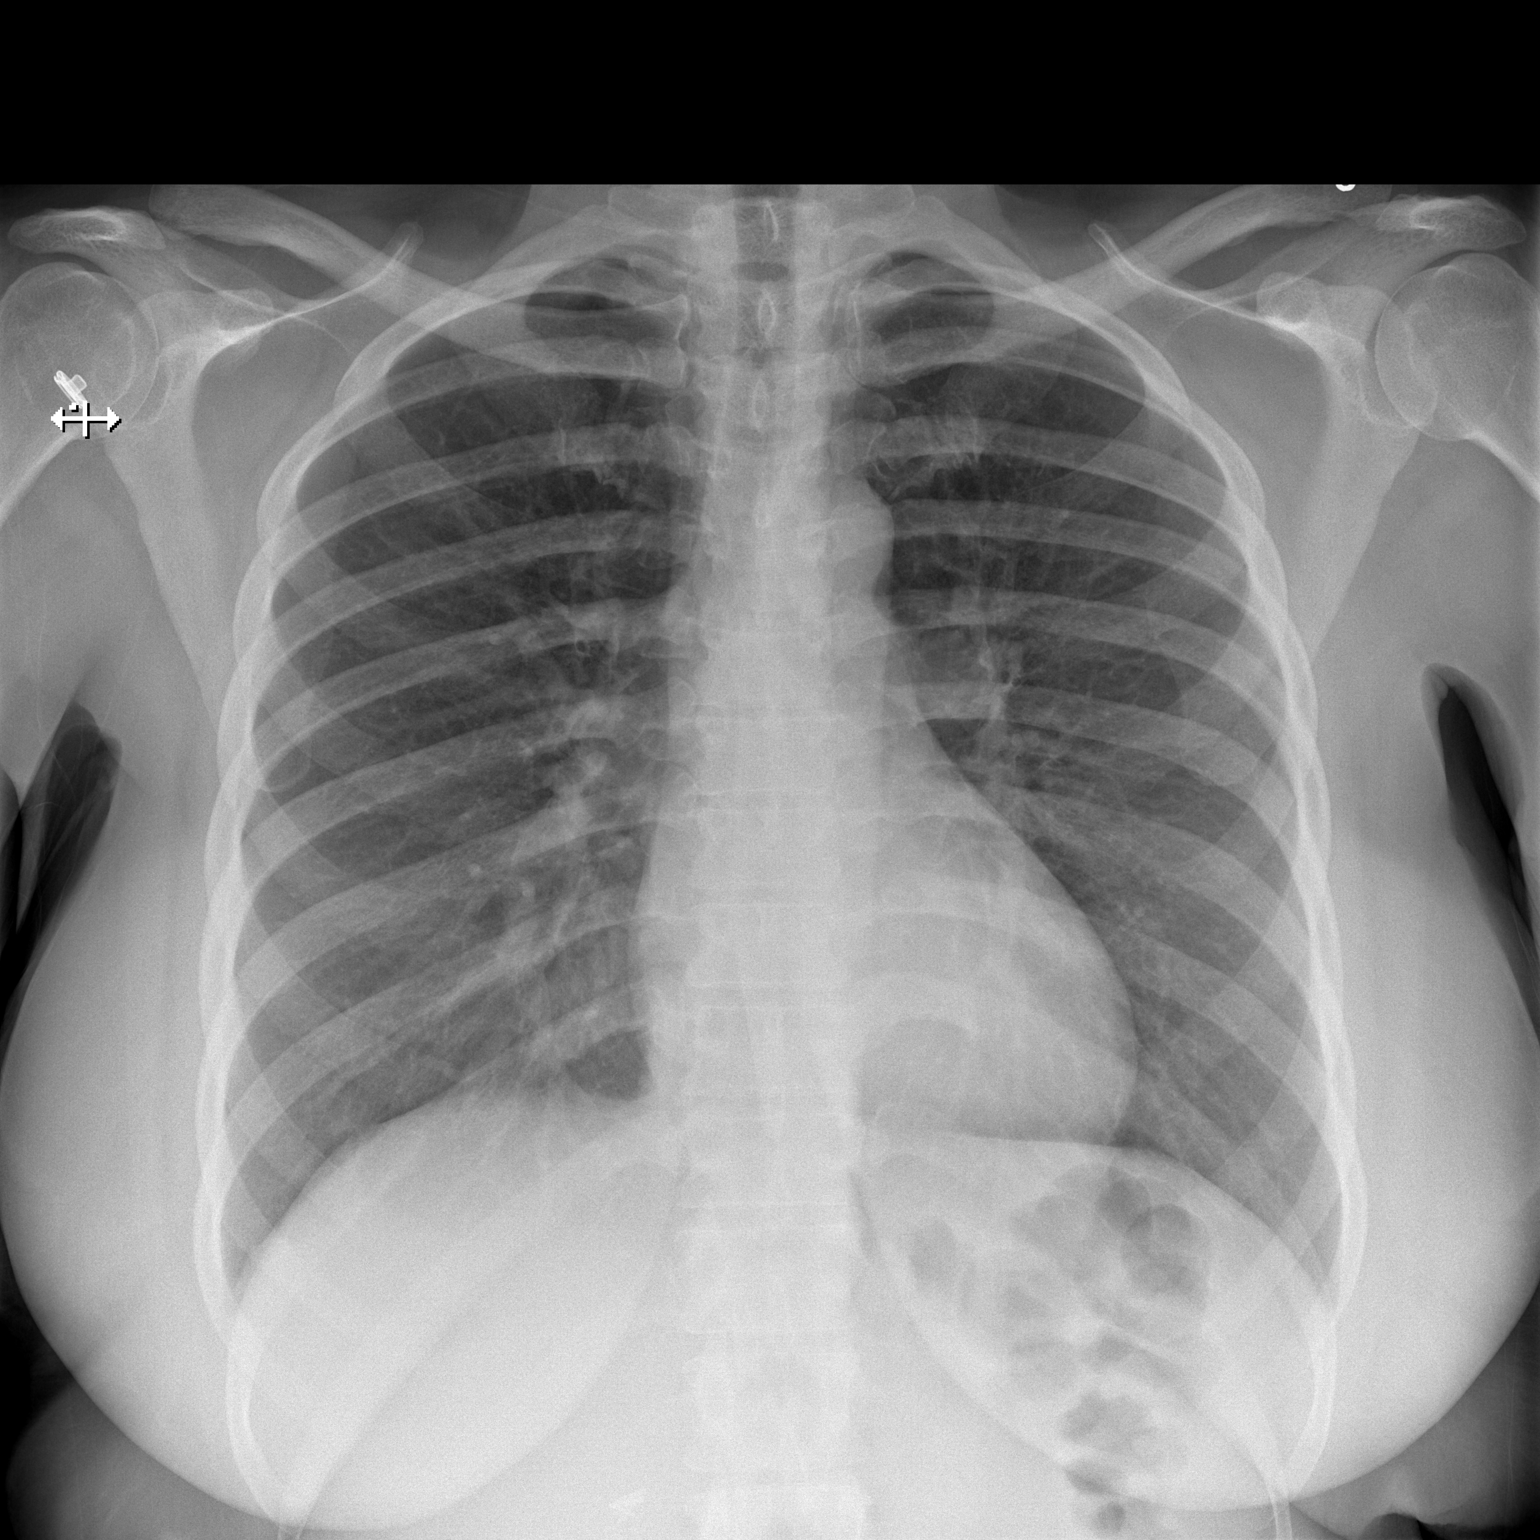

[w chest lat]
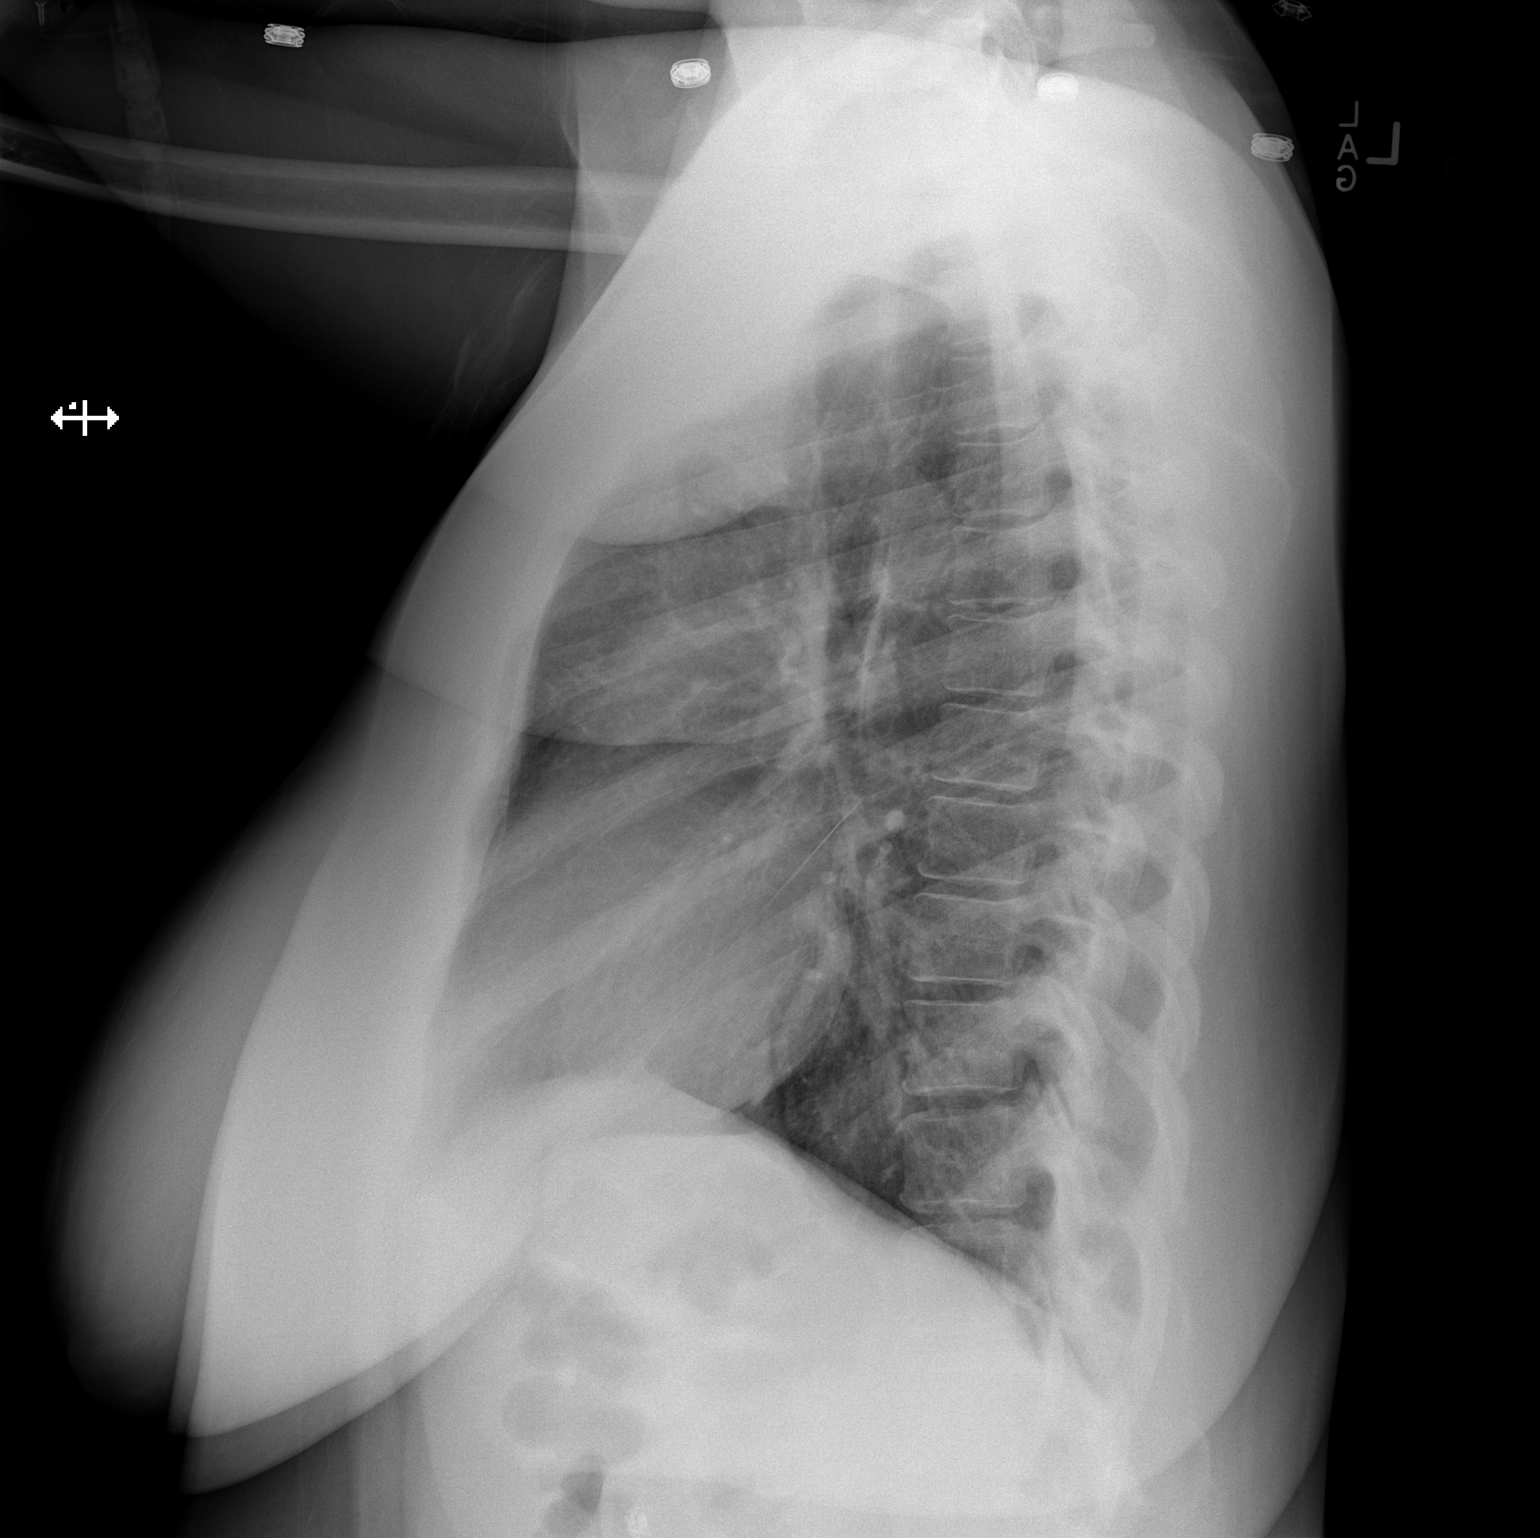

[2 of 2 positions shown; findings below may reference images not displayed]

FINDINGS: Normal heart size and mediastinal contours. There is partial
obscuration of the right diaphragm which is likely projectional
based on the comparison examination. No consolidation, edema,
effusion, or pneumothorax. Cholecystectomy changes.
IMPRESSION: No active cardiopulmonary disease.

## 2016-02-01 ENCOUNTER — Ambulatory Visit (HOSPITAL_COMMUNITY)
Admission: RE | Admit: 2016-02-01 | Discharge: 2016-02-01 | Disposition: A | Discharge status: Home / Self Care | Payer: Commercial Managed Care - HMO | Source: Ambulatory Visit | Attending: Neurology | Admitting: Neurology

## 2016-02-01 DIAGNOSIS — R55 Syncope and collapse: Secondary | ICD-10-CM

## 2016-02-01 DIAGNOSIS — R569 Unspecified convulsions: Secondary | ICD-10-CM

## 2016-02-01 NOTE — Progress Notes (Signed)
EEG completed; results pending.    

## 2016-02-01 NOTE — Procedures (Signed)
ELECTROENCEPHALOGRAM REPORT  Date of Study: 02/01/2016  Patient's Name: [name] MRN: [id number] Date of Birth: [birth year]  Referring Provider: Melburn Popper, MD  Clinical History: 40 year old woman with syncope and presyncope  Medications: Dayquil An unspecified antihypertensive medication  Technical Summary: A multichannel digital EEG recording measured by the international 10-20 system with electrodes applied with paste and impedances below 5000 ohms performed in our laboratory with EKG monitoring in an awake and drowsy patient.  Hyperventilation and photic stimulation were performed.  The digital EEG was referentially recorded, reformatted, and digitally filtered in a variety of bipolar and referential montages for optimal display.    Description: The patient is awake and drowsy during the recording.  During maximal wakefulness, there is a symmetric, low to medium voltage 12-13 Hz posterior dominant rhythm that attenuates with eye opening.  The record is symmetric.  During drowsiness and sleep, there is an increase in theta slowing of the background.  Vertex waves and symmetric sleep spindles were not seen.  Hyperventilation and photic stimulation did not elicit any abnormalities.  There were no epileptiform discharges or electrographic seizures seen.    EKG lead was unremarkable.  Impression: This awake and drowsy EEG is normal.    Clinical Correlation: A normal EEG does not exclude a clinical diagnosis of epilepsy.  If further clinical questions remain, prolonged EEG may be helpful.  Clinical correlation is advised.   Metta Clines, DO

## 2016-05-25 ENCOUNTER — Emergency Department (HOSPITAL_BASED_OUTPATIENT_CLINIC_OR_DEPARTMENT_OTHER)
Admission: EM | Admit: 2016-05-25 | Discharge: 2016-05-26 | Disposition: A | Discharge status: Home / Self Care | Payer: Commercial Managed Care - HMO | Attending: Emergency Medicine | Admitting: Emergency Medicine

## 2016-05-25 ENCOUNTER — Encounter (HOSPITAL_BASED_OUTPATIENT_CLINIC_OR_DEPARTMENT_OTHER): Payer: Self-pay | Admitting: Emergency Medicine

## 2016-05-25 ENCOUNTER — Emergency Department (HOSPITAL_BASED_OUTPATIENT_CLINIC_OR_DEPARTMENT_OTHER): Payer: Commercial Managed Care - HMO

## 2016-05-25 DIAGNOSIS — Z7982 Long term (current) use of aspirin: Secondary | ICD-10-CM | POA: Insufficient documentation

## 2016-05-25 DIAGNOSIS — I10 Essential (primary) hypertension: Secondary | ICD-10-CM | POA: Insufficient documentation

## 2016-05-25 DIAGNOSIS — F17210 Nicotine dependence, cigarettes, uncomplicated: Secondary | ICD-10-CM | POA: Diagnosis not present

## 2016-05-25 DIAGNOSIS — Z79899 Other long term (current) drug therapy: Secondary | ICD-10-CM | POA: Insufficient documentation

## 2016-05-25 DIAGNOSIS — J209 Acute bronchitis, unspecified: Secondary | ICD-10-CM | POA: Diagnosis not present

## 2016-05-25 DIAGNOSIS — R05 Cough: Secondary | ICD-10-CM | POA: Diagnosis present

## 2016-05-25 NOTE — ED Provider Notes (Signed)
[facility] DEPT [facility] Provider Note: Erin Spurling, MD, FACEP  CSN: [id number] MRN: [id number] ARRIVAL: 05/25/16 at 2123 ROOM: MH06/MH06  By signing my name below, I, [name], attest that this documentation has been prepared under the direction and in the presence of Erin Rosser, MD  Electronically Signed: Delton [name], ED Scribe. 05/25/16. 12:08 AM.   CHIEF COMPLAINT  Cough   HISTORY OF PRESENT ILLNESS  HPI Comments:  [name] is a 41 y.o. female who presents to the Emergency Department complaining of persistent, intermittent episodes of a dry cough x2-3 weeks. Her symptoms are worse at night and when exposed to fumes at work. Pt also reports sternal chest pain which travels through her to her back x several days, sneezing fits and post-tussive vomiting. She has been using her inhaler with no relief. Pt denies fever, nausea, vomiting or diarrhea. Pt also reports sudden onset, now resolved, right sided abdominal pain x 5 PM yesterday.  Past Medical History:  Diagnosis Date  . Adnexal cyst 2013   right  . Anemia 2015   "pernicious" and iron deficiency.  as of 2015/2016 no longer requiring po iron or B12 shots.   . 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 arthroscopy x 2.   . Colitis   . Headache(784.0)    migraines  . Hydradenitis 2001   axillary, multiple surgical I & Ds/excisions.  . Hypertension   . Nausea & vomiting 11/28/2014  . Obesity (BMI 30.0-34.9)   . Pityriasis rosea    pt also gives hx of seborrheic dermatitis.     Past Surgical History:  Procedure Laterality Date  . ABDOMINAL HYSTERECTOMY    . CYSTECTOMY     2004 axilla bil  . ESOPHAGOGASTRODUODENOSCOPY N/A 11/30/2014   Procedure: ESOPHAGOGASTRODUODENOSCOPY (EGD);  Surgeon: Jerene Bears, MD;  Location: [facility] ENDOSCOPY;  Service: Endoscopy;  Laterality: N/A;  . FRACTURE SURGERY     rt arm  . GASTRIC BYPASS  2011   gastric sleeve  . KNEE SURGERY  2005   left  . LAPAROSCOPIC CHOLECYSTECTOMY  2002  .  MEDIAL PATELLOFEMORAL LIGAMENT REPAIR Left 06/08/2013   Procedure: DIAGNOSTIC OPERATIVE ARTHROSCOPY, OPEN MEDIAL PATELLA FEMORAL LIGAMENT RECONSTRUCTION;  Surgeon: Meredith Pel, MD;  Location: [facility];  Service: Orthopedics;  Laterality: Left;  with Hamstring autograft  . OVARIAN CYST REMOVAL    . PARTIAL HYSTERECTOMY    . R arm surgery      Family History  Problem Relation Age of Onset  . Lupus Mother   . Crohn's disease Father   . Cancer Maternal Grandfather     lung    Social History  Substance Use Topics  . Smoking status: Current Every Day Smoker    Packs/day: 0.50    Years: 10.00    Types: Cigarettes  . Smokeless tobacco: Never Used  . Alcohol use No    Prior to Admission medications   Medication Sig Start Date End Date Taking? Authorizing Provider  Aspirin-Acetaminophen-Caffeine (GOODY HEADACHE PO) Take 1 packet by mouth daily as needed (pain).   Yes Historical Provider, MD  dicyclomine (BENTYL) 10 MG capsule Take 10 mg by mouth 4 (four) times daily -  before meals and at bedtime.   Yes Historical Provider, MD  fluticasone (FLONASE) 50 MCG/ACT nasal spray Place 2 sprays into both nostrils daily. Patient taking differently: Place 2 sprays into both nostrils daily as needed for allergies.  08/12/15  Yes Jeffrey Hedges, PA-C  olmesartan (BENICAR) 40 MG tablet Take  40 mg by mouth daily.   Yes Historical Provider, MD  potassium chloride SA (K-DUR,KLOR-CON) 20 MEQ tablet Take 1 tablet (20 mEq total) by mouth daily. 12/05/15  Yes Nicole Pisciotta, PA-C  traZODone (DESYREL) 100 MG tablet TAKE 1 TABLET BY MOUTH DAILY AT BEDTIME FOR SLEEP 12/12/14  Yes Historical Provider, MD  vitamin B-12 (CYANOCOBALAMIN) 1000 MCG tablet Take 1,000 mcg by mouth daily.   Yes Historical Provider, MD  albuterol (PROVENTIL HFA;VENTOLIN HFA) 108 (90 BASE) MCG/ACT inhaler Inhale 2 puffs into the lungs every 6 (six) hours as needed (chronic bronchitis). ProAir    Historical Provider, MD  azithromycin  (ZITHROMAX) 250 MG tablet Take 1 tablet (250 mg total) by mouth daily. Take first 2 tablets together, then 1 every day until finished. 05/26/16   Erin Rosser, MD    Allergies Penicillins   REVIEW OF SYSTEMS  Negative except as noted here or in the History of Present Illness.   PHYSICAL EXAMINATION  Initial Vital Signs Blood pressure (!) 162/111, pulse 84, temperature 98.4 F (36.9 C), temperature source Oral, resp. rate 18, height 6' (1.829 m), weight 200 lb (90.7 kg), SpO2 99 %.  Examination General: Well-developed, well-nourished female in no acute distress; appearance consistent with age of record HENT: normocephalic; atraumatic Eyes: pupils equal, round and reactive to light; extraocular muscles intact Neck: supple Heart: regular rate and rhythm Lungs: clear to auscultation bilaterally; rattly cough  Chest: sternal tenderness  Abdomen: soft; nondistended; nontender; no masses or hepatosplenomegaly; bowel sounds present Extremities: No deformity; full range of motion; pulses normal Neurologic: Awake, alert and oriented; motor function intact in all extremities and symmetric; no facial droop Skin: Warm and dry Psychiatric: Flat affect    RESULTS  Summary of this visit's results, reviewed by myself:   EKG Interpretation  Date/Time:    Ventricular Rate:    PR Interval:    QRS Duration:   QT Interval:    QTC Calculation:   R Axis:     Text Interpretation:        Laboratory Studies: No results found for this or any previous visit (from the past 24 hour(s)). Imaging Studies: Dg Chest 2 View  Result Date: 05/25/2016 CLINICAL DATA:  41 year old presenting with 1 week history of cough and chest congestion. Recent diagnosis of bronchitis. EXAM: CHEST  2 VIEW COMPARISON:  12/08/2015, 12/05/2015 and earlier, including CTA chest 12/08/2015. FINDINGS: Cardiomediastinal silhouette unremarkable. Lungs clear. Bronchovascular markings normal. Pulmonary vascularity normal. No  pneumothorax. No pleural effusions. Visualized bony thorax intact. IMPRESSION: Normal examination. Electronically Signed   By: [name] M.D.   On: 05/25/2016 22:48    ED COURSE  Nursing notes and initial vitals signs, including pulse oximetry, reviewed.  Vitals:   05/25/16 2152 05/25/16 2153  BP: (!) 162/111   Pulse: 84   Resp: 18   Temp: 98.4 F (36.9 C)   TempSrc: Oral   SpO2: 99%   Weight:  200 lb (90.7 kg)  Height:  6' (1.829 m)   We'll have respiratory therapy provide an AeroChamber and instructed her in shoes. We will add an inhaled steroid and place her on an antibiotic as this has been a prolonged course.  PROCEDURES    ED DIAGNOSES     ICD-9-CM ICD-10-CM   1. Acute bronchitis with bronchospasm 466.0 J20.9     I personally performed the services described in this documentation, which was scribed in my presence. The recorded information has been reviewed and is accurate.  Erin Rosser, MD 05/26/16 [phone]

## 2016-05-25 NOTE — ED Triage Notes (Signed)
Pt reports dx of bronchitis 1 week ago with minimal to no resolution of symptoms since and states that today she began having sharp URQ pain.  Denies n/v/d or fever.

## 2016-05-26 MED ORDER — FLUTICASONE PROPIONATE HFA 44 MCG/ACT IN AERO
2.0000 | INHALATION_SPRAY | Freq: Two times a day (BID) | RESPIRATORY_TRACT | Status: DC
  Administered 2016-05-26: 2 via RESPIRATORY_TRACT
  Filled 2016-05-26: qty 10.6

## 2016-05-26 MED ORDER — AEROCHAMBER PLUS W/MASK MISC
1.0000 | Freq: Once | Status: AC
  Administered 2016-05-26: 1
  Filled 2016-05-26: qty 1

## 2016-05-26 MED ORDER — AZITHROMYCIN 250 MG PO TABS: 250.0000 mg | ORAL_TABLET | Freq: Every day | ORAL | 0 refills | Status: DC

## 2016-05-26 NOTE — ED Notes (Signed)
RT into room

## 2016-05-26 NOTE — ED Notes (Signed)
Pt seen by EDP prior to RN assessment, see MD notes, pending or ders.

## 2016-05-26 NOTE — ED Notes (Signed)
RT into room, at [facility].

## 2016-05-26 NOTE — ED Notes (Signed)
EDP into room 

## 2016-06-07 ENCOUNTER — Encounter (HOSPITAL_BASED_OUTPATIENT_CLINIC_OR_DEPARTMENT_OTHER): Payer: Self-pay

## 2016-06-07 ENCOUNTER — Emergency Department (HOSPITAL_BASED_OUTPATIENT_CLINIC_OR_DEPARTMENT_OTHER): Payer: Commercial Managed Care - HMO

## 2016-06-07 ENCOUNTER — Emergency Department (HOSPITAL_BASED_OUTPATIENT_CLINIC_OR_DEPARTMENT_OTHER)
Admission: EM | Admit: 2016-06-07 | Discharge: 2016-06-07 | Disposition: A | Discharge status: Home / Self Care | Payer: Commercial Managed Care - HMO | Attending: Emergency Medicine | Admitting: Emergency Medicine

## 2016-06-07 DIAGNOSIS — Z79899 Other long term (current) drug therapy: Secondary | ICD-10-CM | POA: Diagnosis not present

## 2016-06-07 DIAGNOSIS — K529 Noninfective gastroenteritis and colitis, unspecified: Secondary | ICD-10-CM | POA: Insufficient documentation

## 2016-06-07 DIAGNOSIS — I10 Essential (primary) hypertension: Secondary | ICD-10-CM | POA: Diagnosis not present

## 2016-06-07 DIAGNOSIS — F17210 Nicotine dependence, cigarettes, uncomplicated: Secondary | ICD-10-CM | POA: Insufficient documentation

## 2016-06-07 DIAGNOSIS — Z7982 Long term (current) use of aspirin: Secondary | ICD-10-CM | POA: Insufficient documentation

## 2016-06-07 DIAGNOSIS — R1011 Right upper quadrant pain: Secondary | ICD-10-CM | POA: Diagnosis present

## 2016-06-07 LAB — COMPREHENSIVE METABOLIC PANEL
ALT: 21 U/L (ref 14–54)
ANION GAP: 6 (ref 5–15)
AST: 23 U/L (ref 15–41)
Albumin: 3.3 g/dL — ABNORMAL LOW (ref 3.5–5.0)
Alkaline Phosphatase: 67 U/L (ref 38–126)
BILIRUBIN TOTAL: 0.8 mg/dL (ref 0.3–1.2)
BUN: 7 mg/dL (ref 6–20)
CHLORIDE: 105 mmol/L (ref 101–111)
CO2: 28 mmol/L (ref 22–32)
Calcium: 8.5 mg/dL — ABNORMAL LOW (ref 8.9–10.3)
Creatinine, Ser: 0.7 mg/dL (ref 0.44–1.00)
Glucose, Bld: 118 mg/dL — ABNORMAL HIGH (ref 65–99)
POTASSIUM: 3.1 mmol/L — AB (ref 3.5–5.1)
Sodium: 139 mmol/L (ref 135–145)
TOTAL PROTEIN: 6.5 g/dL (ref 6.5–8.1)

## 2016-06-07 LAB — CBC
HEMATOCRIT: 43 % (ref 36.0–46.0)
HEMOGLOBIN: 14.3 g/dL (ref 12.0–15.0)
MCH: 33.2 pg (ref 26.0–34.0)
MCHC: 33.3 g/dL (ref 30.0–36.0)
MCV: 99.8 fL (ref 78.0–100.0)
Platelets: 248 10*3/uL (ref 150–400)
RBC: 4.31 MIL/uL (ref 3.87–5.11)
RDW: 13.6 % (ref 11.5–15.5)
WBC: 6 10*3/uL (ref 4.0–10.5)

## 2016-06-07 LAB — URINALYSIS, MICROSCOPIC (REFLEX): RBC / HPF: NONE SEEN RBC/hpf (ref 0–5)

## 2016-06-07 LAB — URINALYSIS, ROUTINE W REFLEX MICROSCOPIC
Glucose, UA: NEGATIVE mg/dL
HGB URINE DIPSTICK: NEGATIVE
KETONES UR: NEGATIVE mg/dL
Leukocytes, UA: NEGATIVE
Nitrite: NEGATIVE
PROTEIN: 30 mg/dL — AB
Specific Gravity, Urine: 1.025 (ref 1.005–1.030)
pH: 6 (ref 5.0–8.0)

## 2016-06-07 LAB — LIPASE, BLOOD: LIPASE: 32 U/L (ref 11–51)

## 2016-06-07 MED ORDER — HYDROCODONE-ACETAMINOPHEN 5-325 MG PO TABS: 1.0000 | ORAL_TABLET | Freq: Four times a day (QID) | ORAL | 0 refills | Status: DC | PRN

## 2016-06-07 MED ORDER — CIPROFLOXACIN HCL 500 MG PO TABS: 500.0000 mg | ORAL_TABLET | Freq: Two times a day (BID) | ORAL | 0 refills | Status: DC

## 2016-06-07 MED ORDER — ONDANSETRON HCL 4 MG/2ML IJ SOLN
4.0000 mg | Freq: Once | INTRAMUSCULAR | Status: AC | PRN
  Administered 2016-06-07: 4 mg via INTRAVENOUS
  Filled 2016-06-07: qty 2

## 2016-06-07 MED ORDER — ONDANSETRON 4 MG PO TBDP
4.0000 mg | ORAL_TABLET | Freq: Three times a day (TID) | ORAL | 0 refills | Status: DC | PRN
Start: 2016-06-07 — End: 2016-10-11

## 2016-06-07 MED ORDER — METRONIDAZOLE 500 MG PO TABS: 500.0000 mg | ORAL_TABLET | Freq: Three times a day (TID) | ORAL | 0 refills | Status: DC

## 2016-06-07 MED ORDER — IOPAMIDOL (ISOVUE-300) INJECTION 61%
100.0000 mL | Freq: Once | INTRAVENOUS | Status: AC | PRN
  Administered 2016-06-07: 100 mL via INTRAVENOUS

## 2016-06-07 MED FILL — ONDANSETRON ODT 4 MG TABLET: 4 | 4 days supply | Qty: 12 | Fill #0

## 2016-06-07 MED FILL — HYDROCODON-APAP 5-325: 5-325 | 3 days supply | Qty: 12 | Fill #0

## 2016-06-07 MED FILL — metroNIDAZOLE 500 MG TABS: 500 | 7 days supply | Qty: 21 | Fill #0

## 2016-06-07 MED FILL — CIPROFLOXACIN HCL 500 MG TA: 500 | 7 days supply | Qty: 14 | Fill #0

## 2016-06-07 NOTE — ED Provider Notes (Signed)
[facility] DEPT [facility] Provider Note   CSN: [id number] Arrival date & time: 06/07/16  1149     History   Chief Complaint Chief Complaint  Patient presents with  . Abdominal Pain    HPI [name] is a 41 y.o. female with PMH of gastric sleeve in 2010, hx of infectious colitis, s/p cholecystectomy in 2002, HTN, low back pain who presents with abdominal pain, vomiting, and diarrhea.   HPI Patient reports of intermittent abdominal pain starting yesterday when she consumed anything. Reports it initially starts in the RUQ then becomes diffuse. This pain is intermittent and seems to come after she eats or if she lies flat. She has also been having loose stools since yesterday. This morning around 3 am she woke up with abdominal cramps. She went to the bathroom and had a soft BM and the symptoms resolved. She went to bed but symptoms returned shortly afterward and she had non-bloody emesis. She had about 3-4 episodes of non-bloody/non-bilious emesis overnight. NO dysuria, increased urinary frequency, or hematuria. Reports she had hysterectomy. No sick contacts. Reports this is similar to the time she had to have a cholecystectomy.   Past Medical History:  Diagnosis Date  . Adnexal cyst 2013   right  . Anemia 2015   "pernicious" and iron deficiency.  as of 2015/2016 no longer requiring po iron or B12 shots.   . 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 arthroscopy x 2.   . Colitis   . Headache(784.0)    migraines  . Hydradenitis 2001   axillary, multiple surgical I & Ds/excisions.  . Hypertension   . Nausea & vomiting 11/28/2014  . Obesity (BMI 30.0-34.9)   . Pityriasis rosea    pt also gives hx of seborrheic dermatitis.     Patient Active Problem List   Diagnosis Date Noted  . Infectious colitis 11/21/2015  . Chronic low back pain 11/21/2015  . Chronic asthmatic bronchitis ([place]) 11/21/2015  . Bacterial vaginosis 11/21/2015  . Elevated lipase 12/14/2014  . AP (abdominal pain)  12/14/2014  . Nausea with vomiting 12/14/2014  . Abdominal pain, epigastric   . Nausea and vomiting 11/29/2014  . Intractable abdominal pain 11/28/2014  . Hypokalemia 11/28/2014  . HTN (hypertension) 11/28/2014  . Pilonidal abscess 12/03/2013  . Patellar instability of left knee 06/08/2013    Past Surgical History:  Procedure Laterality Date  . ABDOMINAL HYSTERECTOMY    . CYSTECTOMY     2004 axilla bil  . ESOPHAGOGASTRODUODENOSCOPY N/A 11/30/2014   Procedure: ESOPHAGOGASTRODUODENOSCOPY (EGD);  Surgeon: Jerene Bears, MD;  Location: [facility] ENDOSCOPY;  Service: Endoscopy;  Laterality: N/A;  . FRACTURE SURGERY     rt arm  . GASTRIC BYPASS  2011   gastric sleeve  . KNEE SURGERY  2005   left  . LAPAROSCOPIC CHOLECYSTECTOMY  2002  . MEDIAL PATELLOFEMORAL LIGAMENT REPAIR Left 06/08/2013   Procedure: DIAGNOSTIC OPERATIVE ARTHROSCOPY, OPEN MEDIAL PATELLA FEMORAL LIGAMENT RECONSTRUCTION;  Surgeon: Meredith Pel, MD;  Location: [facility];  Service: Orthopedics;  Laterality: Left;  with Hamstring autograft  . OVARIAN CYST REMOVAL    . PARTIAL HYSTERECTOMY    . R arm surgery      OB History    No data available       Home Medications    Prior to Admission medications   Medication Sig Start Date End Date Taking? Authorizing Provider  albuterol (PROVENTIL HFA;VENTOLIN HFA) 108 (90 BASE) MCG/ACT inhaler Inhale 2 puffs into the lungs every 6 (  six) hours as needed (chronic bronchitis). ProAir    Historical Provider, MD  Aspirin-Acetaminophen-Caffeine (GOODY HEADACHE PO) Take 1 packet by mouth daily as needed (pain).    Historical Provider, MD  ciprofloxacin (CIPRO) 500 MG tablet Take 1 tablet (500 mg total) by mouth 2 (two) times daily. 06/07/16   Smiley Houseman, MD  dicyclomine (BENTYL) 10 MG capsule Take 10 mg by mouth 4 (four) times daily -  before meals and at bedtime.    Historical Provider, MD  fluticasone (FLONASE) 50 MCG/ACT nasal spray Place 2 sprays into both nostrils  daily. Patient taking differently: Place 2 sprays into both nostrils daily as needed for allergies.  08/12/15   Okey Regal, PA-C  HYDROcodone-acetaminophen (NORCO/VICODIN) 5-325 MG tablet Take 1 tablet by mouth every 6 (six) hours as needed for moderate pain. 06/07/16   Smiley Houseman, MD  metroNIDAZOLE (FLAGYL) 500 MG tablet Take 1 tablet (500 mg total) by mouth 3 (three) times daily. 06/07/16   Smiley Houseman, MD  olmesartan (BENICAR) 40 MG tablet Take 40 mg by mouth daily.    Historical Provider, MD  ondansetron (ZOFRAN-ODT) 4 MG disintegrating tablet Take 1 tablet (4 mg total) by mouth every 8 (eight) hours as needed for nausea or vomiting. 06/07/16   Smiley Houseman, MD  potassium chloride SA (K-DUR,KLOR-CON) 20 MEQ tablet Take 1 tablet (20 mEq total) by mouth daily. 12/05/15   Nicole Pisciotta, PA-C  traZODone (DESYREL) 100 MG tablet TAKE 1 TABLET BY MOUTH DAILY AT BEDTIME FOR SLEEP 12/12/14   Historical Provider, MD  vitamin B-12 (CYANOCOBALAMIN) 1000 MCG tablet Take 1,000 mcg by mouth daily.    Historical Provider, MD    Family History Family History  Problem Relation Age of Onset  . Lupus Mother   . Crohn's disease Father   . Cancer Maternal Grandfather     lung    Social History Social History  Substance Use Topics  . Smoking status: Current Every Day Smoker    Packs/day: 0.50    Years: 10.00    Types: Cigarettes  . Smokeless tobacco: Never Used  . Alcohol use No     Allergies   Penicillins   Review of Systems Review of Systems  Constitutional: Negative for chills and fever.  Gastrointestinal: Positive for abdominal pain, diarrhea, nausea and vomiting. Negative for blood in stool.  Genitourinary: Negative for dysuria, frequency and hematuria.     Physical Exam Updated Vital Signs BP (!) 171/107   Pulse (!) 54   Temp 99.1 F (37.3 C) (Oral)   Resp 16   Ht 6' (1.829 m)   Wt 90.7 kg   SpO2 96%   BMI 27.12 kg/m   Physical Exam   Constitutional: She is oriented to person, place, and time. She appears well-developed and well-nourished. No distress.  HENT:  Mouth/Throat: Oropharynx is clear and moist. No oropharyngeal exudate.  Eyes: Conjunctivae are normal.  Cardiovascular: Normal rate, regular rhythm and normal heart sounds.  Exam reveals no gallop and no friction rub.   No murmur heard. Pulmonary/Chest: Effort normal and breath sounds normal.  Abdominal: Soft. Bowel sounds are normal. She exhibits no distension and no mass. There is tenderness (tenderness in the RUQ mainly. reports of radiation pain to the RUQ when palpating the RLQ. no guarding or rebound tenderness. ).  Neurological: She is alert and oriented to person, place, and time.  Skin: Skin is warm and dry. Capillary refill takes less than 2 seconds. No rash noted.  Psychiatric: She has a normal mood and affect. Her behavior is normal.     ED Treatments / Results  Labs (all labs ordered are listed, but only abnormal results are displayed) Labs Reviewed  URINALYSIS, ROUTINE W REFLEX MICROSCOPIC - Abnormal; Notable for the following:       Result Value   Color, Urine AMBER (*)    APPearance CLOUDY (*)    Bilirubin Urine SMALL (*)    Protein, ur 30 (*)    All other components within normal limits  COMPREHENSIVE METABOLIC PANEL - Abnormal; Notable for the following:    Potassium 3.1 (*)    Glucose, Bld 118 (*)    Calcium 8.5 (*)    Albumin 3.3 (*)    All other components within normal limits  URINALYSIS, MICROSCOPIC (REFLEX) - Abnormal; Notable for the following:    Bacteria, UA MANY (*)    Squamous Epithelial / LPF 0-5 (*)    All other components within normal limits  LIPASE, BLOOD  CBC    EKG  EKG Interpretation None       Radiology Ct Abdomen Pelvis W Contrast  Result Date: 06/07/2016 CLINICAL DATA:  Generalized abdominal pain, nausea, vomiting, diarrhea for 1 day EXAM: CT ABDOMEN AND PELVIS WITH CONTRAST TECHNIQUE: Multidetector  CT imaging of the abdomen and pelvis was performed using the standard protocol following bolus administration of intravenous contrast. CONTRAST:  16mL ISOVUE-300 IOPAMIDOL (ISOVUE-300) INJECTION 61% COMPARISON:  11/21/2015 FINDINGS: Lower chest: No acute abnormality. Hepatobiliary: No focal liver abnormality is seen. Status post cholecystectomy. No biliary dilatation. Pancreas: Unremarkable. No pancreatic ductal dilatation or surrounding inflammatory changes. Spleen: Normal in size without focal abnormality. Adrenals/Urinary Tract: Normal left adrenal gland. 15 mm stable right adrenal mass unchanged compared with 11/25/2014 likely reflecting an adrenal adenoma. Kidneys are normal, without renal calculi, focal lesion, or hydronephrosis. Bladder is unremarkable. Stomach/Bowel: Stomach is within normal limits. Appendix appears normal. No pneumatosis, pneumoperitoneum or portal venous gas. Bowel wall thickening of the ascending colon, transverse colon and rectum concerning for proctocolitis secondary to an infectious or inflammatory etiology. Vascular/Lymphatic: No significant vascular findings are present. No enlarged abdominal or pelvic lymph nodes. Reproductive: Status post hysterectomy. No adnexal masses. Other: No abdominal wall hernia or abnormality. No abdominopelvic ascites. Musculoskeletal: No acute or significant osseous findings. IMPRESSION: 1. Bowel wall thickening of the ascending colon, transverse colon and rectum concerning for proctocolitis secondary to an infectious or inflammatory etiology. Electronically Signed   By: Kathreen Devoid   On: 06/07/2016 16:06    Procedures Procedures (including critical care time)  Medications Ordered in ED Medications  ondansetron (ZOFRAN) injection 4 mg (4 mg Intravenous Given 06/07/16 1344)  iopamidol (ISOVUE-300) 61 % injection 100 mL (100 mLs Intravenous Contrast Given 06/07/16 1549)     Initial Impression / Assessment and Plan / ED Course  I have reviewed  the triage vital signs and the nursing notes.  Pertinent labs & imaging results that were available during my care of the patient were reviewed by me and considered in my medical decision making (see chart for details).   Patient with intermittent abdominal pain associated with PO intake, emesis, and loose stools. Zofran x 1 given. On exam tenderness mainly in the RUQ. Unclear in etiology. Possibly gall stone in the biliary tract as she has had a cholecystectomy. CBC is unremarkable. CMP is unremarkable except for mild hypokalemia to 3.1. UA without signs of infection. Will obtain CT abdomen to further evaluate.   4:22: CT abdomen  showed bowel wall thickening of ascending colon, transverse colon and rectum concerning for proctocolitis secondary to an infectious or inflammatory etiology. Provided prescription for Ciprofloxacin and Flagyl x7 days, Norco PRN for pain,and Zofran PRN for nausea. Patient to follow up with GI ASAP. Return precautions discussed. Patient currently stable for discharge.   Final Clinical Impressions(s) / ED Diagnoses   Final diagnoses:  Colitis    New Prescriptions New Prescriptions   CIPROFLOXACIN (CIPRO) 500 MG TABLET    Take 1 tablet (500 mg total) by mouth 2 (two) times daily.   HYDROCODONE-ACETAMINOPHEN (NORCO/VICODIN) 5-325 MG TABLET    Take 1 tablet by mouth every 6 (six) hours as needed for moderate pain.   METRONIDAZOLE (FLAGYL) 500 MG TABLET    Take 1 tablet (500 mg total) by mouth 3 (three) times daily.   ONDANSETRON (ZOFRAN-ODT) 4 MG DISINTEGRATING TABLET    Take 1 tablet (4 mg total) by mouth every 8 (eight) hours as needed for nausea or vomiting.     Smiley Houseman, MD 06/07/16 [place], MD 06/08/16 [phone]

## 2016-06-07 NOTE — ED Notes (Signed)
Pt reports a TMax 102.4 and pt took APAP at 10:00 today

## 2016-06-07 NOTE — ED Triage Notes (Signed)
C/o abd pain n/v/d since 3am-NAD-steady gait

## 2016-06-07 NOTE — Discharge Instructions (Signed)
Your imaging showed that you have colitis. This could be infectious colitis but could be autoimmune/inflammatory process. Please follow up with your GI doctor ASAP.  Please take the following antibiotics: Ciprofloxacin and Flagyl. You can take Norco as needed for pain. Please return if you are unable to keep yourself hydrated or if  your symptoms worsen.

## 2016-06-07 NOTE — ED Notes (Signed)
Patient transported to CT 

## 2016-08-27 ENCOUNTER — Emergency Department (HOSPITAL_BASED_OUTPATIENT_CLINIC_OR_DEPARTMENT_OTHER): Payer: Commercial Managed Care - HMO

## 2016-08-27 ENCOUNTER — Encounter (HOSPITAL_BASED_OUTPATIENT_CLINIC_OR_DEPARTMENT_OTHER): Payer: Self-pay | Admitting: *Deleted

## 2016-08-27 ENCOUNTER — Emergency Department (HOSPITAL_BASED_OUTPATIENT_CLINIC_OR_DEPARTMENT_OTHER)
Admission: EM | Admit: 2016-08-27 | Discharge: 2016-08-28 | Disposition: A | Discharge status: Home / Self Care | Payer: Commercial Managed Care - HMO | Attending: Emergency Medicine | Admitting: Emergency Medicine

## 2016-08-27 DIAGNOSIS — R05 Cough: Secondary | ICD-10-CM | POA: Insufficient documentation

## 2016-08-27 DIAGNOSIS — F17210 Nicotine dependence, cigarettes, uncomplicated: Secondary | ICD-10-CM | POA: Diagnosis not present

## 2016-08-27 DIAGNOSIS — K6289 Other specified diseases of anus and rectum: Secondary | ICD-10-CM | POA: Insufficient documentation

## 2016-08-27 DIAGNOSIS — I10 Essential (primary) hypertension: Secondary | ICD-10-CM | POA: Insufficient documentation

## 2016-08-27 DIAGNOSIS — K5900 Constipation, unspecified: Secondary | ICD-10-CM | POA: Insufficient documentation

## 2016-08-27 DIAGNOSIS — Z7982 Long term (current) use of aspirin: Secondary | ICD-10-CM | POA: Insufficient documentation

## 2016-08-27 DIAGNOSIS — R1031 Right lower quadrant pain: Secondary | ICD-10-CM

## 2016-08-27 LAB — CBC
HEMATOCRIT: 39.5 % (ref 36.0–46.0)
HEMOGLOBIN: 13.5 g/dL (ref 12.0–15.0)
MCH: 34.1 pg — AB (ref 26.0–34.0)
MCHC: 34.2 g/dL (ref 30.0–36.0)
MCV: 99.7 fL (ref 78.0–100.0)
Platelets: 233 10*3/uL (ref 150–400)
RBC: 3.96 MIL/uL (ref 3.87–5.11)
RDW: 12.7 % (ref 11.5–15.5)
WBC: 5.7 10*3/uL (ref 4.0–10.5)

## 2016-08-27 LAB — URINALYSIS, MICROSCOPIC (REFLEX)

## 2016-08-27 LAB — URINALYSIS, ROUTINE W REFLEX MICROSCOPIC
Glucose, UA: NEGATIVE mg/dL
HGB URINE DIPSTICK: NEGATIVE
Ketones, ur: 15 mg/dL — AB
NITRITE: NEGATIVE
PROTEIN: NEGATIVE mg/dL
SPECIFIC GRAVITY, URINE: 1.031 — AB (ref 1.005–1.030)
pH: 5 (ref 5.0–8.0)

## 2016-08-27 LAB — COMPREHENSIVE METABOLIC PANEL
ALT: 24 U/L (ref 14–54)
AST: 28 U/L (ref 15–41)
Albumin: 2.9 g/dL — ABNORMAL LOW (ref 3.5–5.0)
Alkaline Phosphatase: 64 U/L (ref 38–126)
Anion gap: 5 (ref 5–15)
BUN: 7 mg/dL (ref 6–20)
CHLORIDE: 107 mmol/L (ref 101–111)
CO2: 29 mmol/L (ref 22–32)
CREATININE: 0.81 mg/dL (ref 0.44–1.00)
Calcium: 8.3 mg/dL — ABNORMAL LOW (ref 8.9–10.3)
Glucose, Bld: 101 mg/dL — ABNORMAL HIGH (ref 65–99)
POTASSIUM: 3.5 mmol/L (ref 3.5–5.1)
SODIUM: 141 mmol/L (ref 135–145)
Total Bilirubin: 0.3 mg/dL (ref 0.3–1.2)
Total Protein: 5.7 g/dL — ABNORMAL LOW (ref 6.5–8.1)

## 2016-08-27 LAB — LIPASE, BLOOD: LIPASE: 53 U/L — AB (ref 11–51)

## 2016-08-27 LAB — PREGNANCY, URINE: Preg Test, Ur: NEGATIVE

## 2016-08-27 MED ORDER — ONDANSETRON HCL 4 MG/2ML IJ SOLN
4.0000 mg | Freq: Once | INTRAMUSCULAR | Status: AC
  Administered 2016-08-27: 4 mg via INTRAVENOUS
  Filled 2016-08-27: qty 2

## 2016-08-27 MED ORDER — SODIUM CHLORIDE 0.9 % IV BOLUS (SEPSIS)
1000.0000 mL | Freq: Once | INTRAVENOUS | Status: AC
  Administered 2016-08-27: 1000 mL via INTRAVENOUS

## 2016-08-27 MED ORDER — LORAZEPAM 2 MG/ML IJ SOLN
1.0000 mg | Freq: Once | INTRAMUSCULAR | Status: AC
  Administered 2016-08-27: 1 mg via INTRAVENOUS
  Filled 2016-08-27: qty 1

## 2016-08-27 MED ORDER — ONDANSETRON HCL 4 MG/2ML IJ SOLN
INTRAMUSCULAR | Status: AC
  Filled 2016-08-27: qty 2

## 2016-08-27 MED ORDER — IOPAMIDOL (ISOVUE-300) INJECTION 61%
100.0000 mL | Freq: Once | INTRAVENOUS | Status: AC | PRN
  Administered 2016-08-28: 100 mL via INTRAVENOUS

## 2016-08-27 MED ORDER — DICYCLOMINE HCL 10 MG PO CAPS
20.0000 mg | ORAL_CAPSULE | Freq: Once | ORAL | Status: AC
  Administered 2016-08-27: 20 mg via ORAL
  Filled 2016-08-27: qty 2

## 2016-08-27 MED ORDER — MORPHINE SULFATE (PF) 4 MG/ML IV SOLN
4.0000 mg | Freq: Once | INTRAVENOUS | Status: AC
  Administered 2016-08-27: 4 mg via INTRAVENOUS
  Filled 2016-08-27: qty 1

## 2016-08-27 NOTE — ED Notes (Signed)
Pt c/o lower abdominal pain that radiates to her rectum with a lot of pressure on the right side of her rectum.  Pt states she is able to pass gas and had a large bowel movement prior to arrival and the pain subsided, but then came back and pain is worse with eating.

## 2016-08-27 NOTE — ED Notes (Signed)
Pt crying asking for something for gas pain, unable to drink contrast

## 2016-08-27 NOTE — ED Triage Notes (Signed)
Pain in her lower abdomen worse on the right lower quadrant. Pressure in her rectum on the right side. Pain started last night.

## 2016-08-27 NOTE — ED Provider Notes (Signed)
[place] DEPT [facility] Provider Note   CSN: [id number] Arrival date & time: 08/27/16  2028 By signing my name below, I, Dyke Brackett, attest that this documentation has been prepared under the direction and in the presence of non-physician practitioner, Arlean Hopping, PA-C. Electronically Signed: Dyke Brackett, Scribe. 08/27/2016. 10:46 PM.   History   Chief Complaint Chief Complaint  Patient presents with  . Abdominal Pain  . Back Pain   HPI [name] is a 41 y.o. female with a history of colitis and pancreatitis who presents to the Emergency Department complaining of constant, right uppper and lower abdominal pain onset yesterday. She describes this pain as 7/10 pressure-like sensation with intermittent sharp pains, and states she feels as if she needs to pass gas. This pain is unchanged when she moves her bowels. Per pt, she has never experienced this pain before. She has taken Gas-x and ibuprofen with no relief of pain. Pt reports associated flatulence, constipation, back pain, cough, and decreased appetite. Her last BM was tonight at 7 pm and was hard. She also reports a pressure like sensation to her rectum. She endorses occasional alcohol use. No new medications. Pt had an endoscopy and colonoscopy in 2017 after she was dx with pancreatitis, of which she has had many episodes. She endorses abdominal SHx of cholecystectomy and hysterectomy. Pt denies any N/V, diarrhea, hematochezia/melena, fever, vaginal discharge/bleeding, vaginal pain or pressure, chest pain, SOB, or any other associated symptoms.   Gastroenterologist: Pollyann Savoy, MD  The history is provided by the patient. No language interpreter was used.   Past Medical History:  Diagnosis Date  . Adnexal cyst 2013   right  . Anemia 2015   "pernicious" and iron deficiency.  as of 2015/2016 no longer requiring po iron or B12 shots.   . 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 arthroscopy x 2.   . Colitis   . Headache(784.0)    migraines  . Hydradenitis 2001   axillary, multiple surgical I & Ds/excisions.  . Hypertension   . Nausea & vomiting 11/28/2014  . Obesity (BMI 30.0-34.9)   . Pityriasis rosea    pt also gives hx of seborrheic dermatitis.     Patient Active Problem List   Diagnosis Date Noted  . Infectious colitis 11/21/2015  . Chronic low back pain 11/21/2015  . Chronic asthmatic bronchitis ([place]) 11/21/2015  . Bacterial vaginosis 11/21/2015  . Elevated lipase 12/14/2014  . AP (abdominal pain) 12/14/2014  . Nausea with vomiting 12/14/2014  . Abdominal pain, epigastric   . Nausea and vomiting 11/29/2014  . Intractable abdominal pain 11/28/2014  . Hypokalemia 11/28/2014  . HTN (hypertension) 11/28/2014  . Pilonidal abscess 12/03/2013  . Patellar instability of left knee 06/08/2013    Past Surgical History:  Procedure Laterality Date  . ABDOMINAL HYSTERECTOMY    . CYSTECTOMY     2004 axilla bil  . ESOPHAGOGASTRODUODENOSCOPY N/A 11/30/2014   Procedure: ESOPHAGOGASTRODUODENOSCOPY (EGD);  Surgeon: Jerene Bears, MD;  Location: [facility] ENDOSCOPY;  Service: Endoscopy;  Laterality: N/A;  . FRACTURE SURGERY     rt arm  . GASTRIC BYPASS  2011   gastric sleeve  . KNEE SURGERY  2005   left  . LAPAROSCOPIC CHOLECYSTECTOMY  2002  . MEDIAL PATELLOFEMORAL LIGAMENT REPAIR Left 06/08/2013   Procedure: DIAGNOSTIC OPERATIVE ARTHROSCOPY, OPEN MEDIAL PATELLA FEMORAL LIGAMENT RECONSTRUCTION;  Surgeon: Meredith Pel, MD;  Location: [facility];  Service: Orthopedics;  Laterality: Left;  with Hamstring autograft  . OVARIAN CYST REMOVAL    .  PARTIAL HYSTERECTOMY    . R arm surgery      OB History    No data available      Home Medications    Prior to Admission medications   Medication Sig Start Date End Date Taking? Authorizing Provider  albuterol (PROVENTIL HFA;VENTOLIN HFA) 108 (90 BASE) MCG/ACT inhaler Inhale 2 puffs into the lungs every 6 (six) hours as needed (chronic bronchitis). ProAir    Historical  Provider, MD  Aspirin-Acetaminophen-Caffeine (GOODY HEADACHE PO) Take 1 packet by mouth daily as needed (pain).    Historical Provider, MD  ciprofloxacin (CIPRO) 500 MG tablet Take 1 tablet (500 mg total) by mouth 2 (two) times daily. 06/07/16   Smiley Houseman, MD  dicyclomine (BENTYL) 10 MG capsule Take 10 mg by mouth 4 (four) times daily -  before meals and at bedtime.    Historical Provider, MD  fluticasone (FLONASE) 50 MCG/ACT nasal spray Place 2 sprays into both nostrils daily. Patient taking differently: Place 2 sprays into both nostrils daily as needed for allergies.  08/12/15   Okey Regal, PA-C  HYDROcodone-acetaminophen (NORCO/VICODIN) 5-325 MG tablet Take 1 tablet by mouth every 6 (six) hours as needed for moderate pain. 06/07/16   Smiley Houseman, MD  metroNIDAZOLE (FLAGYL) 500 MG tablet Take 1 tablet (500 mg total) by mouth 3 (three) times daily. 06/07/16   Smiley Houseman, MD  olmesartan (BENICAR) 40 MG tablet Take 40 mg by mouth daily.    Historical Provider, MD  ondansetron (ZOFRAN-ODT) 4 MG disintegrating tablet Take 1 tablet (4 mg total) by mouth every 8 (eight) hours as needed for nausea or vomiting. 06/07/16   Smiley Houseman, MD  potassium chloride SA (K-DUR,KLOR-CON) 20 MEQ tablet Take 1 tablet (20 mEq total) by mouth daily. 12/05/15   Nicole Pisciotta, PA-C  traZODone (DESYREL) 100 MG tablet TAKE 1 TABLET BY MOUTH DAILY AT BEDTIME FOR SLEEP 12/12/14   Historical Provider, MD  vitamin B-12 (CYANOCOBALAMIN) 1000 MCG tablet Take 1,000 mcg by mouth daily.    Historical Provider, MD    Family History Family History  Problem Relation Age of Onset  . Lupus Mother   . Crohn's disease Father   . Cancer Maternal Grandfather     lung    Social History Social History  Substance Use Topics  . Smoking status: Current Every Day Smoker    Packs/day: 0.50    Years: 10.00    Types: Cigarettes  . Smokeless tobacco: Never Used  . Alcohol use No    Allergies     Penicillins  Review of Systems Review of Systems  Constitutional: Negative for fever.  Respiratory: Positive for cough. Negative for shortness of breath.   Gastrointestinal: Positive for abdominal pain, constipation and rectal pain. Negative for diarrhea, nausea and vomiting.  Genitourinary: Negative for vaginal discharge and vaginal pain.  Musculoskeletal: Positive for back pain.  All other systems reviewed and are negative.  Physical Exam Updated Vital Signs BP (!) 155/100   Pulse 89   Temp 98.5 F (36.9 C) (Oral)   Resp 20   Ht 6' (1.829 m)   Wt 215 lb (97.5 kg)   SpO2 99%   BMI 29.16 kg/m   Physical Exam  Constitutional: She appears well-developed and well-nourished. No distress.  HENT:  Head: Normocephalic and atraumatic.  Mouth/Throat: Oropharynx is clear and moist.  Eyes: Conjunctivae are normal.  Neck: Neck supple.  Cardiovascular: Normal rate, regular rhythm, normal heart sounds and intact distal pulses.  Pulmonary/Chest: Effort normal and breath sounds normal. No respiratory distress.  Abdominal: Soft. Bowel sounds are increased. There is tenderness in the right upper quadrant and right lower quadrant. There is no guarding.  Musculoskeletal: She exhibits no edema.  Lymphadenopathy:    She has no cervical adenopathy.  Neurological: She is alert.  Skin: Skin is warm and dry. She is not diaphoretic.  Psychiatric: She has a normal mood and affect. Her behavior is normal.  Nursing note and vitals reviewed.  ED Treatments / Results  DIAGNOSTIC STUDIES:  Oxygen Saturation is 99% on RA, normal by my interpretation.    COORDINATION OF CARE:  10:49 PM Discussed treatment plan with pt at bedside and pt agreed to plan.  Labs (all labs ordered are listed, but only abnormal results are displayed) Labs Reviewed  URINALYSIS, ROUTINE W REFLEX MICROSCOPIC - Abnormal; Notable for the following:       Result Value   Color, Urine AMBER (*)    APPearance TURBID (*)     Specific Gravity, Urine 1.031 (*)    Bilirubin Urine SMALL (*)    Ketones, ur 15 (*)    Leukocytes, UA SMALL (*)    All other components within normal limits  LIPASE, BLOOD - Abnormal; Notable for the following:    Lipase 53 (*)    All other components within normal limits  COMPREHENSIVE METABOLIC PANEL - Abnormal; Notable for the following:    Glucose, Bld 101 (*)    Calcium 8.3 (*)    Total Protein 5.7 (*)    Albumin 2.9 (*)    All other components within normal limits  CBC - Abnormal; Notable for the following:    MCH 34.1 (*)    All other components within normal limits  URINALYSIS, MICROSCOPIC (REFLEX) - Abnormal; Notable for the following:    Bacteria, UA FEW (*)    Squamous Epithelial / LPF 6-30 (*)    All other components within normal limits  PREGNANCY, URINE    EKG  EKG Interpretation None       Radiology Ct Abdomen Pelvis W Contrast  Result Date: 08/28/2016 CLINICAL DATA:  Right lower quadrant pain and diarrhea EXAM: CT ABDOMEN AND PELVIS WITH CONTRAST TECHNIQUE: Multidetector CT imaging of the abdomen and pelvis was performed using the standard protocol following bolus administration of intravenous contrast. CONTRAST:  136mL ISOVUE-300 IOPAMIDOL (ISOVUE-300) INJECTION 61% COMPARISON:  CT abdomen pelvis 06/07/2016 FINDINGS: Lower chest: Opacities in the left lung base, likely atelectasis. Hepatobiliary: Normal hepatic size and contours without focal liver lesion. No perihepatic ascites. No intra- or extrahepatic biliary dilatation. Status post cholecystectomy. Pancreas: Normal pancreatic contours and enhancement. No peripancreatic fluid collection or pancreatic ductal dilatation. Spleen: Normal. Adrenals/Urinary Tract: Intermediate density lesion of the right adrenal gland measuring 10 x 9 mm. Left renal gland is normal. No hydronephrosis or solid renal mass. Stomach/Bowel: No abnormal bowel dilatation. No bowel wall thickening or adjacent fat stranding to indicate acute  inflammation. No abdominal fluid collection. Status post gastric sleeve procedure. Normal appendix. Vascular/Lymphatic: Minimal aortic atherosclerotic calcification. No abdominal or pelvic adenopathy. Reproductive: Status post hysterectomy. There is a small amount of fluid in the pelvis. No adnexal mass. Musculoskeletal: No lytic or blastic osseous lesion. Normal visualized extrathoracic and extraperitoneal soft tissues. Other: No contributory non-categorized findings. IMPRESSION: 1. Normal appendix.  No acute abnormality of the abdomen or pelvis. 2. Minimal aortic atherosclerosis. 3. Small amount of fluid in the lower pelvis, likely physiologic. 4. Unchanged appearance of right adrenal lesion  as far back as 12/04/2011. Electronically Signed   By: Ulyses Jarred M.D.   On: 08/28/2016 01:36    Procedures Procedures (including critical care time)  Medications Ordered in ED Medications  ondansetron (ZOFRAN) 4 MG/2ML injection (not administered)  sodium chloride 0.9 % bolus 1,000 mL (1,000 mLs Intravenous New Bag/Given 08/27/16 2305)  ondansetron (ZOFRAN) injection 4 mg (4 mg Intravenous Given 08/27/16 2306)  morphine 4 MG/ML injection 4 mg (4 mg Intravenous Given 08/27/16 2310)  dicyclomine (BENTYL) capsule 20 mg (20 mg Oral Given 08/27/16 2304)  iopamidol (ISOVUE-300) 61 % injection 100 mL (100 mLs Intravenous Contrast Given 08/28/16 0058)  LORazepam (ATIVAN) injection 1 mg (1 mg Intravenous Given 08/27/16 2356)     Initial Impression / Assessment and Plan / ED Course  I have reviewed the triage vital signs and the nursing notes.  Pertinent labs & imaging results that were available during my care of the patient were reviewed by me and considered in my medical decision making (see chart for details).  Clinical Course as of Aug 28 199  Tue Aug 27, 2016  2300 Lipase elevation noted. This elevation does not correlate clinically with the location of the patient's pain or tenderness. Lipase: (!) 53 [SJ]    Wed Aug 28, 2016  0140 Patient noted to be sleeping.  [SJ]    Clinical Course User Index [SJ] Lorayne Bender, PA-C     Patient presents with abdominal pain. Patient is nontoxic appearing, afebrile, not tachycardic, not tachypneic, not hypotensive, and maintains adequate SPO2 on room air. Patient has no signs of sepsis or other serious or life-threatening condition.  Very low suspicion for torsion or other reproductive system related pain. Doubt a surgical abdomen. She declined the rectal exam, despite explanation. Patient states she has never had this pain before, however, chart review reveals that she has had multiple instances of RLQ pain, multiple associated abdominal CT scans, and each time she states she has never had this pain previously.  Patient had complete resolution in her pain during her time in the ED. She is able to tolerate PO.  No acute abnormalities noted on abdominal CT. Gastroenterologist follow-up. Return precautions discussed. Patient voices understanding of all instructions and is comfortable with discharge.     Vitals:   08/27/16 2035 08/27/16 2037 08/27/16 2257 08/28/16 0143  BP:  (!) 155/100 (!) 151/99 (!) 126/95  Pulse:  89 (!) 58 62  Resp:  20 18 18   Temp:  98.5 F (36.9 C)    TempSrc:  Oral    SpO2:  99% 98% 92%  Weight: 97.5 kg     Height: 6' (1.829 m)        Final Clinical Impressions(s) / ED Diagnoses   Final diagnoses:  Right lower quadrant abdominal pain    New Prescriptions New Prescriptions   No medications on file   I personally performed the services described in this documentation, which was scribed in my presence. The recorded information has been reviewed and is accurate.   Lorayne Bender, PA-C 08/28/16 [place], PA-C 08/29/16 [place], MD 08/31/16 [phone]

## 2016-08-28 NOTE — Discharge Instructions (Signed)
There were no acute abnormalities on the abdominal CT scan.  Increase your water intake to stay well hydrated. You may add in additional fiber to help with gastrointestinal health.  Please follow up with your gastrointestinal specialist as soon as possible on this matter. Your gastroenterologist should manage any further instances of this type.

## 2016-08-28 NOTE — ED Notes (Signed)
Patient transported to CT 

## 2016-08-28 NOTE — ED Notes (Signed)
Pt continues to state that she cannot drink the contrast and that it makes her abdominal pain worse.  Advised pt that she cannot go for CT until she finishes at least one bottle.  Pt states "I will just chug it."  Advised pt that it is not ideal to chug the contrast because it may come right back up.

## 2016-08-28 NOTE — ED Notes (Signed)
Pt's son standing outside of room and states pt is ready for CT, she just chugged her contrast.

## 2016-08-28 NOTE — ED Notes (Signed)
Pt verbalizes understanding of d/c instructions and denies any further need at this time. 

## 2016-10-02 ENCOUNTER — Institutional Professional Consult (permissible substitution): Payer: Commercial Managed Care - HMO | Admitting: Internal Medicine

## 2016-10-11 ENCOUNTER — Emergency Department
Admission: EM | Admit: 2016-10-11 | Discharge: 2016-10-11 | Disposition: A | Discharge status: Home / Self Care | Payer: Commercial Managed Care - HMO | Source: Home / Self Care | Attending: Family Medicine | Admitting: Family Medicine

## 2016-10-11 ENCOUNTER — Encounter: Payer: Self-pay | Admitting: Emergency Medicine

## 2016-10-11 ENCOUNTER — Emergency Department: Payer: Commercial Managed Care - HMO

## 2016-10-11 DIAGNOSIS — M7662 Achilles tendinitis, left leg: Secondary | ICD-10-CM

## 2016-10-11 DIAGNOSIS — M76822 Posterior tibial tendinitis, left leg: Secondary | ICD-10-CM

## 2016-10-11 MED ORDER — MELOXICAM 15 MG PO TABS: 15.0000 mg | ORAL_TABLET | Freq: Every day | ORAL | 0 refills | Status: DC

## 2016-10-11 NOTE — Discharge Instructions (Signed)
Apply ice pack for 30 minutes two to three times daily. Wear brace for about 2 to 3 weeks.  Begin range of motion and stretching exercises as per instruction sheet.

## 2016-10-11 NOTE — ED Provider Notes (Signed)
[name]    CSN: [id number] Arrival date & time: 10/11/16  1344     History   Chief Complaint Chief Complaint  Patient presents with  . Leg Pain    HPI [name] is a 41 y.o. female.   While driving at work two days ago patient noticed pain in her left distal calf and ankle that has persisted and become worse.  She recalls twisting her ankle and foot while walking in grass about 3 days ago.   The history is provided by the patient.    Past Medical History:  Diagnosis Date  . Adnexal cyst 2013   right  . Anemia 2015   "pernicious" and iron deficiency.  as of 2015/2016 no longer requiring po iron or B12 shots.   . 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 arthroscopy x 2.   . Colitis   . Headache(784.0)    migraines  . Hydradenitis 2001   axillary, multiple surgical I & Ds/excisions.  . Hypertension   . Nausea & vomiting 11/28/2014  . Obesity (BMI 30.0-34.9)   . Pityriasis rosea    pt also gives hx of seborrheic dermatitis.     Patient Active Problem List   Diagnosis Date Noted  . Infectious colitis 11/21/2015  . Chronic low back pain 11/21/2015  . Chronic asthmatic bronchitis ([place]) 11/21/2015  . Bacterial vaginosis 11/21/2015  . Elevated lipase 12/14/2014  . AP (abdominal pain) 12/14/2014  . Nausea with vomiting 12/14/2014  . Abdominal pain, epigastric   . Nausea and vomiting 11/29/2014  . Intractable abdominal pain 11/28/2014  . Hypokalemia 11/28/2014  . HTN (hypertension) 11/28/2014  . Pilonidal abscess 12/03/2013  . Patellar instability of left knee 06/08/2013    Past Surgical History:  Procedure Laterality Date  . ABDOMINAL HYSTERECTOMY    . CYSTECTOMY     2004 axilla bil  . ESOPHAGOGASTRODUODENOSCOPY N/A 11/30/2014   Procedure: ESOPHAGOGASTRODUODENOSCOPY (EGD);  Surgeon: Jerene Bears, MD;  Location: [facility] ENDOSCOPY;  Service: Endoscopy;  Laterality: N/A;  . FRACTURE SURGERY     rt arm  . GASTRIC BYPASS  2011   gastric sleeve  .  KNEE SURGERY  2005   left  . LAPAROSCOPIC CHOLECYSTECTOMY  2002  . MEDIAL PATELLOFEMORAL LIGAMENT REPAIR Left 06/08/2013   Procedure: DIAGNOSTIC OPERATIVE ARTHROSCOPY, OPEN MEDIAL PATELLA FEMORAL LIGAMENT RECONSTRUCTION;  Surgeon: Meredith Pel, MD;  Location: [place];  Service: Orthopedics;  Laterality: Left;  with Hamstring autograft  . OVARIAN CYST REMOVAL    . PARTIAL HYSTERECTOMY    . R arm surgery      OB History    No data available       Home Medications    Prior to Admission medications   Medication Sig Start Date End Date Taking? Authorizing Provider  benazepril-hydrochlorthiazide (LOTENSIN HCT) 10-12.5 MG tablet Take 1 tablet by mouth daily.   Yes [provider]  albuterol (PROVENTIL HFA;VENTOLIN HFA) 108 (90 BASE) MCG/ACT inhaler Inhale 2 puffs into the lungs every 6 (six) hours as needed (chronic bronchitis). ProAir    [provider]  Aspirin-Acetaminophen-Caffeine (GOODY HEADACHE PO) Take 1 packet by mouth daily as needed (pain).    [provider]  dicyclomine (BENTYL) 10 MG capsule Take 10 mg by mouth 4 (four) times daily -  before meals and at bedtime.    [provider]  meloxicam (MOBIC) 15 MG tablet Take 1 tablet (15 mg total) by mouth daily. Take with food each morning 10/11/16  Kandra Nicolas, MD  vitamin B-12 (CYANOCOBALAMIN) 1000 MCG tablet Take 1,000 mcg by mouth daily.    [provider]    Family History Family History  Problem Relation Age of Onset  . Lupus Mother   . Crohn's disease Father   . Cancer Maternal Grandfather        lung    Social History Social History  Substance Use Topics  . Smoking status: Current Every Day Smoker    Packs/day: 0.50    Years: 10.00    Types: Cigarettes  . Smokeless tobacco: Never Used  . Alcohol use No     Allergies   Penicillins   Review of Systems Review of Systems  Constitutional: Negative for chills, diaphoresis and fatigue.  HENT: Negative.     Eyes: Negative.   Respiratory: Negative for cough, chest tightness and shortness of breath.   Cardiovascular: Negative for chest pain.  Gastrointestinal: Negative.   Genitourinary: Negative.   Musculoskeletal:       Left lower leg pain  Skin: Negative.   Neurological: Negative.      Physical Exam Triage Vital Signs ED Triage Vitals  Enc Vitals Group     BP 10/11/16 1409 (!) 142/90     Pulse Rate 10/11/16 1409 81     Resp --      Temp 10/11/16 1409 98.6 F (37 C)     Temp Source 10/11/16 1409 Oral     SpO2 10/11/16 1409 98 %     Weight 10/11/16 1410 209 lb (94.8 kg)     Height 10/11/16 1410 6' (1.829 m)     Head Circumference --      Peak Flow --      Pain Score 10/11/16 1410 6     Pain Loc --      Pain Edu? --      Excl. in [place]? --    No data found.   Updated Vital Signs BP (!) 142/90 (BP Location: Right Arm)   Pulse 81   Temp 98.6 F (37 C) (Oral)   Ht 6' (1.829 m)   Wt 209 lb (94.8 kg)   SpO2 98%   BMI 28.35 kg/m   Visual Acuity Right Eye Distance:   Left Eye Distance:   Bilateral Distance:    Right Eye Near:   Left Eye Near:    Bilateral Near:     Physical Exam  Constitutional: She appears well-nourished. No distress.  HENT:  Head: Normocephalic.  Eyes: Pupils are equal, round, and reactive to light.  Cardiovascular: Normal rate and normal heart sounds.   Pulmonary/Chest: Breath sounds normal.  Musculoskeletal:       Left ankle: She exhibits normal range of motion, no swelling and no ecchymosis. Achilles tendon exhibits pain.       Legs:      Feet:  Left ankle has distinct tenderness at insertion of the achilles tendon. Left foot has tenderness to palpation over posterior tibial tendon extending into arch.  Pain elicited by resisted plantar flexion and resisted inversion of the ankle.  The left posterior distal calf has distinct tenderness to palpation.  Neurological: She is alert.  Skin: Skin is warm and dry. No erythema.  Nursing note and  vitals reviewed.    UC Treatments / Results  Labs (all labs ordered are listed, but only abnormal results are displayed) Labs Reviewed - No data to display  EKG  EKG Interpretation None       Radiology US  Venous Img Lower Unilateral Left  Result Date: 10/11/2016 CLINICAL DATA:  Left calf and ankle pain with tenderness for 2 day EXAM: LEFT LOWER EXTREMITY VENOUS DUPLEX ULTRASOUND TECHNIQUE: Doppler venous assessment of the left lower extremity deep venous system was performed, including characterization of spectral flow, compressibility, and phasicity. COMPARISON:  None. FINDINGS: There is complete compressibility of the left common femoral, femoral, and popliteal veins. Doppler analysis demonstrates respiratory phasicity and augmentation of flow with calf compression. No obvious superficial vein or calf vein thrombosis. IMPRESSION: No evidence of left lower extremity DVT. Electronically Signed   By: Marybelle Killings M.D.   On: 10/11/2016 15:36    Procedures Procedures (including critical care time)  Medications Ordered in UC Medications - No data to display   Initial Impression / Assessment and Plan / UC Course  I have reviewed the triage vital signs and the nursing notes.  Pertinent labs & imaging results that were available during my care of the patient were reviewed by me and considered in my medical decision making (see chart for details).    Negative ultrasound left lower extremity reassuring. Begin Mobic 15mg  daily. Dispensed AirCast stirrup splint. Apply ice pack for 30 minutes two to three times daily. Wear brace for about 2 to 3 weeks.  Begin range of motion and stretching exercises as per instruction sheet.  Followup with Dr. Aundria Mems or Dr. Lynne Leader ([facility]) if not improving about two weeks.     Final Clinical Impressions(s) / UC Diagnoses   Final diagnoses:  Tendonitis, Achilles, left  Posterior tibial tendinitis of left lower  extremity    New Prescriptions New Prescriptions   MELOXICAM (MOBIC) 15 MG TABLET    Take 1 tablet (15 mg total) by mouth daily. Take with food each morning     Kandra Nicolas, MD 10/11/16 [phone]

## 2016-10-11 NOTE — ED Triage Notes (Signed)
Left calf and ankle pain. Started 2.5 days ago, ankle hurts all the time calf is intermittent pain. Hurts a little worse when laying down elevation doesn't help. 6/10

## 2016-10-28 DIAGNOSIS — K611 Rectal abscess: Secondary | ICD-10-CM | POA: Insufficient documentation

## 2016-11-04 ENCOUNTER — Other Ambulatory Visit: Payer: Self-pay | Admitting: General Surgery

## 2016-11-07 ENCOUNTER — Emergency Department (HOSPITAL_COMMUNITY)
Admission: EM | Admit: 2016-11-07 | Discharge: 2016-11-07 | Disposition: A | Discharge status: Home / Self Care | Payer: 59 | Attending: Emergency Medicine | Admitting: Emergency Medicine

## 2016-11-07 DIAGNOSIS — I10 Essential (primary) hypertension: Secondary | ICD-10-CM | POA: Insufficient documentation

## 2016-11-07 DIAGNOSIS — E860 Dehydration: Secondary | ICD-10-CM | POA: Insufficient documentation

## 2016-11-07 DIAGNOSIS — F17210 Nicotine dependence, cigarettes, uncomplicated: Secondary | ICD-10-CM | POA: Diagnosis not present

## 2016-11-07 DIAGNOSIS — R55 Syncope and collapse: Secondary | ICD-10-CM

## 2016-11-07 DIAGNOSIS — Z7982 Long term (current) use of aspirin: Secondary | ICD-10-CM | POA: Diagnosis not present

## 2016-11-07 DIAGNOSIS — Z79899 Other long term (current) drug therapy: Secondary | ICD-10-CM | POA: Insufficient documentation

## 2016-11-07 MED ORDER — METHOCARBAMOL 750 MG PO TABS: 750.0000 mg | ORAL_TABLET | Freq: Four times a day (QID) | ORAL | 2 refills | Status: DC | PRN

## 2016-11-07 MED ORDER — SODIUM CHLORIDE 0.9 % IV BOLUS (SEPSIS)
1000.0000 mL | Freq: Once | INTRAVENOUS | Status: AC
  Administered 2016-11-07: 1000 mL via INTRAVENOUS

## 2016-11-07 MED ORDER — OXYCODONE HCL 5 MG PO TABS: 5.0000 mg | ORAL_TABLET | ORAL | 0 refills | Status: DC | PRN

## 2016-11-07 MED ORDER — FENTANYL CITRATE (PF) 100 MCG/2ML IJ SOLN
50.0000 ug | Freq: Once | INTRAMUSCULAR | Status: AC
  Administered 2016-11-07: 50 ug via INTRAVENOUS
  Filled 2016-11-07: qty 2

## 2016-11-07 NOTE — Progress Notes (Signed)
Assessment Status post pilonidal cystectomy by marsupialization technique-wound looks good.  She has not been eating or drinking much.  She has been receiving IV fluids.  Her wound looks good  Plan: Discharge from the emergency department.  I emphasized the importance of ambulation as well as eating we discussed wound care.  Return visit to the office in 2 weeks.  She will be given a prescription for oxycodone and Robaxin.   LOS: 0 days        Chief Complaint/Subjective: She was due to have an appointment today in the office I was called by the emergency department physician.  She states she has been lying in bed since her operation 3 days ago.  He has not been eating much but she has been drinking.  She has been taking the pain medication frequently.  She has not had any fever.  No significant bleeding or drainage from the wound.  She has been having pain but states it is more like a spasm-type pain.  Objective: Vital signs in last 24 hours: Pulse Rate:  [66] 66 (06/21 1458) BP: (101)/(68) 101/68 (06/21 1456) SpO2:  [97 %] 97 % (06/21 1458)    Intake/Output from previous day: No intake/output data recorded. Intake/Output this shift: No intake/output data recorded.  PE: General- In NAD.  Awake and alert.  The gluteal cleft wound is clean with good granulation tissue forming.  There is no purulence.  There is no surrounding erythema.  The wound was repacked.  Lab Results:  No results for input(s): WBC, HGB, HCT, PLT in the last 72 hours. BMET No results for input(s): NA, K, CL, CO2, GLUCOSE, BUN, CREATININE, CALCIUM in the last 72 hours. PT/INR No results for input(s): LABPROT, INR in the last 72 hours. Comprehensive Metabolic Panel:    Component Value Date/Time   NA 141 08/27/2016 2149   NA 139 06/07/2016 1337   K 3.5 08/27/2016 2149   K 3.1 (L) 06/07/2016 1337   CL 107 08/27/2016 2149   CL 105 06/07/2016 1337   CO2 29 08/27/2016 2149   CO2 28 06/07/2016 1337   BUN 7  08/27/2016 2149   BUN 7 06/07/2016 1337   CREATININE 0.81 08/27/2016 2149   CREATININE 0.70 06/07/2016 1337   GLUCOSE 101 (H) 08/27/2016 2149   GLUCOSE 118 (H) 06/07/2016 1337   CALCIUM 8.3 (L) 08/27/2016 2149   CALCIUM 8.5 (L) 06/07/2016 1337   AST 28 08/27/2016 2149   AST 23 06/07/2016 1337   ALT 24 08/27/2016 2149   ALT 21 06/07/2016 1337   ALKPHOS 64 08/27/2016 2149   ALKPHOS 67 06/07/2016 1337   BILITOT 0.3 08/27/2016 2149   BILITOT 0.8 06/07/2016 1337   PROT 5.7 (L) 08/27/2016 2149   PROT 6.5 06/07/2016 1337   ALBUMIN 2.9 (L) 08/27/2016 2149   ALBUMIN 3.3 (L) 06/07/2016 1337     Studies/Results: No results found.  Anti-infectives: Anti-infectives    None       [name] J 11/07/2016

## 2016-11-07 NOTE — Discharge Instructions (Addendum)
Walk frequently.  Shower daily and then change dressing as we discussed.  Eat!  Stay well hydrated.  Call office and make an appointment to be seen the week of July 4th.

## 2016-11-07 NOTE — ED Provider Notes (Signed)
[facility] DEPT Provider Note   CSN: [id number] Arrival date & time: 11/07/16  1448     History   Chief Complaint Chief Complaint  Patient presents with  . Loss of Consciousness    HPI [name] is a 41 y.o. female.  The history is provided by the patient.  Loss of Consciousness   This is a recurrent problem. The current episode started 3 to 5 hours ago. Episode frequency: once yesterday and once today. She lost consciousness for a period of less than one minute. The problem is associated with standing up. Associated symptoms include light-headedness, malaise/fatigue and nausea. Pertinent negatives include abdominal pain, chest pain, confusion, diaphoresis, headaches, palpitations, slurred speech, vomiting and weakness. She has tried bed rest and certain positions for the symptoms. The treatment provided moderate relief. Past medical history comments: recent marsupation of pilonidal cyst on percocet for pain.   (Been trying to hydrate but has been lying around most of the time since her surgery on Tuesday. She has had very minimal food intake since her surgery.   She reports that when she tries to stand she feels severe pain around her surgical area, causing her to feel lightheaded.   Past Medical History:  Diagnosis Date  . Adnexal cyst 2013   right  . Anemia 2015   "pernicious" and iron deficiency.  as of 2015/2016 no longer requiring po iron or B12 shots.   . 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 arthroscopy x 2.   . Colitis   . Headache(784.0)    migraines  . Hydradenitis 2001   axillary, multiple surgical I & Ds/excisions.  . Hypertension   . Nausea & vomiting 11/28/2014  . Obesity (BMI 30.0-34.9)   . Pityriasis rosea    pt also gives hx of seborrheic dermatitis.     Patient Active Problem List   Diagnosis Date Noted  . Infectious colitis 11/21/2015  . Chronic low back pain 11/21/2015  . Chronic asthmatic bronchitis ([place]) 11/21/2015  . Bacterial  vaginosis 11/21/2015  . Elevated lipase 12/14/2014  . AP (abdominal pain) 12/14/2014  . Nausea with vomiting 12/14/2014  . Abdominal pain, epigastric   . Nausea and vomiting 11/29/2014  . Intractable abdominal pain 11/28/2014  . Hypokalemia 11/28/2014  . HTN (hypertension) 11/28/2014  . Pilonidal abscess 12/03/2013  . Patellar instability of left knee 06/08/2013    Past Surgical History:  Procedure Laterality Date  . ABDOMINAL HYSTERECTOMY    . CYSTECTOMY     2004 axilla bil  . ESOPHAGOGASTRODUODENOSCOPY N/A 11/30/2014   Procedure: ESOPHAGOGASTRODUODENOSCOPY (EGD);  Surgeon: Jerene Bears, MD;  Location: [facility] ENDOSCOPY;  Service: Endoscopy;  Laterality: N/A;  . FRACTURE SURGERY     rt arm  . GASTRIC BYPASS  2011   gastric sleeve  . KNEE SURGERY  2005   left  . LAPAROSCOPIC CHOLECYSTECTOMY  2002  . MEDIAL PATELLOFEMORAL LIGAMENT REPAIR Left 06/08/2013   Procedure: DIAGNOSTIC OPERATIVE ARTHROSCOPY, OPEN MEDIAL PATELLA FEMORAL LIGAMENT RECONSTRUCTION;  Surgeon: Meredith Pel, MD;  Location: [facility];  Service: Orthopedics;  Laterality: Left;  with Hamstring autograft  . OVARIAN CYST REMOVAL    . PARTIAL HYSTERECTOMY    . R arm surgery      OB History    No data available       Home Medications    Prior to Admission medications   Medication Sig Start Date End Date Taking? Authorizing Provider  albuterol (PROVENTIL HFA;VENTOLIN HFA) 108 (90 BASE) MCG/ACT inhaler Inhale 2  puffs into the lungs every 6 (six) hours as needed (for chronic bronchitis symptoms).    Yes [provider]  aspirin EC 81 MG tablet Take 81 mg by mouth daily. 10/10/16 11/09/16 Yes [provider]  Aspirin-Acetaminophen-Caffeine (GOODY HEADACHE PO) Take 1 packet by mouth daily as needed (pain).   Yes [provider]  atorvastatin (LIPITOR) 40 MG tablet Take 40 mg by mouth at bedtime. 10/10/16  Yes [provider]  dicyclomine (BENTYL) 10 MG capsule Take 10 mg by mouth 4  (four) times daily -  before meals and at bedtime.   Yes [provider]  docusate sodium (COLACE) 100 MG capsule Take 100 mg by mouth 2 (two) times daily as needed for mild constipation.   Yes [provider]  Fluticasone-Salmeterol (ADVAIR) 250-50 MCG/DOSE AEPB Inhale 1 puff into the lungs 2 (two) times daily as needed (for seasonal allergy flares).  01/04/16  Yes [provider]  hydrochlorothiazide (HYDRODIURIL) 25 MG tablet Take 25 mg by mouth daily. 11/02/16  Yes [provider]  oxyCODONE (OXY IR/ROXICODONE) 5 MG immediate release tablet Take 5-10 mg by mouth See admin instructions. 5 mg two times a day as needed for pain and 10 mg at bedtime as needed for pain   Yes [provider]  POTASSIUM PO Take 1 tablet by mouth 2 (two) times daily.   Yes [provider]  traZODone (DESYREL) 100 MG tablet Take 100 mg by mouth at bedtime as needed for sleep.  11/02/16  Yes [provider]  vitamin B-12 (CYANOCOBALAMIN) 1000 MCG tablet Take 1,000 mcg by mouth daily.   Yes [provider]  meloxicam (MOBIC) 15 MG tablet Take 1 tablet (15 mg total) by mouth daily. Take with food each morning Patient not taking: Reported on 11/07/2016 10/11/16   Kandra Nicolas, MD  methocarbamol (ROBAXIN) 750 MG tablet Take 1 tablet (750 mg total) by mouth 4 (four) times daily as needed (use for muscle cramps/pain). 11/07/16   Jackolyn Confer, MD  oxyCODONE (OXY IR/ROXICODONE) 5 MG immediate release tablet Take 1-2 tablets (5-10 mg total) by mouth every 4 (four) hours as needed for moderate pain, severe pain or breakthrough pain. 11/07/16   Jackolyn Confer, MD    Family History Family History  Problem Relation Age of Onset  . Lupus Mother   . Crohn's disease Father   . Cancer Maternal Grandfather        lung    Social History Social History  Substance Use Topics  . Smoking status: Current Every Day Smoker    Packs/day: 0.50    Years: 10.00     Types: Cigarettes  . Smokeless tobacco: Never Used  . Alcohol use No     Allergies   Penicillins   Review of Systems Review of Systems  Constitutional: Positive for malaise/fatigue. Negative for diaphoresis.  Cardiovascular: Positive for syncope. Negative for chest pain and palpitations.  Gastrointestinal: Positive for nausea. Negative for abdominal pain and vomiting.  Neurological: Positive for light-headedness. Negative for weakness and headaches.  Psychiatric/Behavioral: Negative for confusion.   All other systems are reviewed and are negative for acute change except as noted in the HPI   Physical Exam Updated Vital Signs BP 122/90   Pulse 62   Resp 18   SpO2 100%   Physical Exam  Constitutional: She is oriented to person, place, and time. She appears well-developed and well-nourished. No distress.  HENT:  Head: Normocephalic and atraumatic.  Nose: Nose normal.  Mouth/Throat: Mucous membranes are dry.  Eyes: Conjunctivae and EOM are normal. Pupils are equal, round, and reactive to light. Right eye exhibits no discharge. Left eye exhibits no discharge. No scleral icterus.  Neck: Normal range of motion. Neck supple.  Cardiovascular: Normal rate and regular rhythm.  Exam reveals no gallop and no friction rub.   No murmur heard. Pulmonary/Chest: Effort normal and breath sounds normal. No stridor. No respiratory distress. She has no rales.  Abdominal: Soft. She exhibits no distension. There is no tenderness.  Musculoskeletal: She exhibits no edema or tenderness.  Neurological: She is alert and oriented to person, place, and time.  Skin: Skin is warm and dry. No rash noted. She is not diaphoretic. No erythema.     Psychiatric: She has a normal mood and affect.  Vitals reviewed.    ED Treatments / Results  Labs (all labs ordered are listed, but only abnormal results are displayed) Labs Reviewed - No data to display  EKG  EKG Interpretation  Date/Time:  Thursday  November 07 2016 16:08:34 EDT Ventricular Rate:  56 PR Interval:    QRS Duration: 95 QT Interval:  503 QTC Calculation: 486 R Axis:   94 Text Interpretation:  Sinus rhythm Probable left atrial enlargement No significant change since last tracing Confirmed by Addison Lank [phone]) on 11/07/2016 4:52:54 PM       Radiology No results found.  Procedures Procedures (including critical care time)  Medications Ordered in ED Medications  sodium chloride 0.9 % bolus 1,000 mL (0 mLs Intravenous Stopped 11/07/16 1711)  fentaNYL (SUBLIMAZE) injection 50 mcg (50 mcg Intravenous Given 11/07/16 1641)     Initial Impression / Assessment and Plan / ED Course  I have reviewed the triage vital signs and the nursing notes.  Pertinent labs & imaging results that were available during my care of the patient were reviewed by me and considered in my medical decision making (see chart for details).     Patient appears to be dehydrated given her dry mucous membranes. Her syncopal episodes are likely secondary to this with superimposed vasovagal from wound pain. Patient denied any cardiac symptoms. EKG Without acute ischemic changes, dysrhythmias, blocks..  Wound is well-appearing without evidence of superimposed infection. Spoke with Dr. Zella Richer, who will come to the ED to evaluate the patient. He addressed the patient's wound and provided the patient with additional pain medicine and muscle relaxers for spasms.  We'll provide patient with IV fluids and then reassess.  On reassessment patient has significantly improved symptomatically. Requesting to be discharged home.  The patient is safe for discharge with strict return precautions.  Final Clinical Impressions(s) / ED Diagnoses   Final diagnoses:  Dehydration  Vasovagal syncope   Disposition: Discharge  Condition: Good  I have discussed the results, Dx and Tx plan with the patient who expressed understanding and agree(s) with the plan.  Discharge instructions discussed at great length. The patient was given strict return precautions who verbalized understanding of the instructions. No further questions at time of discharge.    Current Discharge Medication List    START taking these medications   Details  methocarbamol (ROBAXIN) 750 MG tablet Take 1 tablet (750 mg total) by mouth 4 (four) times daily as needed (use for muscle cramps/pain). Qty: 30 tablet, Refills: 2        Follow Up: Jackolyn Confer, MD [place] [address] [phone]  In 2 weeks as scheduled      Shloime Keilman,  Grayce Sessions, MD 11/07/16 1731

## 2016-11-07 NOTE — ED Triage Notes (Addendum)
Pt arrived via EMS c/o syncope and lower back pain. Pt underwent lower back surgery on Monday (june 18). Per EMS, Pt states has been taking oxycodone as prescribed to control pain but pain has not been controlled. Pt stated she takes double dose (10mg ) of her oxycodone at night to help her sleep better. Pt states she passed out today while at home while getting ready for today's surgery follow up appt. Pt is alert and oriented but appears to be weak/lethargic. Pt states she has chronic bronchitis and has been unable to take her usual breathing tx bc she has been unable to cough due to the surgical pain.

## 2016-11-08 NOTE — ED Notes (Signed)
1mcg fentanyl wasted in sharps with Lucia Estelle RN

## 2016-11-08 NOTE — ED Notes (Signed)
72mcg wasted in sharps with Chelsea F.

## 2017-03-28 IMAGING — MR MR 3D RECON AT SCANNER
12 series · 16 of 16 positions shown · non-contrast
Comparison: CT scan from 11/25/2014. Abdominal ultrasound exam from
11/28/2014.

CLINICAL DATA: Two month history of abdominal pain with elevated
lipase. Personal history of gastric sleeve surgery and
cholecystectomy. Subsequent encounter.

EXAM:
MRI ABDOMEN WITHOUT CONTRAST  (INCLUDING MRCP)
TECHNIQUE: Multiplanar multisequence MR imaging of the abdomen was performed.
Heavily T2-weighted images of the biliary and pancreatic ducts were
obtained, and three-dimensional MRCP images were rendered by post
processing.

[Series 3: T2 fat-sat · axial · 5.0mm · 0.94mm/px · 1 of 51 slices shown]
[im 1/51]
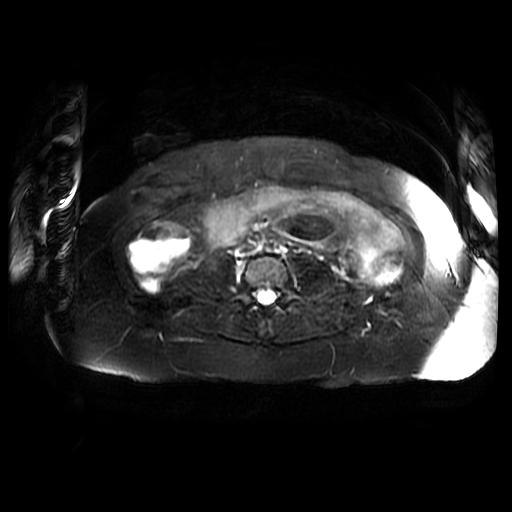

[Series 4: MRCP · coronal · 1.8mm · 0.62mm/px · 1 of 121 slices shown (1 of 3)]
[im 1/121]
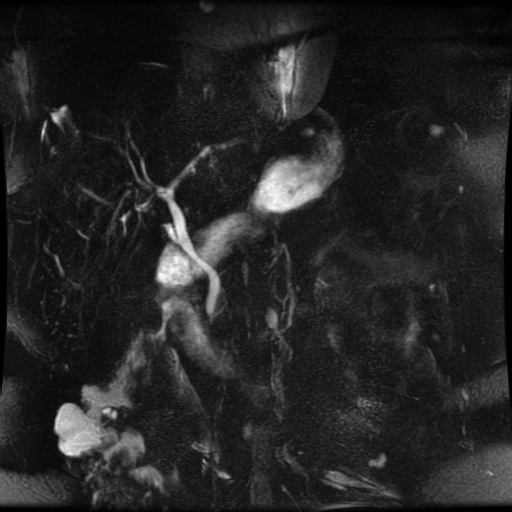

[Series 5: MRCP · coronal · 2.0mm · 0.70mm/px · 1 of 54 slices shown (2 of 3)]
[im 1/54]
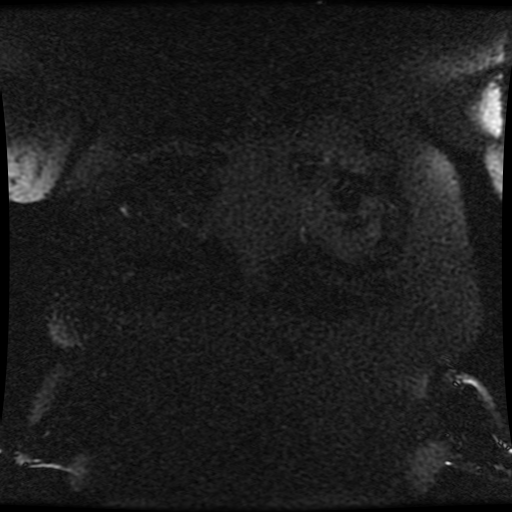

[Series 6: DWI b500 · axial · 6.0mm · 1.80mm/px · 1 of 72 slices shown]
[im 1/72]
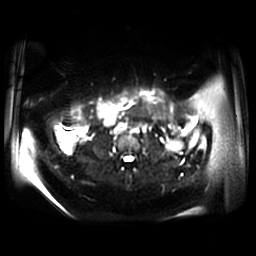

[Series 7: ax dualecho · axial · 5.0mm · 0.94mm/px · z∈[-47,+203]mm · 2 of 102 slices shown]
[im 1/102]
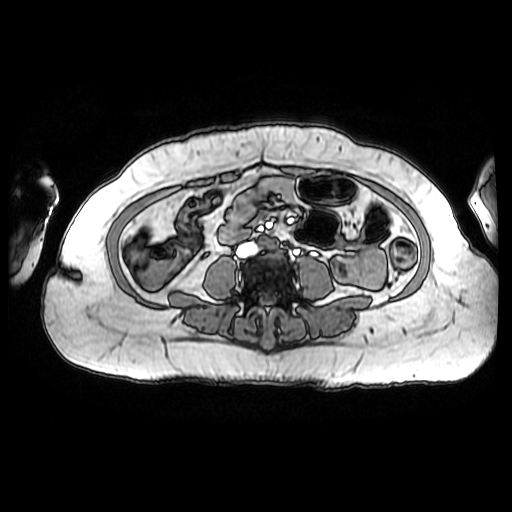
[im 102/102]
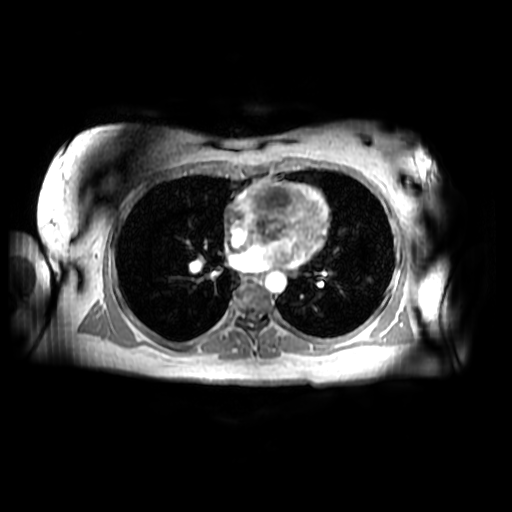

[Series 8: MRCP · coronal · 40.0mm · 0.70mm/px · 1 of 9 slices shown (3 of 3)]
[im 1/9]
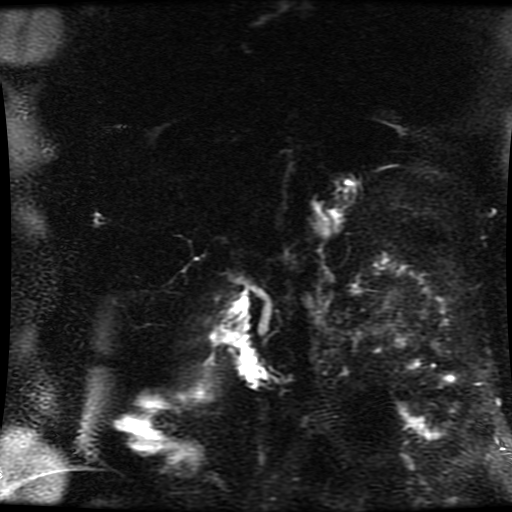

[Series 9: bSSFP fat-sat · coronal · 5.0mm · 0.86mm/px · 1 of 39 slices shown]
[im 1/39]
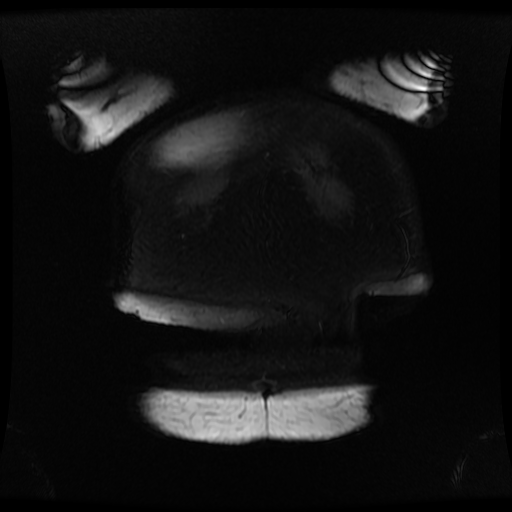

[Series 10: T2 · axial · 5.0mm · 0.94mm/px · 1 of 51 slices shown]
[im 1/51]
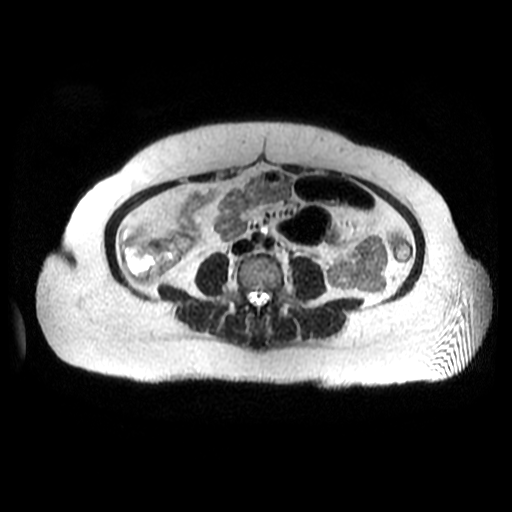

[Series 11: T1 dynamic · axial · 5.8mm · 0.78mm/px · 1 of 52 slices shown]
[im 1/52]
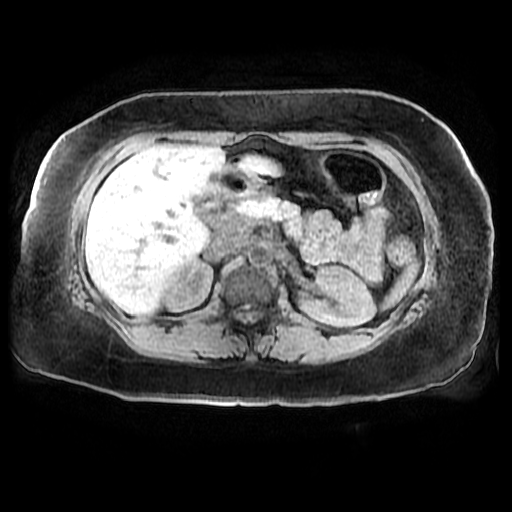

[Series 400: reformatted · axial · 1.8mm · 0.62mm/px · z∈[+75,+205]mm · 2 of 83 slices shown (1 of 2)]
[im 1/83]
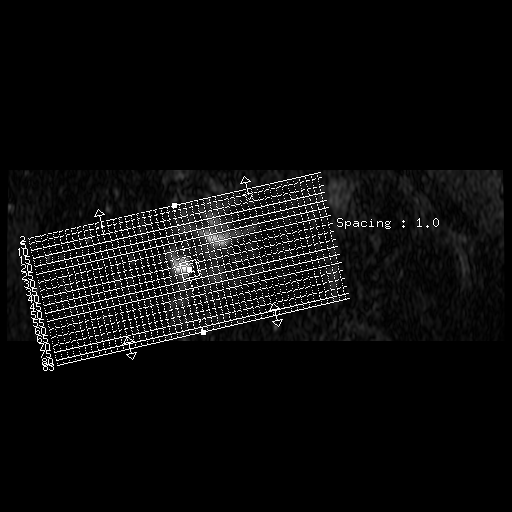
[im 83/83]
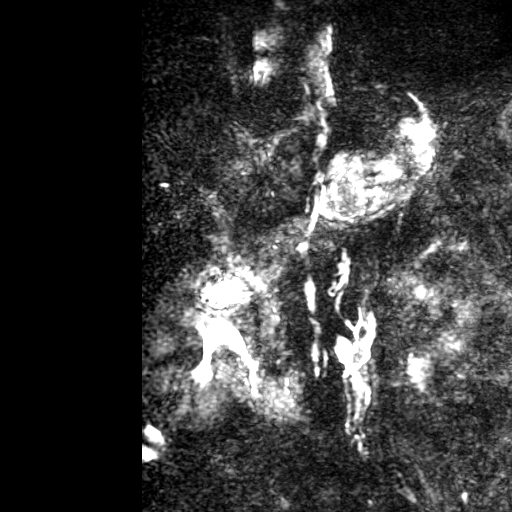

[Series 401: reformatted · coronal · 100.0mm · 0.51mm/px · 3 of 177 slices shown (2 of 2)]
[im 1/177]
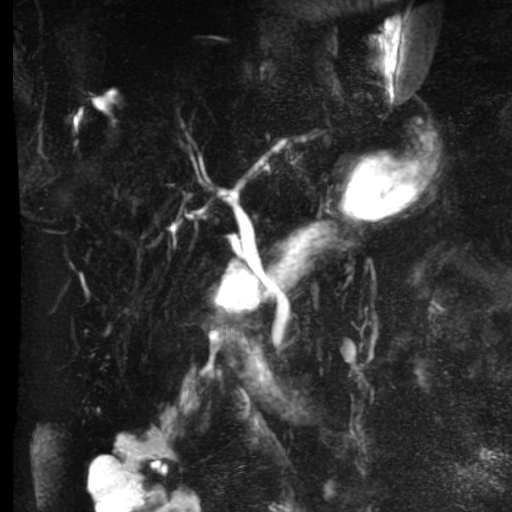
[im 89/177]
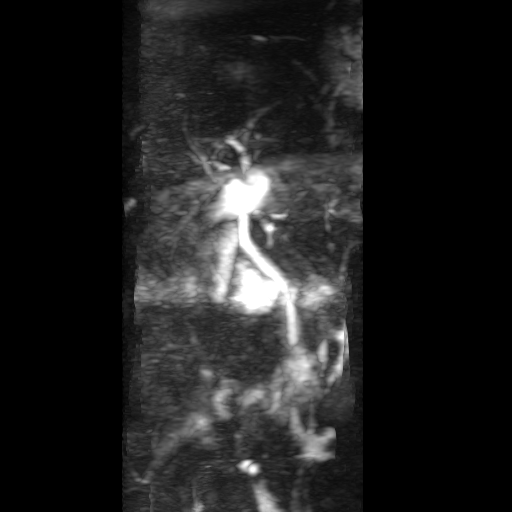
[im 177/177]
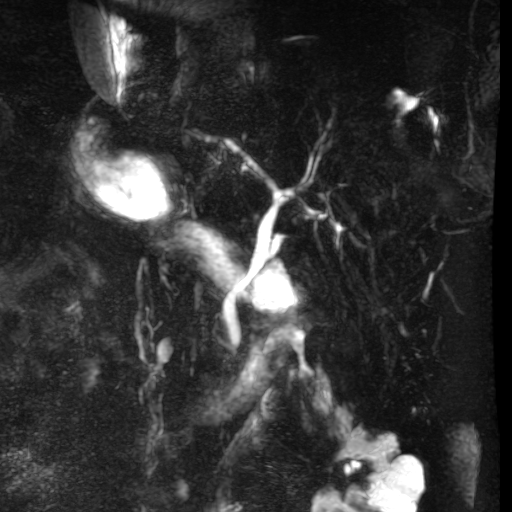

[Series 600: DWI · axial · 6.0mm · 1.80mm/px · 1 of 36 slices shown]
[im 1/36]
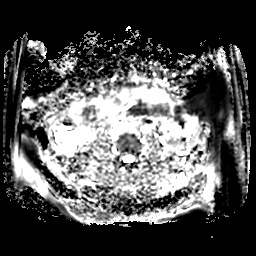

[16 of 16 positions shown; findings below may reference images not displayed]

FINDINGS: Lower chest:  Unremarkable.

Hepatobiliary: No focal abnormality in the liver on this study
without intravenous contrast. No evidence of hepatomegaly.
Gallbladder is surgically absent. There is no intrahepatic biliary
duct dilatation. Extrahepatic common duct measures 7 mm. The common
bile duct in the head of the pancreas is 7 mm in diameter. No
evidence for choledocholithiasis.

Pancreas: No focal mass lesion. No dilatation of the main duct. No
intraparenchymal cyst. No peripancreatic edema.

Spleen: No splenomegaly.  No focal mass lesion.

Adrenals/Urinary Tract: 13 mm right adrenal nodule shows loss of
signal on out of phase T1 weighted imaging compared to the in phase
sequence, consistent with benign adrenal adenoma. Left adrenal gland
is normal in appearance. No evidence for abnormality in either
kidney. No hydronephrosis.

Stomach/Bowel: Postsurgical changes noted in the stomach, consistent
with reported history of gastric sleeve procedure. Duodenum is
normally positioned as is the ligament of Treitz.

Vascular/Lymphatic: No abdominal aortic aneurysm. There is no
gastrohepatic or hepatoduodenal ligament lymphadenopathy. No
intraperitoneal or retroperitoneal lymphadenopy.

Other: No intraperitoneal free fluid.

Musculoskeletal: No evidence for abnormal marrow signal within the
visualized bony anatomy.
IMPRESSION: Minimal distention of the extrahepatic common duct in this patient
status post cholecystectomy. No evidence for choledocholithiasis.

No evidence for mass lesion in the head of the pancreas. No
associated dilatation of the main pancreatic duct.

## 2017-08-06 ENCOUNTER — Encounter (HOSPITAL_BASED_OUTPATIENT_CLINIC_OR_DEPARTMENT_OTHER): Payer: Self-pay

## 2017-08-06 ENCOUNTER — Emergency Department (HOSPITAL_BASED_OUTPATIENT_CLINIC_OR_DEPARTMENT_OTHER)
Admission: EM | Admit: 2017-08-06 | Discharge: 2017-08-06 | Disposition: A | Discharge status: Home / Self Care | Payer: 59 | Attending: Emergency Medicine | Admitting: Emergency Medicine

## 2017-08-06 ENCOUNTER — Other Ambulatory Visit: Payer: Self-pay

## 2017-08-06 DIAGNOSIS — F17210 Nicotine dependence, cigarettes, uncomplicated: Secondary | ICD-10-CM | POA: Diagnosis not present

## 2017-08-06 DIAGNOSIS — I10 Essential (primary) hypertension: Secondary | ICD-10-CM | POA: Diagnosis not present

## 2017-08-06 DIAGNOSIS — Z79899 Other long term (current) drug therapy: Secondary | ICD-10-CM | POA: Insufficient documentation

## 2017-08-06 DIAGNOSIS — L0231 Cutaneous abscess of buttock: Secondary | ICD-10-CM | POA: Diagnosis present

## 2017-08-06 HISTORY — DX: Pilonidal cyst without abscess: L05.91

## 2017-08-06 MED ORDER — LIDOCAINE-EPINEPHRINE (PF) 2 %-1:200000 IJ SOLN
20.0000 mL | Freq: Once | INTRAMUSCULAR | Status: AC
  Administered 2017-08-06: 20 mL via INTRADERMAL
  Filled 2017-08-06: qty 20

## 2017-08-06 MED ORDER — HYDROMORPHONE HCL 1 MG/ML IJ SOLN
2.0000 mg | Freq: Once | INTRAMUSCULAR | Status: AC
  Administered 2017-08-06: 2 mg via INTRAMUSCULAR
  Filled 2017-08-06: qty 2

## 2017-08-06 MED ORDER — SULFAMETHOXAZOLE-TRIMETHOPRIM 800-160 MG PO TABS: 1.0000 | ORAL_TABLET | Freq: Two times a day (BID) | ORAL | 0 refills | Status: AC

## 2017-08-06 NOTE — ED Triage Notes (Signed)
C/o "cyst" to left buttock x 3 days-NAD-steady gait

## 2017-08-06 NOTE — ED Provider Notes (Signed)
[facility] EMERGENCY DEPARTMENT Provider Note   CSN: [id number] Arrival date & time: 08/06/17  2033     History   Chief Complaint Chief Complaint  Patient presents with  . Cyst    HPI [name] is a 42 y.o. female.  HPI  42 year old female with a history of multiple prior abscesses presents with a recurrent buttocks abscess.  She has had multiple pilonidal cyst abscesses and had an operation by central, surgery last year.  Now she is having an abscess that formed 3 days ago and has been growing.  She has had fevers up to 101.  She states the pain is severe.  She has not noticed any drainage.  It is on her left buttocks.  It is in a different location than her pilonidal cyst which was midline.  Has tried compresses without relief.  Past Medical History:  Diagnosis Date  . Adnexal cyst 2013   right  . Anemia 2015   "pernicious" and iron deficiency.  as of 2015/2016 no longer requiring po iron or B12 shots.   . 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 arthroscopy x 2.   . Colitis   . Headache(784.0)    migraines  . Hydradenitis 2001   axillary, multiple surgical I & Ds/excisions.  . Hypertension   . Nausea & vomiting 11/28/2014  . Obesity (BMI 30.0-34.9)   . Pilonidal cyst   . Pityriasis rosea    pt also gives hx of seborrheic dermatitis.     Patient Active Problem List   Diagnosis Date Noted  . Infectious colitis 11/21/2015  . Chronic low back pain 11/21/2015  . Chronic asthmatic bronchitis ([place]) 11/21/2015  . Bacterial vaginosis 11/21/2015  . Elevated lipase 12/14/2014  . AP (abdominal pain) 12/14/2014  . Nausea with vomiting 12/14/2014  . Abdominal pain, epigastric   . Nausea and vomiting 11/29/2014  . Intractable abdominal pain 11/28/2014  . Hypokalemia 11/28/2014  . HTN (hypertension) 11/28/2014  . Pilonidal abscess 12/03/2013  . Patellar instability of left knee 06/08/2013    Past Surgical History:  Procedure Laterality Date  . ABDOMINAL  HYSTERECTOMY    . CYSTECTOMY     2004 axilla bil  . ESOPHAGOGASTRODUODENOSCOPY N/A 11/30/2014   Procedure: ESOPHAGOGASTRODUODENOSCOPY (EGD);  Surgeon: Jerene Bears, MD;  Location: [facility] ENDOSCOPY;  Service: Endoscopy;  Laterality: N/A;  . FRACTURE SURGERY     rt arm  . GASTRIC BYPASS  2011   gastric sleeve  . KNEE SURGERY  2005   left  . LAPAROSCOPIC CHOLECYSTECTOMY  2002  . MEDIAL PATELLOFEMORAL LIGAMENT REPAIR Left 06/08/2013   Procedure: DIAGNOSTIC OPERATIVE ARTHROSCOPY, OPEN MEDIAL PATELLA FEMORAL LIGAMENT RECONSTRUCTION;  Surgeon: Meredith Pel, MD;  Location: [facility];  Service: Orthopedics;  Laterality: Left;  with Hamstring autograft  . OVARIAN CYST REMOVAL    . PARTIAL HYSTERECTOMY    . R arm surgery      OB History    No data available       Home Medications    Prior to Admission medications   Medication Sig Start Date End Date Taking? Authorizing Provider  albuterol (PROVENTIL HFA;VENTOLIN HFA) 108 (90 BASE) MCG/ACT inhaler Inhale 2 puffs into the lungs every 6 (six) hours as needed (for chronic bronchitis symptoms).     [provider]  Aspirin-Acetaminophen-Caffeine (GOODY HEADACHE PO) Take 1 packet by mouth daily as needed (pain).    [provider]  atorvastatin (LIPITOR) 40 MG tablet Take 40 mg by mouth at  bedtime. 10/10/16   [provider]  dicyclomine (BENTYL) 10 MG capsule Take 10 mg by mouth 4 (four) times daily -  before meals and at bedtime.    [provider]  docusate sodium (COLACE) 100 MG capsule Take 100 mg by mouth 2 (two) times daily as needed for mild constipation.    [provider]  Fluticasone-Salmeterol (ADVAIR) 250-50 MCG/DOSE AEPB Inhale 1 puff into the lungs 2 (two) times daily as needed (for seasonal allergy flares).  01/04/16   [provider]  hydrochlorothiazide (HYDRODIURIL) 25 MG tablet Take 25 mg by mouth daily. 11/02/16   [provider]  meloxicam (MOBIC) 15 MG tablet Take 1  tablet (15 mg total) by mouth daily. Take with food each morning Patient not taking: Reported on 11/07/2016 10/11/16   Kandra Nicolas, MD  methocarbamol (ROBAXIN) 750 MG tablet Take 1 tablet (750 mg total) by mouth 4 (four) times daily as needed (use for muscle cramps/pain). 11/07/16   Jackolyn Confer, MD  oxyCODONE (OXY IR/ROXICODONE) 5 MG immediate release tablet Take 5-10 mg by mouth See admin instructions. 5 mg two times a day as needed for pain and 10 mg at bedtime as needed for pain    [provider]  oxyCODONE (OXY IR/ROXICODONE) 5 MG immediate release tablet Take 1-2 tablets (5-10 mg total) by mouth every 4 (four) hours as needed for moderate pain, severe pain or breakthrough pain. 11/07/16   Jackolyn Confer, MD  POTASSIUM PO Take 1 tablet by mouth 2 (two) times daily.    [provider]  sulfamethoxazole-trimethoprim (BACTRIM DS,SEPTRA DS) 800-160 MG tablet Take 1 tablet by mouth 2 (two) times daily for 5 days. 08/06/17 08/11/17  Sherwood Gambler, MD  traZODone (DESYREL) 100 MG tablet Take 100 mg by mouth at bedtime as needed for sleep.  11/02/16   [provider]  vitamin B-12 (CYANOCOBALAMIN) 1000 MCG tablet Take 1,000 mcg by mouth daily.    [provider]    Family History Family History  Problem Relation Age of Onset  . Lupus Mother   . Crohn's disease Father   . Cancer Maternal Grandfather        lung    Social History Social History   Tobacco Use  . Smoking status: Current Every Day Smoker    Packs/day: 0.50    Years: 10.00    Pack years: 5.00    Types: Cigarettes  . Smokeless tobacco: Never Used  Substance Use Topics  . Alcohol use: No  . Drug use: No     Allergies   Penicillins   Review of Systems Review of Systems  Constitutional: Positive for fever (up to 101).  Skin: Negative for wound.       abscess  All other systems reviewed and are negative.    Physical Exam Updated Vital Signs BP (!) 181/105 (BP Location:  Left Arm)   Pulse (!) 101   Temp 99.5 F (37.5 C) (Oral)   Resp 18   Ht 6' (1.829 m)   Wt 92.4 kg (203 lb 11.3 oz)   SpO2 98%   BMI 27.63 kg/m   Physical Exam  Constitutional: She appears well-developed and well-nourished.  HENT:  Head: Normocephalic and atraumatic.  Right Ear: External ear normal.  Left Ear: External ear normal.  Nose: Nose normal.  Eyes: Right eye exhibits no discharge. Left eye exhibits no discharge.  Pulmonary/Chest: Effort normal.  Musculoskeletal:       Back:  Neurological: She is  alert.  Skin: Skin is warm and dry. She is not diaphoretic. No erythema.  Nursing note and vitals reviewed.    ED Treatments / Results  Labs (all labs ordered are listed, but only abnormal results are displayed) Labs Reviewed - No data to display  EKG  EKG Interpretation None       Radiology No results found.  Procedures .Marland KitchenIncision and Drainage Date/Time: 08/06/2017 10:53 PM Performed by: Sherwood Gambler, MD Authorized by: Sherwood Gambler, MD   Consent:    Consent obtained:  Verbal   Consent given by:  Patient Location:    Type:  Abscess   Size:  3.5 cm   Location:  Lower extremity   Lower extremity location:  Buttock   Buttock location:  L buttock Pre-procedure details:    Skin preparation:  Betadine Anesthesia (see MAR for exact dosages):    Anesthesia method:  Local infiltration   Local anesthetic:  Lidocaine 2% WITH epi Procedure type:    Complexity:  Simple Procedure details:    Incision types:  Single straight   Incision depth:  Dermal   Scalpel blade:  11   Wound management:  Probed and deloculated   Drainage:  Purulent   Drainage amount:  Moderate   Wound treatment:  Wound left open   Packing materials:  None Post-procedure details:    Patient tolerance of procedure:  Tolerated well, no immediate complications   (including critical care time)  Medications Ordered in ED Medications  HYDROmorphone (DILAUDID) injection 2 mg (2 mg  Intramuscular Given 08/06/17 2158)  lidocaine-EPINEPHrine (XYLOCAINE W/EPI) 2 %-1:200000 (PF) injection 20 mL (20 mLs Intradermal Given 08/06/17 2158)     Initial Impression / Assessment and Plan / ED Course  I have reviewed the triage vital signs and the nursing notes.  Pertinent labs & imaging results that were available during my care of the patient were reviewed by me and considered in my medical decision making (see chart for details).     Patient presents with recurrent buttock abscess.  This is not perirectal and not in the same location as her previous pilonidal.  It appears to be a simple abscess that is superficial.  She was given IM Dilaudid given her degree of pain and significant apprehension about the procedure.  Incision and drainage was otherwise uncomplicated with moderate purulent discharge.  Given the recurrence and long-standing history of prior abscesses, she will be given short course of Bactrim as well.  She was instructed to follow-up with [facility] given her prior history with them.  Return precautions.  Final Clinical Impressions(s) / ED Diagnoses   Final diagnoses:  Left buttock abscess    ED Discharge Orders        Ordered    sulfamethoxazole-trimethoprim (BACTRIM DS,SEPTRA DS) 800-160 MG tablet  2 times daily     08/06/17 2251       Sherwood Gambler, MD 08/06/17 2255

## 2017-08-06 NOTE — ED Notes (Signed)
Guaze applied to wound. Pt given d/c instructions and wound care instructions. Pt voiced understanding.

## 2018-01-25 ENCOUNTER — Other Ambulatory Visit: Payer: Self-pay

## 2018-01-25 ENCOUNTER — Emergency Department (HOSPITAL_BASED_OUTPATIENT_CLINIC_OR_DEPARTMENT_OTHER)
Admission: EM | Admit: 2018-01-25 | Discharge: 2018-01-25 | Disposition: A | Discharge status: Home / Self Care | Payer: 59 | Attending: Emergency Medicine | Admitting: Emergency Medicine

## 2018-01-25 ENCOUNTER — Encounter (HOSPITAL_BASED_OUTPATIENT_CLINIC_OR_DEPARTMENT_OTHER): Payer: Self-pay | Admitting: *Deleted

## 2018-01-25 ENCOUNTER — Emergency Department (HOSPITAL_BASED_OUTPATIENT_CLINIC_OR_DEPARTMENT_OTHER): Payer: 59

## 2018-01-25 DIAGNOSIS — W1789XA Other fall from one level to another, initial encounter: Secondary | ICD-10-CM | POA: Insufficient documentation

## 2018-01-25 DIAGNOSIS — S8001XA Contusion of right knee, initial encounter: Secondary | ICD-10-CM

## 2018-01-25 DIAGNOSIS — Y999 Unspecified external cause status: Secondary | ICD-10-CM | POA: Insufficient documentation

## 2018-01-25 DIAGNOSIS — Y92008 Other place in unspecified non-institutional (private) residence as the place of occurrence of the external cause: Secondary | ICD-10-CM | POA: Insufficient documentation

## 2018-01-25 DIAGNOSIS — S8991XA Unspecified injury of right lower leg, initial encounter: Secondary | ICD-10-CM | POA: Diagnosis present

## 2018-01-25 DIAGNOSIS — I10 Essential (primary) hypertension: Secondary | ICD-10-CM | POA: Diagnosis not present

## 2018-01-25 DIAGNOSIS — Y9389 Activity, other specified: Secondary | ICD-10-CM | POA: Diagnosis not present

## 2018-01-25 DIAGNOSIS — Z79899 Other long term (current) drug therapy: Secondary | ICD-10-CM | POA: Insufficient documentation

## 2018-01-25 DIAGNOSIS — S8002XA Contusion of left knee, initial encounter: Secondary | ICD-10-CM

## 2018-01-25 DIAGNOSIS — W108XXA Fall (on) (from) other stairs and steps, initial encounter: Secondary | ICD-10-CM

## 2018-01-25 DIAGNOSIS — F17210 Nicotine dependence, cigarettes, uncomplicated: Secondary | ICD-10-CM | POA: Insufficient documentation

## 2018-01-25 MED ORDER — OXYCODONE HCL 5 MG PO TABS: 5.0000 mg | ORAL_TABLET | ORAL | 0 refills | Status: DC | PRN

## 2018-01-25 MED ORDER — MELOXICAM 15 MG PO TABS: 15.0000 mg | ORAL_TABLET | Freq: Every day | ORAL | 0 refills | Status: DC

## 2018-01-25 NOTE — ED Notes (Signed)
Pt sleeping at current time

## 2018-01-25 NOTE — ED Notes (Signed)
Family came out asking how much longer. They seemed verbally upset. RN informed family about the flow in the department and the fact of having one provider. Will continue to monitor

## 2018-01-25 NOTE — ED Triage Notes (Signed)
Pt reports she fell down 5 steps and landed on both knees on concrete friday am when leaving for work. She was seen at [facility] and advised to come here for further eval

## 2018-01-25 NOTE — ED Provider Notes (Signed)
[facility] EMERGENCY DEPARTMENT Provider Note   CSN: [id number] Arrival date & time: 01/25/18  0043     History   Chief Complaint Chief Complaint  Patient presents with  . Fall    HPI [name] is a 42 y.o. female.  The history is provided by the patient.  Fall   She has history of hypertension and patellar instability of the left knee and comes in having injured both of her knees 2 days ago and a fall.  She states that she fell off of her porch with a drop of an estimated 3-4 feet.  She landed directly on the anterior aspects of her knees.  Since then, she complains of pain in both knees.  Pain is rated at 8/10 at rest, but is as high as 10/10 when she tries to walk.  She has history of instability of her left knee and has had surgery to try to fix that and has ongoing problems with pain in that knee.  She has applied ice, but continues to have pain.  She does have an orthopedic physician in [place], and she hopes to see that physician in the next several days.  She denies other injury.  Past Medical History:  Diagnosis Date  . Adnexal cyst 2013   right  . Anemia 2015   "pernicious" and iron deficiency.  as of 2015/2016 no longer requiring po iron or B12 shots.   . 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 arthroscopy x 2.   . Colitis   . Headache(784.0)    migraines  . Hydradenitis 2001   axillary, multiple surgical I & Ds/excisions.  . Hypertension   . Nausea & vomiting 11/28/2014  . Obesity (BMI 30.0-34.9)   . Pilonidal cyst   . Pityriasis rosea    pt also gives hx of seborrheic dermatitis.     Patient Active Problem List   Diagnosis Date Noted  . Infectious colitis 11/21/2015  . Chronic low back pain 11/21/2015  . Chronic asthmatic bronchitis ([place]) 11/21/2015  . Bacterial vaginosis 11/21/2015  . Elevated lipase 12/14/2014  . AP (abdominal pain) 12/14/2014  . Nausea with vomiting 12/14/2014  . Abdominal pain, epigastric   . Nausea and vomiting  11/29/2014  . Intractable abdominal pain 11/28/2014  . Hypokalemia 11/28/2014  . HTN (hypertension) 11/28/2014  . Pilonidal abscess 12/03/2013  . Patellar instability of left knee 06/08/2013    Past Surgical History:  Procedure Laterality Date  . ABDOMINAL HYSTERECTOMY    . CYSTECTOMY     2004 axilla bil  . ESOPHAGOGASTRODUODENOSCOPY N/A 11/30/2014   Procedure: ESOPHAGOGASTRODUODENOSCOPY (EGD);  Surgeon: Jerene Bears, MD;  Location: [facility] ENDOSCOPY;  Service: Endoscopy;  Laterality: N/A;  . FRACTURE SURGERY     rt arm  . GASTRIC BYPASS  2011   gastric sleeve  . KNEE SURGERY  2005   left  . LAPAROSCOPIC CHOLECYSTECTOMY  2002  . MEDIAL PATELLOFEMORAL LIGAMENT REPAIR Left 06/08/2013   Procedure: DIAGNOSTIC OPERATIVE ARTHROSCOPY, OPEN MEDIAL PATELLA FEMORAL LIGAMENT RECONSTRUCTION;  Surgeon: Meredith Pel, MD;  Location: [facility];  Service: Orthopedics;  Laterality: Left;  with Hamstring autograft  . OVARIAN CYST REMOVAL    . PARTIAL HYSTERECTOMY    . R arm surgery       OB History   None      Home Medications    Prior to Admission medications   Medication Sig Start Date End Date Taking? Authorizing Provider  albuterol (PROVENTIL HFA;VENTOLIN HFA) 108 (90  BASE) MCG/ACT inhaler Inhale 2 puffs into the lungs every 6 (six) hours as needed (for chronic bronchitis symptoms).    Yes [provider]  Fluticasone-Salmeterol (ADVAIR) 250-50 MCG/DOSE AEPB Inhale 1 puff into the lungs 2 (two) times daily as needed (for seasonal allergy flares).  01/04/16  Yes [provider]  olmesartan-hydrochlorothiazide (BENICAR HCT) 40-25 MG tablet Take 1 tablet by mouth daily.   Yes [provider]  Aspirin-Acetaminophen-Caffeine (GOODY HEADACHE PO) Take 1 packet by mouth daily as needed (pain).    [provider]  atorvastatin (LIPITOR) 40 MG tablet Take 40 mg by mouth at bedtime. 10/10/16   [provider]  dicyclomine (BENTYL) 10 MG capsule Take 10 mg by  mouth 4 (four) times daily -  before meals and at bedtime.    [provider]  docusate sodium (COLACE) 100 MG capsule Take 100 mg by mouth 2 (two) times daily as needed for mild constipation.    [provider]  hydrochlorothiazide (HYDRODIURIL) 25 MG tablet Take 25 mg by mouth daily. 11/02/16   [provider]  meloxicam (MOBIC) 15 MG tablet Take 1 tablet (15 mg total) by mouth daily. Take with food each morning 06/29/91   Delora Fuel, MD  methocarbamol (ROBAXIN) 750 MG tablet Take 1 tablet (750 mg total) by mouth 4 (four) times daily as needed (use for muscle cramps/pain). 11/07/16   Jackolyn Confer, MD  oxyCODONE (OXY IR/ROXICODONE) 5 MG immediate release tablet Take 1-2 tablets (5-10 mg total) by mouth every 4 (four) hours as needed for moderate pain, severe pain or breakthrough pain. 11/18/67   Delora Fuel, MD  POTASSIUM PO Take 1 tablet by mouth 2 (two) times daily.    [provider]  traZODone (DESYREL) 100 MG tablet Take 100 mg by mouth at bedtime as needed for sleep.  11/02/16   [provider]  vitamin B-12 (CYANOCOBALAMIN) 1000 MCG tablet Take 1,000 mcg by mouth daily.    [provider]    Family History Family History  Problem Relation Age of Onset  . Lupus Mother   . Crohn's disease Father   . Cancer Maternal Grandfather        lung    Social History Social History   Tobacco Use  . Smoking status: Current Every Day Smoker    Packs/day: 0.50    Years: 10.00    Pack years: 5.00    Types: Cigarettes  . Smokeless tobacco: Never Used  Substance Use Topics  . Alcohol use: No  . Drug use: No     Allergies   Penicillins   Review of Systems Review of Systems  All other systems reviewed and are negative.    Physical Exam Updated Vital Signs BP (!) 151/107 (BP Location: Right Arm)   Pulse 86   Temp 98.6 F (37 C) (Oral)   Resp 20   Ht 6' (1.829 m)   Wt 91.6 kg   SpO2 100%   BMI 27.40 kg/m   Physical Exam   Nursing note and vitals reviewed.  42 year old female, resting comfortably and in no acute distress. Vital signs are significant for elevated blood pressure. Oxygen saturation is 100%, which is normal. Head is normocephalic and atraumatic. PERRLA, EOMI. Oropharynx is clear. Neck is nontender and supple without adenopathy or JVD. Back is nontender and there is no CVA tenderness. Lungs are clear without rales, wheezes, or rhonchi. Chest is nontender. Heart has regular rate and rhythm without murmur. Abdomen is  soft, flat, nontender without masses or hepatosplenomegaly and peristalsis is normoactive. Extremities: No effusion of either knee.  There is tenderness to palpation rather diffusely with no clear point tenderness.  There is pain with valgus and varus stress applied to either knee without any laxity.  Lachman test is negative.  Unable to perform McMurray's test due to pain.  Distal neurovascular exam is intact with strong pulses, prompt capillary refill, normal sensation. Skin is warm and dry without rash. Neurologic: Mental status is normal, cranial nerves are intact, there are no motor or sensory deficits.  ED Treatments / Results   Radiology Dg Knee Complete 4 Views Left  Result Date: 01/25/2018 CLINICAL DATA:  Patient fell down 5 steps 2 days ago with bilateral knee pain and decreased range of motion. EXAM: LEFT KNEE - COMPLETE 4+ VIEW COMPARISON:  04/18/2014 and 06/08/2013 radiographs FINDINGS: Annamarie Dawley tracks are noted of the distal femoral metaphysis and patella from prior ligamentous fixation. New small ossific density projects off the medial femoral condyle suspicious an age-indeterminate MCL avulsion injury. Given soft tissue swelling, suspect a recent injury. Mild femorotibial joint space narrowing is seen. No joint dislocation or effusion. IMPRESSION: New small ossific density off the medial femoral condyle possibly representing an MCL avulsion off its proximal femoral attachment.  Degenerative joint space narrowing of the knee without joint dislocation or effusion. Electronically Signed   By: Ashley Royalty M.D.   On: 01/25/2018 01:53   Dg Knee Complete 4 Views Right  Result Date: 01/25/2018 CLINICAL DATA:  [place] down steps 2 days ago. Bilateral knee pain with decreased range of motion. EXAM: RIGHT KNEE - COMPLETE 4+ VIEW COMPARISON:  MRI right knee 02/02/2006 FINDINGS: There is a mildly displaced acute appearing avulsion fragment arising from the medial femoral condyle. No other fractures identified. No significant effusion. No focal bone lesion or bone destruction. Soft tissues are unremarkable. IMPRESSION: Mildly displaced avulsion fragment arising from the medial femoral condyle. Electronically Signed   By: Lucienne Capers M.D.   On: 01/25/2018 01:48    Procedures Procedures   Medications Ordered in ED Medications - No data to display   Initial Impression / Assessment and Plan / ED Course  I have reviewed the triage vital signs and the nursing notes.  Pertinent imaging results that were available during my care of the patient were reviewed by me and considered in my medical decision making (see chart for details).  Fall with bilateral knee pain.  X-rays were obtained which appear to show avulsions off of the MCL bilaterally.  She does not have point tenderness over those areas, nor does she have ligamentous laxity, so I do not feel that this is an acute MCL avulsion.  She is advised on ice and elevation and given prescriptions for meloxicam and oxycodone.  She is referred back to her orthopedic physician for further outpatient work-up.  Final Clinical Impressions(s) / ED Diagnoses   Final diagnoses:  Accidental fall on or from stairs or steps, initial encounter  Contusion of right knee, initial encounter  Contusion of left knee, initial encounter    ED Discharge Orders         Ordered    meloxicam (MOBIC) 15 MG tablet  Daily     01/25/18 0458    oxyCODONE (OXY  IR/ROXICODONE) 5 MG immediate release tablet  Every 4 hours PRN     01/25/18 4098           Delora Fuel, MD 11/91[birth year]47 [phone]

## 2018-01-25 NOTE — ED Notes (Signed)
Pt understood dc material. NAD noted. Scripts and work excuse given at dc 

## 2018-01-25 NOTE — Discharge Instructions (Addendum)
Continue to apply  ice as needed.  Take acetaminophen as needed for less severe pain, or to get additional pain relief from meloxicam or oxycodone.

## 2018-02-02 DIAGNOSIS — K580 Irritable bowel syndrome with diarrhea: Secondary | ICD-10-CM | POA: Insufficient documentation

## 2018-02-02 DIAGNOSIS — F411 Generalized anxiety disorder: Secondary | ICD-10-CM | POA: Insufficient documentation

## 2018-03-30 ENCOUNTER — Emergency Department (HOSPITAL_COMMUNITY)
Admission: EM | Admit: 2018-03-30 | Discharge: 2018-03-30 | Disposition: A | Discharge status: Home / Self Care | Payer: 59 | Attending: Emergency Medicine | Admitting: Emergency Medicine

## 2018-03-30 ENCOUNTER — Emergency Department (HOSPITAL_COMMUNITY): Payer: 59

## 2018-03-30 ENCOUNTER — Encounter (HOSPITAL_COMMUNITY): Payer: Self-pay | Admitting: Emergency Medicine

## 2018-03-30 ENCOUNTER — Other Ambulatory Visit: Payer: Self-pay

## 2018-03-30 DIAGNOSIS — I10 Essential (primary) hypertension: Secondary | ICD-10-CM | POA: Insufficient documentation

## 2018-03-30 DIAGNOSIS — J069 Acute upper respiratory infection, unspecified: Secondary | ICD-10-CM | POA: Insufficient documentation

## 2018-03-30 DIAGNOSIS — Z7982 Long term (current) use of aspirin: Secondary | ICD-10-CM | POA: Insufficient documentation

## 2018-03-30 DIAGNOSIS — R945 Abnormal results of liver function studies: Secondary | ICD-10-CM | POA: Insufficient documentation

## 2018-03-30 DIAGNOSIS — Z79899 Other long term (current) drug therapy: Secondary | ICD-10-CM | POA: Diagnosis not present

## 2018-03-30 DIAGNOSIS — R05 Cough: Secondary | ICD-10-CM | POA: Diagnosis present

## 2018-03-30 DIAGNOSIS — R7989 Other specified abnormal findings of blood chemistry: Secondary | ICD-10-CM

## 2018-03-30 DIAGNOSIS — F17210 Nicotine dependence, cigarettes, uncomplicated: Secondary | ICD-10-CM | POA: Diagnosis not present

## 2018-03-30 DIAGNOSIS — R748 Abnormal levels of other serum enzymes: Secondary | ICD-10-CM | POA: Diagnosis not present

## 2018-03-30 DIAGNOSIS — E876 Hypokalemia: Secondary | ICD-10-CM | POA: Insufficient documentation

## 2018-03-30 LAB — CBC WITH DIFFERENTIAL/PLATELET
ABS IMMATURE GRANULOCYTES: 0.01 10*3/uL (ref 0.00–0.07)
BASOS PCT: 0 %
Basophils Absolute: 0 10*3/uL (ref 0.0–0.1)
Eosinophils Absolute: 0.1 10*3/uL (ref 0.0–0.5)
Eosinophils Relative: 2 %
HCT: 39.1 % (ref 36.0–46.0)
HEMOGLOBIN: 13.3 g/dL (ref 12.0–15.0)
IMMATURE GRANULOCYTES: 0 %
LYMPHS PCT: 39 %
Lymphs Abs: 2.3 10*3/uL (ref 0.7–4.0)
MCH: 36.2 pg — AB (ref 26.0–34.0)
MCHC: 34 g/dL (ref 30.0–36.0)
MCV: 106.5 fL — ABNORMAL HIGH (ref 80.0–100.0)
MONO ABS: 0.4 10*3/uL (ref 0.1–1.0)
MONOS PCT: 7 %
NEUTROS ABS: 3.1 10*3/uL (ref 1.7–7.7)
NEUTROS PCT: 52 %
PLATELETS: 239 10*3/uL (ref 150–400)
RBC: 3.67 MIL/uL — ABNORMAL LOW (ref 3.87–5.11)
RDW: 12.9 % (ref 11.5–15.5)
WBC: 5.8 10*3/uL (ref 4.0–10.5)
nRBC: 0 % (ref 0.0–0.2)

## 2018-03-30 LAB — COMPREHENSIVE METABOLIC PANEL
ALBUMIN: 2.4 g/dL — AB (ref 3.5–5.0)
ALT: 52 U/L — ABNORMAL HIGH (ref 0–44)
AST: 103 U/L — AB (ref 15–41)
Alkaline Phosphatase: 130 U/L — ABNORMAL HIGH (ref 38–126)
Anion gap: 11 (ref 5–15)
BUN: <5 mg/dL — ABNORMAL LOW (ref 6–20)
CHLORIDE: 105 mmol/L (ref 98–111)
CO2: 24 mmol/L (ref 22–32)
Calcium: 7.8 mg/dL — ABNORMAL LOW (ref 8.9–10.3)
Creatinine, Ser: 0.7 mg/dL (ref 0.44–1.00)
GFR calc Af Amer: >60 mL/min (ref >60)
GFR calc non Af Amer: >60 mL/min (ref >60)
GLUCOSE: 98 mg/dL (ref 70–99)
POTASSIUM: 3 mmol/L — AB (ref 3.5–5.1)
SODIUM: 140 mmol/L (ref 135–145)
Total Bilirubin: 1.2 mg/dL (ref 0.3–1.2)
Total Protein: 5.2 g/dL — ABNORMAL LOW (ref 6.5–8.1)

## 2018-03-30 LAB — I-STAT TROPONIN, ED: TROPONIN I, POC: 0 ng/mL (ref 0.00–0.08)

## 2018-03-30 LAB — LIPASE, BLOOD: Lipase: 71 U/L — ABNORMAL HIGH (ref 11–51)

## 2018-03-30 MED ORDER — POTASSIUM CHLORIDE ER 10 MEQ PO TBCR: 10.0000 meq | EXTENDED_RELEASE_TABLET | Freq: Every day | ORAL | 0 refills | Status: DC

## 2018-03-30 MED ORDER — IPRATROPIUM-ALBUTEROL 0.5-2.5 (3) MG/3ML IN SOLN
3.0000 mL | Freq: Once | RESPIRATORY_TRACT | Status: AC
  Administered 2018-03-30: 3 mL via RESPIRATORY_TRACT
  Filled 2018-03-30: qty 3

## 2018-03-30 MED ORDER — IOPAMIDOL (ISOVUE-370) INJECTION 76%
100.0000 mL | Freq: Once | INTRAVENOUS | Status: AC | PRN
  Administered 2018-03-30: 100 mL via INTRAVENOUS

## 2018-03-30 MED ORDER — IOPAMIDOL (ISOVUE-370) INJECTION 76%
INTRAVENOUS | Status: AC
  Filled 2018-03-30: qty 100

## 2018-03-30 MED ORDER — ALBUTEROL SULFATE HFA 108 (90 BASE) MCG/ACT IN AERS: 1.0000 | INHALATION_SPRAY | Freq: Four times a day (QID) | RESPIRATORY_TRACT | 0 refills | Status: DC | PRN

## 2018-03-30 MED ORDER — POTASSIUM CHLORIDE CRYS ER 20 MEQ PO TBCR
40.0000 meq | EXTENDED_RELEASE_TABLET | Freq: Once | ORAL | Status: AC
  Administered 2018-03-30: 40 meq via ORAL
  Filled 2018-03-30: qty 2

## 2018-03-30 MED ORDER — SODIUM CHLORIDE 0.9 % IV BOLUS
500.0000 mL | Freq: Once | INTRAVENOUS | Status: AC
  Administered 2018-03-30: 500 mL via INTRAVENOUS

## 2018-03-30 MED ORDER — ACETAMINOPHEN 500 MG PO TABS
1000.0000 mg | ORAL_TABLET | Freq: Once | ORAL | Status: AC
  Administered 2018-03-30: 1000 mg via ORAL
  Filled 2018-03-30: qty 2

## 2018-03-30 NOTE — ED Notes (Addendum)
Received a call from CT and they advised they infiltrated the IV. CT advised they did not see anything suitable for them to start an IV in. Asked for this RN to find a vein.

## 2018-03-30 NOTE — Discharge Instructions (Addendum)
You need to follow up with the gastroenterology doctor to as you will likely need a colonoscopy and will need to repeat your laboratory work.  Please return to the emergency department for any new or worsening symptoms.

## 2018-03-30 NOTE — ED Notes (Addendum)
Patient verbalizes understanding of discharge instructions. Opportunity for questioning and answers were provided. Armband removed by staff, pt discharged from ED home via POV.  

## 2018-03-30 NOTE — ED Triage Notes (Signed)
Pt reports cough and body aches for 1 week. Pt reports doing a video visit through her insurance company who in turn prescribed her Tessalon Pearls for the cough. Pt states they have not helped.

## 2018-03-30 NOTE — ED Provider Notes (Signed)
[facility]PART[facility] Provider Note   CSN: [id number] Arrival date & time: 03/30/18  0024     History   Chief Complaint Chief Complaint  Patient presents with  . Cough  . Generalized Body Aches    HPI [name] is a 42 y.o. female.  HPI   Pt is a 42 y/o female with a h/o HTN, asthma, infectious colitis, hypokalemia, elevated lipase, gastric bypass, cholecystectomy, hysterectomy, who presents to the ED today c/o a cough that began 1 week ago. She states that cough is dry. Also reports fevers, chills, nasal congestion, diarrhea, nausea, and shortness of breath. She also reports midsternal chest pain that has been present for months that she attributes to frequent bronchitis and coughing. Pain does not radiate and there is no pain with inspiration. Not associated with exertion, diaphoresis, palpitations.  She denies sore throat, rhinorrhea, ear pain, vomiting, dysuria, frequency. Has some abd pain that is intermittent and resolves with BMs.  Of note, pt reports recent h/o 40 pound unintended weight loss over the last several months. Has also had night sweats and generalized fatigue. States she woke up this morning and felt like her eyes were yellow.   Denies current leg pain/swelling, hemoptysis, recent surgery/trauma, recent long travel, hormone use, personal hx of cancer, or hx of DVT/PE.   States she contacted a tele medicine provider and was given tessalon perrles with no relief.  Past Medical History:  Diagnosis Date  . Adnexal cyst 2013   right  . Anemia 2015   "pernicious" and iron deficiency.  as of 2015/2016 no longer requiring po iron or B12 shots.   . 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 arthroscopy x 2.   . Colitis   . Headache(784.0)    migraines  . Hydradenitis 2001   axillary, multiple surgical I & Ds/excisions.  . Hypertension   . Nausea & vomiting 11/28/2014  . Obesity (BMI 30.0-34.9)   . Pilonidal cyst   . Pityriasis  rosea    pt also gives hx of seborrheic dermatitis.     Patient Active Problem List   Diagnosis Date Noted  . Infectious colitis 11/21/2015  . Chronic low back pain 11/21/2015  . Chronic asthmatic bronchitis ([place]) 11/21/2015  . Bacterial vaginosis 11/21/2015  . Elevated lipase 12/14/2014  . AP (abdominal pain) 12/14/2014  . Nausea with vomiting 12/14/2014  . Abdominal pain, epigastric   . Nausea and vomiting 11/29/2014  . Intractable abdominal pain 11/28/2014  . Hypokalemia 11/28/2014  . HTN (hypertension) 11/28/2014  . Pilonidal abscess 12/03/2013  . Patellar instability of left knee 06/08/2013    Past Surgical History:  Procedure Laterality Date  . ABDOMINAL HYSTERECTOMY    . CYSTECTOMY     2004 axilla bil  . ESOPHAGOGASTRODUODENOSCOPY N/A 11/30/2014   Procedure: ESOPHAGOGASTRODUODENOSCOPY (EGD);  Surgeon: Jerene Bears, MD;  Location: [facility] ENDOSCOPY;  Service: Endoscopy;  Laterality: N/A;  . FRACTURE SURGERY     rt arm  . GASTRIC BYPASS  2011   gastric sleeve  . KNEE SURGERY  2005   left  . LAPAROSCOPIC CHOLECYSTECTOMY  2002  . MEDIAL PATELLOFEMORAL LIGAMENT REPAIR Left 06/08/2013   Procedure: DIAGNOSTIC OPERATIVE ARTHROSCOPY, OPEN MEDIAL PATELLA FEMORAL LIGAMENT RECONSTRUCTION;  Surgeon: Meredith Pel, MD;  Location: [facility];  Service: Orthopedics;  Laterality: Left;  with Hamstring autograft  . OVARIAN CYST REMOVAL    . PARTIAL HYSTERECTOMY    . R arm surgery  OB History   None      Home Medications    Prior to Admission medications   Medication Sig Start Date End Date Taking? Authorizing Provider  albuterol (PROVENTIL HFA;VENTOLIN HFA) 108 (90 Base) MCG/ACT inhaler Inhale 1-2 puffs into the lungs every 6 (six) hours as needed for wheezing or shortness of breath. 03/30/18   Shelene Krage S, PA-C  Aspirin-Acetaminophen-Caffeine (GOODY HEADACHE PO) Take 1 packet by mouth daily as needed (pain).    [provider]  atorvastatin (LIPITOR) 40 MG  tablet Take 40 mg by mouth at bedtime. 10/10/16   [provider]  dicyclomine (BENTYL) 10 MG capsule Take 10 mg by mouth 4 (four) times daily -  before meals and at bedtime.    [provider]  docusate sodium (COLACE) 100 MG capsule Take 100 mg by mouth 2 (two) times daily as needed for mild constipation.    [provider]  Fluticasone-Salmeterol (ADVAIR) 250-50 MCG/DOSE AEPB Inhale 1 puff into the lungs 2 (two) times daily as needed (for seasonal allergy flares).  01/04/16   [provider]  hydrochlorothiazide (HYDRODIURIL) 25 MG tablet Take 25 mg by mouth daily. 11/02/16   [provider]  meloxicam (MOBIC) 15 MG tablet Take 1 tablet (15 mg total) by mouth daily. Take with food each morning 11/24/91   Delora Fuel, MD  methocarbamol (ROBAXIN) 750 MG tablet Take 1 tablet (750 mg total) by mouth 4 (four) times daily as needed (use for muscle cramps/pain). 11/07/16   Jackolyn Confer, MD  olmesartan-hydrochlorothiazide (BENICAR HCT) 40-25 MG tablet Take 1 tablet by mouth daily.    [provider]  oxyCODONE (OXY IR/ROXICODONE) 5 MG immediate release tablet Take 1-2 tablets (5-10 mg total) by mouth every 4 (four) hours as needed for moderate pain, severe pain or breakthrough pain. 9/0/30   Delora Fuel, MD  potassium chloride (K-DUR) 10 MEQ tablet Take 1 tablet (10 mEq total) by mouth daily for 7 days. 03/30/18 04/06/18  Dawood Spitler S, PA-C  POTASSIUM PO Take 1 tablet by mouth 2 (two) times daily.    [provider]  traZODone (DESYREL) 100 MG tablet Take 100 mg by mouth at bedtime as needed for sleep.  11/02/16   [provider]  vitamin B-12 (CYANOCOBALAMIN) 1000 MCG tablet Take 1,000 mcg by mouth daily.    [provider]    Family History Family History  Problem Relation Age of Onset  . Lupus Mother   . Crohn's disease Father   . Cancer Maternal Grandfather        lung    Social History Social History    Tobacco Use  . Smoking status: Current Every Day Smoker    Packs/day: 0.50    Years: 10.00    Pack years: 5.00    Types: Cigarettes  . Smokeless tobacco: Never Used  Substance Use Topics  . Alcohol use: No  . Drug use: No     Allergies   Penicillins   Review of Systems Review of Systems  Constitutional: Positive for chills, fever and unexpected weight change.  HENT: Positive for congestion. Negative for ear pain, rhinorrhea and sore throat.   Eyes: Negative for visual disturbance.  Respiratory: Positive for cough and shortness of breath.   Cardiovascular: Positive for chest pain. Negative for leg swelling.  Gastrointestinal: Positive for abdominal pain, diarrhea and nausea. Negative for blood in stool, constipation and vomiting.  Genitourinary: Negative for dysuria, frequency and urgency.  Musculoskeletal: Positive for myalgias.  Skin: Negative for rash.  Neurological: Positive for headaches (chronic ).     Physical Exam Updated Vital Signs BP (!) 160/98   Pulse 81   Temp 99.2 F (37.3 C) (Oral)   Resp 20   Ht 6' (1.829 m)   Wt 81.6 kg   LMP  (Exact Date)   SpO2 100%   BMI 24.41 kg/m   Physical Exam  Constitutional: She appears well-developed and well-nourished. No distress.  HENT:  Head: Normocephalic and atraumatic.  bilat tm without erythema or effusion. No pharyngeal erythema, tonsillar swelling or exudates  Eyes: Pupils are equal, round, and reactive to light. Conjunctivae and EOM are normal. No scleral icterus.  Neck: Neck supple.  Cardiovascular: Normal rate, regular rhythm and normal heart sounds.  No murmur heard. Pulmonary/Chest: Effort normal and breath sounds normal. She has no wheezes.  Expiratory wheezing bilaterally, cough on exam  Abdominal: Soft. Bowel sounds are normal. There is tenderness (RUQ, epigastric).  Voluntary guarding to RUQ, question hepatomegaly on exam, no rigidity or rebound ttp  Musculoskeletal:  No calf TTP, erythema,  swelling.  Neurological: She is alert.  Skin: Skin is warm and dry.  Psychiatric: She has a normal mood and affect.  Nursing note and vitals reviewed.  ED Treatments / Results  Labs (all labs ordered are listed, but only abnormal results are displayed) Labs Reviewed  COMPREHENSIVE METABOLIC PANEL - Abnormal; Notable for the following components:      Result Value   Potassium 3.0 (*)    BUN <5 (*)    Calcium 7.8 (*)    Total Protein 5.2 (*)    Albumin 2.4 (*)    AST 103 (*)    ALT 52 (*)    Alkaline Phosphatase 130 (*)    All other components within normal limits  CBC WITH DIFFERENTIAL/PLATELET - Abnormal; Notable for the following components:   RBC 3.67 (*)    MCV 106.5 (*)    MCH 36.2 (*)    All other components within normal limits  LIPASE, BLOOD - Abnormal; Notable for the following components:   Lipase 71 (*)    All other components within normal limits  I-STAT TROPONIN, ED    EKG None  Radiology Dg Chest 2 View  Result Date: 03/30/2018 CLINICAL DATA:  Initial evaluation for acute cough. EXAM: CHEST - 2 VIEW COMPARISON:  Prior radiograph from 08/29/2016. FINDINGS: The cardiac and mediastinal silhouettes are stable in size and contour, and remain within normal limits. The lungs are normally inflated. No airspace consolidation, pleural effusion, or pulmonary edema is identified. There is no pneumothorax. No acute osseous abnormality identified. IMPRESSION: No active cardiopulmonary disease. Electronically Signed   By: Jeannine Boga M.D.   On: 03/30/2018 01:24   Ct Angio Chest Pe W And/or Wo Contrast  Result Date: 03/30/2018 CLINICAL DATA:  Acute onset of cough and body aches. EXAM: CT ANGIOGRAPHY CHEST CT ABDOMEN AND PELVIS WITH CONTRAST TECHNIQUE: Multidetector CT imaging of the chest was performed using the standard protocol during bolus administration of intravenous contrast. Multiplanar CT image reconstructions and MIPs were obtained to evaluate the vascular  anatomy. Multidetector CT imaging of the abdomen and pelvis was performed using the standard protocol during bolus administration of intravenous contrast. CONTRAST:  179m ISOVUE-370 IOPAMIDOL (ISOVUE-370) INJECTION 76% COMPARISON:  CT of the abdomen and pelvis performed 08/28/2016, and chest radiograph performed earlier today at 1:16 a.m. FINDINGS: CTA CHEST FINDINGS Cardiovascular:  There is no evidence of pulmonary embolus. The heart is  normal in size. The thoracic aorta is grossly unremarkable. The great vessels are within normal limits. Mediastinum/Nodes: The mediastinum is unremarkable in appearance. No mediastinal lymphadenopathy is seen. No pericardial effusion is identified. The visualized portions of the thyroid gland are unremarkable. No axillary lymphadenopathy is seen. Lungs/Pleura: The lungs are clear bilaterally. No focal consolidation, pleural effusion or pneumothorax is seen. No masses are identified. Musculoskeletal: No acute osseous abnormalities are identified. The visualized musculature is unremarkable in appearance. Review of the MIP images confirms the above findings. CT ABDOMEN and PELVIS FINDINGS Hepatobiliary: The liver is unremarkable in appearance, with mild fatty infiltration about the falciform ligament. The patient is status post cholecystectomy, with clips noted at the gallbladder fossa. The common bile duct remains normal in caliber. Pancreas: The pancreas is within normal limits. Spleen: The spleen is unremarkable in appearance. Adrenals/Urinary Tract: The adrenal glands are unremarkable in appearance. The kidneys are within normal limits. There is no evidence of hydronephrosis. No renal or ureteral stones are identified. No perinephric stranding is seen. Stomach/Bowel: The appendix is normal in caliber. There is mild mucosal thickening along much of the colon, with underlying fatty infiltration along the wall of the ascending colon, concerning for mild chronic inflammation. The  small bowel is largely decompressed and grossly unremarkable. The patient is status post sleeve gastrectomy. The remaining stomach is grossly unremarkable. Vascular/Lymphatic: The abdominal aorta is unremarkable in appearance. The inferior vena cava is grossly unremarkable. No retroperitoneal lymphadenopathy is seen. No pelvic sidewall lymphadenopathy is identified. Reproductive: The bladder is mildly distended and grossly unremarkable. The patient is status post hysterectomy. The ovaries are relatively symmetric. No suspicious adnexal masses are seen. Other: No additional soft tissue abnormalities are seen. Musculoskeletal: No acute osseous abnormalities are identified. The visualized musculature is unremarkable in appearance. Review of the MIP images confirms the above findings. IMPRESSION: 1. No evidence of pulmonary embolus. 2. Lungs clear bilaterally. 3. Mild mucosal thickening along much of the colon, with underlying fatty infiltration along the wall of the ascending colon, concerning for mild chronic inflammation. Electronically Signed   By: Garald Balding M.D.   On: 03/30/2018 03:59   Ct Abdomen Pelvis W Contrast  Result Date: 03/30/2018 CLINICAL DATA:  Acute onset of cough and body aches. EXAM: CT ANGIOGRAPHY CHEST CT ABDOMEN AND PELVIS WITH CONTRAST TECHNIQUE: Multidetector CT imaging of the chest was performed using the standard protocol during bolus administration of intravenous contrast. Multiplanar CT image reconstructions and MIPs were obtained to evaluate the vascular anatomy. Multidetector CT imaging of the abdomen and pelvis was performed using the standard protocol during bolus administration of intravenous contrast. CONTRAST:  120m ISOVUE-370 IOPAMIDOL (ISOVUE-370) INJECTION 76% COMPARISON:  CT of the abdomen and pelvis performed 08/28/2016, and chest radiograph performed earlier today at 1:16 a.m. FINDINGS: CTA CHEST FINDINGS Cardiovascular:  There is no evidence of pulmonary embolus. The  heart is normal in size. The thoracic aorta is grossly unremarkable. The great vessels are within normal limits. Mediastinum/Nodes: The mediastinum is unremarkable in appearance. No mediastinal lymphadenopathy is seen. No pericardial effusion is identified. The visualized portions of the thyroid gland are unremarkable. No axillary lymphadenopathy is seen. Lungs/Pleura: The lungs are clear bilaterally. No focal consolidation, pleural effusion or pneumothorax is seen. No masses are identified. Musculoskeletal: No acute osseous abnormalities are identified. The visualized musculature is unremarkable in appearance. Review of the MIP images confirms the above findings. CT ABDOMEN and PELVIS FINDINGS Hepatobiliary: The liver is unremarkable in appearance, with mild fatty infiltration about the  falciform ligament. The patient is status post cholecystectomy, with clips noted at the gallbladder fossa. The common bile duct remains normal in caliber. Pancreas: The pancreas is within normal limits. Spleen: The spleen is unremarkable in appearance. Adrenals/Urinary Tract: The adrenal glands are unremarkable in appearance. The kidneys are within normal limits. There is no evidence of hydronephrosis. No renal or ureteral stones are identified. No perinephric stranding is seen. Stomach/Bowel: The appendix is normal in caliber. There is mild mucosal thickening along much of the colon, with underlying fatty infiltration along the wall of the ascending colon, concerning for mild chronic inflammation. The small bowel is largely decompressed and grossly unremarkable. The patient is status post sleeve gastrectomy. The remaining stomach is grossly unremarkable. Vascular/Lymphatic: The abdominal aorta is unremarkable in appearance. The inferior vena cava is grossly unremarkable. No retroperitoneal lymphadenopathy is seen. No pelvic sidewall lymphadenopathy is identified. Reproductive: The bladder is mildly distended and grossly  unremarkable. The patient is status post hysterectomy. The ovaries are relatively symmetric. No suspicious adnexal masses are seen. Other: No additional soft tissue abnormalities are seen. Musculoskeletal: No acute osseous abnormalities are identified. The visualized musculature is unremarkable in appearance. Review of the MIP images confirms the above findings. IMPRESSION: 1. No evidence of pulmonary embolus. 2. Lungs clear bilaterally. 3. Mild mucosal thickening along much of the colon, with underlying fatty infiltration along the wall of the ascending colon, concerning for mild chronic inflammation. Electronically Signed   By: Garald Balding M.D.   On: 03/30/2018 03:59    Procedures Procedures (including critical care time)  Medications Ordered in ED Medications  ipratropium-albuterol (DUONEB) 0.5-2.5 (3) MG/3ML nebulizer solution 3 mL (3 mLs Nebulization Given 03/30/18 0102)  acetaminophen (TYLENOL) tablet 1,000 mg (1,000 mg Oral Given 03/30/18 0155)  potassium chloride SA (K-DUR,KLOR-CON) CR tablet 40 mEq (40 mEq Oral Given 03/30/18 0155)  sodium chloride 0.9 % bolus 500 mL (0 mLs Intravenous Stopped 03/30/18 0326)  iopamidol (ISOVUE-370) 76 % injection 100 mL (100 mLs Intravenous Contrast Given 03/30/18 0305)     Initial Impression / Assessment and Plan / ED Course  I have reviewed the triage vital signs and the nursing notes.  Pertinent labs & imaging results that were available during my care of the patient were reviewed by me and considered in my medical decision making (see chart for details).     Final Clinical Impressions(s) / ED Diagnoses   Final diagnoses:  Upper respiratory tract infection, unspecified type  Elevated LFTs  Elevated alkaline phosphatase level  Hypokalemia   Pt presenting with URI sxs for the last week. Also with unintended weight loss, night sweats, and self reports of "yellow eyes". Initially borderline febrile with HR 102. BP slightly elevated, satting  well on RA with normal respiratory rate.  Lungs with mild wheezing bilaterally. Cardiac exam benign. abd with RUQ and epigastric TTP. No rigidity or rebound ttp.   CBC without leukocytosis or anemia. CMP with hypokalemia at 3.0, low Ca, total protein, and albumin. Also with elevated AST/ALT at 103 and 52 respectively. Alk phos elevated at 130. Question whether elevated liver enzymes could be secondary to ETOH use however pt denies h/o heavy or daily use. Lipase is also slightly elevated.   Trop is negative. EKG with NSR, no ischemic changes. CXR negative. sxs do not seem consistent with ACS and heart score is 2 based on risk factors.  CT abd pelvis obtained due to RUQ pain, elevated LFTs and alk phos, and concern for possible underlying malignancy.  CT pe protocol ordered to r/o PE  As well given report of sob, chronic midsternal chest pain, cough, and suspicion for malignancy.   Ct abd/pelvis with mucosal thickening along much of the colon, with underlying fatty infiltration along the wall of the ascending colon, concerning for mild chronic inflammation. Ct chest without evidence of pulmonary embolus.  With regard to cough and congestion sxs, pt has no evidence of pneumonia, suspect viral URI. Will tx symptomatically and have her f/u if worse.  With regard to abd pain, abnormal labs, and CT findings, will have pt f/u with her gastroenterologist and recommended that she will likely need colonoscopy and repeat labs. Advised to refrain from ETOH use.  I gave referral for GI in case pt is unable to get an appt with her own physician. Will give rx for kdur to supplement her potassium.  Advised pt to monitor sxs and to return to the ED if new or worsening sxs develop. She voiced understanding of the plan and reasons to return. All questions answered.     ED Discharge Orders         Ordered    albuterol (PROVENTIL HFA;VENTOLIN HFA) 108 (90 Base) MCG/ACT inhaler  Every 6 hours PRN     03/30/18 0415      potassium chloride (K-DUR) 10 MEQ tablet  Daily     03/30/18 0415           Rodney Booze, PA-C 03/31/18 [place], Wenda Overland, MD 03/31/18 [phone]

## 2018-08-31 ENCOUNTER — Encounter (HOSPITAL_BASED_OUTPATIENT_CLINIC_OR_DEPARTMENT_OTHER): Payer: Self-pay

## 2018-08-31 ENCOUNTER — Other Ambulatory Visit: Payer: Self-pay

## 2018-08-31 ENCOUNTER — Emergency Department (HOSPITAL_BASED_OUTPATIENT_CLINIC_OR_DEPARTMENT_OTHER)
Admission: EM | Admit: 2018-08-31 | Discharge: 2018-08-31 | Disposition: A | Discharge status: Home / Self Care | Payer: 59 | Attending: Emergency Medicine | Admitting: Emergency Medicine

## 2018-08-31 ENCOUNTER — Emergency Department (HOSPITAL_BASED_OUTPATIENT_CLINIC_OR_DEPARTMENT_OTHER): Payer: 59

## 2018-08-31 DIAGNOSIS — I10 Essential (primary) hypertension: Secondary | ICD-10-CM | POA: Diagnosis not present

## 2018-08-31 DIAGNOSIS — J189 Pneumonia, unspecified organism: Secondary | ICD-10-CM | POA: Diagnosis not present

## 2018-08-31 DIAGNOSIS — F17210 Nicotine dependence, cigarettes, uncomplicated: Secondary | ICD-10-CM | POA: Diagnosis not present

## 2018-08-31 DIAGNOSIS — R05 Cough: Secondary | ICD-10-CM | POA: Diagnosis present

## 2018-08-31 DIAGNOSIS — Z79899 Other long term (current) drug therapy: Secondary | ICD-10-CM | POA: Insufficient documentation

## 2018-08-31 MED ORDER — ALBUTEROL SULFATE HFA 108 (90 BASE) MCG/ACT IN AERS
2.0000 | INHALATION_SPRAY | Freq: Once | RESPIRATORY_TRACT | Status: AC
  Administered 2018-08-31: 2 via RESPIRATORY_TRACT
  Filled 2018-08-31: qty 6.7

## 2018-08-31 MED ORDER — GUAIFENESIN-CODEINE 100-10 MG/5ML PO SYRP: 5.0000 mL | ORAL_SOLUTION | Freq: Three times a day (TID) | ORAL | 0 refills | Status: DC | PRN

## 2018-08-31 MED ORDER — DOXYCYCLINE HYCLATE 100 MG PO CAPS: 100.0000 mg | ORAL_CAPSULE | Freq: Two times a day (BID) | ORAL | 0 refills | Status: AC

## 2018-08-31 NOTE — Discharge Instructions (Signed)
Evaluated today for cough.  Chest x-ray did not show evidence of pneumonia, however your lungs do sound like he has pneumonia.  I have written you prescription for antibiotics.  Please use as prescribed.  Also given you a cough syrup.  Please continue to use the albuterol inhaler you are given in the department.  You may use 2 puffs every 4 hours as needed.

## 2018-08-31 NOTE — ED Provider Notes (Signed)
[facility] EMERGENCY DEPARTMENT Provider Note   CSN: [id number] Arrival date & time: 08/31/18  1453  History   Chief Complaint Chief Complaint  Patient presents with  . Cough   HPI [name] is a 43 y.o. female with past medical history significant for hypertension, chronic low back pain, chronic asthmatic bronchitis who presents for evaluation of cough.  Patient states she is had a cough x9 days.  Patient states last week she did have a fever, temp max 101.0 at home, however she has not had a fever in 6 days. Patient states her cough has persisted. States last week she did do a telemedicine visit and they told her to take Zyrtec and Mucinex.  Patient states she has been taking these, however the Mucinex makes her "jittery."  Patient states she stopped taking this 3 days ago due to that symptom.  Patient states she is coughing up light green and yellow sputum.  States she does feel "rundown."  Denies any recent travel or recent exposure to coronavirus positive patients.  Patient states she has had congestion as well as rhinorrhea.  Rhonnorhea clear in nature.  She denies headache, facial asymmetry, neck pain, neck stiffness, sore throat, chest pain, shortness of breath, abdominal pain, diarrhea or dysuria.  Denies any additional aggravating or alleviating factors.  Has not take anything for sx PTA.  History obtained from patient. No interpretor was used.     HPI  Past Medical History:  Diagnosis Date  . Adnexal cyst 2013   right  . Anemia 2015   "pernicious" and iron deficiency.  as of 2015/2016 no longer requiring po iron or B12 shots.   . 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 arthroscopy x 2.   . Colitis   . Headache(784.0)    migraines  . Hydradenitis 2001   axillary, multiple surgical I & Ds/excisions.  . Hypertension   . Nausea & vomiting 11/28/2014  . Obesity (BMI 30.0-34.9)   . Pilonidal cyst   . Pityriasis rosea    pt also gives hx of seborrheic dermatitis.      Patient Active Problem List   Diagnosis Date Noted  . Infectious colitis 11/21/2015  . Chronic low back pain 11/21/2015  . Chronic asthmatic bronchitis ([place]) 11/21/2015  . Bacterial vaginosis 11/21/2015  . Elevated lipase 12/14/2014  . AP (abdominal pain) 12/14/2014  . Nausea with vomiting 12/14/2014  . Abdominal pain, epigastric   . Nausea and vomiting 11/29/2014  . Intractable abdominal pain 11/28/2014  . Hypokalemia 11/28/2014  . HTN (hypertension) 11/28/2014  . Pilonidal abscess 12/03/2013  . Patellar instability of left knee 06/08/2013    Past Surgical History:  Procedure Laterality Date  . ABDOMINAL HYSTERECTOMY    . CYSTECTOMY     2004 axilla bil  . ESOPHAGOGASTRODUODENOSCOPY N/A 11/30/2014   Procedure: ESOPHAGOGASTRODUODENOSCOPY (EGD);  Surgeon: Jerene Bears, MD;  Location: [facility] ENDOSCOPY;  Service: Endoscopy;  Laterality: N/A;  . FRACTURE SURGERY     rt arm  . GASTRIC BYPASS  2011   gastric sleeve  . KNEE SURGERY  2005   left  . LAPAROSCOPIC CHOLECYSTECTOMY  2002  . MEDIAL PATELLOFEMORAL LIGAMENT REPAIR Left 06/08/2013   Procedure: DIAGNOSTIC OPERATIVE ARTHROSCOPY, OPEN MEDIAL PATELLA FEMORAL LIGAMENT RECONSTRUCTION;  Surgeon: Meredith Pel, MD;  Location: [facility];  Service: Orthopedics;  Laterality: Left;  with Hamstring autograft  . OVARIAN CYST REMOVAL    . PARTIAL HYSTERECTOMY    . R arm surgery  OB History   No obstetric history on file.      Home Medications    Prior to Admission medications   Medication Sig Start Date End Date Taking? Authorizing Provider  albuterol (PROVENTIL HFA;VENTOLIN HFA) 108 (90 Base) MCG/ACT inhaler Inhale 1-2 puffs into the lungs every 6 (six) hours as needed for wheezing or shortness of breath. 03/30/18   Couture, Cortni S, PA-C  Aspirin-Acetaminophen-Caffeine (GOODY HEADACHE PO) Take 1 packet by mouth daily as needed (pain).    [provider]  atorvastatin (LIPITOR) 40 MG tablet Take 40 mg by mouth at  bedtime. 10/10/16   [provider]  dicyclomine (BENTYL) 10 MG capsule Take 10 mg by mouth 4 (four) times daily -  before meals and at bedtime.    [provider]  docusate sodium (COLACE) 100 MG capsule Take 100 mg by mouth 2 (two) times daily as needed for mild constipation.    [provider]  doxycycline (VIBRAMYCIN) 100 MG capsule Take 1 capsule (100 mg total) by mouth 2 (two) times daily for 5 days. 08/31/18 09/05/18  Modesta Sammons A, PA-C  Fluticasone-Salmeterol (ADVAIR) 250-50 MCG/DOSE AEPB Inhale 1 puff into the lungs 2 (two) times daily as needed (for seasonal allergy flares).  01/04/16   [provider]  guaiFENesin-codeine (ROBITUSSIN AC) 100-10 MG/5ML syrup Take 5 mLs by mouth 3 (three) times daily as needed for cough. 08/31/18   Emie Sommerfeld A, PA-C  hydrochlorothiazide (HYDRODIURIL) 25 MG tablet Take 25 mg by mouth daily. 11/02/16   [provider]  meloxicam (MOBIC) 15 MG tablet Take 1 tablet (15 mg total) by mouth daily. Take with food each morning 4/0/10   Delora Fuel, MD  methocarbamol (ROBAXIN) 750 MG tablet Take 1 tablet (750 mg total) by mouth 4 (four) times daily as needed (use for muscle cramps/pain). 11/07/16   Jackolyn Confer, MD  olmesartan-hydrochlorothiazide (BENICAR HCT) 40-25 MG tablet Take 1 tablet by mouth daily.    [provider]  oxyCODONE (OXY IR/ROXICODONE) 5 MG immediate release tablet Take 1-2 tablets (5-10 mg total) by mouth every 4 (four) hours as needed for moderate pain, severe pain or breakthrough pain. 06/27/23   Delora Fuel, MD  potassium chloride (K-DUR) 10 MEQ tablet Take 1 tablet (10 mEq total) by mouth daily for 7 days. 03/30/18 04/06/18  Couture, Cortni S, PA-C  POTASSIUM PO Take 1 tablet by mouth 2 (two) times daily.    [provider]  traZODone (DESYREL) 100 MG tablet Take 100 mg by mouth at bedtime as needed for sleep.  11/02/16   [provider]  vitamin B-12  (CYANOCOBALAMIN) 1000 MCG tablet Take 1,000 mcg by mouth daily.    [provider]    Family History Family History  Problem Relation Age of Onset  . Lupus Mother   . Crohn's disease Father   . Cancer Maternal Grandfather        lung    Social History Social History   Tobacco Use  . Smoking status: Current Every Day Smoker    Packs/day: 0.50    Years: 10.00    Pack years: 5.00    Types: Cigarettes  . Smokeless tobacco: Never Used  Substance Use Topics  . Alcohol use: No  . Drug use: No     Allergies   Penicillins   Review of Systems Review of Systems  Constitutional:       Fever 6 days ago.  HENT: Positive for congestion, postnasal drip and rhinorrhea.  Negative for ear discharge, ear pain, facial swelling, sinus pressure, sinus pain, sneezing, sore throat, tinnitus, trouble swallowing and voice change.   Respiratory: Positive for cough. Negative for apnea, choking, chest tightness, shortness of breath and stridor.   Cardiovascular: Negative.   Gastrointestinal: Negative.   Genitourinary: Negative.   Musculoskeletal: Negative.   Skin: Negative.   Neurological: Negative.   All other systems reviewed and are negative.  Physical Exam Updated Vital Signs BP (!) 146/89 (BP Location: Right Arm)   Pulse 78   Temp 98.7 F (37.1 C) (Oral)   Resp 16   Ht 6' (1.829 m)   Wt 72.6 kg   SpO2 96%   BMI 21.70 kg/m   Physical Exam Vitals signs and nursing note reviewed.  Constitutional:      General: She is not in acute distress.    Appearance: She is not ill-appearing, toxic-appearing or diaphoretic.  HENT:     Head: Normocephalic and atraumatic.     Jaw: There is normal jaw occlusion.     Right Ear: Tympanic membrane, ear canal and external ear normal. There is no impacted cerumen. No hemotympanum. Tympanic membrane is not injected, scarred, perforated, erythematous, retracted or bulging.     Left Ear: Tympanic membrane, ear canal and external ear normal.  There is no impacted cerumen. No hemotympanum. Tympanic membrane is not injected, scarred, perforated, erythematous, retracted or bulging.     Ears:     Comments: No Mastoid tenderness.    Nose:     Comments: Clear rhinorrhea and congestion to bilateral nares.  No sinus tenderness.    Mouth/Throat:     Comments: Posterior oropharynx clear.  Mucous membranes moist.  Tonsils without erythema or exudate.  Uvula midline without deviation.  No evidence of PTA or RPA.  No drooling, dysphasia or trismus.  Phonation normal. Neck:     Trachea: Trachea and phonation normal.     Meningeal: Brudzinski's sign and Kernig's sign absent.     Comments: No Neck stiffness or neck rigidity.  No meningismus.  No cervical lymphadenopathy. Cardiovascular:     Comments: No murmurs rubs or gallops. Pulmonary:     Effort: No tachypnea, accessory muscle usage, respiratory distress or retractions.     Comments: Lungs with mild diffuse wheezing and rhonchi throughout.  No accessory muscle usage.  Able speak in full sentences. No tachypnea. Chest:     Comments: No chest wall TTP. Abdominal:     Comments: Soft, nontender without rebound or guarding.  No CVA tenderness.  Musculoskeletal:     Comments: Moves all 4 extremities without difficulty.  Lower extremities without edema, erythema or warmth.  Skin:    Comments: Brisk capillary refill.  No rashes or lesions.  Neurological:     Mental Status: She is alert.     Comments: Ambulatory in department without difficulty.  Cranial nerves II through XII grossly intact.  No facial droop.  No aphasia.    ED Treatments / Results  Labs (all labs ordered are listed, but only abnormal results are displayed) Labs Reviewed - No data to display  EKG None  Radiology Dg Chest Portable 1 View  Result Date: 08/31/2018 CLINICAL DATA:  Cough.  Flu like symptoms for 9 days. EXAM: PORTABLE CHEST 1 VIEW COMPARISON:  Chest x-ray and chest CT dated 03/30/2018 FINDINGS: The heart  size and mediastinal contours are within normal limits. Both lungs are clear. The visualized skeletal structures are unremarkable. IMPRESSION: Normal exam. Electronically Signed   By:  Lorriane Shire M.D.   On: 08/31/2018 16:06    Procedures Procedures (including critical care time)  Medications Ordered in ED Medications  albuterol (PROVENTIL HFA;VENTOLIN HFA) 108 (90 Base) MCG/ACT inhaler 2 puff (2 puffs Inhalation Given 08/31/18 1529)     Initial Impression / Assessment and Plan / ED Course  I have reviewed the triage vital signs and the nursing notes.  Pertinent labs & imaging results that were available during my care of the patient were reviewed by me and considered in my medical decision making (see chart for details).  43 year old who appears otherwise well presents for evaluation of cough. Afebrile, non septic, non ill appearing. Febrile 6 days ago. Cough productive of yellow and green sputum. Hx of chronic bronchitis. Lungs with diffuse wheeze and rhonchi. No assessory muscle usage, tachypnea, tachycardia or hypoxia. Mucous membranes moist. Abdomen soft without rebound or guarding. No neck stiffness or neck rigidity. No meningismus. Low suspicion for meningitis. No evidence of otitis.  Posterior OP Clear.  Mucous membranes moist.  Tonsils without erythema or exudate.  Uvula midline without deviation.  No drooling, dysphasia or trismus. Low suspicion for pharyngitis. 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 No chest pain, lower extremity edema, erythema, warmth or tenderness to bilateral calves. No hx of PE/DVT. No hemoptysis, recent surgery or recent travel. Low suspicion for PE, cardiac cause of cough. No medications with side effects of cough. Likely UR infection. Chest xray without infiltrates, pulmonary  edema, pneumothorax, cardiomegaly. No lower extremity swelling to suggest fluid overload.  On re-evalution lung with improvement in wheezing. Still mild rhonchi consistent with an early pneumonia. Given exam will tx with ABX and symptomatic management.  Patient does not meet SIRS or sepsis criteria.  She is afebrile, no tachycardia, tachypnea or hypoxia.  She is able to ambulate with oxygen saturation greater than 95% on room air.  Patient hemodynamically stable and appropriate for DC home at this time.  Discussed return precautions with patient.  Patient voiced understanding is agreeable to follow-up.  Discussed with patient she does not meet CDC or [place] criteria to test for coronavirus, however we cannot rule this out.  Patient to stay out of work until symptoms with improve.  Patient follow-up with PCP for symptoms are unresolved.     [name] was evaluated in Emergency Department on 08/31/2018 for the symptoms described in the history of present illness. She was evaluated in the context of the global COVID-19 pandemic, which necessitated consideration that the patient might be at risk for infection with the SARS-CoV-2 virus that causes COVID-19. Institutional protocols and algorithms that pertain to the evaluation of patients at risk for COVID-19 are in a state of rapid change based on information released by regulatory bodies including the CDC and federal and state organizations. These policies and algorithms were followed during the patient's care in the ED. Final Clinical Impressions(s) / ED Diagnoses   Final diagnoses:  Community acquired pneumonia, unspecified laterality    ED Discharge Orders         Ordered    guaiFENesin-codeine (ROBITUSSIN AC) 100-10 MG/5ML syrup  3 times daily PRN     08/31/18 1628    doxycycline (VIBRAMYCIN) 100 MG capsule  2 times daily     08/31/18 1628  Oswin Griffith A, PA-C 08/31/18 1644    Fredia Sorrow, MD 09/02/18 [phone]

## 2018-08-31 NOTE — ED Triage Notes (Signed)
C/o flu like sx x 9 days-NAD-steady gait 

## 2018-08-31 NOTE — ED Notes (Signed)
Pt verbalizes understanding of d/c instructions and denies any further needs at this time. 

## 2018-08-31 NOTE — ED Notes (Signed)
X-ray at bedside

## 2018-09-30 ENCOUNTER — Encounter (HOSPITAL_COMMUNITY): Payer: Self-pay

## 2018-09-30 ENCOUNTER — Emergency Department (HOSPITAL_BASED_OUTPATIENT_CLINIC_OR_DEPARTMENT_OTHER)
Admission: EM | Admit: 2018-09-30 | Discharge: 2018-09-30 | Disposition: A | Discharge status: Home / Self Care | Payer: 59 | Source: Home / Self Care | Attending: Emergency Medicine | Admitting: Emergency Medicine

## 2018-09-30 ENCOUNTER — Other Ambulatory Visit: Payer: Self-pay

## 2018-09-30 ENCOUNTER — Emergency Department (HOSPITAL_COMMUNITY)
Admission: EM | Admit: 2018-09-30 | Discharge: 2018-09-30 | Disposition: A | Discharge status: Home / Self Care | Payer: 59 | Attending: Emergency Medicine | Admitting: Emergency Medicine

## 2018-09-30 ENCOUNTER — Encounter (HOSPITAL_BASED_OUTPATIENT_CLINIC_OR_DEPARTMENT_OTHER): Payer: Self-pay | Admitting: Emergency Medicine

## 2018-09-30 DIAGNOSIS — Z5321 Procedure and treatment not carried out due to patient leaving prior to being seen by health care provider: Secondary | ICD-10-CM | POA: Insufficient documentation

## 2018-09-30 DIAGNOSIS — F17210 Nicotine dependence, cigarettes, uncomplicated: Secondary | ICD-10-CM | POA: Insufficient documentation

## 2018-09-30 DIAGNOSIS — L02213 Cutaneous abscess of chest wall: Secondary | ICD-10-CM | POA: Insufficient documentation

## 2018-09-30 DIAGNOSIS — Z79899 Other long term (current) drug therapy: Secondary | ICD-10-CM | POA: Insufficient documentation

## 2018-09-30 DIAGNOSIS — I10 Essential (primary) hypertension: Secondary | ICD-10-CM | POA: Insufficient documentation

## 2018-09-30 DIAGNOSIS — L0291 Cutaneous abscess, unspecified: Secondary | ICD-10-CM

## 2018-09-30 DIAGNOSIS — N611 Abscess of the breast and nipple: Secondary | ICD-10-CM | POA: Diagnosis present

## 2018-09-30 MED ORDER — SULFAMETHOXAZOLE-TRIMETHOPRIM 800-160 MG PO TABS
2.0000 | ORAL_TABLET | Freq: Once | ORAL | Status: AC
  Administered 2018-09-30: 2 via ORAL
  Filled 2018-09-30: qty 2

## 2018-09-30 MED ORDER — SULFAMETHOXAZOLE-TRIMETHOPRIM 800-160 MG PO TABS: 1.0000 | ORAL_TABLET | Freq: Two times a day (BID) | ORAL | 2 refills | Status: DC

## 2018-09-30 MED ORDER — LIDOCAINE-EPINEPHRINE 2 %-1:100000 IJ SOLN
20.0000 mL | Freq: Once | INTRAMUSCULAR | Status: DC
  Filled 2018-09-30: qty 20

## 2018-09-30 MED ORDER — LIDOCAINE-EPINEPHRINE (PF) 2 %-1:200000 IJ SOLN
INTRAMUSCULAR | Status: AC
  Filled 2018-09-30: qty 10

## 2018-09-30 MED ORDER — LIDOCAINE-EPINEPHRINE-TETRACAINE (LET) SOLUTION
3.0000 mL | Freq: Once | NASAL | Status: AC
  Administered 2018-09-30: 3 mL via TOPICAL
  Filled 2018-09-30: qty 3

## 2018-09-30 MED ORDER — HYDROCODONE-ACETAMINOPHEN 5-325 MG PO TABS
1.0000 | ORAL_TABLET | Freq: Once | ORAL | Status: AC
  Administered 2018-09-30: 1 via ORAL
  Filled 2018-09-30: qty 1

## 2018-09-30 MED ORDER — LIDOCAINE-EPINEPHRINE (PF) 2 %-1:200000 IJ SOLN
10.0000 mL | Freq: Once | INTRAMUSCULAR | Status: AC
  Administered 2018-09-30: 10 mL via INTRADERMAL
  Filled 2018-09-30: qty 10

## 2018-09-30 NOTE — ED Provider Notes (Signed)
[facility] EMERGENCY DEPARTMENT Provider Note   CSN: [id number] Arrival date & time: 09/30/18  0132    History   Chief Complaint Chief Complaint  Patient presents with  . Abscess    HPI [name] is a 43 y.o. female.     Patient presents to the emergency department for evaluation of abscess on her chest.  Patient reports that symptoms began 1 week ago.  She has a history of recurrent pilonidal abscess and other abscesses requiring drainage in the past.  She has been digging at this wound with a scalpel but it has not drained.  The area has become more swollen, red and painful.  She reports that tonight she has started to feel ill.  She feels weak and like she is going to pass out.     Past Medical History:  Diagnosis Date  . Adnexal cyst 2013   right  . Anemia 2015   "pernicious" and iron deficiency.  as of 2015/2016 no longer requiring po iron or B12 shots.   . 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 arthroscopy x 2.   . Colitis   . Headache(784.0)    migraines  . Hydradenitis 2001   axillary, multiple surgical I & Ds/excisions.  . Hypertension   . Nausea & vomiting 11/28/2014  . Obesity (BMI 30.0-34.9)   . Pilonidal cyst   . Pityriasis rosea    pt also gives hx of seborrheic dermatitis.     Patient Active Problem List   Diagnosis Date Noted  . Infectious colitis 11/21/2015  . Chronic low back pain 11/21/2015  . Chronic asthmatic bronchitis ([place]) 11/21/2015  . Bacterial vaginosis 11/21/2015  . Elevated lipase 12/14/2014  . AP (abdominal pain) 12/14/2014  . Nausea with vomiting 12/14/2014  . Abdominal pain, epigastric   . Nausea and vomiting 11/29/2014  . Intractable abdominal pain 11/28/2014  . Hypokalemia 11/28/2014  . HTN (hypertension) 11/28/2014  . Pilonidal abscess 12/03/2013  . Patellar instability of left knee 06/08/2013    Past Surgical History:  Procedure Laterality Date  . ABDOMINAL HYSTERECTOMY    . CYSTECTOMY     2004 axilla  bil  . ESOPHAGOGASTRODUODENOSCOPY N/A 11/30/2014   Procedure: ESOPHAGOGASTRODUODENOSCOPY (EGD);  Surgeon: Jerene Bears, MD;  Location: [facility] ENDOSCOPY;  Service: Endoscopy;  Laterality: N/A;  . FRACTURE SURGERY     rt arm  . GASTRIC BYPASS  2011   gastric sleeve  . KNEE SURGERY  2005   left  . LAPAROSCOPIC CHOLECYSTECTOMY  2002  . MEDIAL PATELLOFEMORAL LIGAMENT REPAIR Left 06/08/2013   Procedure: DIAGNOSTIC OPERATIVE ARTHROSCOPY, OPEN MEDIAL PATELLA FEMORAL LIGAMENT RECONSTRUCTION;  Surgeon: Meredith Pel, MD;  Location: [facility];  Service: Orthopedics;  Laterality: Left;  with Hamstring autograft  . OVARIAN CYST REMOVAL    . PARTIAL HYSTERECTOMY    . R arm surgery       OB History   No obstetric history on file.      Home Medications    Prior to Admission medications   Medication Sig Start Date End Date Taking? Authorizing Provider  albuterol (PROVENTIL HFA;VENTOLIN HFA) 108 (90 Base) MCG/ACT inhaler Inhale 1-2 puffs into the lungs every 6 (six) hours as needed for wheezing or shortness of breath. 03/30/18   Couture, Cortni S, PA-C  Aspirin-Acetaminophen-Caffeine (GOODY HEADACHE PO) Take 1 packet by mouth daily as needed (pain).    [provider]  atorvastatin (LIPITOR) 40 MG tablet Take 40 mg by mouth at bedtime. 10/10/16  [provider]  dicyclomine (BENTYL) 10 MG capsule Take 10 mg by mouth 4 (four) times daily -  before meals and at bedtime.    [provider]  docusate sodium (COLACE) 100 MG capsule Take 100 mg by mouth 2 (two) times daily as needed for mild constipation.    [provider]  Fluticasone-Salmeterol (ADVAIR) 250-50 MCG/DOSE AEPB Inhale 1 puff into the lungs 2 (two) times daily as needed (for seasonal allergy flares).  01/04/16   [provider]  guaiFENesin-codeine (ROBITUSSIN AC) 100-10 MG/5ML syrup Take 5 mLs by mouth 3 (three) times daily as needed for cough. 08/31/18   Henderly, Britni A, PA-C  hydrochlorothiazide  (HYDRODIURIL) 25 MG tablet Take 25 mg by mouth daily. 11/02/16   [provider]  meloxicam (MOBIC) 15 MG tablet Take 1 tablet (15 mg total) by mouth daily. Take with food each morning 06/24/34   Delora Fuel, MD  methocarbamol (ROBAXIN) 750 MG tablet Take 1 tablet (750 mg total) by mouth 4 (four) times daily as needed (use for muscle cramps/pain). 11/07/16   Jackolyn Confer, MD  olmesartan-hydrochlorothiazide (BENICAR HCT) 40-25 MG tablet Take 1 tablet by mouth daily.    [provider]  oxyCODONE (OXY IR/ROXICODONE) 5 MG immediate release tablet Take 1-2 tablets (5-10 mg total) by mouth every 4 (four) hours as needed for moderate pain, severe pain or breakthrough pain. 10/22/38   Delora Fuel, MD  potassium chloride (K-DUR) 10 MEQ tablet Take 1 tablet (10 mEq total) by mouth daily for 7 days. 03/30/18 04/06/18  Couture, Cortni S, PA-C  POTASSIUM PO Take 1 tablet by mouth 2 (two) times daily.    [provider]  sulfamethoxazole-trimethoprim (BACTRIM DS) 800-160 MG tablet Take 1 tablet by mouth 2 (two) times daily. 09/30/18   Orpah Greek, MD  traZODone (DESYREL) 100 MG tablet Take 100 mg by mouth at bedtime as needed for sleep.  11/02/16   [provider]  vitamin B-12 (CYANOCOBALAMIN) 1000 MCG tablet Take 1,000 mcg by mouth daily.    [provider]    Family History Family History  Problem Relation Age of Onset  . Lupus Mother   . Crohn's disease Father   . Cancer Maternal Grandfather        lung    Social History Social History   Tobacco Use  . Smoking status: Current Every Day Smoker    Packs/day: 0.50    Years: 10.00    Pack years: 5.00    Types: Cigarettes  . Smokeless tobacco: Never Used  Substance Use Topics  . Alcohol use: No  . Drug use: No     Allergies   Penicillins   Review of Systems Review of Systems  Constitutional: Positive for fatigue. Negative for fever.  Skin: Positive for wound.  Neurological: Positive  for dizziness.  All other systems reviewed and are negative.    Physical Exam Updated Vital Signs BP (!) 128/93   Pulse 84   Temp 98.4 F (36.9 C) (Oral)   Resp 16   Ht 6' (1.829 m)   Wt 77.1 kg   SpO2 93%   BMI 23.06 kg/m   Physical Exam Vitals signs and nursing note reviewed.  Constitutional:      General: She is not in acute distress.    Appearance: Normal appearance. She is well-developed.  HENT:     Head: Normocephalic and atraumatic.     Right Ear: Hearing normal.     Left Ear: Hearing  normal.     Nose: Nose normal.  Eyes:     Conjunctiva/sclera: Conjunctivae normal.     Pupils: Pupils are equal, round, and reactive to light.  Neck:     Musculoskeletal: Normal range of motion and neck supple.  Cardiovascular:     Rate and Rhythm: Regular rhythm.     Heart sounds: S1 normal and S2 normal. No murmur. No friction rub. No gallop.   Pulmonary:     Effort: Pulmonary effort is normal. No respiratory distress.     Breath sounds: Normal breath sounds.  Chest:     Chest wall: No tenderness.  Abdominal:     General: Bowel sounds are normal.     Palpations: Abdomen is soft.     Tenderness: There is no abdominal tenderness. There is no guarding or rebound. Negative signs include Murphy's sign and McBurney's sign.     Hernia: No hernia is present.  Musculoskeletal: Normal range of motion.  Skin:    General: Skin is warm and dry.     Findings: Erythema (Central chest, between breasts.  Erythema and induration present.  Small circular opening in the center with foul-smelling drainage.) present. No rash.  Neurological:     Mental Status: She is alert and oriented to person, place, and time.     GCS: GCS eye subscore is 4. GCS verbal subscore is 5. GCS motor subscore is 6.     Cranial Nerves: No cranial nerve deficit.     Sensory: No sensory deficit.     Coordination: Coordination normal.  Psychiatric:        Speech: Speech normal.        Behavior: Behavior normal.         Thought Content: Thought content normal.      ED Treatments / Results  Labs (all labs ordered are listed, but only abnormal results are displayed) Labs Reviewed - No data to display  EKG None  Radiology No results found.  Procedures .Marland KitchenIncision and Drainage Date/Time: 09/30/2018 3:08 AM Performed by: Orpah Greek, MD Authorized by: Orpah Greek, MD   Consent:    Consent obtained:  Verbal   Consent given by:  Patient   Risks discussed:  Bleeding, incomplete drainage and pain Universal protocol:    Procedure explained and questions answered to patient or proxy's satisfaction: yes     Site/side marked: yes     Immediately prior to procedure a time out was called: yes     Patient identity confirmed:  Verbally with patient Location:    Type:  Abscess   Location:  Trunk   Trunk location:  Chest Pre-procedure details:    Skin preparation:  Betadine Anesthesia (see MAR for exact dosages):    Anesthesia method:  Topical application and local infiltration   Topical anesthetic:  LET   Local anesthetic:  Lidocaine 2% WITH epi Procedure type:    Complexity:  Simple Procedure details:    Needle aspiration: no     Incision type: 2 separate straight incisions.   Incision depth:  Dermal   Scalpel blade:  11   Wound management:  Probed and deloculated   Drainage:  Bloody   Drainage amount:  Scant   Wound treatment:  Wound left open Post-procedure details:    Patient tolerance of procedure:  Tolerated well, no immediate complications   (including critical care time)  Medications Ordered in ED Medications  sulfamethoxazole-trimethoprim (BACTRIM DS) 800-160 MG per tablet 2 tablet (has no administration in  time range)  HYDROcodone-acetaminophen (NORCO/VICODIN) 5-325 MG per tablet 1 tablet (1 tablet Oral Given 09/30/18 0201)  lidocaine-EPINEPHrine (XYLOCAINE W/EPI) 2 %-1:200000 (PF) injection 10 mL (10 mLs Intradermal Given by Other 09/30/18 0202)   lidocaine-EPINEPHrine-tetracaine (LET) solution (3 mLs Topical Given 09/30/18 0206)     Initial Impression / Assessment and Plan / ED Course  I have reviewed the triage vital signs and the nursing notes.  Pertinent labs & imaging results that were available during my care of the patient were reviewed by me and considered in my medical decision making (see chart for details).        Patient presents to the emergency department for evaluation of a skin abscess.  She has a history of recurrent abscesses.  Patient does have a draining wound in the center of her chest where she has been trying to drain the abscess herself with a "scalpel".  Recommended opening this area to ensure complete drainage.  Patient began to inappropriately ask for Dilaudid.  She reports that "every time I get an abscess drained, I get it".  Reviewing her records does reveal that she has received Dilaudid in the past, although I do not feel that this is warranted or appropriate at this time.  She was informed that she would not get any Dilaudid prior to procedure.  She was treated topically with let followed by pain ease freeze spray and then was injected with lidocaine with epinephrine.  This combination worked well, she did not feel any pain.  Incision was made in 2 separate areas over the indurated regions.  Minimal drainage was encountered.  Wound left open, will initiate Bactrim, given return precautions.  Final Clinical Impressions(s) / ED Diagnoses   Final diagnoses:  Abscess    ED Discharge Orders         Ordered    sulfamethoxazole-trimethoprim (BACTRIM DS) 800-160 MG tablet  2 times daily     09/30/18 0307           Orpah Greek, MD 09/30/18 [phone]

## 2018-09-30 NOTE — ED Notes (Addendum)
Pt. Stated "I'm going to go to the one on [address], it's taking too long". Pt. Has left. Pulling OTF.

## 2018-09-30 NOTE — ED Triage Notes (Signed)
Pt has abscess to her L breast for the past week, minimal drainage, lots of redness, states some fevers at home

## 2018-09-30 NOTE — ED Triage Notes (Signed)
Pt reports abscess on chest x 1 week.

## 2018-10-15 ENCOUNTER — Encounter (HOSPITAL_BASED_OUTPATIENT_CLINIC_OR_DEPARTMENT_OTHER): Payer: Self-pay

## 2018-10-15 ENCOUNTER — Emergency Department (HOSPITAL_BASED_OUTPATIENT_CLINIC_OR_DEPARTMENT_OTHER): Payer: 59

## 2018-10-15 ENCOUNTER — Other Ambulatory Visit: Payer: Self-pay

## 2018-10-15 ENCOUNTER — Emergency Department (HOSPITAL_BASED_OUTPATIENT_CLINIC_OR_DEPARTMENT_OTHER)
Admission: EM | Admit: 2018-10-15 | Discharge: 2018-10-16 | Disposition: A | Discharge status: Home / Self Care | Payer: 59 | Attending: Emergency Medicine | Admitting: Emergency Medicine

## 2018-10-15 DIAGNOSIS — W228XXA Striking against or struck by other objects, initial encounter: Secondary | ICD-10-CM | POA: Insufficient documentation

## 2018-10-15 DIAGNOSIS — S92501A Displaced unspecified fracture of right lesser toe(s), initial encounter for closed fracture: Secondary | ICD-10-CM | POA: Diagnosis not present

## 2018-10-15 DIAGNOSIS — F17210 Nicotine dependence, cigarettes, uncomplicated: Secondary | ICD-10-CM | POA: Diagnosis not present

## 2018-10-15 DIAGNOSIS — Z79899 Other long term (current) drug therapy: Secondary | ICD-10-CM | POA: Diagnosis not present

## 2018-10-15 DIAGNOSIS — Y9301 Activity, walking, marching and hiking: Secondary | ICD-10-CM | POA: Insufficient documentation

## 2018-10-15 DIAGNOSIS — Y929 Unspecified place or not applicable: Secondary | ICD-10-CM | POA: Insufficient documentation

## 2018-10-15 DIAGNOSIS — S99921A Unspecified injury of right foot, initial encounter: Secondary | ICD-10-CM | POA: Diagnosis present

## 2018-10-15 DIAGNOSIS — I10 Essential (primary) hypertension: Secondary | ICD-10-CM | POA: Diagnosis not present

## 2018-10-15 DIAGNOSIS — Y998 Other external cause status: Secondary | ICD-10-CM | POA: Insufficient documentation

## 2018-10-15 MED ORDER — NAPROXEN 250 MG PO TABS
500.0000 mg | ORAL_TABLET | Freq: Once | ORAL | Status: AC
  Administered 2018-10-15: 500 mg via ORAL
  Filled 2018-10-15: qty 2

## 2018-10-15 MED ORDER — NAPROXEN 375 MG PO TABS: ORAL_TABLET | ORAL | 0 refills | Status: DC

## 2018-10-15 NOTE — ED Provider Notes (Signed)
[facility] DEPT [facility] Provider Note: [name], MD, FACEP  CSN: [id number] MRN: [id number] ARRIVAL: 10/15/18 at 2214 ROOM: MH06/MH06   CHIEF COMPLAINT  Toe Pain   HISTORY OF PRESENT ILLNESS  10/15/18 10:50 PM [name] is a 43 y.o. female who stubbed her right second toe on May 10 of this year.  She has had persistent pain which worsened today, despite taking Tylenol and ibuprofen.  She is also tried buddy taping it but this did not provide much relief.  She has attempted to stay off of it and tries to weight-bear on her heel only.  She rates the pain as a 7 out of 10, worse with movement or ambulation.    Past Medical History:  Diagnosis Date  . Adnexal cyst 2013   right  . Anemia 2015   Pernicious and iron deficiency. As of 2015/2016 no longer requiring po iron or B12 shots.   . 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 arthroscopy x 2.   . Colitis   . Headache(784.0)    migraines  . Hydradenitis 2001   axillary, multiple surgical I & Ds/excisions.  . Hypertension   . Nausea & vomiting 11/28/2014  . Obesity (BMI 30.0-34.9)   . Pilonidal cyst   . Pityriasis rosea    pt also gives hx of seborrheic dermatitis.     Past Surgical History:  Procedure Laterality Date  . ABDOMINAL HYSTERECTOMY    . CYSTECTOMY     2004 axilla bil  . ESOPHAGOGASTRODUODENOSCOPY N/A 11/30/2014   Procedure: ESOPHAGOGASTRODUODENOSCOPY (EGD);  Surgeon: Jerene Bears, MD;  [facility]: [facility] ENDOSCOPY;  Service: Endoscopy;  Laterality: N/A;  . FRACTURE SURGERY     rt arm  . GASTRIC BYPASS  2011   gastric sleeve  . KNEE SURGERY  2005   left  . LAPAROSCOPIC CHOLECYSTECTOMY  2002  . MEDIAL PATELLOFEMORAL LIGAMENT REPAIR Left 06/08/2013   Procedure: DIAGNOSTIC OPERATIVE ARTHROSCOPY, OPEN MEDIAL PATELLA FEMORAL LIGAMENT RECONSTRUCTION;  Surgeon: Meredith Pel, MD;  Location: [facility];  Service: Orthopedics;  Laterality: Left;  with Hamstring autograft  . OVARIAN CYST REMOVAL    . PARTIAL  HYSTERECTOMY    . R arm surgery      Family History  Problem Relation Age of Onset  . Lupus Mother   . Crohn's disease Father   . Cancer Maternal Grandfather        lung    Social History   Tobacco Use  . Smoking status: Current Every Day Smoker    Packs/day: 0.50    Years: 10.00    Pack years: 5.00    Types: Cigarettes  . Smokeless tobacco: Never Used  Substance Use Topics  . Alcohol use: Yes    Comment: occ  . Drug use: No    Prior to Admission medications   Medication Sig Start Date End Date Taking? Authorizing Provider  albuterol (PROVENTIL HFA;VENTOLIN HFA) 108 (90 Base) MCG/ACT inhaler Inhale 1-2 puffs into the lungs every 6 (six) hours as needed for wheezing or shortness of breath. 03/30/18   Couture, Cortni S, PA-C  Aspirin-Acetaminophen-Caffeine (GOODY HEADACHE PO) Take 1 packet by mouth daily as needed (pain).    [provider]  atorvastatin (LIPITOR) 40 MG tablet Take 40 mg by mouth at bedtime. 10/10/16   [provider]  dicyclomine (BENTYL) 10 MG capsule Take 10 mg by mouth 4 (four) times daily -  before meals and at bedtime.    [provider]  docusate sodium (  COLACE) 100 MG capsule Take 100 mg by mouth 2 (two) times daily as needed for mild constipation.    [provider]  Fluticasone-Salmeterol (ADVAIR) 250-50 MCG/DOSE AEPB Inhale 1 puff into the lungs 2 (two) times daily as needed (for seasonal allergy flares).  01/04/16   [provider]  guaiFENesin-codeine (ROBITUSSIN AC) 100-10 MG/5ML syrup Take 5 mLs by mouth 3 (three) times daily as needed for cough. 08/31/18   Henderly, Britni A, PA-C  hydrochlorothiazide (HYDRODIURIL) 25 MG tablet Take 25 mg by mouth daily. 11/02/16   [provider]  meloxicam (MOBIC) 15 MG tablet Take 1 tablet (15 mg total) by mouth daily. Take with food each morning 7/0/62   Delora Fuel, MD  methocarbamol (ROBAXIN) 750 MG tablet Take 1 tablet (750 mg total) by mouth 4 (four) times  daily as needed (use for muscle cramps/pain). 11/07/16   Jackolyn Confer, MD  olmesartan-hydrochlorothiazide (BENICAR HCT) 40-25 MG tablet Take 1 tablet by mouth daily.    [provider]  oxyCODONE (OXY IR/ROXICODONE) 5 MG immediate release tablet Take 1-2 tablets (5-10 mg total) by mouth every 4 (four) hours as needed for moderate pain, severe pain or breakthrough pain. 07/24/60   Delora Fuel, MD  potassium chloride (K-DUR) 10 MEQ tablet Take 1 tablet (10 mEq total) by mouth daily for 7 days. 03/30/18 04/06/18  Couture, Cortni S, PA-C  POTASSIUM PO Take 1 tablet by mouth 2 (two) times daily.    [provider]  sulfamethoxazole-trimethoprim (BACTRIM DS) 800-160 MG tablet Take 1 tablet by mouth 2 (two) times daily. 09/30/18   Orpah Greek, MD  traZODone (DESYREL) 100 MG tablet Take 100 mg by mouth at bedtime as needed for sleep.  11/02/16   [provider]  vitamin B-12 (CYANOCOBALAMIN) 1000 MCG tablet Take 1,000 mcg by mouth daily.    [provider]    Allergies Penicillins   REVIEW OF SYSTEMS  Negative except as noted here or in the History of Present Illness.   PHYSICAL EXAMINATION  Initial Vital Signs Blood pressure (!) 144/100, pulse (!) 107, temperature 99.5 F (37.5 C), temperature source Oral, resp. rate 16, height 6' (1.829 m), weight 77.8 kg, SpO2 99 %.  Examination General: Well-developed, well-nourished female in no acute distress; appearance consistent with age of record HENT: normocephalic; atraumatic Eyes: Normal appearance Neck: supple Heart: regular rate and rhythm Lungs: Normal respiratory effort and excursion Abdomen: soft; nondistended Extremities: No deformity; tenderness and ecchymosis of right second toe with decreased range of motion:    Neurologic: Awake, alert and oriented; motor function intact in all extremities and symmetric; no facial droop Skin: Warm and dry Psychiatric: Normal mood and affect    RESULTS  Summary of this visit's results, reviewed by myself:   EKG Interpretation  Date/Time:    Ventricular Rate:    PR Interval:    QRS Duration:   QT Interval:    QTC Calculation:   R Axis:     Text Interpretation:        Laboratory Studies: No results found for this or any previous visit (from the past 24 hour(s)). Imaging Studies: Dg Foot Complete Right  Result Date: 10/15/2018 CLINICAL DATA:  Initial evaluation for recent trauma to second digit. EXAM: RIGHT FOOT COMPLETE - 3+ VIEW COMPARISON:  None. FINDINGS: Acute nondisplaced fracture through the base of the right second proximal phalanx. No intra-articular extension. No other acute osseous abnormality. Joint spaces maintained. Posterior plantar calcaneal enthesophytes noted. No soft tissue  abnormality. IMPRESSION: Acute nondisplaced fracture through the base of the right second proximal phalanx. Electronically Signed   By: Jeannine Boga M.D.   On: 10/15/2018 23:34    ED COURSE and MDM  Nursing notes and initial vitals signs, including pulse oximetry, reviewed.  Vitals:   10/15/18 2223  BP: (!) 144/100  Pulse: (!) 107  Resp: 16  Temp: 99.5 F (37.5 C)  TempSrc: Oral  SpO2: 99%  Weight: 77.8 kg  Height: 6' (1.829 m)   Patient placed in postop shoe.  PROCEDURES    ED DIAGNOSES     ICD-10-CM   1. Closed fracture of phalanx of right second toe, initial encounter S92.501A        Eulalah Rupert, MD 10/15/18 2340

## 2018-10-15 NOTE — ED Triage Notes (Signed)
Pt c/o pain and swelling to R second toe. Pt stumped the toe on 5/10, but the pain has persisted and the pain became worse today.

## 2018-10-16 NOTE — ED Notes (Addendum)
Right 2nd and 3rd toes buddy taped and instructed patient how to do as well; discussed checking for good cap refill and assure no numbness. Post op shoe applied as well.

## 2019-01-21 ENCOUNTER — Encounter (HOSPITAL_BASED_OUTPATIENT_CLINIC_OR_DEPARTMENT_OTHER): Payer: Self-pay | Admitting: Emergency Medicine

## 2019-01-21 ENCOUNTER — Emergency Department (HOSPITAL_BASED_OUTPATIENT_CLINIC_OR_DEPARTMENT_OTHER)
Admission: EM | Admit: 2019-01-21 | Discharge: 2019-01-21 | Disposition: A | Discharge status: Home / Self Care | Payer: 59 | Attending: Emergency Medicine | Admitting: Emergency Medicine

## 2019-01-21 ENCOUNTER — Encounter (HOSPITAL_BASED_OUTPATIENT_CLINIC_OR_DEPARTMENT_OTHER): Payer: Self-pay | Admitting: Adult Health

## 2019-01-21 ENCOUNTER — Other Ambulatory Visit: Payer: Self-pay

## 2019-01-21 ENCOUNTER — Emergency Department (HOSPITAL_BASED_OUTPATIENT_CLINIC_OR_DEPARTMENT_OTHER)
Admission: EM | Admit: 2019-01-21 | Discharge: 2019-01-22 | Disposition: A | Discharge status: Home / Self Care | Payer: Medicaid Other | Attending: Emergency Medicine | Admitting: Emergency Medicine

## 2019-01-21 ENCOUNTER — Emergency Department (HOSPITAL_COMMUNITY): Admission: EM | Admit: 2019-01-21 | Discharge: 2019-01-21 | Discharge status: Against Medical Advice | Payer: Self-pay

## 2019-01-21 DIAGNOSIS — M7989 Other specified soft tissue disorders: Secondary | ICD-10-CM

## 2019-01-21 DIAGNOSIS — F17210 Nicotine dependence, cigarettes, uncomplicated: Secondary | ICD-10-CM | POA: Insufficient documentation

## 2019-01-21 DIAGNOSIS — F5102 Adjustment insomnia: Secondary | ICD-10-CM

## 2019-01-21 DIAGNOSIS — I10 Essential (primary) hypertension: Secondary | ICD-10-CM | POA: Insufficient documentation

## 2019-01-21 DIAGNOSIS — H1033 Unspecified acute conjunctivitis, bilateral: Secondary | ICD-10-CM

## 2019-01-21 DIAGNOSIS — Z79899 Other long term (current) drug therapy: Secondary | ICD-10-CM | POA: Insufficient documentation

## 2019-01-21 LAB — CBC WITH DIFFERENTIAL/PLATELET
Abs Immature Granulocytes: 0.02 10*3/uL (ref 0.00–0.07)
Basophils Absolute: 0 10*3/uL (ref 0.0–0.1)
Basophils Relative: 0 %
Eosinophils Absolute: 0 10*3/uL (ref 0.0–0.5)
Eosinophils Relative: 1 %
HCT: 38.7 % (ref 36.0–46.0)
Hemoglobin: 13 g/dL (ref 12.0–15.0)
Immature Granulocytes: 0 %
Lymphocytes Relative: 26 %
Lymphs Abs: 1.2 10*3/uL (ref 0.7–4.0)
MCH: 38.1 pg — ABNORMAL HIGH (ref 26.0–34.0)
MCHC: 33.6 g/dL (ref 30.0–36.0)
MCV: 113.5 fL — ABNORMAL HIGH (ref 80.0–100.0)
Monocytes Absolute: 0.4 10*3/uL (ref 0.1–1.0)
Monocytes Relative: 9 %
Neutro Abs: 3 10*3/uL (ref 1.7–7.7)
Neutrophils Relative %: 64 %
Platelets: 161 10*3/uL (ref 150–400)
RBC: 3.41 MIL/uL — ABNORMAL LOW (ref 3.87–5.11)
RDW: 18.1 % — ABNORMAL HIGH (ref 11.5–15.5)
Smear Review: NORMAL
WBC: 4.7 10*3/uL (ref 4.0–10.5)
nRBC: 0 % (ref 0.0–0.2)

## 2019-01-21 LAB — BASIC METABOLIC PANEL
Anion gap: 12 (ref 5–15)
BUN: 8 mg/dL (ref 6–20)
CO2: 29 mmol/L (ref 22–32)
Calcium: 8.4 mg/dL — ABNORMAL LOW (ref 8.9–10.3)
Chloride: 99 mmol/L (ref 98–111)
Creatinine, Ser: 0.73 mg/dL (ref 0.44–1.00)
GFR calc Af Amer: >60 mL/min (ref >60)
GFR calc non Af Amer: >60 mL/min (ref >60)
Glucose, Bld: 80 mg/dL (ref 70–99)
Potassium: 3.5 mmol/L (ref 3.5–5.1)
Sodium: 140 mmol/L (ref 135–145)

## 2019-01-21 LAB — TROPONIN I (HIGH SENSITIVITY): Troponin I (High Sensitivity): 6 ng/L (ref <18)

## 2019-01-21 MED ORDER — OFLOXACIN 0.3 % OP SOLN
1.0000 [drp] | OPHTHALMIC | Status: DC
  Administered 2019-01-21: 1 [drp] via OPHTHALMIC
  Filled 2019-01-21: qty 5

## 2019-01-21 MED ORDER — SODIUM CHLORIDE 0.9% FLUSH
3.0000 mL | Freq: Once | INTRAVENOUS | Status: DC
  Filled 2019-01-21: qty 3

## 2019-01-21 NOTE — ED Notes (Signed)
Notified lab to add on BNP and DDimer.

## 2019-01-21 NOTE — ED Notes (Signed)
Pt. Sees the sink turn into a doll house while RN in room  And the handle is a doll she said.

## 2019-01-21 NOTE — ED Triage Notes (Signed)
Pt c/o drainage from right eye.

## 2019-01-21 NOTE — ED Notes (Signed)
Pt. Said she has not slept in 3 days

## 2019-01-21 NOTE — ED Provider Notes (Signed)
[place] [facility] Provider Note: [name], MD, FACEP  CSN: [id number] MRN: [id number] ARRIVAL: 01/21/19 at 2150 ROOM: [facility]  Leg Swelling and Hallucinations   HISTORY OF PRESENT ILLNESS  01/21/19 11:18 PM [name] is a 43 y.o. female who was seen by myself this morning for 4 days of eye irritation.  She was diagnosed with conjunctivitis and discharged on Ocuflox.  She has only taken 1 dose.  She did not volunteer this this morning but she has not slept well for the past 4 days.  She did not sleep any last night.  She attributes this to stress.  She feels very fatigued and weak.  Today she has had episodes where she was looking at objects and they appeared to be people moving around.  She realizes that they are not people and that these hallucinations are false.  She is here now because of bilateral lower extremity swelling that began, when she first noticed it, about 9 PM.  The swelling is worse on the left than the right.  There is no associated erythema, warmth or pain.  She has also had some discomfort in her left axilla and clavicle which began about 1 PM.  She denies this being an actual pain but rather "stress" or discomfort.  She rated that as a 7 out of 10 on arrival.  She has also had a cough and chills but no fever.  The cough is been chronic for years.   Past Medical History:  Diagnosis Date   Adnexal cyst 2013   right   Anemia 2015   Pernicious and iron deficiency. As of 2015/2016 no longer requiring po iron or B12 shots.    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 arthroscopy x 2.    Colitis    Headache(784.0)    migraines   Hydradenitis 2001   axillary, multiple surgical I & Ds/excisions.   Hypertension    Nausea & vomiting 11/28/2014   Obesity (BMI 30.0-34.9)    Pilonidal cyst    Pityriasis rosea    pt also gives hx of seborrheic dermatitis.     Past Surgical History:  Procedure Laterality Date   ABDOMINAL  HYSTERECTOMY     CYSTECTOMY     2004 axilla bil   ESOPHAGOGASTRODUODENOSCOPY N/A 11/30/2014   Procedure: ESOPHAGOGASTRODUODENOSCOPY (EGD);  Surgeon: Jerene Bears, MD;  Location: [facility] ENDOSCOPY;  Service: Endoscopy;  Laterality: N/A;   FRACTURE SURGERY     rt arm   GASTRIC BYPASS  2011   gastric sleeve   KNEE SURGERY  2005   left   LAPAROSCOPIC CHOLECYSTECTOMY  2002   MEDIAL PATELLOFEMORAL LIGAMENT REPAIR Left 06/08/2013   Procedure: DIAGNOSTIC OPERATIVE ARTHROSCOPY, OPEN MEDIAL PATELLA FEMORAL LIGAMENT RECONSTRUCTION;  Surgeon: Meredith Pel, MD;  Location: [place];  Service: Orthopedics;  Laterality: Left;  with Hamstring autograft   OVARIAN CYST REMOVAL     PARTIAL HYSTERECTOMY     R arm surgery      Family History  Problem Relation Age of Onset   Lupus Mother    Crohn's disease Father    Cancer Maternal Grandfather        lung    Social History   Tobacco Use   Smoking status: Current Every Day Smoker    Packs/day: 0.50    Years: 10.00    Pack years: 5.00    Types: Cigarettes   Smokeless tobacco: Never Used  Substance Use Topics   Alcohol  use: Yes    Comment: occ   Drug use: No    Prior to Admission medications   Medication Sig Start Date End Date Taking? Authorizing Provider  albuterol (PROVENTIL HFA;VENTOLIN HFA) 108 (90 Base) MCG/ACT inhaler Inhale 1-2 puffs into the lungs every 6 (six) hours as needed for wheezing or shortness of breath. 03/30/18   Couture, Cortni S, PA-C  ALPRAZolam (XANAX) 0.25 MG tablet Take 0.25 mg by mouth 4 (four) times daily. 09/28/18   [provider]  Aspirin-Acetaminophen-Caffeine (GOODY HEADACHE PO) Take 1 packet by mouth daily as needed (pain).    [provider]  atorvastatin (LIPITOR) 40 MG tablet Take 40 mg by mouth at bedtime. 10/10/16   [provider]  dicyclomine (BENTYL) 10 MG capsule Take 10 mg by mouth 4 (four) times daily -  before meals and at bedtime.    [provider]    docusate sodium (COLACE) 100 MG capsule Take 100 mg by mouth 2 (two) times daily as needed for mild constipation.    [provider]  Fluticasone-Salmeterol (ADVAIR) 250-50 MCG/DOSE AEPB Inhale 1 puff into the lungs 2 (two) times daily as needed (for seasonal allergy flares).  01/04/16   [provider]  guaiFENesin-codeine (ROBITUSSIN AC) 100-10 MG/5ML syrup Take 5 mLs by mouth 3 (three) times daily as needed for cough. 08/31/18   Henderly, Britni A, PA-C  hydrochlorothiazide (HYDRODIURIL) 25 MG tablet Take 25 mg by mouth daily. 11/02/16   [provider]  naproxen (NAPROSYN) 375 MG tablet Take 1 tablet twice daily as needed for toe pain. 10/15/18   Tamlyn Sides, MD  olmesartan-hydrochlorothiazide (BENICAR HCT) 40-25 MG tablet Take 1 tablet by mouth daily.    [provider]  oxyCODONE (OXY IR/ROXICODONE) 5 MG immediate release tablet TK 1 T PO QID P FOR 7 DAYS 10/23/18   [provider]  QUEtiapine (SEROQUEL) 100 MG tablet TK 1 T PO AT BEDTIME 09/28/18   [provider]  traZODone (DESYREL) 100 MG tablet Take 100 mg by mouth at bedtime as needed for sleep.  11/02/16   [provider]  vitamin B-12 (CYANOCOBALAMIN) 1000 MCG tablet Take 1,000 mcg by mouth daily.    [provider]    Allergies Penicillins   REVIEW OF SYSTEMS  Negative except as noted here or in the History of Present Illness.   PHYSICAL EXAMINATION  Initial Vital Signs Blood pressure 132/90, pulse 98, temperature 99.2 F (37.3 C), temperature source Oral, resp. rate 18, height 6' (1.829 m), weight 77.1 kg, SpO2 98 %.  Examination General: Well-developed, well-nourished female in no acute distress; appearance consistent with age of record HENT: normocephalic; atraumatic Eyes: pupils equal, round and reactive to light; extraocular muscles intact; mucopurulent discharge, right greater than left Neck: supple Heart: regular rate and rhythm Lungs: clear to  auscultation bilaterally Abdomen: soft; nondistended; nontender; bowel sounds present Extremities: No deformity; full range of motion; pulses normal; edema of lower legs, left greater than right without erythema or warmth Neurologic: Awake, alert and oriented; motor function intact in all extremities and symmetric; no facial droop Skin: Warm and dry Psychiatric: Normal mood and affect   RESULTS  Summary of this visit's results, reviewed by myself:   EKG Interpretation  Date/Time:  Thursday January 21 2019 22:16:09 EDT Ventricular Rate:  91 PR Interval:    QRS Duration: 76 QT Interval:  374 QTC Calculation: 461 R Axis:   76 Text Interpretation:  Sinus rhythm No significant change was found  Confirmed by Shanon Rosser [phone]) on 01/21/2019 10:35:16 PM      Laboratory Studies: Results for orders placed or performed during the hospital encounter of 01/21/19 (from the past 24 hour(s))  Basic metabolic panel     Status: Abnormal   Collection Time: 01/21/19 11:01 PM  Result Value Ref Range   Sodium 140 135 - 145 mmol/L   Potassium 3.5 3.5 - 5.1 mmol/L   Chloride 99 98 - 111 mmol/L   CO2 29 22 - 32 mmol/L   Glucose, Bld 80 70 - 99 mg/dL   BUN 8 6 - 20 mg/dL   Creatinine, Ser 0.73 0.44 - 1.00 mg/dL   Calcium 8.4 (L) 8.9 - 10.3 mg/dL   GFR calc non Af Amer >60 >60 mL/min   GFR calc Af Amer >60 >60 mL/min   Anion gap 12 5 - 15  Troponin I (High Sensitivity)     Status: None   Collection Time: 01/21/19 11:01 PM  Result Value Ref Range   Troponin I (High Sensitivity) 6 <18 ng/L  CBC with Differential/Platelet     Status: Abnormal   Collection Time: 01/21/19 11:01 PM  Result Value Ref Range   WBC 4.7 4.0 - 10.5 K/uL   RBC 3.41 (L) 3.87 - 5.11 MIL/uL   Hemoglobin 13.0 12.0 - 15.0 g/dL   HCT 38.7 36.0 - 46.0 %   MCV 113.5 (H) 80.0 - 100.0 fL   MCH 38.1 (H) 26.0 - 34.0 pg   MCHC 33.6 30.0 - 36.0 g/dL   RDW 18.1 (H) 11.5 - 15.5 %   Platelets 161 150 - 400 K/uL   nRBC 0.0 0.0 - 0.2  %   Neutrophils Relative % 64 %   Neutro Abs 3.0 1.7 - 7.7 K/uL   Lymphocytes Relative 26 %   Lymphs Abs 1.2 0.7 - 4.0 K/uL   Monocytes Relative 9 %   Monocytes Absolute 0.4 0.1 - 1.0 K/uL   Eosinophils Relative 1 %   Eosinophils Absolute 0.0 0.0 - 0.5 K/uL   Basophils Relative 0 %   Basophils Absolute 0.0 0.0 - 0.1 K/uL   WBC Morphology MORPHOLOGY UNREMARKABLE    Smear Review Normal platelet morphology    Immature Granulocytes 0 %   Abs Immature Granulocytes 0.02 0.00 - 0.07 K/uL   Target Cells PRESENT    Stomatocytes PRESENT   D-dimer, quantitative (not at [facility])     Status: None   Collection Time: 01/21/19 11:01 PM  Result Value Ref Range   D-Dimer, Quant 0.38 0.00 - 0.50 ug/mL-FEU  Brain natriuretic peptide     Status: None   Collection Time: 01/21/19 11:01 PM  Result Value Ref Range   B Natriuretic Peptide 61.5 0.0 - 100.0 pg/mL   Imaging Studies: No results found.  ED COURSE and MDM  Nursing notes and initial vitals signs, including pulse oximetry, reviewed.  Vitals:   01/21/19 2200 01/21/19 2201  BP: 132/90   Pulse: 98   Resp: 18   Temp: 99.2 F (37.3 C)   TempSrc: Oral   SpO2: 98%   Weight:  77.1 kg  Height:  6' (1.829 m)   1:02 AM The patient has not provided a urine specimen but insists she needs to leave.  Her d-dimer is within normal limits as well as her BNP.  The cause of her peripheral edema is unclear but may be due to standing more than usual.  Her hallucinations may be a result of sleep deprivation. We will  try her on a short course of Ativan.  Her to her primary care physician.  She was advised to continue her eyedrops.  PROCEDURES    ED DIAGNOSES     ICD-10-CM   1. Leg swelling  M79.89   2. Insomnia due to stress  F51.02        Daisuke Bailey, Jenny Reichmann, MD 01/22/19 [phone]

## 2019-01-21 NOTE — ED Triage Notes (Addendum)
[name] states that she began hallucinating today at 1 and she has never had that problem before. She endorses visual hallucinations and denies auditory hallucinations.  The hallucinations are of people, but are not actually of people, once she gets to the object she realizes it is not a person, but an object. She reports that she has bilateral lower leg swelling worse on the left and a cough, the leg swelling began at 9 pm.  She has had a cough for years. She denies fevers but endorses chills that began today. She was just here this AM with conjunctivits but has only taken the drops once. She also reports left sided collarbone and left sided chest pain. She describes the paina s an ache and intermittent. The pain started today at 1 as well. She endorses a fall yesterday on her steps. She also reports severe fatigue and SOB.

## 2019-01-21 NOTE — ED Provider Notes (Signed)
[place] DE[facility] Provider Note: [name], MD, FACEP  CSN: [id number] MRN: [id number] ARRIVAL: 01/21/19 at [place]: [place] [facility]   HISTORY OF PRESENT ILLNESS  01/21/19 6:27 AM [name] is a 43 y.o. female who complains of drainage from both eyes, but primarily the right, the past 4 days.  It was severe enough to "glue" her right eyelid shut when she woke up this morning.  She rates her discomfort as a 5 out of 10.  She denies blurred vision unless drainage is obstructing her view.  She is not aware of anything that may have triggered this.  She denies systemic symptoms such as fever or chills.   Past Medical History:  Diagnosis Date  . Adnexal cyst 2013   right  . Anemia 2015   Pernicious and iron deficiency. As of 2015/2016 no longer requiring po iron or B12 shots.   . 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 arthroscopy x 2.   . Colitis   . Headache(784.0)    migraines  . Hydradenitis 2001   axillary, multiple surgical I & Ds/excisions.  . Hypertension   . Nausea & vomiting 11/28/2014  . Obesity (BMI 30.0-34.9)   . Pilonidal cyst   . Pityriasis rosea    pt also gives hx of seborrheic dermatitis.     Past Surgical History:  Procedure Laterality Date  . ABDOMINAL HYSTERECTOMY    . CYSTECTOMY     2004 axilla bil  . ESOPHAGOGASTRODUODENOSCOPY N/A 11/30/2014   Procedure: ESOPHAGOGASTRODUODENOSCOPY (EGD);  Surgeon: Jerene Bears, MD;  Location: [facility] ENDOSCOPY;  Service: Endoscopy;  Laterality: N/A;  . FRACTURE SURGERY     rt arm  . GASTRIC BYPASS  2011   gastric sleeve  . KNEE SURGERY  2005   left  . LAPAROSCOPIC CHOLECYSTECTOMY  2002  . MEDIAL PATELLOFEMORAL LIGAMENT REPAIR Left 06/08/2013   Procedure: DIAGNOSTIC OPERATIVE ARTHROSCOPY, OPEN MEDIAL PATELLA FEMORAL LIGAMENT RECONSTRUCTION;  Surgeon: Meredith Pel, MD;  Location: [place];  Service: Orthopedics;  Laterality: Left;  with Hamstring autograft  . OVARIAN CYST REMOVAL     . PARTIAL HYSTERECTOMY    . R arm surgery      Family History  Problem Relation Age of Onset  . Lupus Mother   . Crohn's disease Father   . Cancer Maternal Grandfather        lung    Social History   Tobacco Use  . Smoking status: Current Every Day Smoker    Packs/day: 0.50    Years: 10.00    Pack years: 5.00    Types: Cigarettes  . Smokeless tobacco: Never Used  Substance Use Topics  . Alcohol use: Yes    Comment: occ  . Drug use: No    Prior to Admission medications   Medication Sig Start Date End Date Taking? Authorizing Provider  albuterol (PROVENTIL HFA;VENTOLIN HFA) 108 (90 Base) MCG/ACT inhaler Inhale 1-2 puffs into the lungs every 6 (six) hours as needed for wheezing or shortness of breath. 03/30/18   Couture, Cortni S, PA-C  Aspirin-Acetaminophen-Caffeine (GOODY HEADACHE PO) Take 1 packet by mouth daily as needed (pain).    [provider]  atorvastatin (LIPITOR) 40 MG tablet Take 40 mg by mouth at bedtime. 10/10/16   [provider]  dicyclomine (BENTYL) 10 MG capsule Take 10 mg by mouth 4 (four) times daily -  before meals and at bedtime.    [provider]  docusate  sodium (COLACE) 100 MG capsule Take 100 mg by mouth 2 (two) times daily as needed for mild constipation.    [provider]  Fluticasone-Salmeterol (ADVAIR) 250-50 MCG/DOSE AEPB Inhale 1 puff into the lungs 2 (two) times daily as needed (for seasonal allergy flares).  01/04/16   [provider]  guaiFENesin-codeine (ROBITUSSIN AC) 100-10 MG/5ML syrup Take 5 mLs by mouth 3 (three) times daily as needed for cough. 08/31/18   Henderly, Britni A, PA-C  hydrochlorothiazide (HYDRODIURIL) 25 MG tablet Take 25 mg by mouth daily. 11/02/16   [provider]  naproxen (NAPROSYN) 375 MG tablet Take 1 tablet twice daily as needed for toe pain. 10/15/18   Marlin Brys, MD  olmesartan-hydrochlorothiazide (BENICAR HCT) 40-25 MG tablet Take 1 tablet by mouth daily.     [provider]  traZODone (DESYREL) 100 MG tablet Take 100 mg by mouth at bedtime as needed for sleep.  11/02/16   [provider]  vitamin B-12 (CYANOCOBALAMIN) 1000 MCG tablet Take 1,000 mcg by mouth daily.    [provider]    Allergies Penicillins   REVIEW OF SYSTEMS  Negative except as noted here or in the History of Present Illness.   PHYSICAL EXAMINATION  Initial Vital Signs Blood pressure 108/81, pulse (!) 102, temperature 98.5 F (36.9 C), temperature source Oral, resp. rate 16, height 6' (1.829 m), weight 77.1 kg, SpO2 100 %.  Examination General: Well-developed, well-nourished female in no acute distress; appearance consistent with age of record HENT: normocephalic; atraumatic Eyes: pupils equal, round and reactive to light; extraocular muscles intact; mucopurulent discharge, right greater than left, with mild conjunctival injection but no hypopyon:    Neck: supple Heart: regular rate and rhythm Lungs: clear to auscultation bilaterally Abdomen: soft; nondistended; nontender; bowel sounds present Extremities: No deformity; full range of motion Neurologic: Awake, alert and oriented; motor function intact in all extremities and symmetric; no facial droop Skin: Warm and dry Psychiatric: Normal mood and affect   RESULTS  Summary of this visit's results, reviewed by myself:   EKG Interpretation  Date/Time:    Ventricular Rate:    PR Interval:    QRS Duration:   QT Interval:    QTC Calculation:   R Axis:     Text Interpretation:        Laboratory Studies: No results found for this or any previous visit (from the past 24 hour(s)). Imaging Studies: No results found.  ED COURSE and MDM  Nursing notes and initial vitals signs, including pulse oximetry, reviewed.  Vitals:   01/21/19 0618 01/21/19 0622  BP:  108/81  Pulse:  (!) 102  Resp:  16  Temp:  98.5 F (36.9 C)  TempSrc:  Oral  SpO2:  100%  Weight: 77.1 kg   Height:  6' (1.829 m)    Will treat with Ocuflox topically and refer to ophthalmology if symptoms do not improve.  PROCEDURES    ED DIAGNOSES     ICD-10-CM   1. Acute conjunctivitis of both eyes, unspecified acute conjunctivitis type  H10.33        Siah Kannan, Jenny Reichmann, MD 01/21/19 [phone]

## 2019-01-21 NOTE — ED Notes (Signed)
Pt. Reports today she was seeing things that really was not there.  Pt. Reports she was at [employer] and had her kid with her and the kids did not see what she was seeing.. Pt. Was seeing people  That were not there.

## 2019-01-21 NOTE — ED Notes (Signed)
ED Provider at bedside. 

## 2019-01-22 LAB — D-DIMER, QUANTITATIVE: D-Dimer, Quant: 0.38 ug/mL-FEU (ref 0.00–0.50)

## 2019-01-22 LAB — BRAIN NATRIURETIC PEPTIDE: B Natriuretic Peptide: 61.5 pg/mL (ref 0.0–100.0)

## 2019-01-22 MED ORDER — LORAZEPAM 1 MG PO TABS: ORAL_TABLET | ORAL | 0 refills | Status: DC

## 2019-01-22 NOTE — ED Notes (Signed)
EDP at bedside, wanting to discharge patient now. Provided RX x1 and D/C paperwork, encouraged to follow closely with PCP or return to ED if symptoms worsen. Escorted to curbside via wheelchair, pt refused RN assistance to car.

## 2019-01-22 NOTE — ED Notes (Signed)
Pt called out stating that she needs to go home and feed her 10 and 43 year old children, who have apparently been sitting in the car during this ED visit. Notified patient that we have been waiting on urine sample, which has delayed her care. Pt states she refuses to give urine, states "I don't go like that, I went before I came and I don't go 5 or 6 times a day." notified primary nurse Misty and EDP, who states that patient will have to sign out AMA.

## 2019-05-26 ENCOUNTER — Emergency Department (HOSPITAL_BASED_OUTPATIENT_CLINIC_OR_DEPARTMENT_OTHER)
Admission: EM | Admit: 2019-05-26 | Discharge: 2019-05-26 | Disposition: A | Discharge status: Home / Self Care | Payer: Medicaid Other | Attending: Emergency Medicine | Admitting: Emergency Medicine

## 2019-05-26 ENCOUNTER — Encounter (HOSPITAL_BASED_OUTPATIENT_CLINIC_OR_DEPARTMENT_OTHER): Payer: Self-pay

## 2019-05-26 ENCOUNTER — Other Ambulatory Visit: Payer: Self-pay

## 2019-05-26 DIAGNOSIS — Z5321 Procedure and treatment not carried out due to patient leaving prior to being seen by health care provider: Secondary | ICD-10-CM | POA: Insufficient documentation

## 2019-05-26 DIAGNOSIS — R200 Anesthesia of skin: Secondary | ICD-10-CM | POA: Insufficient documentation

## 2019-05-26 LAB — CBC WITH DIFFERENTIAL/PLATELET
Abs Immature Granulocytes: 0.01 10*3/uL (ref 0.00–0.07)
Basophils Absolute: 0 10*3/uL (ref 0.0–0.1)
Basophils Relative: 0 %
Eosinophils Absolute: 0 10*3/uL (ref 0.0–0.5)
Eosinophils Relative: 0 %
HCT: 40.5 % (ref 36.0–46.0)
Hemoglobin: 13.7 g/dL (ref 12.0–15.0)
Immature Granulocytes: 0 %
Lymphocytes Relative: 34 %
Lymphs Abs: 1.6 10*3/uL (ref 0.7–4.0)
MCH: 36.7 pg — ABNORMAL HIGH (ref 26.0–34.0)
MCHC: 33.8 g/dL (ref 30.0–36.0)
MCV: 108.6 fL — ABNORMAL HIGH (ref 80.0–100.0)
Monocytes Absolute: 0.2 10*3/uL (ref 0.1–1.0)
Monocytes Relative: 4 %
Neutro Abs: 2.9 10*3/uL (ref 1.7–7.7)
Neutrophils Relative %: 62 %
Platelets: 163 10*3/uL (ref 150–400)
RBC: 3.73 MIL/uL — ABNORMAL LOW (ref 3.87–5.11)
RDW: 18.4 % — ABNORMAL HIGH (ref 11.5–15.5)
WBC: 4.8 10*3/uL (ref 4.0–10.5)
nRBC: 0 % (ref 0.0–0.2)

## 2019-05-26 LAB — COMPREHENSIVE METABOLIC PANEL
ALT: 100 U/L — ABNORMAL HIGH (ref 0–44)
AST: 158 U/L — ABNORMAL HIGH (ref 15–41)
Albumin: 2.8 g/dL — ABNORMAL LOW (ref 3.5–5.0)
Alkaline Phosphatase: 319 U/L — ABNORMAL HIGH (ref 38–126)
Anion gap: 11 (ref 5–15)
BUN: 8 mg/dL (ref 6–20)
CO2: 28 mmol/L (ref 22–32)
Calcium: 8.6 mg/dL — ABNORMAL LOW (ref 8.9–10.3)
Chloride: 102 mmol/L (ref 98–111)
Creatinine, Ser: 0.63 mg/dL (ref 0.44–1.00)
GFR calc Af Amer: >60 mL/min (ref >60)
GFR calc non Af Amer: >60 mL/min (ref >60)
Glucose, Bld: 98 mg/dL (ref 70–99)
Potassium: 3.7 mmol/L (ref 3.5–5.1)
Sodium: 141 mmol/L (ref 135–145)
Total Bilirubin: 2.4 mg/dL — ABNORMAL HIGH (ref 0.3–1.2)
Total Protein: 6.4 g/dL — ABNORMAL LOW (ref 6.5–8.1)

## 2019-05-26 NOTE — ED Triage Notes (Addendum)
Pt with c/o bilat LE numbness, vision changes, decreased appetite, weight loss, HA, n/v, multiple falls-sx started in April 2020-to triage in w/c-NAD-stood with 2 steps to scale for weight

## 2019-06-23 ENCOUNTER — Ambulatory Visit: Payer: Self-pay | Admitting: Internal Medicine

## 2019-08-17 DIAGNOSIS — T1491XA Suicide attempt, initial encounter: Secondary | ICD-10-CM | POA: Insufficient documentation

## 2019-08-18 DIAGNOSIS — T50901A Poisoning by unspecified drugs, medicaments and biological substances, accidental (unintentional), initial encounter: Secondary | ICD-10-CM | POA: Insufficient documentation

## 2020-09-03 ENCOUNTER — Emergency Department (HOSPITAL_BASED_OUTPATIENT_CLINIC_OR_DEPARTMENT_OTHER)
Admission: EM | Admit: 2020-09-03 | Discharge: 2020-09-04 | Disposition: A | Discharge status: Home / Self Care | Payer: 59 | Attending: Emergency Medicine | Admitting: Emergency Medicine

## 2020-09-03 ENCOUNTER — Other Ambulatory Visit: Payer: Self-pay

## 2020-09-03 DIAGNOSIS — R030 Elevated blood-pressure reading, without diagnosis of hypertension: Secondary | ICD-10-CM | POA: Diagnosis not present

## 2020-09-03 DIAGNOSIS — Z7982 Long term (current) use of aspirin: Secondary | ICD-10-CM | POA: Insufficient documentation

## 2020-09-03 DIAGNOSIS — F17210 Nicotine dependence, cigarettes, uncomplicated: Secondary | ICD-10-CM | POA: Insufficient documentation

## 2020-09-03 DIAGNOSIS — I10 Essential (primary) hypertension: Secondary | ICD-10-CM

## 2020-09-03 DIAGNOSIS — L729 Follicular cyst of the skin and subcutaneous tissue, unspecified: Secondary | ICD-10-CM

## 2020-09-03 DIAGNOSIS — L0231 Cutaneous abscess of buttock: Secondary | ICD-10-CM | POA: Diagnosis not present

## 2020-09-03 DIAGNOSIS — R69 Illness, unspecified: Secondary | ICD-10-CM | POA: Diagnosis not present

## 2020-09-03 DIAGNOSIS — Z79899 Other long term (current) drug therapy: Secondary | ICD-10-CM | POA: Insufficient documentation

## 2020-09-03 NOTE — ED Notes (Signed)
AMA process explained and MSE waiver signed by patient. 

## 2020-09-03 NOTE — ED Triage Notes (Signed)
Recurrent cyst to left buttock- Sx x 1 week

## 2020-09-03 NOTE — ED Provider Notes (Incomplete)
[facility] EMERGENCY DEPARTMENT Provider Note   CSN: [id number] Arrival date & time: 09/03/20  2018    History Chief Complaint  Patient presents with  . Cyst    [name] is a 45 y.o. female.  The history is provided by the patient.    Past Medical History:  Diagnosis Date  . Adnexal cyst 2013   right  . Anemia 2015   Pernicious and iron deficiency. As of 2015/2016 no longer requiring po iron or B12 shots.   . 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 arthroscopy x 2.   . Colitis   . Headache(784.0)    migraines  . Hydradenitis 2001   axillary, multiple surgical I & Ds/excisions.  . Hypertension   . Nausea & vomiting 11/28/2014  . Obesity (BMI 30.0-34.9)   . Pilonidal cyst   . Pityriasis rosea    pt also gives hx of seborrheic dermatitis.     Patient Active Problem List   Diagnosis Date Noted  . Infectious colitis 11/21/2015  . Chronic low back pain 11/21/2015  . Chronic asthmatic bronchitis ([place]) 11/21/2015  . Bacterial vaginosis 11/21/2015  . Elevated lipase 12/14/2014  . AP (abdominal pain) 12/14/2014  . Nausea with vomiting 12/14/2014  . Abdominal pain, epigastric   . Nausea and vomiting 11/29/2014  . Intractable abdominal pain 11/28/2014  . Hypokalemia 11/28/2014  . HTN (hypertension) 11/28/2014  . Pilonidal abscess 12/03/2013  . Patellar instability of left knee 06/08/2013    Past Surgical History:  Procedure Laterality Date  . ABDOMINAL HYSTERECTOMY    . CYSTECTOMY     2004 axilla bil  . ESOPHAGOGASTRODUODENOSCOPY N/A 11/30/2014   Procedure: ESOPHAGOGASTRODUODENOSCOPY (EGD);  Surgeon: Jerene Bears, MD;  Location: [facility] ENDOSCOPY;  Service: Endoscopy;  Laterality: N/A;  . FRACTURE SURGERY     rt arm  . GASTRIC BYPASS  2011   gastric sleeve  . KNEE SURGERY  2005   left  . LAPAROSCOPIC CHOLECYSTECTOMY  2002  . MEDIAL PATELLOFEMORAL LIGAMENT REPAIR Left 06/08/2013   Procedure: DIAGNOSTIC OPERATIVE ARTHROSCOPY, OPEN MEDIAL PATELLA FEMORAL  LIGAMENT RECONSTRUCTION;  Surgeon: Meredith Pel, MD;  Location: [facility];  Service: Orthopedics;  Laterality: Left;  with Hamstring autograft  . OVARIAN CYST REMOVAL    . PARTIAL HYSTERECTOMY    . R arm surgery       OB History   No obstetric history on file.     Family History  Problem Relation Age of Onset  . Lupus Mother   . Crohn's disease Father   . Cancer Maternal Grandfather        lung    Social History   Tobacco Use  . Smoking status: Current Every Day Smoker    Packs/day: 0.50    Years: 10.00    Pack years: 5.00    Types: Cigarettes  . Smokeless tobacco: Never Used  Vaping Use  . Vaping Use: Never used  Substance Use Topics  . Alcohol use: Yes    Comment: occ  . Drug use: No    Home Medications Prior to Admission medications   Medication Sig Start Date End Date Taking? Authorizing Provider  albuterol (PROVENTIL HFA;VENTOLIN HFA) 108 (90 Base) MCG/ACT inhaler Inhale 1-2 puffs into the lungs every 6 (six) hours as needed for wheezing or shortness of breath. 03/30/18   Couture, Cortni S, PA-C  ALPRAZolam (XANAX) 0.25 MG tablet Take 0.25 mg by mouth 4 (four) times daily. 09/28/18   [provider]  Aspirin-Acetaminophen-Caffeine (  GOODY HEADACHE PO) Take 1 packet by mouth daily as needed (pain).    [provider]  atorvastatin (LIPITOR) 40 MG tablet Take 40 mg by mouth at bedtime. 10/10/16   [provider]  dicyclomine (BENTYL) 10 MG capsule Take 10 mg by mouth 4 (four) times daily -  before meals and at bedtime.    [provider]  docusate sodium (COLACE) 100 MG capsule Take 100 mg by mouth 2 (two) times daily as needed for mild constipation.    [provider]  Fluticasone-Salmeterol (ADVAIR) 250-50 MCG/DOSE AEPB Inhale 1 puff into the lungs 2 (two) times daily as needed (for seasonal allergy flares).  01/04/16   [provider]  guaiFENesin-codeine (ROBITUSSIN AC) 100-10 MG/5ML syrup Take 5 mLs by mouth 3  (three) times daily as needed for cough. 08/31/18   Henderly, Britni A, PA-C  hydrochlorothiazide (HYDRODIURIL) 25 MG tablet Take 25 mg by mouth daily. 11/02/16   [provider]  LORazepam (ATIVAN) 1 MG tablet Take 1 tablet at bedtime as needed for sleep. 01/22/19   Molpus, John, MD  naproxen (NAPROSYN) 375 MG tablet Take 1 tablet twice daily as needed for toe pain. 10/15/18   Molpus, John, MD  olmesartan-hydrochlorothiazide (BENICAR HCT) 40-25 MG tablet Take 1 tablet by mouth daily.    [provider]  oxyCODONE (OXY IR/ROXICODONE) 5 MG immediate release tablet TK 1 T PO QID P FOR 7 DAYS 10/23/18   [provider]  QUEtiapine (SEROQUEL) 100 MG tablet TK 1 T PO AT BEDTIME 09/28/18   [provider]  traZODone (DESYREL) 100 MG tablet Take 100 mg by mouth at bedtime as needed for sleep.  11/02/16   [provider]  vitamin B-12 (CYANOCOBALAMIN) 1000 MCG tablet Take 1,000 mcg by mouth daily.    [provider]    Allergies    Penicillins  Review of Systems   Review of Systems  All other systems reviewed and are negative.   Physical Exam Updated Vital Signs BP (!) 188/115 (BP Location: Left Arm)   Pulse 88   Temp 98.7 F (37.1 C) (Oral)   Resp 18   Ht 6' (1.829 m)   Wt 72.6 kg   SpO2 99%   BMI 21.70 kg/m   Physical Exam Vitals and nursing note reviewed.   *** year old ***female, resting comfortably and in no acute distress. Vital signs are ***. Oxygen saturation is ***%, which is normal. Head is normocephalic and atraumatic. PERRLA, EOMI. Oropharynx is clear. Neck is nontender and supple without adenopathy or JVD. Back is nontender and there is no CVA tenderness. Lungs are clear without rales, wheezes, or rhonchi. Chest is nontender. Heart has regular rate and rhythm without murmur. Abdomen is soft, flat, nontender without masses or hepatosplenomegaly and peristalsis is normoactive. Extremities have no cyanosis or edema, full range of  motion is present. Skin is warm and dry without rash. Neurologic: Mental status is normal, cranial nerves are intact, there are no motor or sensory deficits.   ED Results / Procedures / Treatments   Labs (all labs ordered are listed, but only abnormal results are displayed) Labs Reviewed - No data to display  EKG None  Radiology No results found.  Procedures Procedures {Remember to document critical care time when appropriate:1}  Medications Ordered in ED Medications - No data to display  ED Course  I have reviewed the triage vital signs and the nursing notes.  Pertinent labs & imaging results that were  available during my care of the patient were reviewed by me and considered in my medical decision making (see chart for details).    MDM Rules/Calculators/A&P                          *** Final Clinical Impression(s) / ED Diagnoses Final diagnoses:  None    Rx / DC Orders ED Discharge Orders    None

## 2020-09-03 NOTE — ED Provider Notes (Addendum)
[facility] EMERGENCY DEPARTMENT Provider Note   CSN: [id number] Arrival date & time: 09/03/20  2018    History Chief Complaint  Patient presents with  . Cyst    [name] is a 45 y.o. female.  The history is provided by the patient.  She has history of hypertension, chronic back pain and comes in because of a cyst on her left buttock.  She noticed it about a week ago.  It had gotten larger, but now has shrunk down.  She continues to have pain in the location.  She has history of having had multiple cysts which have required drainage and/or excision.  She denies fever or chills.  Past Medical History:  Diagnosis Date  . Adnexal cyst 2013   right  . Anemia 2015   Pernicious and iron deficiency. As of 2015/2016 no longer requiring po iron or B12 shots.   . 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 arthroscopy x 2.   . Colitis   . Headache(784.0)    migraines  . Hydradenitis 2001   axillary, multiple surgical I & Ds/excisions.  . Hypertension   . Nausea & vomiting 11/28/2014  . Obesity (BMI 30.0-34.9)   . Pilonidal cyst   . Pityriasis rosea    pt also gives hx of seborrheic dermatitis.     Patient Active Problem List   Diagnosis Date Noted  . Infectious colitis 11/21/2015  . Chronic low back pain 11/21/2015  . Chronic asthmatic bronchitis ([place]) 11/21/2015  . Bacterial vaginosis 11/21/2015  . Elevated lipase 12/14/2014  . AP (abdominal pain) 12/14/2014  . Nausea with vomiting 12/14/2014  . Abdominal pain, epigastric   . Nausea and vomiting 11/29/2014  . Intractable abdominal pain 11/28/2014  . Hypokalemia 11/28/2014  . HTN (hypertension) 11/28/2014  . Pilonidal abscess 12/03/2013  . Patellar instability of left knee 06/08/2013    Past Surgical History:  Procedure Laterality Date  . ABDOMINAL HYSTERECTOMY    . CYSTECTOMY     2004 axilla bil  . ESOPHAGOGASTRODUODENOSCOPY N/A 11/30/2014   Procedure: ESOPHAGOGASTRODUODENOSCOPY (EGD);  Surgeon: Jerene Bears, MD;  Location: [facility] ENDOSCOPY;  Service: Endoscopy;  Laterality: N/A;  . FRACTURE SURGERY     rt arm  . GASTRIC BYPASS  2011   gastric sleeve  . KNEE SURGERY  2005   left  . LAPAROSCOPIC CHOLECYSTECTOMY  2002  . MEDIAL PATELLOFEMORAL LIGAMENT REPAIR Left 06/08/2013   Procedure: DIAGNOSTIC OPERATIVE ARTHROSCOPY, OPEN MEDIAL PATELLA FEMORAL LIGAMENT RECONSTRUCTION;  Surgeon: Meredith Pel, MD;  Location: [facility];  Service: Orthopedics;  Laterality: Left;  with Hamstring autograft  . OVARIAN CYST REMOVAL    . PARTIAL HYSTERECTOMY    . R arm surgery       OB History   No obstetric history on file.     Family History  Problem Relation Age of Onset  . Lupus Mother   . Crohn's disease Father   . Cancer Maternal Grandfather        lung    Social History   Tobacco Use  . Smoking status: Current Every Day Smoker    Packs/day: 0.50    Years: 10.00    Pack years: 5.00    Types: Cigarettes  . Smokeless tobacco: Never Used  Vaping Use  . Vaping Use: Never used  Substance Use Topics  . Alcohol use: Yes    Comment: occ  . Drug use: No    Home Medications Prior to Admission medications   Medication  Sig Start Date End Date Taking? Authorizing Provider  albuterol (PROVENTIL HFA;VENTOLIN HFA) 108 (90 Base) MCG/ACT inhaler Inhale 1-2 puffs into the lungs every 6 (six) hours as needed for wheezing or shortness of breath. 03/30/18   Couture, Cortni S, PA-C  ALPRAZolam (XANAX) 0.25 MG tablet Take 0.25 mg by mouth 4 (four) times daily. 09/28/18   [provider]  Aspirin-Acetaminophen-Caffeine (GOODY HEADACHE PO) Take 1 packet by mouth daily as needed (pain).    [provider]  atorvastatin (LIPITOR) 40 MG tablet Take 40 mg by mouth at bedtime. 10/10/16   [provider]  dicyclomine (BENTYL) 10 MG capsule Take 10 mg by mouth 4 (four) times daily -  before meals and at bedtime.    [provider]  docusate sodium (COLACE) 100 MG capsule Take 100  mg by mouth 2 (two) times daily as needed for mild constipation.    [provider]  Fluticasone-Salmeterol (ADVAIR) 250-50 MCG/DOSE AEPB Inhale 1 puff into the lungs 2 (two) times daily as needed (for seasonal allergy flares).  01/04/16   [provider]  guaiFENesin-codeine (ROBITUSSIN AC) 100-10 MG/5ML syrup Take 5 mLs by mouth 3 (three) times daily as needed for cough. 08/31/18   Henderly, Britni A, PA-C  hydrochlorothiazide (HYDRODIURIL) 25 MG tablet Take 25 mg by mouth daily. 11/02/16   [provider]  LORazepam (ATIVAN) 1 MG tablet Take 1 tablet at bedtime as needed for sleep. 01/22/19   Molpus, John, MD  naproxen (NAPROSYN) 375 MG tablet Take 1 tablet twice daily as needed for toe pain. 10/15/18   Molpus, John, MD  olmesartan-hydrochlorothiazide (BENICAR HCT) 40-25 MG tablet Take 1 tablet by mouth daily.    [provider]  oxyCODONE (OXY IR/ROXICODONE) 5 MG immediate release tablet TK 1 T PO QID P FOR 7 DAYS 10/23/18   [provider]  QUEtiapine (SEROQUEL) 100 MG tablet TK 1 T PO AT BEDTIME 09/28/18   [provider]  traZODone (DESYREL) 100 MG tablet Take 100 mg by mouth at bedtime as needed for sleep.  11/02/16   [provider]  vitamin B-12 (CYANOCOBALAMIN) 1000 MCG tablet Take 1,000 mcg by mouth daily.    [provider]    Allergies    Penicillins  Review of Systems   Review of Systems  All other systems reviewed and are negative.   Physical Exam Updated Vital Signs BP (!) 188/115 (BP Location: Left Arm)   Pulse 88   Temp 98.7 F (37.1 C) (Oral)   Resp 18   Ht 6' (1.829 m)   Wt 72.6 kg   SpO2 99%   BMI 21.70 kg/m   Physical Exam Vitals and nursing note reviewed.   45 year old female, resting comfortably and in no acute distress. Vital signs are significant for elevated blood pressure. Oxygen saturation is 99%, which is normal. Head is normocephalic and atraumatic. PERRLA, EOMI. Oropharynx is  clear. Neck is nontender and supple without adenopathy or JVD. Back is nontender and there is no CVA tenderness. Lungs are clear without rales, wheezes, or rhonchi. Chest is nontender. Heart has regular rate and rhythm without murmur. Abdomen is soft, flat, nontender without masses or hepatosplenomegaly and peristalsis is normoactive. Extremities have no cyanosis or edema, full range of motion is present. Skin: Tender nodule present in the inferior aspect of the left buttock.  This is freely movable and not fluctuant.  There is no overlying erythema of the skin. Neurologic: Mental status is normal, cranial  nerves are intact, there are no motor or sensory deficits.   ED Results / Procedures / Treatments    Procedures Ultrasound ED Soft Tissue  Date/Time: 09/04/2020 12:39 AM Performed by: Delora Fuel, MD Authorized by: Delora Fuel, MD   Procedure details:    Indications: localization of abscess     Transverse view:  Visualized   Longitudinal view:  Visualized   Images: archived   Location:    Location: buttocks     Side:  Left Findings:     no abscess present    no cellulitis present     Medications Ordered in ED Medications  doxycycline (VIBRA-TABS) tablet 100 mg (has no administration in time range)    ED Course  I have reviewed the triage vital signs and the nursing notes.  MDM Rules/Calculators/A&P Cyst of the left buttock.  Point-of-care ultrasound done showing no drainable abscess.  Patient is advised of this finding, given prescription for doxycycline and advised to continue using warm compresses.  Old records reviewed showing she is getting regular prescriptions of oxycodone for back pain.  Advised that she may supplement this with ibuprofen and/or acetaminophen.  She also has prior ED visits for abscesses.  Return precautions discussed.  Final Clinical Impression(s) / ED Diagnoses Final diagnoses:  Cyst of buttocks  Elevated blood pressure reading with  diagnosis of hypertension    Rx / DC Orders ED Discharge Orders    None       Delora Fuel, MD 22/48/25 0037    Delora Fuel, MD 04/88/89 [phone]

## 2020-09-04 MED ORDER — DOXYCYCLINE HYCLATE 100 MG PO CAPS: 100.0000 mg | ORAL_CAPSULE | Freq: Two times a day (BID) | ORAL | 0 refills | Status: DC

## 2020-09-04 MED ORDER — DOXYCYCLINE HYCLATE 100 MG PO TABS
100.0000 mg | ORAL_TABLET | Freq: Once | ORAL | Status: AC
  Administered 2020-09-04: 100 mg via ORAL
  Filled 2020-09-04: qty 1

## 2020-09-04 NOTE — Discharge Instructions (Signed)
This is that you have is too small to do a drainage procedure on.  Continue using warm compresses.  You may take ibuprofen and/or acetaminophen to get additional pain relief.  Return if the cyst seems to be getting larger.

## 2020-09-10 ENCOUNTER — Telehealth: Payer: 59 | Admitting: Family

## 2020-09-10 DIAGNOSIS — L729 Follicular cyst of the skin and subcutaneous tissue, unspecified: Secondary | ICD-10-CM | POA: Diagnosis not present

## 2020-09-10 DIAGNOSIS — M549 Dorsalgia, unspecified: Secondary | ICD-10-CM

## 2020-09-10 MED ORDER — NAPROXEN 500 MG PO TABS
500.0000 mg | ORAL_TABLET | Freq: Two times a day (BID) | ORAL | 0 refills | Status: DC
Start: 2020-09-10 — End: 2020-10-05

## 2020-09-10 MED ORDER — BACLOFEN 10 MG PO TABS: 10.0000 mg | ORAL_TABLET | Freq: Three times a day (TID) | ORAL | 0 refills | Status: DC

## 2020-09-10 NOTE — Progress Notes (Signed)

## 2020-10-05 ENCOUNTER — Ambulatory Visit (INDEPENDENT_AMBULATORY_CARE_PROVIDER_SITE_OTHER): Payer: 59 | Admitting: Family Medicine

## 2020-10-05 ENCOUNTER — Other Ambulatory Visit: Payer: Self-pay

## 2020-10-05 ENCOUNTER — Encounter: Payer: Self-pay | Admitting: Family Medicine

## 2020-10-05 VITALS — BP 156/92 | HR 108 | Temp 97.7°F | Ht 71.0 in | Wt 171.4 lb

## 2020-10-05 DIAGNOSIS — R5382 Chronic fatigue, unspecified: Secondary | ICD-10-CM

## 2020-10-05 DIAGNOSIS — I10 Essential (primary) hypertension: Secondary | ICD-10-CM | POA: Diagnosis not present

## 2020-10-05 DIAGNOSIS — D649 Anemia, unspecified: Secondary | ICD-10-CM

## 2020-10-05 DIAGNOSIS — G6289 Other specified polyneuropathies: Secondary | ICD-10-CM

## 2020-10-05 DIAGNOSIS — Z903 Acquired absence of stomach [part of]: Secondary | ICD-10-CM

## 2020-10-05 DIAGNOSIS — F411 Generalized anxiety disorder: Secondary | ICD-10-CM

## 2020-10-05 DIAGNOSIS — K580 Irritable bowel syndrome with diarrhea: Secondary | ICD-10-CM

## 2020-10-05 DIAGNOSIS — R69 Illness, unspecified: Secondary | ICD-10-CM | POA: Diagnosis not present

## 2020-10-05 MED ORDER — DICYCLOMINE HCL 10 MG PO CAPS: 10.0000 mg | ORAL_CAPSULE | Freq: Three times a day (TID) | ORAL | 1 refills | Status: DC

## 2020-10-05 MED ORDER — TRAZODONE HCL 100 MG PO TABS: 100.0000 mg | ORAL_TABLET | Freq: Every evening | ORAL | 1 refills | Status: DC | PRN

## 2020-10-05 MED ORDER — CYCLOBENZAPRINE HCL 10 MG PO TABS: 10.0000 mg | ORAL_TABLET | Freq: Three times a day (TID) | ORAL | 1 refills | Status: DC | PRN

## 2020-10-05 MED ORDER — GABAPENTIN 300 MG PO CAPS: 300.0000 mg | ORAL_CAPSULE | Freq: Three times a day (TID) | ORAL | 3 refills | Status: DC

## 2020-10-06 LAB — IRON,TIBC AND FERRITIN PANEL
%SAT: 13 % (calc) — ABNORMAL LOW (ref 16–45)
Ferritin: 76 ng/mL (ref 16–232)
Iron: 42 ug/dL (ref 40–190)
TIBC: 334 mcg/dL (calc) (ref 250–450)

## 2020-10-06 LAB — COMPLETE METABOLIC PANEL WITH GFR
AG Ratio: 1.4 (calc) (ref 1.0–2.5)
ALT: 63 U/L — ABNORMAL HIGH (ref 6–29)
AST: 43 U/L — ABNORMAL HIGH (ref 10–35)
Albumin: 3.9 g/dL (ref 3.6–5.1)
Alkaline phosphatase (APISO): 361 U/L — ABNORMAL HIGH (ref 31–125)
BUN: 13 mg/dL (ref 7–25)
CO2: 25 mmol/L (ref 20–32)
Calcium: 9 mg/dL (ref 8.6–10.2)
Chloride: 109 mmol/L (ref 98–110)
Creat: 0.66 mg/dL (ref 0.50–1.10)
GFR, Est African American: 124 mL/min/{1.73_m2} (ref >=60)
GFR, Est Non African American: 107 mL/min/{1.73_m2} (ref >=60)
Globulin: 2.7 g/dL (calc) (ref 1.9–3.7)
Glucose, Bld: 106 mg/dL — ABNORMAL HIGH (ref 65–99)
Potassium: 4.5 mmol/L (ref 3.5–5.3)
Sodium: 142 mmol/L (ref 135–146)
Total Bilirubin: 0.4 mg/dL (ref 0.2–1.2)
Total Protein: 6.6 g/dL (ref 6.1–8.1)

## 2020-10-06 LAB — CBC WITH DIFFERENTIAL/PLATELET
Absolute Monocytes: 319 cells/uL (ref 200–950)
Basophils Absolute: 41 cells/uL (ref 0–200)
Basophils Relative: 0.7 %
Eosinophils Absolute: 81 cells/uL (ref 15–500)
Eosinophils Relative: 1.4 %
HCT: 44.2 % (ref 35.0–45.0)
Hemoglobin: 14.4 g/dL (ref 11.7–15.5)
Lymphs Abs: 2250 cells/uL (ref 850–3900)
MCH: 34.2 pg — ABNORMAL HIGH (ref 27.0–33.0)
MCHC: 32.6 g/dL (ref 32.0–36.0)
MCV: 105 fL — ABNORMAL HIGH (ref 80.0–100.0)
MPV: 10.6 fL (ref 7.5–12.5)
Monocytes Relative: 5.5 %
Neutro Abs: 3109 cells/uL (ref 1500–7800)
Neutrophils Relative %: 53.6 %
Platelets: 225 10*3/uL (ref 140–400)
RBC: 4.21 10*6/uL (ref 3.80–5.10)
RDW: 14.2 % (ref 11.0–15.0)
Total Lymphocyte: 38.8 %
WBC: 5.8 10*3/uL (ref 3.8–10.8)

## 2020-10-06 LAB — HEMOGLOBIN A1C
Hgb A1c MFr Bld: 4.5 % of total Hgb (ref <5.7)
Mean Plasma Glucose: 82 mg/dL
eAG (mmol/L): 4.6 mmol/L

## 2020-10-06 LAB — VITAMIN D 25 HYDROXY (VIT D DEFICIENCY, FRACTURES): Vit D, 25-Hydroxy: 12 ng/mL — ABNORMAL LOW (ref 30–100)

## 2020-10-06 LAB — TSH+FREE T4: TSH W/REFLEX TO FT4: 1.24 mIU/L

## 2020-10-06 LAB — VITAMIN B12: Vitamin B-12: 440 pg/mL (ref 200–1100)

## 2020-10-09 ENCOUNTER — Ambulatory Visit: Payer: 59 | Admitting: Family Medicine

## 2020-10-10 ENCOUNTER — Ambulatory Visit: Payer: 59 | Admitting: Family Medicine

## 2020-10-11 ENCOUNTER — Encounter: Payer: Self-pay | Admitting: Family Medicine

## 2020-10-11 DIAGNOSIS — G629 Polyneuropathy, unspecified: Secondary | ICD-10-CM | POA: Insufficient documentation

## 2020-10-11 NOTE — Assessment & Plan Note (Signed)
Recommend increased fiber diet.  Dicyclomine refilled.  I think she would benefit from accomodation of increased bathroom breaks and consideration for remote work due to her IBS and anxiety.

## 2020-10-11 NOTE — Assessment & Plan Note (Signed)
BP elevated today.  Currently not taking anything for HTN.  Recommend low sodium diet. Anxiety may be contributing.  Return in about 4 weeks (around 11/02/2020) for HTN/IBS/Neuropathy.

## 2020-10-11 NOTE — Progress Notes (Signed)
[name] - 45 y.o. female MRN [id number]  Date of birth: 1976-01-15  Subjective Chief Complaint  Patient presents with  . Establish Care    HPI [name] is a 45 y.o. female here today for initial visit to establish care.   She has a history of HTN, recurrent gluteal/pilonial abscess, IBS, Anxiety and neuropathy.   She has history of neuropathy with burning sensation in her feet.  She does have history of B12 deficiency related to previous weight loss surgery.  No history of diabetes. She has not taken anything for neuropathy.    She has IBS that worsens with anxiety.  IBS is mostly diarrhea predominant.  She sometimes has to go several times per day.  She did recently start a new job.  She is limited in her ability to use the restroom due to current responsibilities at her job.  Her anxiety is also worsened in social setting at work.  Her employer will allow her to work remotely but she needs a note stating she has condition warranting this.  In regards to treatment for her IBS she has had good results with bentyl in the past.  IB gard has also worked well for her but this is too expensive for continued use.  She has had colonoscopy previously.  She denies blood in her stool.   She has had difficulty with sleep due to anxiety.  She has tried OTC treatments but hasn't had much improvement with this.    ROS:  A comprehensive ROS was completed and negative except as noted per HPI  Allergies  Allergen Reactions  . Penicillins Anaphylaxis, Swelling and Rash    Has patient had a PCN reaction causing immediate rash, facial/tongue/throat swelling, SOB or lightheadedness with hypotension: Yes Has patient had a PCN reaction causing severe rash involving mucus membranes or skin necrosis: No Has patient had a PCN reaction that required hospitalization No Has patient had a PCN reaction occurring within the last 10 years: No If all of the above answers are "NO", then may proceed with  Cephalosporin use.  Face and throat swell    Past Medical History:  Diagnosis Date  . Adnexal cyst 2013   right  . Anemia 2015   Pernicious and iron deficiency. As of 2015/2016 no longer requiring po iron or B12 shots.   . 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 arthroscopy x 2.   . Colitis   . Headache(784.0)    migraines  . Hydradenitis 2001   axillary, multiple surgical I & Ds/excisions.  . Hypertension   . Nausea & vomiting 11/28/2014  . Obesity (BMI 30.0-34.9)   . Pilonidal cyst   . Pityriasis rosea    pt also gives hx of seborrheic dermatitis.     Past Surgical History:  Procedure Laterality Date  . ABDOMINAL HYSTERECTOMY    . CYSTECTOMY     2004 axilla bil  . ESOPHAGOGASTRODUODENOSCOPY N/A 11/30/2014   Procedure: ESOPHAGOGASTRODUODENOSCOPY (EGD);  Surgeon: Jerene Bears, MD;  Location: [facility] ENDOSCOPY;  Service: Endoscopy;  Laterality: N/A;  . FRACTURE SURGERY     rt arm  . GASTRIC BYPASS  2011   gastric sleeve  . KNEE SURGERY  2005   left  . LAPAROSCOPIC CHOLECYSTECTOMY  2002  . MEDIAL PATELLOFEMORAL LIGAMENT REPAIR Left 06/08/2013   Procedure: DIAGNOSTIC OPERATIVE ARTHROSCOPY, OPEN MEDIAL PATELLA FEMORAL LIGAMENT RECONSTRUCTION;  Surgeon: Meredith Pel, MD;  Location: [place];  Service: Orthopedics;  Laterality: Left;  with Hamstring autograft  .  OVARIAN CYST REMOVAL    . PARTIAL HYSTERECTOMY    . R arm surgery      Social History   Socioeconomic History  . Marital status: Single    Spouse name: Not on file  . Number of children: Not on file  . Years of education: Not on file  . Highest education level: Not on file  Occupational History  . 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   Tobacco Use  . Smoking status: Current Every Day Smoker    Packs/day: 0.50    Years: 10.00    Pack years: 5.00    Types: Cigarettes    Start date: 05/20/2013  . Smokeless tobacco: Never Used  Vaping Use   . Vaping Use: Never used  Substance and Sexual Activity  . Alcohol use: Yes    Alcohol/week: 1.0 - 2.0 standard drink    Types: 1 - 2 Standard drinks or equivalent per week    Comment: occ  . Drug use: Yes    Types: Other-see comments    Comment: CBD rub  . Sexual activity: Yes    Partners: Male    Birth control/protection: Surgical  Other Topics Concern  . Not on file  Social History Narrative  . Not on file   Social Determinants of Health   Financial Resource Strain: Not on file  Food Insecurity: Not on file  Transportation Needs: Not on file  Physical Activity: Not on file  Stress: Not on file  Social Connections: Not on file    Family History  Problem Relation Age of Onset  . Lupus Mother   . Stroke Mother   . Hypertension Mother   . Crohn's disease Father   . Hypertension Father   . Gallbladder disease Father   . Cancer Maternal Grandfather        lung  . COPD Maternal Grandfather   . Emphysema Maternal Grandfather   . Lung cancer Maternal Grandmother   . Stroke Maternal Grandmother   . Coronary artery disease Paternal Grandfather     Health Maintenance  Topic Date Due  . Hepatitis C Screening  Never done  . PAP SMEAR-Modifier  Never done  . COLONOSCOPY (Pts 45-14yrs Insurance coverage will need to be confirmed)  Never done  . COVID-19 Vaccine (3 - Booster for Moderna series) 10/04/2020  . INFLUENZA VACCINE  12/18/2020  . TETANUS/TDAP  09/05/2024  . HIV Screening  Completed  . HPV VACCINES  Aged Out     ----------------------------------------------------------------------------------------------------------------------------------------------------------------------------------------------------------------- Physical Exam BP (!) 156/92 (BP Location: Left Arm, Patient Position: Sitting, Cuff Size: Normal)   Pulse (!) 108   Temp 97.7 F (36.5 C)   Ht 5\' 11"  (1.803 m)   Wt 171 lb 6.4 oz (77.7 kg)   SpO2 97%   BMI 23.91 kg/m   Physical  Exam Constitutional:      Appearance: Normal appearance.  HENT:     Head: Normocephalic and atraumatic.  Eyes:     General: No scleral icterus. Cardiovascular:     Rate and Rhythm: Normal rate and regular rhythm.  Pulmonary:     Effort: Pulmonary effort is normal.     Breath sounds: Normal breath sounds.  Abdominal:     General: Abdomen is flat. There is no distension.     Palpations: Abdomen is soft.     Tenderness: There is no abdominal tenderness.  Musculoskeletal:     Cervical  back: Neck supple.  Skin:    General: Skin is warm and dry.  Neurological:     General: No focal deficit present.     Mental Status: She is alert.  Psychiatric:        Mood and Affect: Mood normal.        Behavior: Behavior normal.     ------------------------------------------------------------------------------------------------------------------------------------------------------------------------------------------------------------------- Assessment and Plan  Benign essential hypertension BP elevated today.  Currently not taking anything for HTN.  Recommend low sodium diet. Anxiety may be contributing.  Return in about 4 weeks (around 11/02/2020) for HTN/IBS/Neuropathy.   Irritable bowel syndrome with diarrhea Recommend increased fiber diet.  Dicyclomine refilled.  I think she would benefit from accomodation of increased bathroom breaks and consideration for remote work due to her IBS and anxiety.    Generalized anxiety disorder No SI at this time.  Adding trazodone on for associated insomnia.   Chronic fatigue History of weight loss surgery (gastric sleeve).  Checking b12, iron panel and vitamin d in addition to CMP, CBC and TSH  Peripheral neuropathy Trial of gabapentin.  Checking TSH, glucose and B12 levels.    Meds ordered this encounter  Medications  . dicyclomine (BENTYL) 10 MG capsule    Sig: Take 1 capsule (10 mg total) by mouth 4 (four) times daily -  before meals and  at bedtime.    Dispense:  90 capsule    Refill:  1  . traZODone (DESYREL) 100 MG tablet    Sig: Take 1 tablet (100 mg total) by mouth at bedtime as needed for sleep.    Dispense:  90 tablet    Refill:  1  . gabapentin (NEURONTIN) 300 MG capsule    Sig: Take 1 capsule (300 mg total) by mouth 3 (three) times daily. Take 300mg  qhs x1 week then may increase to 300mg  TID    Dispense:  90 capsule    Refill:  3  . cyclobenzaprine (FLEXERIL) 10 MG tablet    Sig: Take 1 tablet (10 mg total) by mouth 3 (three) times daily as needed for muscle spasms.    Dispense:  30 tablet    Refill:  1    Return in about 4 weeks (around 11/02/2020) for HTN/IBS/Neuropathy.    This visit occurred during the SARS-CoV-2 public health emergency.  Safety protocols were in place, including screening questions prior to the visit, additional usage of staff PPE, and extensive cleaning of exam room while observing appropriate contact time as indicated for disinfecting solutions.

## 2020-10-11 NOTE — Assessment & Plan Note (Signed)
Trial of gabapentin.  Checking TSH, glucose and B12 levels.

## 2020-10-11 NOTE — Assessment & Plan Note (Signed)
History of weight loss surgery (gastric sleeve).  Checking b12, iron panel and vitamin d in addition to CMP, CBC and TSH

## 2020-10-11 NOTE — Assessment & Plan Note (Signed)
No SI at this time.  Adding trazodone on for associated insomnia.

## 2020-10-13 ENCOUNTER — Other Ambulatory Visit: Payer: Self-pay | Admitting: Family Medicine

## 2020-10-13 DIAGNOSIS — R748 Abnormal levels of other serum enzymes: Secondary | ICD-10-CM

## 2020-10-13 MED ORDER — VITAMIN D (ERGOCALCIFEROL) 1.25 MG (50000 UNIT) PO CAPS: 50000.0000 [IU] | ORAL_CAPSULE | ORAL | 0 refills | Status: DC

## 2020-10-17 ENCOUNTER — Encounter: Payer: Self-pay | Admitting: Family Medicine

## 2020-10-19 ENCOUNTER — Encounter: Payer: Self-pay | Admitting: Family Medicine

## 2020-10-30 ENCOUNTER — Other Ambulatory Visit: Payer: Self-pay

## 2020-10-30 ENCOUNTER — Ambulatory Visit (INDEPENDENT_AMBULATORY_CARE_PROVIDER_SITE_OTHER): Payer: 59

## 2020-10-30 DIAGNOSIS — R748 Abnormal levels of other serum enzymes: Secondary | ICD-10-CM

## 2020-10-30 DIAGNOSIS — R7401 Elevation of levels of liver transaminase levels: Secondary | ICD-10-CM | POA: Diagnosis not present

## 2020-11-02 ENCOUNTER — Ambulatory Visit (INDEPENDENT_AMBULATORY_CARE_PROVIDER_SITE_OTHER): Payer: 59 | Admitting: Family Medicine

## 2020-11-02 ENCOUNTER — Encounter: Payer: Self-pay | Admitting: Family Medicine

## 2020-11-02 ENCOUNTER — Other Ambulatory Visit: Payer: Self-pay

## 2020-11-02 VITALS — BP 152/92 | HR 84 | Ht 71.0 in | Wt 174.0 lb

## 2020-11-02 DIAGNOSIS — I10 Essential (primary) hypertension: Secondary | ICD-10-CM

## 2020-11-02 DIAGNOSIS — K580 Irritable bowel syndrome with diarrhea: Secondary | ICD-10-CM

## 2020-11-02 DIAGNOSIS — G6289 Other specified polyneuropathies: Secondary | ICD-10-CM

## 2020-11-02 DIAGNOSIS — R748 Abnormal levels of other serum enzymes: Secondary | ICD-10-CM

## 2020-11-02 NOTE — Assessment & Plan Note (Signed)
Continue dicyclomine.  Work on incorporation of fiber.  Accommodations in place from employer

## 2020-11-02 NOTE — Assessment & Plan Note (Signed)
TSH, B12 and glucose normal.  Referral to neurology for nerve conduction

## 2020-11-02 NOTE — Progress Notes (Signed)
[name] - 45 y.o. female MRN [id number]  Date of birth: [birth year]  Subjective Chief Complaint  Patient presents with   Follow-up    HPI [name] is a 45 y.o. female here today for follow up visit.  She reports that she is doing pretty well.  She continues to have problems with neuropathy.  History of gastric bypass.  Recent B12 levels were normal.    Her employer is working with her to accommodate her IBS issues.  This is stable at this time.  Her LFT"s were elevated on recent labs.  Recent RUQ [place] was normal.  She denies significant EtOH use.    ROS:  A comprehensive ROS was completed and negative except as noted per HPI  Allergies  Allergen Reactions   Penicillins Anaphylaxis, Swelling and Rash    Has patient had a PCN reaction causing immediate rash, facial/tongue/throat swelling, SOB or lightheadedness with hypotension: Yes Has patient had a PCN reaction causing severe rash involving mucus membranes or skin necrosis: No Has patient had a PCN reaction that required hospitalization No Has patient had a PCN reaction occurring within the last 10 years: No If all of the above answers are "NO", then may proceed with Cephalosporin use.  Face and throat swell    Past Medical History:  Diagnosis Date   Adnexal cyst 2013   right   Anemia 2015   Pernicious and iron deficiency. As of 2015/2016 no longer requiring po iron or B12 shots.    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 arthroscopy x 2.    Colitis    Headache(784.0)    migraines   Hydradenitis 2001   axillary, multiple surgical I & Ds/excisions.   Hypertension    Nausea & vomiting 11/28/2014   Obesity (BMI 30.0-34.9)    Pilonidal cyst    Pityriasis rosea    pt also gives hx of seborrheic dermatitis.     Past Surgical History:  Procedure Laterality Date   ABDOMINAL HYSTERECTOMY     CYSTECTOMY     2004 axilla bil   ESOPHAGOGASTRODUODENOSCOPY N/A 11/30/2014   Procedure: ESOPHAGOGASTRODUODENOSCOPY (EGD);   Surgeon: Jerene Bears, MD;  Location: [facility] ENDOSCOPY;  Service: Endoscopy;  Laterality: N/A;   FRACTURE SURGERY     rt arm   GASTRIC BYPASS  2011   gastric sleeve   KNEE SURGERY  2005   left   LAPAROSCOPIC CHOLECYSTECTOMY  2002   MEDIAL PATELLOFEMORAL LIGAMENT REPAIR Left 06/08/2013   Procedure: DIAGNOSTIC OPERATIVE ARTHROSCOPY, OPEN MEDIAL PATELLA FEMORAL LIGAMENT RECONSTRUCTION;  Surgeon: Meredith Pel, MD;  Location: [facility];  Service: Orthopedics;  Laterality: Left;  with Hamstring autograft   OVARIAN CYST REMOVAL     PARTIAL HYSTERECTOMY     R arm surgery      Social History   Socioeconomic History   Marital status: Single    Spouse name: Not on file   Number of children: Not on file   Years of education: Not on file   Highest education level: Not on file  Occupational History   Occupation: Writer    Employer: [employer]    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   Tobacco Use   Smoking status: Every Day    Packs/day: 0.50    Years: 10.00    Pack years: 5.00    Types: Cigarettes    Start date: 05/20/2013   Smokeless tobacco: Never  Vaping Use   Vaping Use: Never used  Substance  and Sexual Activity   Alcohol use: Yes    Alcohol/week: 1.0 - 2.0 standard drink    Types: 1 - 2 Standard drinks or equivalent per week    Comment: occ   Drug use: Yes    Types: Other-see comments    Comment: CBD rub   Sexual activity: Yes    Partners: Male    Birth control/protection: Surgical  Other Topics Concern   Not on file  Social History Narrative   Not on file   Social Determinants of Health   Financial Resource Strain: Not on file  Food Insecurity: Not on file  Transportation Needs: Not on file  Physical Activity: Not on file  Stress: Not on file  Social Connections: Not on file    Family History  Problem Relation Age of Onset   Lupus Mother    Stroke Mother    Hypertension Mother    Crohn's disease Father    Hypertension Father     Gallbladder disease Father    Cancer Maternal Grandfather        lung   COPD Maternal Grandfather    Emphysema Maternal Grandfather    Lung cancer Maternal Grandmother    Stroke Maternal Grandmother    Coronary artery disease Paternal Grandfather     Health Maintenance  Topic Date Due   Pneumococcal Vaccine 98-44 Years old (1 - PCV) Never done   Hepatitis C Screening  Never done   PAP SMEAR-Modifier  Never done   COLONOSCOPY (Pts 45-69yrs Insurance coverage will need to be confirmed)  Never done   COVID-19 Vaccine (3 - Booster for Moderna series) 10/04/2020   INFLUENZA VACCINE  12/18/2020   TETANUS/TDAP  09/05/2024   HIV Screening  Completed   HPV VACCINES  Aged Out     ----------------------------------------------------------------------------------------------------------------------------------------------------------------------------------------------------------------- Physical Exam BP (!) 152/92 (BP Location: Left Arm, Patient Position: Sitting, Cuff Size: Normal)   Pulse 84   Ht 5\' 11"  (1.803 m)   Wt 174 lb (78.9 kg)   SpO2 97%   BMI 24.27 kg/m   Physical Exam Constitutional:      Appearance: Normal appearance.  HENT:     Head: Normocephalic and atraumatic.  Eyes:     General: No scleral icterus. Cardiovascular:     Rate and Rhythm: Normal rate and regular rhythm.  Pulmonary:     Effort: Pulmonary effort is normal.     Breath sounds: Normal breath sounds.  Musculoskeletal:     Cervical back: Neck supple.  Neurological:     General: No focal deficit present.     Mental Status: She is alert.  Psychiatric:        Mood and Affect: Mood normal.        Behavior: Behavior normal.    ------------------------------------------------------------------------------------------------------------------------------------------------------------------------------------------------------------------- Assessment and Plan  Peripheral neuropathy TSH, B12 and glucose  normal.  Referral to neurology for nerve conduction  Irritable bowel syndrome with diarrhea Continue dicyclomine.  Work on incorporation of fiber.  Accommodations in place from employer  HTN (hypertension) BP elevated today.  Recommend low sodium diet and smoking cessation.  She doesn't want to add medication yet.    Elevated liver enzymes Normal RUQ [place].  Repeat hepatic panel and checking Hep C antibody   No orders of the defined types were placed in this encounter.   No follow-ups on file.    This visit occurred during the SARS-CoV-2 public health emergency.  Safety protocols were in place, including screening questions prior to the visit, additional  usage of staff PPE, and extensive cleaning of exam room while observing appropriate contact time as indicated for disinfecting solutions.   

## 2020-11-02 NOTE — Assessment & Plan Note (Signed)
BP elevated today.  Recommend low sodium diet and smoking cessation.  She doesn't want to add medication yet.

## 2020-11-02 NOTE — Patient Instructions (Signed)
Leory Plowman and Daroff's neurology in clinical practice (8th ed., pp. 6644- [phone]). Elsevier."> Goldman-Cecil medicine (26th ed., pp. 2489- 2501). Elsevier.">  Peripheral Neuropathy Peripheral neuropathy is a type of nerve damage. It affects nerves that carry signals between the spinal cord and the arms, legs, and the rest of the body (peripheral nerves). It does not affect nerves in the spinal cord or brain. In peripheral neuropathy, one nerve or a group of nerves may be damaged. Peripheral neuropathy is a broad category that includes many specific nerve disorders,like diabetic neuropathy, hereditary neuropathy, and carpal tunnel syndrome. What are the causes? This condition may be caused by: Diabetes. This is the most common cause of peripheral neuropathy. Nerve injury. Pressure or stress on a nerve that lasts a long time. Lack (deficiency) of B vitamins. This can result from alcoholism, poor diet, or a restricted diet. Infections. Autoimmune diseases, such as rheumatoid arthritis and systemic lupus erythematosus. Nerve diseases that are passed from parent to child (inherited). Some medicines, such as cancer medicines (chemotherapy). Poisonous (toxic) substances, such as lead and mercury. Too little blood flowing to the legs. Kidney disease. Thyroid disease. In some cases, the cause of this condition is not known. What are the signs or symptoms? Symptoms of this condition depend on which of your nerves is damaged. Common symptoms include: Loss of feeling (numbness) in the feet, hands, or both. Tingling in the feet, hands, or both. Burning pain. Very sensitive skin. Weakness. Not being able to move a part of the body (paralysis). Muscle twitching. Clumsiness or poor coordination. Loss of balance. Not being able to control your bladder. Feeling dizzy. Sexual problems. How is this diagnosed? Diagnosing and finding the cause of peripheral neuropathy can be difficult. Your health care  provider will take your medical history and do a physical exam. A neurological exam will also be done. This involves checking things that are affected by your brain, spinal cord, and nerves (nervous system). For example, your health care provider will check your reflexes, how youmove, and what you can feel. You may have other tests, such as: Blood tests. Electromyogram (EMG) and nerve conduction tests. These tests check nerve function and how well the nerves are controlling the muscles. Imaging tests, such as CT scans or MRI to rule out other causes of your symptoms. Removing a small piece of nerve to be examined in a lab (nerve biopsy). Removing and examining a small amount of the fluid that surrounds the brain and spinal cord (lumbar puncture). How is this treated? Treatment for this condition may involve: Treating the underlying cause of the neuropathy, such as diabetes, kidney disease, or vitamin deficiencies. Stopping medicines that can cause neuropathy, such as chemotherapy. Medicine to help relieve pain. Medicines may include: Prescription or over-the-counter pain medicine. Antiseizure medicine. Antidepressants. Pain-relieving patches that are applied to painful areas of skin. Surgery to relieve pressure on a nerve or to destroy a nerve that is causing pain. Physical therapy to help improve movement and balance. Devices to help you move around (assistive devices). Follow these instructions at home: Medicines Take over-the-counter and prescription medicines only as told by your health care provider. Do not take any other medicines without first asking your health care provider. Do not drive or use heavy machinery while taking prescription pain medicine. Lifestyle  Do not use any products that contain nicotine or tobacco, such as cigarettes and e-cigarettes. Smoking keeps blood from reaching damaged nerves. If you need help quitting, ask your health care provider. Avoid or limit  alcohol. Too much alcohol can cause a vitamin B deficiency, and vitamin B is needed for healthy nerves. Eat a healthy diet. This includes: Eating foods that are high in fiber, such as fresh fruits and vegetables, whole grains, and beans. Limiting foods that are high in fat and processed sugars, such as fried or sweet foods.  General instructions  If you have diabetes, work closely with your health care provider to keep your blood sugar under control. If you have numbness in your feet: Check every day for signs of injury or infection. Watch for redness, warmth, and swelling. Wear padded socks and comfortable shoes. These help protect your feet. Develop a good support system. Living with peripheral neuropathy can be stressful. Consider talking with a mental health specialist or joining a support group. Use assistive devices and attend physical therapy as told by your health care provider. This may include using a walker or a cane. Keep all follow-up visits as told by your health care provider. This is important.  Contact a health care provider if: You have new signs or symptoms of peripheral neuropathy. You are struggling emotionally from dealing with peripheral neuropathy. Your pain is not well-controlled. Get help right away if: You have an injury or infection that is not healing normally. You develop new weakness in an arm or leg. You have fallen or do so frequently. Summary Peripheral neuropathy is when the nerves in the arms, or legs are damaged, resulting in numbness, weakness, or pain. There are many causes of peripheral neuropathy, including diabetes, pinched nerves, vitamin deficiencies, autoimmune disease, and hereditary conditions. Diagnosing and finding the cause of peripheral neuropathy can be difficult. Your health care provider will take your medical history, do a physical exam, and do tests, including blood tests and nerve function tests. Treatment involves treating the  underlying cause of the neuropathy and taking medicines to help control pain. Physical therapy and assistive devices may also help. This information is not intended to replace advice given to you by your health care provider. Make sure you discuss any questions you have with your healthcare provider. Document Revised: 02/15/2020 Document Reviewed: 02/15/2020 Elsevier Patient Education  2022 [employer].

## 2020-11-02 NOTE — Assessment & Plan Note (Signed)
Normal RUQ [place].  Repeat hepatic panel and checking Hep C antibody

## 2020-11-03 LAB — HEPATIC FUNCTION PANEL
AG Ratio: 1.4 (calc) (ref 1.0–2.5)
ALT: 54 U/L — ABNORMAL HIGH (ref 6–29)
AST: 43 U/L — ABNORMAL HIGH (ref 10–35)
Albumin: 3.3 g/dL — ABNORMAL LOW (ref 3.6–5.1)
Alkaline phosphatase (APISO): 268 U/L — ABNORMAL HIGH (ref 31–125)
Bilirubin, Direct: 0.2 mg/dL (ref 0.0–0.2)
Globulin: 2.4 g/dL (calc) (ref 1.9–3.7)
Indirect Bilirubin: 0.6 mg/dL (calc) (ref 0.2–1.2)
Total Bilirubin: 0.8 mg/dL (ref 0.2–1.2)
Total Protein: 5.7 g/dL — ABNORMAL LOW (ref 6.1–8.1)

## 2020-11-03 LAB — HEPATITIS C ANTIBODY
Hepatitis C Ab: NONREACTIVE
SIGNAL TO CUT-OFF: 0.11 (ref <1.00)

## 2020-11-10 ENCOUNTER — Telehealth: Payer: 59 | Admitting: Physician Assistant

## 2020-11-10 DIAGNOSIS — G43009 Migraine without aura, not intractable, without status migrainosus: Secondary | ICD-10-CM

## 2020-11-10 MED ORDER — RIZATRIPTAN BENZOATE 10 MG PO TABS: 10.0000 mg | ORAL_TABLET | Freq: Once | ORAL | 0 refills | Status: DC

## 2020-11-10 NOTE — Patient Instructions (Signed)
  [name], thank you for joining [name], PA-C for today's virtual visit.  While this provider is not your primary care provider (PCP), if your PCP is located in our provider database this encounter information will be shared with them immediately following your visit.  Consent: (Patient) [name] provided verbal consent for this virtual visit at the beginning of the encounter.  Current Medications:  Current Outpatient Medications:    cyclobenzaprine (FLEXERIL) 10 MG tablet, Take 1 tablet (10 mg total) by mouth 3 (three) times daily as needed for muscle spasms., Disp: 30 tablet, Rfl: 1   dicyclomine (BENTYL) 10 MG capsule, Take 1 capsule (10 mg total) by mouth 4 (four) times daily -  before meals and at bedtime., Disp: 90 capsule, Rfl: 1   gabapentin (NEURONTIN) 300 MG capsule, Take 1 capsule (300 mg total) by mouth 3 (three) times daily. Take 300mg  qhs x1 week then may increase to 300mg  TID, Disp: 90 capsule, Rfl: 3   traZODone (DESYREL) 100 MG tablet, Take 1 tablet (100 mg total) by mouth at bedtime as needed for sleep., Disp: 90 tablet, Rfl: 1   vitamin B-12 (CYANOCOBALAMIN) 1000 MCG tablet, Take 1,000 mcg by mouth daily. (Patient not taking: Reported on 10/05/2020), Disp: , Rfl:    Vitamin D, Ergocalciferol, (DRISDOL) 1.25 MG (50000 UNIT) CAPS capsule, Take 1 capsule (50,000 Units total) by mouth every 7 (seven) days., Disp: 12 capsule, Rfl: 0   Medications ordered in this encounter:  No orders of the defined types were placed in this encounter.    *If you need refills on other medications prior to your next appointment, please contact your pharmacy*  Follow-Up: Call back or seek an in-person evaluation if the symptoms worsen or if the condition fails to improve as anticipated.  Other Instructions Please keep hydrated and get plenty of rest. Take the Maxalt as directed to help abort the headache. You can drink a small glass of caffeinated beverage to help.   You need to schedule a follow-up with your PCP to discuss recurrence of headaches and if they can get your imaging moved up/start preventive medicine, etc.  If you note any vision changes, confusion, severe headache or if headache is not resolving with medication given, you need in person evaluation at nearest ER.   If you have been instructed to have an in-person evaluation today at a local Urgent Care facility, please use the link below. It will take you to a list of all of our available [facility], including address, phone number and hours of operation. Please do not delay care.  [facility]  If you or a family member do not have a primary care provider, use the link below to schedule a visit and establish care. When you choose a [facility] primary care physician or advanced practice provider, you gain a long-term partner in health. Find a Primary Care Provider  Learn more about [facility]'s in-office and virtual care options: [facility]

## 2020-11-10 NOTE — Progress Notes (Signed)
Virtual Visit Consent   [name], you are scheduled for a virtual visit with a [facility] provider today.     Just as with appointments in the office, your consent must be obtained to participate.  Your consent will be active for this visit and any virtual visit you may have with one of our providers in the next 365 days.     If you have a MyChart account, a copy of this consent can be sent to you electronically.  All virtual visits are billed to your insurance company just like a traditional visit in the office.    As this is a virtual visit, video technology does not allow for your provider to perform a traditional examination.  This may limit your provider's ability to fully assess your condition.  If your provider identifies any concerns that need to be evaluated in person or the need to arrange testing (such as labs, EKG, etc.), we will make arrangements to do so.     Although advances in technology are sophisticated, we cannot ensure that it will always work on either your end or our end.  If the connection with a video visit is poor, the visit may have to be switched to a telephone visit.  With either a video or telephone visit, we are not always able to ensure that we have a secure connection.     I need to obtain your verbal consent now.   Are you willing to proceed with your visit today?    [name] has provided verbal consent on 11/10/2020 for a virtual visit (video or telephone).   [name], [place]   Date: 11/10/2020 2:07 PM  Virtual Visit via Video Note   I, [name], connected with [name]  ([id number], 21-Sep-1975) on 11/10/20 at  1:45 PM EDT by a video-enabled telemedicine application and verified that I am speaking with the correct person using two identifiers.  Location: Patient: Virtual Visit Location Patient: Home Provider: Virtual Visit Location Provider: Home Office   I discussed the limitations of evaluation and  management by telemedicine and the availability of in person appointments. The patient expressed understanding and agreed to proceed.    History of Present Illness: [name] is a 45 y.o. who identifies as a female who was assigned female at birth, and is being seen today for acute headache. Patient endorses history of migraine headache but has not had issue in some time. Notes over past 6 months has been getting periodic headaches. She does have a history of pituitary adenoma and is awaiting an updated MRI ordered by her PCP (per patient report). Current headache has been present since yesterday afternoon/evening, first noted while she was sitting on her bed. Notes frontal pain initially unilateral (R-sided), associated with nausea and photophobia. No vomiting, aura, vision changes. Denies fever, chills, AMS. Has taken Excedrin migraine without much improvement. States she was unable to get in with PCP until next week.   HPI: HPI  Problems:  Patient Active Problem List   Diagnosis Date Noted   Elevated liver enzymes 11/02/2020   Peripheral neuropathy 10/11/2020   Accidental drug overdose 08/18/2019   Suicide attempt ([place]) 08/17/2019   Irritable bowel syndrome with diarrhea 02/02/2018   Generalized anxiety disorder 02/02/2018   Perirectal abscess 10/28/2016   Chronic fatigue 01/09/2016   Adrenal nodule ([place]) 01/09/2016   Chronic low back pain 11/21/2015   Benign essential hypertension 08/13/2015   Elevated lipase 12/14/2014  AP (abdominal pain) 12/14/2014   Nausea and vomiting 11/29/2014   Intractable abdominal pain 11/28/2014   Hypokalemia 11/28/2014   HTN (hypertension) 11/28/2014   Pilonidal abscess 12/03/2013   Patellar instability of left knee 06/08/2013    Allergies:  Allergies  Allergen Reactions   Penicillins Anaphylaxis, Swelling and Rash    Has patient had a PCN reaction causing immediate rash, facial/tongue/throat swelling, SOB or lightheadedness with  hypotension: Yes Has patient had a PCN reaction causing severe rash involving mucus membranes or skin necrosis: No Has patient had a PCN reaction that required hospitalization No Has patient had a PCN reaction occurring within the last 10 years: No If all of the above answers are "NO", then may proceed with Cephalosporin use.  Face and throat swell   Medications:  Current Outpatient Medications:    rizatriptan (MAXALT) 10 MG tablet, Take 1 tablet (10 mg total) by mouth once for 1 dose. May repeat in 2 hours if needed, Disp: 10 tablet, Rfl: 0   cyclobenzaprine (FLEXERIL) 10 MG tablet, Take 1 tablet (10 mg total) by mouth 3 (three) times daily as needed for muscle spasms., Disp: 30 tablet, Rfl: 1   dicyclomine (BENTYL) 10 MG capsule, Take 1 capsule (10 mg total) by mouth 4 (four) times daily -  before meals and at bedtime., Disp: 90 capsule, Rfl: 1   gabapentin (NEURONTIN) 300 MG capsule, Take 1 capsule (300 mg total) by mouth 3 (three) times daily. Take 300mg  qhs x1 week then may increase to 300mg  TID, Disp: 90 capsule, Rfl: 3   traZODone (DESYREL) 100 MG tablet, Take 1 tablet (100 mg total) by mouth at bedtime as needed for sleep., Disp: 90 tablet, Rfl: 1   vitamin B-12 (CYANOCOBALAMIN) 1000 MCG tablet, Take 1,000 mcg by mouth daily. (Patient not taking: Reported on 10/05/2020), Disp: , Rfl:    Vitamin D, Ergocalciferol, (DRISDOL) 1.25 MG (50000 UNIT) CAPS capsule, Take 1 capsule (50,000 Units total) by mouth every 7 (seven) days., Disp: 12 capsule, Rfl: 0  Observations/Objective: Patient is well-developed, well-nourished in no acute distress.  Resting comfortably at home.  Head is normocephalic, atraumatic.  No labored breathing. Speech is clear and coherent with logical content.  Patient is alert and oriented at baseline.  Cranial nerves grossly intact. Normal facial movement and muscle tone. EOMI.  Assessment and Plan: 1. Migraine without aura and without status migrainosus, not  intractable - rizatriptan (MAXALT) 10 MG tablet; Take 1 tablet (10 mg total) by mouth once for 1 dose. May repeat in 2 hours if needed  Dispense: 10 tablet; Refill: 0 No alarm signs/symptoms. Will Rx Maxalt for abortive therapy. Supportive measures and OTC medications reviewed. She was instructed to follow-up with PCP next week to discuss potential of preventive medication for headaches pending her further workup with MRI, etc. ER precautions reviewed  Follow Up Instructions: I discussed the assessment and treatment plan with the patient. The patient was provided an opportunity to ask questions and all were answered. The patient agreed with the plan and demonstrated an understanding of the instructions.  A copy of instructions were sent to the patient via MyChart.  The patient was advised to call back or seek an in-person evaluation if the symptoms worsen or if the condition fails to improve as anticipated.  Time:  I spent 15 minutes with the patient via telehealth technology discussing the above problems/concerns.    Erin Rio, PA-C

## 2020-11-13 ENCOUNTER — Telehealth: Payer: 59 | Admitting: Physician Assistant

## 2020-11-13 ENCOUNTER — Encounter: Payer: Self-pay | Admitting: Physician Assistant

## 2020-11-13 ENCOUNTER — Telehealth: Payer: 59

## 2020-11-13 DIAGNOSIS — G43701 Chronic migraine without aura, not intractable, with status migrainosus: Secondary | ICD-10-CM | POA: Diagnosis not present

## 2020-11-13 MED ORDER — PREDNISONE 20 MG PO TABS: 40.0000 mg | ORAL_TABLET | Freq: Every day | ORAL | 0 refills | Status: DC

## 2020-11-13 MED ORDER — ONDANSETRON HCL 4 MG PO TABS: 4.0000 mg | ORAL_TABLET | Freq: Three times a day (TID) | ORAL | 0 refills | Status: DC | PRN

## 2020-11-13 NOTE — Progress Notes (Signed)
Ms. sande, pickert are scheduled for a virtual visit with your provider today.    Just as we do with appointments in the office, we must obtain your consent to participate.  Your consent will be active for this visit and any virtual visit you may have with one of our providers in the next 365 days.    If you have a MyChart account, I can also send a copy of this consent to you electronically.  All virtual visits are billed to your insurance company just like a traditional visit in the office.  As this is a virtual visit, video technology does not allow for your provider to perform a traditional examination.  This may limit your provider's ability to fully assess your condition.  If your provider identifies any concerns that need to be evaluated in person or the need to arrange testing such as labs, EKG, etc, we will make arrangements to do so.    Although advances in technology are sophisticated, we cannot ensure that it will always work on either your end or our end.  If the connection with a video visit is poor, we may have to switch to a telephone visit.  With either a video or telephone visit, we are not always able to ensure that we have a secure connection.   I need to obtain your verbal consent now.   Are you willing to proceed with your visit today?   [name] has provided verbal consent on 11/13/2020 for a virtual visit (video or telephone).   Mar Daring, PA-C 11/13/2020  2:00 PM  Virtual Visit Consent   [name], you are scheduled for a virtual visit with a [facility] provider today.     Just as with appointments in the office, your consent must be obtained to participate.  Your consent will be active for this visit and any virtual visit you may have with one of our providers in the next 365 days.     If you have a MyChart account, a copy of this consent can be sent to you electronically.  All virtual visits are billed to your insurance company just like a  traditional visit in the office.    As this is a virtual visit, video technology does not allow for your provider to perform a traditional examination.  This may limit your provider's ability to fully assess your condition.  If your provider identifies any concerns that need to be evaluated in person or the need to arrange testing (such as labs, EKG, etc.), we will make arrangements to do so.     Although advances in technology are sophisticated, we cannot ensure that it will always work on either your end or our end.  If the connection with a video visit is poor, the visit may have to be switched to a telephone visit.  With either a video or telephone visit, we are not always able to ensure that we have a secure connection.     I need to obtain your verbal consent now.   Are you willing to proceed with your visit today?    [name] has provided verbal consent on 11/13/2020 for a virtual visit (video or telephone).   Mar Daring, PA-C   Date: 11/13/2020 2:00 PM   Virtual Visit via Video Note   Reynolds Bowl, connected [email or web address]@ ([id number], May 29, 1975) on 11/13/20 at 12:45 PM EDT by a video-enabled telemedicine application and verified that I am speaking  with the correct person using two identifiers.  Location: Patient: Virtual Visit Location Patient: Home Provider: Virtual Visit Location Provider: Home Office   I discussed the limitations of evaluation and management by telemedicine and the availability of in person appointments. The patient expressed understanding and agreed to proceed.    History of Present Illness: [name] is a 45 y.o. who identifies as a female who was assigned female at birth, and is being seen today for migraine. She was seen on 11/10/20 for the same issue and was started on Maxalt. She reports no improvement in symptoms. She is also having to take Excedrin, Goody's powder, ibuprofen alternating. Nothing is relieving symptoms. She  reports pain is similar to previous migraines. She has had migraines for years, but has not had migraines like this since 2016. She is not on abortive or preventative treatment prior to having the Maxalt prescribed on the 24th. She does mention having tried multiple abortive treatments, with Midrin being most successful.   HPI: Migraine    Problems:  Patient Active Problem List   Diagnosis Date Noted   Elevated liver enzymes 11/02/2020   Peripheral neuropathy 10/11/2020   Accidental drug overdose 08/18/2019   Suicide attempt ([place]) 08/17/2019   Irritable bowel syndrome with diarrhea 02/02/2018   Generalized anxiety disorder 02/02/2018   Perirectal abscess 10/28/2016   Chronic fatigue 01/09/2016   Adrenal nodule ([place]) 01/09/2016   Chronic low back pain 11/21/2015   Benign essential hypertension 08/13/2015   Elevated lipase 12/14/2014   AP (abdominal pain) 12/14/2014   Nausea and vomiting 11/29/2014   Intractable abdominal pain 11/28/2014   Hypokalemia 11/28/2014   HTN (hypertension) 11/28/2014   Pilonidal abscess 12/03/2013   Patellar instability of left knee 06/08/2013    Allergies:  Allergies  Allergen Reactions   Penicillins Anaphylaxis, Swelling and Rash    Has patient had a PCN reaction causing immediate rash, facial/tongue/throat swelling, SOB or lightheadedness with hypotension: Yes Has patient had a PCN reaction causing severe rash involving mucus membranes or skin necrosis: No Has patient had a PCN reaction that required hospitalization No Has patient had a PCN reaction occurring within the last 10 years: No If all of the above answers are "NO", then may proceed with Cephalosporin use.  Face and throat swell   Medications:  Current Outpatient Medications:    ondansetron (ZOFRAN) 4 MG tablet, Take 1 tablet (4 mg total) by mouth every 8 (eight) hours as needed for nausea or vomiting., Disp: 20 tablet, Rfl: 0   predniSONE (DELTASONE) 20 MG tablet, Take 2 tablets (40 mg  total) by mouth daily with breakfast., Disp: 10 tablet, Rfl: 0   cyclobenzaprine (FLEXERIL) 10 MG tablet, Take 1 tablet (10 mg total) by mouth 3 (three) times daily as needed for muscle spasms., Disp: 30 tablet, Rfl: 1   dicyclomine (BENTYL) 10 MG capsule, Take 1 capsule (10 mg total) by mouth 4 (four) times daily -  before meals and at bedtime., Disp: 90 capsule, Rfl: 1   gabapentin (NEURONTIN) 300 MG capsule, Take 1 capsule (300 mg total) by mouth 3 (three) times daily. Take 300mg  qhs x1 week then may increase to 300mg  TID, Disp: 90 capsule, Rfl: 3   rizatriptan (MAXALT) 10 MG tablet, Take 1 tablet (10 mg total) by mouth once for 1 dose. May repeat in 2 hours if needed, Disp: 10 tablet, Rfl: 0   traZODone (DESYREL) 100 MG tablet, Take 1 tablet (100 mg total) by mouth at bedtime as needed  for sleep., Disp: 90 tablet, Rfl: 1   vitamin B-12 (CYANOCOBALAMIN) 1000 MCG tablet, Take 1,000 mcg by mouth daily. (Patient not taking: Reported on 10/05/2020), Disp: , Rfl:    Vitamin D, Ergocalciferol, (DRISDOL) 1.25 MG (50000 UNIT) CAPS capsule, Take 1 capsule (50,000 Units total) by mouth every 7 (seven) days., Disp: 12 capsule, Rfl: 0  Observations/Objective: Patient is well-developed, well-nourished in no acute distress.  Resting comfortably at home.  Head is normocephalic, atraumatic.  No labored breathing.  Speech is clear and coherent with logical content.  Patient is alert and oriented at baseline.  Neuro grossly intact, but exam extremely limited due to video  Assessment and Plan: 1. Chronic migraine without aura with status migrainosus, not intractable - predniSONE (DELTASONE) 20 MG tablet; Take 2 tablets (40 mg total) by mouth daily with breakfast.  Dispense: 10 tablet; Refill: 0 - ondansetron (ZOFRAN) 4 MG tablet; Take 1 tablet (4 mg total) by mouth every 8 (eight) hours as needed for nausea or vomiting.  Dispense: 20 tablet; Refill: 0 - Patient in a status migrainous migraine with migraine  persistent over 6 days - Prednisone prescribed to try to abort migraine - Advised to try to avoid Excedrin, Goody's, Ibuprofen as may be causing rebound headaches - Advised to set f/u appt with PCP to discuss further abortive and possibly preventative treatments (we discussed Ubrelvy, Nurtec, Emgality, Aimovig) - Seek emergent help if headaches worsens, severe vomiting, dizziness, double vision, or confusion occur  Follow Up Instructions: I discussed the assessment and treatment plan with the patient. The patient was provided an opportunity to ask questions and all were answered. The patient agreed with the plan and demonstrated an understanding of the instructions.  A copy of instructions were sent to the patient via MyChart.  The patient was advised to call back or seek an in-person evaluation if the symptoms worsen or if the condition fails to improve as anticipated.  Time:  I spent 18 minutes with the patient via telehealth technology discussing the above problems/concerns.    Mar Daring, PA-C

## 2020-11-13 NOTE — Patient Instructions (Signed)
[name], thank you for joining Mar Daring, PA-C for today's virtual visit.  While this provider is not your primary care provider (PCP), if your PCP is located in our provider database this encounter information will be shared with them immediately following your visit.  Consent: (Patient) [name] provided verbal consent for this virtual visit at the beginning of the encounter.  Current Medications:  Current Outpatient Medications:    ondansetron (ZOFRAN) 4 MG tablet, Take 1 tablet (4 mg total) by mouth every 8 (eight) hours as needed for nausea or vomiting., Disp: 20 tablet, Rfl: 0   predniSONE (DELTASONE) 20 MG tablet, Take 2 tablets (40 mg total) by mouth daily with breakfast., Disp: 10 tablet, Rfl: 0   cyclobenzaprine (FLEXERIL) 10 MG tablet, Take 1 tablet (10 mg total) by mouth 3 (three) times daily as needed for muscle spasms., Disp: 30 tablet, Rfl: 1   dicyclomine (BENTYL) 10 MG capsule, Take 1 capsule (10 mg total) by mouth 4 (four) times daily -  before meals and at bedtime., Disp: 90 capsule, Rfl: 1   gabapentin (NEURONTIN) 300 MG capsule, Take 1 capsule (300 mg total) by mouth 3 (three) times daily. Take 300mg  qhs x1 week then may increase to 300mg  TID, Disp: 90 capsule, Rfl: 3   rizatriptan (MAXALT) 10 MG tablet, Take 1 tablet (10 mg total) by mouth once for 1 dose. May repeat in 2 hours if needed, Disp: 10 tablet, Rfl: 0   traZODone (DESYREL) 100 MG tablet, Take 1 tablet (100 mg total) by mouth at bedtime as needed for sleep., Disp: 90 tablet, Rfl: 1   vitamin B-12 (CYANOCOBALAMIN) 1000 MCG tablet, Take 1,000 mcg by mouth daily. (Patient not taking: Reported on 10/05/2020), Disp: , Rfl:    Vitamin D, Ergocalciferol, (DRISDOL) 1.25 MG (50000 UNIT) CAPS capsule, Take 1 capsule (50,000 Units total) by mouth every 7 (seven) days., Disp: 12 capsule, Rfl: 0   Medications ordered in this encounter:  Meds ordered this encounter  Medications   predniSONE  (DELTASONE) 20 MG tablet    Sig: Take 2 tablets (40 mg total) by mouth daily with breakfast.    Dispense:  10 tablet    Refill:  0    Order Specific Question:   Supervising Provider    Answer:   MILLER, BRIAN [[id number]]   ondansetron (ZOFRAN) 4 MG tablet    Sig: Take 1 tablet (4 mg total) by mouth every 8 (eight) hours as needed for nausea or vomiting.    Dispense:  20 tablet    Refill:  0    Order Specific Question:   Supervising Provider    Answer:   Sabra Heck, BRIAN [[id number]]     *If you need refills on other medications prior to your next appointment, please contact your pharmacy*  Follow-Up: Call back or seek an in-person evaluation if the symptoms worsen or if the condition fails to improve as anticipated.  If you have been instructed to have an in-person evaluation today at a local Urgent Care facility, please use the link below. It will take you to a list of all of our available [facility], including address, phone number and hours of operation. Please do not delay care.  [facility]  If you or a family member do not have a primary care provider, use the link below to schedule a visit and establish care. When you choose a [facility] primary care physician or advanced practice provider, you  gain a long-term partner in health. Find a Primary Care Provider  Learn more about [facility] in-office and virtual care options: [facility]  Migraine Headache A migraine headache is an intense, throbbing pain on one side or both sides of the head. Migraine headaches may also cause other symptoms, such as nausea, vomiting, and sensitivity to light and noise. A migraine headache can last from 4 hours to 3 days. Talk with your doctor about what things may bring on (trigger) your migraine headaches. What are the causes? The exact cause of this condition is not known. However, a migraine may be caused when nerves in the brain become irritated and release  chemicals that cause inflammation of blood vessels. This inflammation causes pain. This condition may be triggered or caused by: Drinking alcohol. Smoking. Taking medicines, such as: Medicine used to treat chest pain (nitroglycerin). Birth control pills. Estrogen. Certain blood pressure medicines. Eating or drinking products that contain nitrates, glutamate, aspartame, or tyramine. Aged cheeses, chocolate, or caffeine may also be triggers. Doing physical activity. Other things that may trigger a migraine headache include: Menstruation. Pregnancy. Hunger. Stress. Lack of sleep or too much sleep. Weather changes. Fatigue. What increases the risk? The following factors may make you more likely to experience migraine headaches: Being a certain age. This condition is more common in people who are 40-46 years old. Being female. Having a family history of migraine headaches. Being Caucasian. Having a mental health condition, such as depression or anxiety. Being obese. What are the signs or symptoms? The main symptom of this condition is pulsating or throbbing pain. This pain may: Happen in any area of the head, such as on one side or both sides. Interfere with daily activities. Get worse with physical activity. Get worse with exposure to bright lights or loud noises. Other symptoms may include: Nausea. Vomiting. Dizziness. General sensitivity to bright lights, loud noises, or smells. Before you get a migraine headache, you may get warning signs (an aura). An aura may include: Seeing flashing lights or having blind spots. Seeing bright spots, halos, or zigzag lines. Having tunnel vision or blurred vision. Having numbness or a tingling feeling. Having trouble talking. Having muscle weakness. Some people have symptoms after a migraine headache (postdromal phase), such as: Feeling tired. Difficulty concentrating. How is this diagnosed? A migraine headache can be diagnosed based  on: Your symptoms. A physical exam. Tests, such as: CT scan or an MRI of the head. These imaging tests can help rule out other causes of headaches. Taking fluid from the spine (lumbar puncture) and analyzing it (cerebrospinal fluid analysis, or CSF analysis). How is this treated? This condition may be treated with medicines that: Relieve pain. Relieve nausea. Prevent migraine headaches. Treatment for this condition may also include: Acupuncture. Lifestyle changes like avoiding foods that trigger migraine headaches. Biofeedback. Cognitive behavioral therapy. Follow these instructions at home: Medicines Take over-the-counter and prescription medicines only as told by your health care provider. Ask your health care provider if the medicine prescribed to you: Requires you to avoid driving or using heavy machinery. Can cause constipation. You may need to take these actions to prevent or treat constipation: Drink enough fluid to keep your urine pale yellow. Take over-the-counter or prescription medicines. Eat foods that are high in fiber, such as beans, whole grains, and fresh fruits and vegetables. Limit foods that are high in fat and processed sugars, such as fried or sweet foods. Lifestyle Do not drink alcohol. Do not  use any products that contain nicotine or tobacco, such as cigarettes, e-cigarettes, and chewing tobacco. If you need help quitting, ask your health care provider. Get at least 8 hours of sleep every night. Find ways to manage stress, such as meditation, deep breathing, or yoga. General instructions     Keep a journal to find out what may trigger your migraine headaches. For example, write down: What you eat and drink. How much sleep you get. Any change to your diet or medicines. If you have a migraine headache: Avoid things that make your symptoms worse, such as bright lights. It may help to lie down in a dark, quiet room. Do not drive or use heavy  machinery. Ask your health care provider what activities are safe for you while you are experiencing symptoms. Keep all follow-up visits as told by your health care provider. This is important. Contact a health care provider if: You develop symptoms that are different or more severe than your usual migraine headache symptoms. You have more than 15 headache days in one month. Get help right away if: Your migraine headache becomes severe. Your migraine headache lasts longer than 72 hours. You have a fever. You have a stiff neck. You have vision loss. Your muscles feel weak or like you cannot control them. You start to lose your balance often. You have trouble walking. You faint. You have a seizure. Summary A migraine headache is an intense, throbbing pain on one side or both sides of the head. Migraines may also cause other symptoms, such as nausea, vomiting, and sensitivity to light and noise. This condition may be treated with medicines and lifestyle changes. You may also need to avoid certain things that trigger a migraine headache. Keep a journal to find out what may trigger your migraine headaches. Contact your health care provider if you have more than 15 headache days in a month or you develop symptoms that are different or more severe than your usual migraine headache symptoms. This information is not intended to replace advice given to you by your health care provider. Make sure you discuss any questions you have with your healthcare provider. Document Revised: 08/28/2018 Document Reviewed: 06/18/2018 Elsevier Patient Education  [employer].

## 2020-11-15 ENCOUNTER — Telehealth (INDEPENDENT_AMBULATORY_CARE_PROVIDER_SITE_OTHER): Payer: 59 | Admitting: Family Medicine

## 2020-11-15 ENCOUNTER — Encounter: Payer: Self-pay | Admitting: Family Medicine

## 2020-11-15 DIAGNOSIS — G43909 Migraine, unspecified, not intractable, without status migrainosus: Secondary | ICD-10-CM | POA: Insufficient documentation

## 2020-11-15 DIAGNOSIS — G43011 Migraine without aura, intractable, with status migrainosus: Secondary | ICD-10-CM

## 2020-11-15 MED ORDER — NURTEC 75 MG PO TBDP: 75.0000 mg | ORAL_TABLET | Freq: Every day | ORAL | 3 refills | Status: DC | PRN

## 2020-11-15 NOTE — Assessment & Plan Note (Signed)
She has tried [employer].  Previously tried Zomig and Imitrex.  Will add nurtec as needed for migraine.  Complete course of prednisone and try to limit excedrin.

## 2020-11-15 NOTE — Progress Notes (Signed)
[name] - 45 y.o. female MRN [id number]  Date of birth: 08-21-1975   This visit type was conducted due to national recommendations for restrictions regarding the COVID-19 Pandemic (e.g. social distancing).  This format is felt to be most appropriate for this patient at this time.  All issues noted in this document were discussed and addressed.  No physical exam was performed (except for noted visual exam findings with Video Visits).  I discussed the limitations of evaluation and management by telemedicine and the availability of in person appointments. The patient expressed understanding and agreed to proceed.  I connected withNAME@ on 11/15/20 at  9:30 AM EDT by a video enabled telemedicine application and verified that I am speaking with the correct person using two identifiers.  Present at visit: Erin Nutting, DO [name]   Patient Location: Home [address] [place]  [id number]   Provider location:   [facility]  Chief Complaint  Patient presents with   Migraine    HPI  [name] is a 45 y.o. female who presents via audio/video conferencing for a telehealth visit today.  She has complaint of migraine.  She was seen via televisit x2.  Initially given rx for maxalt but this did not improve her symptoms.  At next visit she was prescribed prednisone for possible rebound component but so far this has not really helped.  She does have history of migraines but had not had one this bad in about 5 years.  She does have nausea but denies other symptoms including dizziness, fever, chills, respiratory symptoms, sinus pain or congestion.    ROS:  A comprehensive ROS was completed and negative except as noted per HPI  Past Medical History:  Diagnosis Date   Adnexal cyst 2013   right   Anemia 2015   Pernicious and iron deficiency. As of 2015/2016 no longer requiring po iron or B12 shots.    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 arthroscopy x 2.    Colitis     Headache(784.0)    migraines   Hydradenitis 2001   axillary, multiple surgical I & Ds/excisions.   Hypertension    Nausea & vomiting 11/28/2014   Obesity (BMI 30.0-34.9)    Pilonidal cyst    Pityriasis rosea    pt also gives hx of seborrheic dermatitis.     Past Surgical History:  Procedure Laterality Date   ABDOMINAL HYSTERECTOMY     CYSTECTOMY     2004 axilla bil   ESOPHAGOGASTRODUODENOSCOPY N/A 11/30/2014   Procedure: ESOPHAGOGASTRODUODENOSCOPY (EGD);  Surgeon: Jerene Bears, MD;  Location: [facility] ENDOSCOPY;  Service: Endoscopy;  Laterality: N/A;   FRACTURE SURGERY     rt arm   GASTRIC BYPASS  2011   gastric sleeve   KNEE SURGERY  2005   left   LAPAROSCOPIC CHOLECYSTECTOMY  2002   MEDIAL PATELLOFEMORAL LIGAMENT REPAIR Left 06/08/2013   Procedure: DIAGNOSTIC OPERATIVE ARTHROSCOPY, OPEN MEDIAL PATELLA FEMORAL LIGAMENT RECONSTRUCTION;  Surgeon: Meredith Pel, MD;  Location: [facility];  Service: Orthopedics;  Laterality: Left;  with Hamstring autograft   OVARIAN CYST REMOVAL     PARTIAL HYSTERECTOMY     R arm surgery      Family History  Problem Relation Age of Onset   Lupus Mother    Stroke Mother    Hypertension Mother    Crohn's disease Father    Hypertension Father    Gallbladder disease Father    Cancer Maternal Grandfather  lung   COPD Maternal Grandfather    Emphysema Maternal Grandfather    Lung cancer Maternal Grandmother    Stroke Maternal Grandmother    Coronary artery disease Paternal Grandfather     Social History   Socioeconomic History   Marital status: Single    Spouse name: Not on file   Number of children: Not on file   Years of education: Not on file   Highest education level: Not on file  Occupational History   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   Tobacco Use   Smoking status: Every Day    Packs/day: 0.50    Years: 10.00    Pack years: 5.00    Types:  Cigarettes    Start date: 05/20/2013   Smokeless tobacco: Never  Vaping Use   Vaping Use: Never used  Substance and Sexual Activity   Alcohol use: Yes    Alcohol/week: 1.0 - 2.0 standard drink    Types: 1 - 2 Standard drinks or equivalent per week    Comment: occ   Drug use: Yes    Types: Other-see comments    Comment: CBD rub   Sexual activity: Yes    Partners: Male    Birth control/protection: Surgical  Other Topics Concern   Not on file  Social History Narrative   Not on file   Social Determinants of Health   Financial Resource Strain: Not on file  Food Insecurity: Not on file  Transportation Needs: Not on file  Physical Activity: Not on file  Stress: Not on file  Social Connections: Not on file  Intimate Partner Violence: Not on file     Current Outpatient Medications:    cyclobenzaprine (FLEXERIL) 10 MG tablet, Take 1 tablet (10 mg total) by mouth 3 (three) times daily as needed for muscle spasms., Disp: 30 tablet, Rfl: 1   dicyclomine (BENTYL) 10 MG capsule, Take 1 capsule (10 mg total) by mouth 4 (four) times daily -  before meals and at bedtime., Disp: 90 capsule, Rfl: 1   gabapentin (NEURONTIN) 300 MG capsule, Take 1 capsule (300 mg total) by mouth 3 (three) times daily. Take 300mg  qhs x1 week then may increase to 300mg  TID, Disp: 90 capsule, Rfl: 3   ondansetron (ZOFRAN) 4 MG tablet, Take 1 tablet (4 mg total) by mouth every 8 (eight) hours as needed for nausea or vomiting., Disp: 20 tablet, Rfl: 0   predniSONE (DELTASONE) 20 MG tablet, Take 2 tablets (40 mg total) by mouth daily with breakfast., Disp: 10 tablet, Rfl: 0   Rimegepant Sulfate (NURTEC) 75 MG TBDP, Take 75 mg by mouth daily as needed (migraine)., Disp: 15 tablet, Rfl: 3   traZODone (DESYREL) 100 MG tablet, Take 1 tablet (100 mg total) by mouth at bedtime as needed for sleep., Disp: 90 tablet, Rfl: 1   vitamin B-12 (CYANOCOBALAMIN) 1000 MCG tablet, Take 1,000 mcg by mouth daily., Disp: , Rfl:    Vitamin  D, Ergocalciferol, (DRISDOL) 1.25 MG (50000 UNIT) CAPS capsule, Take 1 capsule (50,000 Units total) by mouth every 7 (seven) days., Disp: 12 capsule, Rfl: 0   rizatriptan (MAXALT) 10 MG tablet, Take 1 tablet (10 mg total) by mouth once for 1 dose. May repeat in 2 hours if needed, Disp: 10 tablet, Rfl: 0  EXAM:  VITALS per patient if applicable: BP (!) 824/23   Ht 5\' 11"  (1.803 m)   Wt 174 lb (  78.9 kg)   BMI 24.27 kg/m   GENERAL: alert, oriented, appears well and in no acute distress  HEENT: atraumatic, conjunttiva clear, no obvious abnormalities on inspection of external nose and ears  NECK: normal movements of the head and neck  LUNGS: on inspection no signs of respiratory distress, breathing rate appears normal, no obvious gross SOB, gasping or wheezing  CV: no obvious cyanosis  MS: moves all visible extremities without noticeable abnormality  PSYCH/NEURO: pleasant and cooperative, no obvious depression or anxiety, speech and thought processing grossly intact  ASSESSMENT AND PLAN:  Discussed the following assessment and plan:  Migraine She has tried maxalt.  Previously tried Zomig and Imitrex.  Will add nurtec as needed for migraine.  Complete course of prednisone and try to limit excedrin.      I discussed the assessment and treatment plan with the patient. The patient was provided an opportunity to ask questions and all were answered. The patient agreed with the plan and demonstrated an understanding of the instructions.   The patient was advised to call back or seek an in-person evaluation if the symptoms worsen or if the condition fails to improve as anticipated.    Erin Nutting, DO

## 2020-11-15 NOTE — Progress Notes (Signed)
Pt was seen at [facility] Given Prednisone 20mg  & Maxalt

## 2020-11-24 ENCOUNTER — Observation Stay (HOSPITAL_BASED_OUTPATIENT_CLINIC_OR_DEPARTMENT_OTHER)
Admission: EM | Admit: 2020-11-24 | Discharge: 2020-11-25 | Disposition: A | Discharge status: Home / Self Care | Payer: 59 | Attending: Internal Medicine | Admitting: Internal Medicine

## 2020-11-24 ENCOUNTER — Other Ambulatory Visit: Payer: Self-pay

## 2020-11-24 ENCOUNTER — Encounter (HOSPITAL_BASED_OUTPATIENT_CLINIC_OR_DEPARTMENT_OTHER): Payer: Self-pay

## 2020-11-24 DIAGNOSIS — R2243 Localized swelling, mass and lump, lower limb, bilateral: Secondary | ICD-10-CM | POA: Insufficient documentation

## 2020-11-24 DIAGNOSIS — S0501XA Injury of conjunctiva and corneal abrasion without foreign body, right eye, initial encounter: Principal | ICD-10-CM | POA: Insufficient documentation

## 2020-11-24 DIAGNOSIS — G43909 Migraine, unspecified, not intractable, without status migrainosus: Secondary | ICD-10-CM | POA: Diagnosis present

## 2020-11-24 DIAGNOSIS — R69 Illness, unspecified: Secondary | ICD-10-CM | POA: Diagnosis not present

## 2020-11-24 DIAGNOSIS — E876 Hypokalemia: Secondary | ICD-10-CM | POA: Diagnosis not present

## 2020-11-24 DIAGNOSIS — R7401 Elevation of levels of liver transaminase levels: Secondary | ICD-10-CM | POA: Insufficient documentation

## 2020-11-24 DIAGNOSIS — S0500XA Injury of conjunctiva and corneal abrasion without foreign body, unspecified eye, initial encounter: Secondary | ICD-10-CM | POA: Diagnosis present

## 2020-11-24 DIAGNOSIS — G43901 Migraine, unspecified, not intractable, with status migrainosus: Secondary | ICD-10-CM | POA: Insufficient documentation

## 2020-11-24 DIAGNOSIS — I10 Essential (primary) hypertension: Secondary | ICD-10-CM | POA: Insufficient documentation

## 2020-11-24 DIAGNOSIS — R718 Other abnormality of red blood cells: Secondary | ICD-10-CM | POA: Diagnosis present

## 2020-11-24 DIAGNOSIS — G43001 Migraine without aura, not intractable, with status migrainosus: Secondary | ICD-10-CM | POA: Diagnosis not present

## 2020-11-24 DIAGNOSIS — H539 Unspecified visual disturbance: Secondary | ICD-10-CM

## 2020-11-24 DIAGNOSIS — F17210 Nicotine dependence, cigarettes, uncomplicated: Secondary | ICD-10-CM | POA: Insufficient documentation

## 2020-11-24 DIAGNOSIS — Z20822 Contact with and (suspected) exposure to covid-19: Secondary | ICD-10-CM | POA: Diagnosis not present

## 2020-11-24 DIAGNOSIS — I161 Hypertensive emergency: Secondary | ICD-10-CM | POA: Diagnosis not present

## 2020-11-24 DIAGNOSIS — X58XXXA Exposure to other specified factors, initial encounter: Secondary | ICD-10-CM | POA: Diagnosis not present

## 2020-11-24 DIAGNOSIS — R748 Abnormal levels of other serum enzymes: Secondary | ICD-10-CM | POA: Diagnosis present

## 2020-11-24 DIAGNOSIS — Z79899 Other long term (current) drug therapy: Secondary | ICD-10-CM | POA: Insufficient documentation

## 2020-11-24 DIAGNOSIS — S058X1A Other injuries of right eye and orbit, initial encounter: Secondary | ICD-10-CM | POA: Diagnosis present

## 2020-11-24 DIAGNOSIS — G43111 Migraine with aura, intractable, with status migrainosus: Secondary | ICD-10-CM

## 2020-11-24 LAB — HCG, SERUM, QUALITATIVE: Preg, Serum: NEGATIVE

## 2020-11-24 MED ORDER — SODIUM CHLORIDE 0.9 % IV SOLN
12.5000 mg | Freq: Four times a day (QID) | INTRAVENOUS | Status: DC | PRN
  Filled 2020-11-24: qty 0.5

## 2020-11-24 MED ORDER — DIPHENHYDRAMINE HCL 50 MG/ML IJ SOLN
25.0000 mg | Freq: Once | INTRAMUSCULAR | Status: AC
  Administered 2020-11-24: 25 mg via INTRAVENOUS
  Filled 2020-11-24: qty 1

## 2020-11-24 MED ORDER — KETOROLAC TROMETHAMINE 30 MG/ML IJ SOLN
30.0000 mg | Freq: Once | INTRAMUSCULAR | Status: AC
  Administered 2020-11-24: 30 mg via INTRAVENOUS
  Filled 2020-11-24: qty 1

## 2020-11-24 MED ORDER — TETRACAINE HCL 0.5 % OP SOLN
2.0000 [drp] | Freq: Once | OPHTHALMIC | Status: AC
  Administered 2020-11-24: 2 [drp] via OPHTHALMIC
  Filled 2020-11-24: qty 4

## 2020-11-24 MED ORDER — FLUORESCEIN SODIUM 1 MG OP STRP
1.0000 | ORAL_STRIP | Freq: Once | OPHTHALMIC | Status: AC
  Administered 2020-11-24: 1 via OPHTHALMIC
  Filled 2020-11-24: qty 1

## 2020-11-24 MED ORDER — SODIUM CHLORIDE 0.9 % IV BOLUS
1000.0000 mL | Freq: Once | INTRAVENOUS | Status: AC
  Administered 2020-11-24: 1000 mL via INTRAVENOUS

## 2020-11-24 NOTE — ED Triage Notes (Addendum)
Pt has had and intermittent headache for a few weeks but states this evening she started to have right sided facial pain, right mouth pain and a right "pressure headache". Pt states she has a history of migraines and has taken her prescribed meds with minimal relief of sx.

## 2020-11-24 NOTE — ED Provider Notes (Signed)
[facility] EMERGENCY DEPT Provider Note   CSN: [id number] Arrival date & time: 11/24/20  2059     History Chief Complaint  Patient presents with   Headache    [name] is a 45 y.o. female.  Patient states she was diagnosed with migraines in the past but has not seen a neurologist for several years.  Over the past 2 to 3 weeks she has had a constant right-sided headache that is progressively worsening.  Headache is associated with photophobia, phonophobia, nausea, vomiting and "weird sensation" to the right side of her face.  He states the right face feels like it is "leaking something" but she knows it is not actually leaking.  Describes decreased sensation to the right side of her face without any facial droop.  No difficulty speaking or difficulty swallowing.  No focal weakness in her arms or legs.  No chest pain or shortness of breath.  She saw her PCP and she was prescribed Nurtec which is not helping.  She had been on Maxalt in the past which has not helped either.  Was also given a course of steroids which she is about done with. Endorses having blurry vision at times and occasionally seeing spots and pain with eye movement. She comes in tonight because the headache is progressively worsening.  She endorses a gradual onset denies thunderclap.  Denies any sudden worsening of the headache.  But continues to have weird sensation of the right side of her face.  The history is provided by the patient.  Headache Associated symptoms: nausea, numbness, photophobia and vomiting   Associated symptoms: no abdominal pain, no dizziness, no fever and no myalgias       Past Medical History:  Diagnosis Date   Adnexal cyst 2013   right   Anemia 2015   Pernicious and iron deficiency. As of 2015/2016 no longer requiring po iron or B12 shots.    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 arthroscopy x 2.    Colitis    Headache(784.0)    migraines   Hydradenitis 2001   axillary,  multiple surgical I & Ds/excisions.   Hypertension    Nausea & vomiting 11/28/2014   Obesity (BMI 30.0-34.9)    Pilonidal cyst    Pityriasis rosea    pt also gives hx of seborrheic dermatitis.     Patient Active Problem List   Diagnosis Date Noted   Migraine 11/15/2020   Elevated liver enzymes 11/02/2020   Peripheral neuropathy 10/11/2020   Accidental drug overdose 08/18/2019   Suicide attempt ([place]) 08/17/2019   Irritable bowel syndrome with diarrhea 02/02/2018   Generalized anxiety disorder 02/02/2018   Perirectal abscess 10/28/2016   Chronic fatigue 01/09/2016   Adrenal nodule ([place]) 01/09/2016   Chronic low back pain 11/21/2015   Benign essential hypertension 08/13/2015   Elevated lipase 12/14/2014   AP (abdominal pain) 12/14/2014   Nausea and vomiting 11/29/2014   Intractable abdominal pain 11/28/2014   Hypokalemia 11/28/2014   HTN (hypertension) 11/28/2014   Pilonidal abscess 12/03/2013   Patellar instability of left knee 06/08/2013    Past Surgical History:  Procedure Laterality Date   ABDOMINAL HYSTERECTOMY     CYSTECTOMY     2004 axilla bil   ESOPHAGOGASTRODUODENOSCOPY N/A 11/30/2014   Procedure: ESOPHAGOGASTRODUODENOSCOPY (EGD);  Surgeon: Jerene Bears, MD;  Location: [facility] ENDOSCOPY;  Service: Endoscopy;  Laterality: N/A;   FRACTURE SURGERY     rt arm   GASTRIC BYPASS  2011   gastric sleeve  KNEE SURGERY  2005   left   LAPAROSCOPIC CHOLECYSTECTOMY  2002   MEDIAL PATELLOFEMORAL LIGAMENT REPAIR Left 06/08/2013   Procedure: DIAGNOSTIC OPERATIVE ARTHROSCOPY, OPEN MEDIAL PATELLA FEMORAL LIGAMENT RECONSTRUCTION;  Surgeon: Meredith Pel, MD;  Location: [facility];  Service: Orthopedics;  Laterality: Left;  with Hamstring autograft   OVARIAN CYST REMOVAL     PARTIAL HYSTERECTOMY     R arm surgery       OB History   No obstetric history on file.     Family History  Problem Relation Age of Onset   Lupus Mother    Stroke Mother    Hypertension Mother    Crohn's  disease Father    Hypertension Father    Gallbladder disease Father    Cancer Maternal Grandfather        lung   COPD Maternal Grandfather    Emphysema Maternal Grandfather    Lung cancer Maternal Grandmother    Stroke Maternal Grandmother    Coronary artery disease Paternal Grandfather     Social History   Tobacco Use   Smoking status: Every Day    Packs/day: 0.50    Years: 10.00    Pack years: 5.00    Types: Cigarettes    Start date: 05/20/2013   Smokeless tobacco: Never  Vaping Use   Vaping Use: Never used  Substance Use Topics   Alcohol use: Yes    Alcohol/week: 1.0 - 2.0 standard drink    Types: 1 - 2 Standard drinks or equivalent per week    Comment: occ   Drug use: Yes    Types: Other-see comments    Comment: CBD rub    Home Medications Prior to Admission medications   Medication Sig Start Date End Date Taking? Authorizing Provider  cyclobenzaprine (FLEXERIL) 10 MG tablet Take 1 tablet (10 mg total) by mouth 3 (three) times daily as needed for muscle spasms. 10/05/20   Luetta Nutting, DO  dicyclomine (BENTYL) 10 MG capsule Take 1 capsule (10 mg total) by mouth 4 (four) times daily -  before meals and at bedtime. 10/05/20   Luetta Nutting, DO  gabapentin (NEURONTIN) 300 MG capsule Take 1 capsule (300 mg total) by mouth 3 (three) times daily. Take 375m qhs x1 week then may increase to 308mTID 10/05/20   MaLuetta NuttingDO  ondansetron (ZOFRAN) 4 MG tablet Take 1 tablet (4 mg total) by mouth every 8 (eight) hours as needed for nausea or vomiting. 11/13/20   BuMar DaringPA-C  predniSONE (DELTASONE) 20 MG tablet Take 2 tablets (40 mg total) by mouth daily with breakfast. 11/13/20   BuMar DaringPA-C  Rimegepant Sulfate (NURTEC) 75 MG TBDP Take 75 mg by mouth daily as needed (migraine). 11/15/20   MaLuetta NuttingDO  rizatriptan (MAXALT) 10 MG tablet Take 1 tablet (10 mg total) by mouth once for 1 dose. May repeat in 2 hours if needed 11/10/20 11/10/20  MaBrunetta JeansPA-C  traZODone (DESYREL) 100 MG tablet Take 1 tablet (100 mg total) by mouth at bedtime as needed for sleep. 10/05/20   MaLuetta NuttingDO  vitamin B-12 (CYANOCOBALAMIN) 1000 MCG tablet Take 1,000 mcg by mouth daily.    [provider]  Vitamin D, Ergocalciferol, (DRISDOL) 1.25 MG (50000 UNIT) CAPS capsule Take 1 capsule (50,000 Units total) by mouth every 7 (seven) days. 10/13/20   MaLuetta NuttingDO    Allergies    Penicillins  Review of Systems   Review  of Systems  Constitutional:  Negative for activity change, appetite change and fever.  Eyes:  Positive for photophobia and visual disturbance.  Respiratory:  Negative for chest tightness and shortness of breath.   Cardiovascular:  Negative for chest pain.  Gastrointestinal:  Positive for nausea and vomiting. Negative for abdominal pain.  Genitourinary:  Negative for dysuria and hematuria.  Musculoskeletal:  Negative for arthralgias and myalgias.  Skin:  Negative for wound.  Neurological:  Positive for numbness and headaches. Negative for dizziness and light-headedness.   all other systems are negative except as noted in the HPI and PMH.   Physical Exam Updated Vital Signs BP (!) 181/101   Pulse 69   Temp 98.6 F (37 C) (Oral)   Resp 18   Ht 5' 11" (1.803 m)   Wt 78.9 kg   SpO2 97%   BMI 24.27 kg/m   Physical Exam Vitals and nursing note reviewed.  Constitutional:      General: She is not in acute distress.    Appearance: She is well-developed.     Comments: Uncomfortable  HENT:     Head: Normocephalic and atraumatic.     Ears:     Comments: No temporal artery tenderness    Mouth/Throat:     Pharynx: No oropharyngeal exudate.  Eyes:     Intraocular pressure: Right eye pressure is 11 mmHg. Measurements were taken using a handheld tonometer.    Extraocular Movements: Extraocular movements intact.     Conjunctiva/sclera: Conjunctivae normal.     Pupils: Pupils are equal, round, and reactive to  light.      Comments: Fluorescein uptake as depicted.  Seidel sign negative.  No hyphema or hypopyon.  Intraocular pressure 10-11.  Neck:     Comments: No meningismus. photophobia Cardiovascular:     Rate and Rhythm: Normal rate and regular rhythm.     Heart sounds: Normal heart sounds. No murmur heard. Pulmonary:     Effort: Pulmonary effort is normal. No respiratory distress.     Breath sounds: Normal breath sounds.  Abdominal:     Palpations: Abdomen is soft.     Tenderness: There is no abdominal tenderness. There is no guarding or rebound.  Musculoskeletal:        General: No tenderness. Normal range of motion.     Cervical back: Normal range of motion and neck supple.  Skin:    General: Skin is warm.  Neurological:     Mental Status: She is alert and oriented to person, place, and time.     Cranial Nerves: No cranial nerve deficit.     Motor: No abnormal muscle tone.     Coordination: Coordination normal.     Comments: CN 2-12 intact, no ataxia on finger to nose, no nystagmus, 5/5 strength throughout, no pronator drift, Romberg negative, normal gait. Subjective paresthesias to right side of the face and right arm.  States her right leg feels different but she has neuropathy and this is chronic.  There is no weakness in her arms or legs.  No facial droop.  Tongue is midline.  No weakness with eyebrow raise.  Psychiatric:        Behavior: Behavior normal.    ED Results / Procedures / Treatments   Labs (all labs ordered are listed, but only abnormal results are displayed) Labs Reviewed  CBC WITH DIFFERENTIAL/PLATELET - Abnormal; Notable for the following components:      Result Value   MCV 101.0 (*)    All  other components within normal limits  COMPREHENSIVE METABOLIC PANEL - Abnormal; Notable for the following components:   Potassium 3.3 (*)    Calcium 8.7 (*)    Total Protein 5.7 (*)    Albumin 3.1 (*)    Alkaline Phosphatase 180 (*)    All other components within  normal limits  RESP PANEL BY RT-PCR (FLU A&B, COVID) ARPGX2  SEDIMENTATION RATE  HCG, QUANTITATIVE, PREGNANCY  HCG, SERUM, QUALITATIVE    EKG None  Radiology CT Angio Head W or Wo Contrast  Result Date: 11/25/2020 CLINICAL DATA:  Initial evaluation for neuro deficit, stroke suspected. EXAM: CT ANGIOGRAPHY HEAD AND NECK TECHNIQUE: Multidetector CT imaging of the head and neck was performed using the standard protocol during bolus administration of intravenous contrast. Multiplanar CT image reconstructions and MIPs were obtained to evaluate the vascular anatomy. Carotid stenosis measurements (when applicable) are obtained utilizing NASCET criteria, using the distal internal carotid diameter as the denominator. CONTRAST:  21m OMNIPAQUE IOHEXOL 350 MG/ML SOLN COMPARISON:  Prior study from 08/15/2019. FINDINGS: CT HEAD FINDINGS Brain: Cerebral volume within normal limits for patient age. No evidence for acute intracranial hemorrhage. No findings to suggest acute large vessel territory infarct. No mass lesion, midline shift, or mass effect. Ventricles are normal in size without evidence for hydrocephalus. No extra-axial fluid collection identified. Vascular: No hyperdense vessel identified. Skull: Scalp soft tissues demonstrate no acute abnormality. Calvarium intact. Sinuses/Orbits: Globes and orbital soft tissues within normal limits. Visualized paranasal sinuses are clear. No mastoid effusion. CTA NECK FINDINGS Aortic arch: Visualized aortic arch of normal caliber with normal branch pattern. No stenosis or other abnormality about the origin of the great vessels. Right carotid system: Right common and internal carotid arteries widely patent without stenosis, dissection or occlusion. Left carotid system: Left common and internal carotid arteries widely patent without stenosis, dissection or occlusion. Vertebral arteries: Both vertebral arteries arise from the subclavian arteries. No proximal subclavian artery  stenosis. Both vertebral arteries widely patent without stenosis, dissection or occlusion. Skeleton: No visible acute osseous finding. No discrete osseous lesions. Patient is edentulous. Other neck: No other acute soft tissue abnormality within the neck. Upper chest: Mild atelectatic changes noted dependently within the visualized left upper lobe. Visualized upper chest demonstrates no other acute finding. Review of the MIP images confirms the above findings CTA HEAD FINDINGS Anterior circulation: Both internal carotid arteries widely patent to the termini without stenosis. A1 segments widely patent. Normal anterior communicating artery complex. Both anterior cerebral arteries widely patent to their distal aspects without stenosis. No M1 stenosis or occlusion. Normal MCA bifurcations. Distal MCA branches well perfused and symmetric. Posterior circulation: Both V4 segments patent to the vertebrobasilar junction without stenosis. Both PICA origins patent and normal. Basilar widely patent to its distal aspect without stenosis. Superior cerebellar arteries patent bilaterally. Both PCAs supplied via the basilar as well as small bilateral posterior communicating arteries. PCAs widely patent to their distal aspects. Venous sinuses: Grossly patent retirement hydrocephalus. Anatomic variants: None significant.  No aneurysm. Review of the MIP images confirms the above findings IMPRESSION: 1. Normal CTA of the head and neck. No large vessel occlusion, hemodynamically significant stenosis, or other acute vascular abnormality. 2. No other acute intracranial abnormality identified. Electronically Signed   By: BJeannine BogaM.D.   On: 11/25/2020 02:25   CT Angio Neck W and/or Wo Contrast  Result Date: 11/25/2020 CLINICAL DATA:  Initial evaluation for neuro deficit, stroke suspected. EXAM: CT ANGIOGRAPHY HEAD AND NECK TECHNIQUE: Multidetector CT  imaging of the head and neck was performed using the standard protocol during  bolus administration of intravenous contrast. Multiplanar CT image reconstructions and MIPs were obtained to evaluate the vascular anatomy. Carotid stenosis measurements (when applicable) are obtained utilizing NASCET criteria, using the distal internal carotid diameter as the denominator. CONTRAST:  38m OMNIPAQUE IOHEXOL 350 MG/ML SOLN COMPARISON:  Prior study from 08/15/2019. FINDINGS: CT HEAD FINDINGS Brain: Cerebral volume within normal limits for patient age. No evidence for acute intracranial hemorrhage. No findings to suggest acute large vessel territory infarct. No mass lesion, midline shift, or mass effect. Ventricles are normal in size without evidence for hydrocephalus. No extra-axial fluid collection identified. Vascular: No hyperdense vessel identified. Skull: Scalp soft tissues demonstrate no acute abnormality. Calvarium intact. Sinuses/Orbits: Globes and orbital soft tissues within normal limits. Visualized paranasal sinuses are clear. No mastoid effusion. CTA NECK FINDINGS Aortic arch: Visualized aortic arch of normal caliber with normal branch pattern. No stenosis or other abnormality about the origin of the great vessels. Right carotid system: Right common and internal carotid arteries widely patent without stenosis, dissection or occlusion. Left carotid system: Left common and internal carotid arteries widely patent without stenosis, dissection or occlusion. Vertebral arteries: Both vertebral arteries arise from the subclavian arteries. No proximal subclavian artery stenosis. Both vertebral arteries widely patent without stenosis, dissection or occlusion. Skeleton: No visible acute osseous finding. No discrete osseous lesions. Patient is edentulous. Other neck: No other acute soft tissue abnormality within the neck. Upper chest: Mild atelectatic changes noted dependently within the visualized left upper lobe. Visualized upper chest demonstrates no other acute finding. Review of the MIP images  confirms the above findings CTA HEAD FINDINGS Anterior circulation: Both internal carotid arteries widely patent to the termini without stenosis. A1 segments widely patent. Normal anterior communicating artery complex. Both anterior cerebral arteries widely patent to their distal aspects without stenosis. No M1 stenosis or occlusion. Normal MCA bifurcations. Distal MCA branches well perfused and symmetric. Posterior circulation: Both V4 segments patent to the vertebrobasilar junction without stenosis. Both PICA origins patent and normal. Basilar widely patent to its distal aspect without stenosis. Superior cerebellar arteries patent bilaterally. Both PCAs supplied via the basilar as well as small bilateral posterior communicating arteries. PCAs widely patent to their distal aspects. Venous sinuses: Grossly patent retirement hydrocephalus. Anatomic variants: None significant.  No aneurysm. Review of the MIP images confirms the above findings IMPRESSION: 1. Normal CTA of the head and neck. No large vessel occlusion, hemodynamically significant stenosis, or other acute vascular abnormality. 2. No other acute intracranial abnormality identified. Electronically Signed   By: BJeannine BogaM.D.   On: 11/25/2020 02:25    Procedures Procedures   Medications Ordered in ED Medications  sodium chloride 0.9 % bolus 1,000 mL (has no administration in time range)  fluorescein ophthalmic strip 1 strip (has no administration in time range)  tetracaine (PONTOCAINE) 0.5 % ophthalmic solution 2 drop (has no administration in time range)  ketorolac (TORADOL) 30 MG/ML injection 30 mg (has no administration in time range)  promethazine (PHENERGAN) 12.5 mg in sodium chloride 0.9 % 50 mL IVPB (has no administration in time range)  diphenhydrAMINE (BENADRYL) injection 25 mg (has no administration in time range)    ED Course  I have reviewed the triage vital signs and the nursing notes.  Pertinent labs & imaging  results that were available during my care of the patient were reviewed by me and considered in my medical decision making (see chart for  details).    MDM Rules/Calculators/A&P                         2 weeks of headache with right-sided facial paresthesias, nausea, vomiting, photophobia.  Diagnosed with possible migraines in the past.  She has no neurological deficits other than some right-sided paresthesias. Intermittent visual changes with blurred vision but not currently. NIHSS 1. Code stroke not activated due to low NIH as well as recurrent symptoms.   Intraocular pressure is normal.  Does have a corneal abrasion which may be contributing to her symptoms.  No temporal artery tenderness though temporal arteritis is considered.  Will check labs including ESR.  Labs are reassuring.  ESR is normal.  Low suspicion for temporal arteritis.  CTA is negative.  Suspect likely status migrainosus with patient also has corneal abrasion which may be contributing to her symptoms. She has been seen by neurology Dr. Barrington Ellison feels this is likely migrainous as well but does recommend patient to have MRI within the next 24 hours as right-sided numbness and visual changes are new.  Steroids and Depakote given per neurology recommendations.  MRI not available at this facility.  Discussed with patient ED to ED transfer versus admission if she is still having severe intractable headaches.  She prefers admission as she still feels like she would not be able to go home due to severe headache and nausea. Would also benefit from inpatient neurology evaluation.  Discussed with Dr. Hal Hope. Final Clinical Impression(s) / ED Diagnoses Final diagnoses:  Status migrainosus  Abrasion of right cornea, initial encounter    Rx / DC Orders ED Discharge Orders     None        Yvette Roark, Annie Main, MD 11/25/20 [phone]

## 2020-11-25 ENCOUNTER — Observation Stay (HOSPITAL_COMMUNITY): Payer: 59

## 2020-11-25 ENCOUNTER — Emergency Department (HOSPITAL_BASED_OUTPATIENT_CLINIC_OR_DEPARTMENT_OTHER): Payer: 59

## 2020-11-25 ENCOUNTER — Encounter (HOSPITAL_BASED_OUTPATIENT_CLINIC_OR_DEPARTMENT_OTHER): Payer: Self-pay | Admitting: Radiology

## 2020-11-25 DIAGNOSIS — R718 Other abnormality of red blood cells: Secondary | ICD-10-CM

## 2020-11-25 DIAGNOSIS — I161 Hypertensive emergency: Secondary | ICD-10-CM | POA: Diagnosis present

## 2020-11-25 DIAGNOSIS — G43011 Migraine without aura, intractable, with status migrainosus: Secondary | ICD-10-CM

## 2020-11-25 DIAGNOSIS — I10 Essential (primary) hypertension: Secondary | ICD-10-CM | POA: Diagnosis not present

## 2020-11-25 DIAGNOSIS — S0500XA Injury of conjunctiva and corneal abrasion without foreign body, unspecified eye, initial encounter: Secondary | ICD-10-CM | POA: Diagnosis present

## 2020-11-25 DIAGNOSIS — Z79899 Other long term (current) drug therapy: Secondary | ICD-10-CM | POA: Diagnosis not present

## 2020-11-25 DIAGNOSIS — R7401 Elevation of levels of liver transaminase levels: Secondary | ICD-10-CM | POA: Diagnosis not present

## 2020-11-25 DIAGNOSIS — R748 Abnormal levels of other serum enzymes: Secondary | ICD-10-CM | POA: Diagnosis not present

## 2020-11-25 DIAGNOSIS — S058X1A Other injuries of right eye and orbit, initial encounter: Secondary | ICD-10-CM | POA: Diagnosis present

## 2020-11-25 DIAGNOSIS — S0501XA Injury of conjunctiva and corneal abrasion without foreign body, right eye, initial encounter: Secondary | ICD-10-CM | POA: Diagnosis not present

## 2020-11-25 DIAGNOSIS — E876 Hypokalemia: Secondary | ICD-10-CM

## 2020-11-25 DIAGNOSIS — R202 Paresthesia of skin: Secondary | ICD-10-CM | POA: Diagnosis not present

## 2020-11-25 DIAGNOSIS — F17210 Nicotine dependence, cigarettes, uncomplicated: Secondary | ICD-10-CM | POA: Diagnosis not present

## 2020-11-25 DIAGNOSIS — R69 Illness, unspecified: Secondary | ICD-10-CM | POA: Diagnosis not present

## 2020-11-25 DIAGNOSIS — H539 Unspecified visual disturbance: Secondary | ICD-10-CM

## 2020-11-25 DIAGNOSIS — R2243 Localized swelling, mass and lump, lower limb, bilateral: Secondary | ICD-10-CM | POA: Diagnosis not present

## 2020-11-25 DIAGNOSIS — G43901 Migraine, unspecified, not intractable, with status migrainosus: Secondary | ICD-10-CM | POA: Diagnosis not present

## 2020-11-25 DIAGNOSIS — X58XXXA Exposure to other specified factors, initial encounter: Secondary | ICD-10-CM | POA: Diagnosis not present

## 2020-11-25 DIAGNOSIS — Z20822 Contact with and (suspected) exposure to covid-19: Secondary | ICD-10-CM | POA: Diagnosis not present

## 2020-11-25 DIAGNOSIS — R29818 Other symptoms and signs involving the nervous system: Secondary | ICD-10-CM | POA: Diagnosis not present

## 2020-11-25 LAB — CBC WITH DIFFERENTIAL/PLATELET
Abs Immature Granulocytes: 0.01 10*3/uL (ref 0.00–0.07)
Basophils Absolute: 0 10*3/uL (ref 0.0–0.1)
Basophils Relative: 1 %
Eosinophils Absolute: 0.1 10*3/uL (ref 0.0–0.5)
Eosinophils Relative: 2 %
HCT: 40.9 % (ref 36.0–46.0)
Hemoglobin: 13.5 g/dL (ref 12.0–15.0)
Immature Granulocytes: 0 %
Lymphocytes Relative: 44 %
Lymphs Abs: 2.8 10*3/uL (ref 0.7–4.0)
MCH: 33.3 pg (ref 26.0–34.0)
MCHC: 33 g/dL (ref 30.0–36.0)
MCV: 101 fL — ABNORMAL HIGH (ref 80.0–100.0)
Monocytes Absolute: 0.3 10*3/uL (ref 0.1–1.0)
Monocytes Relative: 5 %
Neutro Abs: 3.2 10*3/uL (ref 1.7–7.7)
Neutrophils Relative %: 48 %
Platelets: 231 10*3/uL (ref 150–400)
RBC: 4.05 MIL/uL (ref 3.87–5.11)
RDW: 13.5 % (ref 11.5–15.5)
WBC: 6.5 10*3/uL (ref 4.0–10.5)
nRBC: 0 % (ref 0.0–0.2)

## 2020-11-25 LAB — COMPREHENSIVE METABOLIC PANEL
ALT: 23 U/L (ref 0–44)
AST: 23 U/L (ref 15–41)
Albumin: 3.1 g/dL — ABNORMAL LOW (ref 3.5–5.0)
Alkaline Phosphatase: 180 U/L — ABNORMAL HIGH (ref 38–126)
Anion gap: 7 (ref 5–15)
BUN: 8 mg/dL (ref 6–20)
CO2: 28 mmol/L (ref 22–32)
Calcium: 8.7 mg/dL — ABNORMAL LOW (ref 8.9–10.3)
Chloride: 110 mmol/L (ref 98–111)
Creatinine, Ser: 0.63 mg/dL (ref 0.44–1.00)
GFR, Estimated: >60 mL/min (ref >60)
Glucose, Bld: 93 mg/dL (ref 70–99)
Potassium: 3.3 mmol/L — ABNORMAL LOW (ref 3.5–5.1)
Sodium: 145 mmol/L (ref 135–145)
Total Bilirubin: 0.4 mg/dL (ref 0.3–1.2)
Total Protein: 5.7 g/dL — ABNORMAL LOW (ref 6.5–8.1)

## 2020-11-25 LAB — HIV ANTIBODY (ROUTINE TESTING W REFLEX): HIV Screen 4th Generation wRfx: NONREACTIVE

## 2020-11-25 LAB — RESP PANEL BY RT-PCR (FLU A&B, COVID) ARPGX2
Influenza A by PCR: NEGATIVE
Influenza B by PCR: NEGATIVE
SARS Coronavirus 2 by RT PCR: NEGATIVE

## 2020-11-25 LAB — MAGNESIUM: Magnesium: 1.8 mg/dL (ref 1.7–2.4)

## 2020-11-25 LAB — HCG, QUANTITATIVE, PREGNANCY: hCG, Beta Chain, Quant, S: 1 m[IU]/mL (ref <5)

## 2020-11-25 LAB — TSH: TSH: 0.562 u[IU]/mL (ref 0.350–4.500)

## 2020-11-25 LAB — VITAMIN B12: Vitamin B-12: 221 pg/mL (ref 180–914)

## 2020-11-25 LAB — FOLATE: Folate: 8.8 ng/mL (ref >5.9)

## 2020-11-25 LAB — SEDIMENTATION RATE: Sed Rate: 7 mm/hr (ref 0–22)

## 2020-11-25 MED ORDER — AMITRIPTYLINE HCL 25 MG PO TABS: 25.0000 mg | ORAL_TABLET | Freq: Every day | ORAL | Status: DC

## 2020-11-25 MED ORDER — DEXAMETHASONE SODIUM PHOSPHATE 10 MG/ML IJ SOLN
10.0000 mg | Freq: Once | INTRAMUSCULAR | Status: AC
  Administered 2020-11-25: 10 mg via INTRAVENOUS
  Filled 2020-11-25: qty 1

## 2020-11-25 MED ORDER — IOHEXOL 350 MG/ML SOLN
75.0000 mL | Freq: Once | INTRAVENOUS | Status: AC | PRN
  Administered 2020-11-25: 75 mL via INTRAVENOUS

## 2020-11-25 MED ORDER — LORAZEPAM 2 MG/ML IJ SOLN
1.0000 mg | Freq: Once | INTRAMUSCULAR | Status: AC
  Administered 2020-11-25: 1 mg via INTRAVENOUS
  Filled 2020-11-25: qty 1

## 2020-11-25 MED ORDER — STROKE: EARLY STAGES OF RECOVERY BOOK
Freq: Once | Status: AC
Start: 2020-11-25 — End: 2020-11-25
  Filled 2020-11-25: qty 1

## 2020-11-25 MED ORDER — ENOXAPARIN SODIUM 40 MG/0.4ML IJ SOSY
40.0000 mg | PREFILLED_SYRINGE | INTRAMUSCULAR | Status: DC
  Administered 2020-11-25: 40 mg via SUBCUTANEOUS
  Filled 2020-11-25: qty 0.4

## 2020-11-25 MED ORDER — OFLOXACIN 0.3 % OP SOLN: 1.0000 [drp] | Freq: Four times a day (QID) | OPHTHALMIC | 0 refills | Status: DC

## 2020-11-25 MED ORDER — VALPROATE SODIUM 100 MG/ML IV SOLN
750.0000 mg | Freq: Once | INTRAVENOUS | Status: AC
  Administered 2020-11-25: 750 mg via INTRAVENOUS
  Filled 2020-11-25: qty 7.5

## 2020-11-25 MED ORDER — LORAZEPAM 2 MG/ML IJ SOLN: 0.5000 mg | Freq: Once | INTRAMUSCULAR | Status: DC | PRN

## 2020-11-25 MED ORDER — ACETAMINOPHEN 650 MG RE SUPP: 650.0000 mg | RECTAL | Status: DC | PRN

## 2020-11-25 MED ORDER — GADOBUTROL 1 MMOL/ML IV SOLN
7.5000 mL | Freq: Once | INTRAVENOUS | Status: AC | PRN
  Administered 2020-11-25: 7.5 mL via INTRAVENOUS

## 2020-11-25 MED ORDER — AMLODIPINE BESYLATE 10 MG PO TABS
10.0000 mg | ORAL_TABLET | Freq: Every day | ORAL | Status: DC
  Administered 2020-11-25: 10 mg via ORAL
  Filled 2020-11-25: qty 1

## 2020-11-25 MED ORDER — PROCHLORPERAZINE EDISYLATE 10 MG/2ML IJ SOLN
10.0000 mg | Freq: Once | INTRAMUSCULAR | Status: AC
  Administered 2020-11-25: 10 mg via INTRAVENOUS
  Filled 2020-11-25: qty 2

## 2020-11-25 MED ORDER — ACETAMINOPHEN 325 MG PO TABS: 650.0000 mg | ORAL_TABLET | ORAL | Status: DC | PRN

## 2020-11-25 MED ORDER — ACETAMINOPHEN 160 MG/5ML PO SOLN: 650.0000 mg | ORAL | Status: DC | PRN

## 2020-11-25 MED ORDER — BUTALBITAL-APAP-CAFFEINE 50-325-40 MG PO TABS: 1.0000 | ORAL_TABLET | Freq: Four times a day (QID) | ORAL | Status: DC | PRN

## 2020-11-25 MED ORDER — OFLOXACIN 0.3 % OP SOLN
1.0000 [drp] | Freq: Four times a day (QID) | OPHTHALMIC | Status: DC
  Administered 2020-11-25 (×2): 1 [drp] via OPHTHALMIC
  Filled 2020-11-25: qty 5

## 2020-11-25 MED ORDER — POTASSIUM CHLORIDE CRYS ER 20 MEQ PO TBCR
40.0000 meq | EXTENDED_RELEASE_TABLET | ORAL | Status: AC
  Administered 2020-11-25: 40 meq via ORAL
  Filled 2020-11-25: qty 2

## 2020-11-25 MED ORDER — OLMESARTAN MEDOXOMIL-HCTZ 40-12.5 MG PO TABS: 1.0000 | ORAL_TABLET | Freq: Every day | ORAL | 0 refills | Status: DC

## 2020-11-25 MED ORDER — AMITRIPTYLINE HCL 25 MG PO TABS: 25.0000 mg | ORAL_TABLET | Freq: Every day | ORAL | 0 refills | Status: DC

## 2020-11-25 MED ORDER — TOPIRAMATE 25 MG PO TABS: 25.0000 mg | ORAL_TABLET | Freq: Every day | ORAL | Status: DC

## 2020-11-25 MED ORDER — HYDRALAZINE HCL 20 MG/ML IJ SOLN: 10.0000 mg | INTRAMUSCULAR | Status: DC | PRN

## 2020-11-25 NOTE — H&P (Addendum)
History and Physical    [name] JKD:[id number] DOB: 20-Sep-1975 DOA: 11/24/2020  Referring MD/NP/PA: Francia Greaves, DO PCP: Luetta Nutting, DO  Patient coming from: [facility]  Chief Complaint: Headache  I have personally briefly reviewed patient's old medical records in [place]   HPI: [name] is a 45 y.o. female with medical history significant of hypertension, migraine headaches, and iron deficiency anemia who presents with complaints of right-sided headache over the last 2 to 3 weeks.  Patient reports that headache has been constant and feels like pressure.  Associated symptoms include blurry vision, sensitivity to light, sensitivity to sound, nausea, vomiting, paresthesias of the right side of the face and ringing in her ear.  She chronically has intermittent swelling legs that comes and goes and is changing.  Patient had been evaluated by her primary care provider recently and had been tried on Maxalt and Rimegepant without improvement in symptoms.  ED Course: Upon admission to the emergency department patient was noted to be afebrile, pulse 56-98, blood pressures 139/117-199/21, and all other vital signs maintained.  Labs from 7/8 significant for MCV 101, potassium 3.3, alkaline phosphatase 180, albumin 3.1, and sed rate 7.  CT angiogram of the head and neck did not note any acute large vessel occlusion or vascular abnormality.  Intraocular pressures were noted to be normal, but patient was noted to have a corneal abrasion on funduscopic evaluation by the ED provider.  Telemetry neurology was consulted and recommended MRI of the brain without contrast,  trial of Depakote 800 mg IV or dihydroergotamine 1mg  IV x 1 PLUS Zofran, and to confirm negative pregnancy test which was done.  Patient had been given 1 L normal saline IV fluids, ketorolac 30 mg IV, Benadryl 25 mg IV, Phenergan, Depakote 750 mg IV, and started on ofloxacin ophthalmic drops to the right  eye.  Review of Systems  Constitutional:  Positive for malaise/fatigue. Negative for fever.  HENT:  Positive for tinnitus. Negative for sore throat.   Eyes:  Positive for blurred vision and photophobia.  Respiratory:  Negative for cough, hemoptysis and shortness of breath.   Cardiovascular:  Positive for leg swelling. Negative for chest pain.  Gastrointestinal:  Positive for nausea and vomiting. Negative for abdominal pain.  Genitourinary:  Negative for dysuria and hematuria.  Musculoskeletal:  Negative for falls.  Skin:  Negative for itching.  Neurological:  Positive for sensory change and headaches. Negative for loss of consciousness.  Endo/Heme/Allergies:  Negative for polydipsia.  Psychiatric/Behavioral:  The patient has insomnia.    Past Medical History:  Diagnosis Date   Adnexal cyst 2013   right   Anemia 2015   Pernicious and iron deficiency. As of 2015/2016 no longer requiring po iron or B12 shots.    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 arthroscopy x 2.    Colitis    Headache(784.0)    migraines   Hydradenitis 2001   axillary, multiple surgical I & Ds/excisions.   Hypertension    Nausea & vomiting 11/28/2014   Obesity (BMI 30.0-34.9)    Pilonidal cyst    Pityriasis rosea    pt also gives hx of seborrheic dermatitis.     Past Surgical History:  Procedure Laterality Date   ABDOMINAL HYSTERECTOMY     CYSTECTOMY     2004 axilla bil   ESOPHAGOGASTRODUODENOSCOPY N/A 11/30/2014   Procedure: ESOPHAGOGASTRODUODENOSCOPY (EGD);  Surgeon: Jerene Bears, MD;  Location: [facility] ENDOSCOPY;  Service: Endoscopy;  Laterality: N/A;  FRACTURE SURGERY     rt arm   GASTRIC BYPASS  2011   gastric sleeve   KNEE SURGERY  2005   left   LAPAROSCOPIC CHOLECYSTECTOMY  2002   MEDIAL PATELLOFEMORAL LIGAMENT REPAIR Left 06/08/2013   Procedure: DIAGNOSTIC OPERATIVE ARTHROSCOPY, OPEN MEDIAL PATELLA FEMORAL LIGAMENT RECONSTRUCTION;  Surgeon: Meredith Pel, MD;  Location: [facility];  Service:  Orthopedics;  Laterality: Left;  with Hamstring autograft   OVARIAN CYST REMOVAL     PARTIAL HYSTERECTOMY     R arm surgery       reports that she has been smoking cigarettes. She started smoking about 7 years ago. She has a 5.00 pack-year smoking history. She has never used smokeless tobacco. She reports current alcohol use of about 1.0 - 2.0 standard drink of alcohol per week. She reports current drug use. Drug: Other-see comments.  Allergies  Allergen Reactions   Penicillins Anaphylaxis, Swelling and Rash    Has patient had a PCN reaction causing immediate rash, facial/tongue/throat swelling, SOB or lightheadedness with hypotension: Yes Has patient had a PCN reaction causing severe rash involving mucus membranes or skin necrosis: No Has patient had a PCN reaction that required hospitalization No Has patient had a PCN reaction occurring within the last 10 years: No If all of the above answers are "NO", then may proceed with Cephalosporin use.  Face and throat swell    Family History  Problem Relation Age of Onset   Lupus Mother    Stroke Mother    Hypertension Mother    Crohn's disease Father    Hypertension Father    Gallbladder disease Father    Cancer Maternal Grandfather        lung   COPD Maternal Grandfather    Emphysema Maternal Grandfather    Lung cancer Maternal Grandmother    Stroke Maternal Grandmother    Coronary artery disease Paternal Grandfather     Prior to Admission medications   Medication Sig Start Date End Date Taking? Authorizing Provider  cyclobenzaprine (FLEXERIL) 10 MG tablet Take 1 tablet (10 mg total) by mouth 3 (three) times daily as needed for muscle spasms. 10/05/20   Luetta Nutting, DO  dicyclomine (BENTYL) 10 MG capsule Take 1 capsule (10 mg total) by mouth 4 (four) times daily -  before meals and at bedtime. 10/05/20   Luetta Nutting, DO  gabapentin (NEURONTIN) 300 MG capsule Take 1 capsule (300 mg total) by mouth 3 (three) times daily. Take  300mg  qhs x1 week then may increase to 300mg  TID 10/05/20   Luetta Nutting, DO  ondansetron (ZOFRAN) 4 MG tablet Take 1 tablet (4 mg total) by mouth every 8 (eight) hours as needed for nausea or vomiting. 11/13/20   Mar Daring, PA-C  predniSONE (DELTASONE) 20 MG tablet Take 2 tablets (40 mg total) by mouth daily with breakfast. 11/13/20   Mar Daring, PA-C  Rimegepant Sulfate (NURTEC) 75 MG TBDP Take 75 mg by mouth daily as needed (migraine). 11/15/20   Luetta Nutting, DO  rizatriptan (MAXALT) 10 MG tablet Take 1 tablet (10 mg total) by mouth once for 1 dose. May repeat in 2 hours if needed 11/10/20 11/10/20  Brunetta Jeans, PA-C  traZODone (DESYREL) 100 MG tablet Take 1 tablet (100 mg total) by mouth at bedtime as needed for sleep. 10/05/20   Luetta Nutting, DO  vitamin B-12 (CYANOCOBALAMIN) 1000 MCG tablet Take 1,000 mcg by mouth daily.    [provider]  Vitamin D, Ergocalciferol, (  DRISDOL) 1.25 MG (50000 UNIT) CAPS capsule Take 1 capsule (50,000 Units total) by mouth every 7 (seven) days. 10/13/20   Luetta Nutting, DO    Physical Exam:  Constitutional: NAD, calm, comfortable Vitals:   11/25/20 0226 11/25/20 0330 11/25/20 0400 11/25/20 0533  BP: (!) 139/117 (!) 152/90 (!) 163/96 (!) 177/97  Pulse: 75 61 (!) 57 (!) 56  Resp: 16 17  16   Temp:    98.1 F (36.7 C)  TempSrc:    Oral  SpO2: 98% 95% 98% 100%  Weight:    79.6 kg  Height:    5\' 11"  (1.803 m)   Eyes: PERRL, lids and conjunctivae normal ENMT: Mucous membranes are moist. Posterior pharynx clear of any exudate or lesions.Normal dentition.  Neck: normal, supple, no masses, no thyromegaly Respiratory: clear to auscultation bilaterally, no wheezing, no crackles. Normal respiratory effort. No accessory muscle use.  Cardiovascular: Regular rate and rhythm, no murmurs / rubs / gallops. No extremity edema. 2+ pedal pulses. No carotid bruits.  Abdomen: no tenderness, no masses palpated. No hepatosplenomegaly.  Bowel sounds positive.  Musculoskeletal: no clubbing / cyanosis. No joint deformity upper and lower extremities. Good ROM, no contractures. Normal muscle tone.  Skin: no rashes, lesions, ulcers. No induration Neurologic: CN 2-12 grossly intact. Sensation intact, DTR normal. Strength 5/5 in all 4.  Psychiatric: Normal judgment and insight. Alert and oriented x 3. Normal mood.     Labs on Admission: I have personally reviewed following labs and imaging studies  CBC: Recent Labs  Lab 11/24/20 2313  WBC 6.5  NEUTROABS 3.2  HGB 13.5  HCT 40.9  MCV 101.0*  PLT 527   Basic Metabolic Panel: Recent Labs  Lab 11/24/20 2313  NA 145  K 3.3*  CL 110  CO2 28  GLUCOSE 93  BUN 8  CREATININE 0.63  CALCIUM 8.7*   GFR: Estimated Creatinine Clearance: 99.3 mL/min (by C-G formula based on SCr of 0.63 mg/dL). Liver Function Tests: Recent Labs  Lab 11/24/20 2313  AST 23  ALT 23  ALKPHOS 180*  BILITOT 0.4  PROT 5.7*  ALBUMIN 3.1*   No results for input(s): LIPASE, AMYLASE in the last 168 hours. No results for input(s): AMMONIA in the last 168 hours. Coagulation Profile: No results for input(s): INR, PROTIME in the last 168 hours. Cardiac Enzymes: No results for input(s): CKTOTAL, CKMB, CKMBINDEX, TROPONINI in the last 168 hours. BNP (last 3 results) No results for input(s): PROBNP in the last 8760 hours. HbA1C: No results for input(s): HGBA1C in the last 72 hours. CBG: No results for input(s): GLUCAP in the last 168 hours. Lipid Profile: No results for input(s): CHOL, HDL, LDLCALC, TRIG, CHOLHDL, LDLDIRECT in the last 72 hours. Thyroid Function Tests: No results for input(s): TSH, T4TOTAL, FREET4, T3FREE, THYROIDAB in the last 72 hours. Anemia Panel: No results for input(s): VITAMINB12, FOLATE, FERRITIN, TIBC, IRON, RETICCTPCT in the last 72 hours. Urine analysis:    Component Value Date/Time   COLORURINE AMBER (A) 08/27/2016 2037   APPEARANCEUR TURBID (A) 08/27/2016 2037    LABSPEC 1.031 (H) 08/27/2016 2037   PHURINE 5.0 08/27/2016 2037   GLUCOSEU NEGATIVE 08/27/2016 2037   HGBUR NEGATIVE 08/27/2016 2037   BILIRUBINUR SMALL (A) 08/27/2016 2037   KETONESUR 15 (A) 08/27/2016 2037   PROTEINUR NEGATIVE 08/27/2016 2037   UROBILINOGEN 1.0 11/28/2014 0001   NITRITE NEGATIVE 08/27/2016 2037   LEUKOCYTESUR SMALL (A) 08/27/2016 2037   Sepsis Labs: Recent Results (from the past 240 hour(s))  Resp Panel by RT-PCR (Flu A&B, Covid) Nasopharyngeal Swab     Status: None   Collection Time: 11/25/20  3:33 AM   Specimen: Nasopharyngeal Swab; Nasopharyngeal(NP) swabs in vial transport medium  Result Value Ref Range Status   SARS Coronavirus 2 by RT PCR NEGATIVE NEGATIVE Final    Comment: (NOTE) SARS-CoV-2 target nucleic acids are NOT DETECTED.  The SARS-CoV-2 RNA is generally detectable in upper respiratory specimens during the acute phase of infection. The lowest concentration of SARS-CoV-2 viral copies this assay can detect is 138 copies/mL. A negative result does not preclude SARS-Cov-2 infection and should not be used as the sole basis for treatment or other patient management decisions. A negative result may occur with  improper specimen collection/handling, submission of specimen other than nasopharyngeal swab, presence of viral mutation(s) within the areas targeted by this assay, and inadequate number of viral copies(<138 copies/mL). A negative result must be combined with clinical observations, patient history, and epidemiological information. The expected result is Negative.  Fact Sheet for Patients:  EntrepreneurPulse.com.au  Fact Sheet for Healthcare Providers:  IncredibleEmployment.be  This test is no t yet approved or cleared by the [place] FDA and  has been authorized for detection and/or diagnosis of SARS-CoV-2 by FDA under an Emergency Use Authorization (EUA). This EUA will remain  in effect (meaning  this test can be used) for the duration of the COVID-19 declaration under Section 564(b)(1) of the Act, 21 U.S.C.section 360bbb-3(b)(1), unless the authorization is terminated  or revoked sooner.       Influenza A by PCR NEGATIVE NEGATIVE Final   Influenza B by PCR NEGATIVE NEGATIVE Final    Comment: (NOTE) The Xpert Xpress SARS-CoV-2/FLU/RSV plus assay is intended as an aid in the diagnosis of influenza from Nasopharyngeal swab specimens and should not be used as a sole basis for treatment. Nasal washings and aspirates are unacceptable for Xpert Xpress SARS-CoV-2/FLU/RSV testing.  Fact Sheet for Patients: EntrepreneurPulse.com.au  Fact Sheet for Healthcare Providers: IncredibleEmployment.be  This test is not yet approved or cleared by the [place] FDA and has been authorized for detection and/or diagnosis of SARS-CoV-2 by FDA under an Emergency Use Authorization (EUA). This EUA will remain in effect (meaning this test can be used) for the duration of the COVID-19 declaration under Section 564(b)(1) of the Act, 21 U.S.C. section 360bbb-3(b)(1), unless the authorization is terminated or revoked.  Performed at [facility], [address], [place], [place] [address]      Radiological Exams on Admission: CT Angio Head W or Wo Contrast  Result Date: 11/25/2020 CLINICAL DATA:  Initial evaluation for neuro deficit, stroke suspected. EXAM: CT ANGIOGRAPHY HEAD AND NECK TECHNIQUE: Multidetector CT imaging of the head and neck was performed using the standard protocol during bolus administration of intravenous contrast. Multiplanar CT image reconstructions and MIPs were obtained to evaluate the vascular anatomy. Carotid stenosis measurements (when applicable) are obtained utilizing NASCET criteria, using the distal internal carotid diameter as the denominator. CONTRAST:  70mL OMNIPAQUE IOHEXOL 350 MG/ML SOLN COMPARISON:  Prior  study from 08/15/2019. FINDINGS: CT HEAD FINDINGS Brain: Cerebral volume within normal limits for patient age. No evidence for acute intracranial hemorrhage. No findings to suggest acute large vessel territory infarct. No mass lesion, midline shift, or mass effect. Ventricles are normal in size without evidence for hydrocephalus. No extra-axial fluid collection identified. Vascular: No hyperdense vessel identified. Skull: Scalp soft tissues demonstrate no acute abnormality. Calvarium intact. Sinuses/Orbits: Globes and orbital soft tissues within normal  limits. Visualized paranasal sinuses are clear. No mastoid effusion. CTA NECK FINDINGS Aortic arch: Visualized aortic arch of normal caliber with normal branch pattern. No stenosis or other abnormality about the origin of the great vessels. Right carotid system: Right common and internal carotid arteries widely patent without stenosis, dissection or occlusion. Left carotid system: Left common and internal carotid arteries widely patent without stenosis, dissection or occlusion. Vertebral arteries: Both vertebral arteries arise from the subclavian arteries. No proximal subclavian artery stenosis. Both vertebral arteries widely patent without stenosis, dissection or occlusion. Skeleton: No visible acute osseous finding. No discrete osseous lesions. Patient is edentulous. Other neck: No other acute soft tissue abnormality within the neck. Upper chest: Mild atelectatic changes noted dependently within the visualized left upper lobe. Visualized upper chest demonstrates no other acute finding. Review of the MIP images confirms the above findings CTA HEAD FINDINGS Anterior circulation: Both internal carotid arteries widely patent to the termini without stenosis. A1 segments widely patent. Normal anterior communicating artery complex. Both anterior cerebral arteries widely patent to their distal aspects without stenosis. No M1 stenosis or occlusion. Normal MCA bifurcations.  Distal MCA branches well perfused and symmetric. Posterior circulation: Both V4 segments patent to the vertebrobasilar junction without stenosis. Both PICA origins patent and normal. Basilar widely patent to its distal aspect without stenosis. Superior cerebellar arteries patent bilaterally. Both PCAs supplied via the basilar as well as small bilateral posterior communicating arteries. PCAs widely patent to their distal aspects. Venous sinuses: Grossly patent retirement hydrocephalus. Anatomic variants: None significant.  No aneurysm. Review of the MIP images confirms the above findings IMPRESSION: 1. Normal CTA of the head and neck. No large vessel occlusion, hemodynamically significant stenosis, or other acute vascular abnormality. 2. No other acute intracranial abnormality identified. Electronically Signed   By: Jeannine Boga M.D.   On: 11/25/2020 02:25   CT Angio Neck W and/or Wo Contrast  Result Date: 11/25/2020 CLINICAL DATA:  Initial evaluation for neuro deficit, stroke suspected. EXAM: CT ANGIOGRAPHY HEAD AND NECK TECHNIQUE: Multidetector CT imaging of the head and neck was performed using the standard protocol during bolus administration of intravenous contrast. Multiplanar CT image reconstructions and MIPs were obtained to evaluate the vascular anatomy. Carotid stenosis measurements (when applicable) are obtained utilizing NASCET criteria, using the distal internal carotid diameter as the denominator. CONTRAST:  59mL OMNIPAQUE IOHEXOL 350 MG/ML SOLN COMPARISON:  Prior study from 08/15/2019. FINDINGS: CT HEAD FINDINGS Brain: Cerebral volume within normal limits for patient age. No evidence for acute intracranial hemorrhage. No findings to suggest acute large vessel territory infarct. No mass lesion, midline shift, or mass effect. Ventricles are normal in size without evidence for hydrocephalus. No extra-axial fluid collection identified. Vascular: No hyperdense vessel identified. Skull: Scalp soft  tissues demonstrate no acute abnormality. Calvarium intact. Sinuses/Orbits: Globes and orbital soft tissues within normal limits. Visualized paranasal sinuses are clear. No mastoid effusion. CTA NECK FINDINGS Aortic arch: Visualized aortic arch of normal caliber with normal branch pattern. No stenosis or other abnormality about the origin of the great vessels. Right carotid system: Right common and internal carotid arteries widely patent without stenosis, dissection or occlusion. Left carotid system: Left common and internal carotid arteries widely patent without stenosis, dissection or occlusion. Vertebral arteries: Both vertebral arteries arise from the subclavian arteries. No proximal subclavian artery stenosis. Both vertebral arteries widely patent without stenosis, dissection or occlusion. Skeleton: No visible acute osseous finding. No discrete osseous lesions. Patient is edentulous. Other neck: No other acute soft tissue  abnormality within the neck. Upper chest: Mild atelectatic changes noted dependently within the visualized left upper lobe. Visualized upper chest demonstrates no other acute finding. Review of the MIP images confirms the above findings CTA HEAD FINDINGS Anterior circulation: Both internal carotid arteries widely patent to the termini without stenosis. A1 segments widely patent. Normal anterior communicating artery complex. Both anterior cerebral arteries widely patent to their distal aspects without stenosis. No M1 stenosis or occlusion. Normal MCA bifurcations. Distal MCA branches well perfused and symmetric. Posterior circulation: Both V4 segments patent to the vertebrobasilar junction without stenosis. Both PICA origins patent and normal. Basilar widely patent to its distal aspect without stenosis. Superior cerebellar arteries patent bilaterally. Both PCAs supplied via the basilar as well as small bilateral posterior communicating arteries. PCAs widely patent to their distal aspects.  Venous sinuses: Grossly patent retirement hydrocephalus. Anatomic variants: None significant.  No aneurysm. Review of the MIP images confirms the above findings IMPRESSION: 1. Normal CTA of the head and neck. No large vessel occlusion, hemodynamically significant stenosis, or other acute vascular abnormality. 2. No other acute intracranial abnormality identified. Electronically Signed   By: Jeannine Boga M.D.   On: 11/25/2020 02:25    EKG: Independently reviewed.  Sinus bradycardia at 59 bpm with QT 47  Assessment/Plan Visual change and paresthesias suspected status migrainous: Acute.  Patient presents with complaints of right-sided headache for the last 2 weeks with visual changes and complaints of paresthesias on the right side of the face.  CTA of the head and neck negative for any acute abnormalities. Differential includes migraine headache, uncontrolled hypertension, and electrolyte abnormalities.  Less likely includes temporal arteritis with normal sedimentation rate. -Admit to a medical telemetry bed -Neurochecks -Check MRI of brain without contrast -Check magnesium and TSH -Neurology consulted  Hypertensive emergency: Acute.  Blood pressures initially elevated up to 199/121 on admission.  Patient does not appear to be on any antihypertensives at baseline -Start amlodipine 10 mg daily -Hydralazine IV as needed elevated systolic blood pressures greater than 431 or diastolic blood pressures greater than 110 if MRI negative  Hypokalemia: Acute.  Potassium 3.3 on admission. -Give 40 mEq of potassium chloride p.o. -Continue to monitor and replace as needed  Corneal abrasion of right eye: Acute.  Seen on funduscopic exam by the ED provider.  This could be partly the cause for patient's blurred vision. -Continue ofloxacin drops for a total of 5 days -Recommend scheduling a follow-up appointment with an ophthalmologist within the next week  Elevated MCV: On admission MCV was noted to  be elevated at 101.  Home medications appeared to include vitamin B12. -Check vitamin B12 level  Elevated liver enzyme: Chronic.  Alkaline phosphatase noted to be 180, but has been elevated since 2019.  AST and ALT which previously were noted to be elevated were now within normal limits.  Hepatitis C testing was nonreactive. -Continue outpatient follow-up with PCP  DVT prophylaxis: Lovenox Code Status: Full Family Communication: None requested Disposition Plan: Home once medically stable Consults called: Neurology Admission status: Observation  Norval Morton MD [facility]   If 7PM-7AM, please contact night-coverage   11/25/2020, 7:23 AM

## 2020-11-25 NOTE — ED Notes (Signed)
Attempted the visual acuity with patient. She had taken her contacts out and could only see the top two lines with both eyes.

## 2020-11-25 NOTE — Consult Note (Signed)
Neurology Consultation  Reason for Consult: HA Referring Physician: Harvest Forest, MD.   CC: HA x 2 weeks.   History is obtained from: patient.   HPI: [name] is a 45 y.o. female with a PMHx of MHA, pernicious and iron deficiency anemia, HTN, and obesity s/p weight loss surgery. Patient presented to [facility] today for HA. Patient states that she has had a right sided HA with radiation to right parietal area x 2 weeks. This is same as her usual MHA. Her usual MHA is accompanied by photo/phonophobia as well. However, she came in because of her symptoms of AD tinnitis and pressure, blurred vision OD, n/v, and paraesthesias of right face.  Her PCP recently started on Nurtec by PCP and she originally took it prn, but lately everyday with some relief. Triptans did not work for her years ago. She remembers taking Topamax in the past without issue, but can not recall how it worked.   She reports increased stress with her new job, and not sleeping well.   She had tele neuro consult at [facility], then transferred to [facility] for imaging. Meds today have included Toradol 30mg  IV, Benadryl 25mg  IV, Phenergan IV, and Depakote 750mg  IV with HA down from 10/10 to 5/10. OD corneal abrasion was found by ED MD and patient has been prescribed eye gtts. She only drinks caffeine a few times a week, and has ETOH 2 times a week.She has never been to a neurologist for her MHAs.   Her BP was high upon admission and Norvasc was started by medicine. NP informed her that her HTN (urgency) was likely contributing to her HA with numbers that high. She is asking to go home because she is worried about her job.   Neurology was asked to consult for HA.   ROS: A robust ROS was performed and is negative except as noted in the HPI.   Past Medical History:  Diagnosis Date   Adnexal cyst 2013   right   Anemia 2015   Pernicious and iron deficiency. As of 2015/2016 no longer requiring po iron or B12 shots.     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 arthroscopy x 2.    Colitis    Headache(784.0)    migraines   Hydradenitis 2001   axillary, multiple surgical I & Ds/excisions.   Hypertension    Nausea & vomiting 11/28/2014   Obesity (BMI 30.0-34.9)    Pilonidal cyst    Pityriasis rosea    pt also gives hx of seborrheic dermatitis.     Family History  Problem Relation Age of Onset   Lupus Mother    Stroke Mother    Hypertension Mother    Crohn's disease Father    Hypertension Father    Gallbladder disease Father    Cancer Maternal Grandfather        lung   COPD Maternal Grandfather    Emphysema Maternal Grandfather    Lung cancer Maternal Grandmother    Stroke Maternal Grandmother    Coronary artery disease Paternal Grandfather     Social History:   reports that she has been smoking cigarettes. She started smoking about 7 years ago. She has a 5.00 pack-year smoking history. She has never used smokeless tobacco. She reports current alcohol use of about 1.0 - 2.0 standard drink of alcohol per week. She reports current drug use. Drug: Other-see comments.  Medications  Current Facility-Administered Medications:    acetaminophen (TYLENOL) tablet 650 mg, 650 mg, Oral,  Q4H PRN **OR** acetaminophen (TYLENOL) 160 MG/5ML solution 650 mg, 650 mg, Per Tube, Q4H PRN **OR** acetaminophen (TYLENOL) suppository 650 mg, 650 mg, Rectal, Q4H PRN, Tamala Julian, Rondell A, MD   amLODipine (NORVASC) tablet 10 mg, 10 mg, Oral, Daily, Smith, Rondell A, MD   butalbital-acetaminophen-caffeine (FIORICET) 50-325-40 MG per tablet 1 tablet, 1 tablet, Oral, Q6H PRN, Tamala Julian, Rondell A, MD   enoxaparin (LOVENOX) injection 40 mg, 40 mg, Subcutaneous, Q24H, Smith, Rondell A, MD, 40 mg at 11/25/20 1111   hydrALAZINE (APRESOLINE) injection 10 mg, 10 mg, Intravenous, Q4H PRN, Tamala Julian, Rondell A, MD   LORazepam (ATIVAN) injection 0.5 mg, 0.5 mg, Intravenous, Once PRN, Tamala Julian, Rondell A, MD   ofloxacin (OCUFLOX) 0.3 % ophthalmic solution 1  drop, 1 drop, Right Eye, QID, Tamala Julian, Rondell A, MD, 1 drop at 11/25/20 1112   promethazine (PHENERGAN) 12.5 mg in sodium chloride 0.9 % 50 mL IVPB, 12.5 mg, Intravenous, Q6H PRN, Norval Morton, MD  Exam: Current vital signs: BP (!) 158/97 (BP Location: Left Arm)   Pulse 64   Temp 98 F (36.7 C) (Oral)   Resp 14   Ht 5\' 11"  (1.803 m)   Wt 79.6 kg   SpO2 99%   BMI 24.48 kg/m  Vital signs in last 24 hours: Temp:  [98 F (36.7 C)-98.6 F (37 C)] 98 F (36.7 C) (07/09 1107) Pulse Rate:  [56-98] 64 (07/09 1107) Resp:  [14-18] 14 (07/09 1107) BP: (139-199)/(90-136) 158/97 (07/09 1107) SpO2:  [95 %-100 %] 99 % (07/09 1107) Weight:  [78.9 kg-79.6 kg] 79.6 kg (07/09 0533)  PE: GENERAL: Well appearing female in NAD.  HEENT: normocephalic and atraumatic. LUNGS - Normal respiratory effort.  CV - RRR on tele. ABDOMEN - Soft, nontender. Ext: warm, well perfused. Psych: affect dull.   NEURO:  Mental Status: Alert and oriented x3. Speech/Language: speech is without dysarthria or aphasia.  Naming, repetition, fluency, and comprehension intact.  Cranial Nerves:  II: PERRL. III, IV, VI: EOMI. Lid elevation symmetric and full.  V: sensation is intact and symmetrical to face.  VII: Smile is symmetrical.  VIII:hearing intact to voice. IX, X: palate elevation is symmetric. Phonation normal.  XI: normal sternocleidomastoid and trapezius muscle strength. PRF:FMBWGY is symmetrical without fasciculations.   Motor:  5/5 strength to all muscle groups.  Tone is normal. Bulk is normal.  Sensation- Intact to light touch bilaterally in all four extremities. Extinction absent to light touch to DSS.  Coordination: FTN intact bilaterally. HKS intact bilaterally. No pronator drift.  DTRs:  2+ throughout.  Gait- deferred.  Labs I have reviewed labs in epic and the results pertinent to this consultation are:  CBC    Component Value Date/Time   WBC 6.5 11/24/2020 2313   RBC 4.05 11/24/2020  2313   HGB 13.5 11/24/2020 2313   HCT 40.9 11/24/2020 2313   PLT 231 11/24/2020 2313   MCV 101.0 (H) 11/24/2020 2313   MCH 33.3 11/24/2020 2313   MCHC 33.0 11/24/2020 2313   RDW 13.5 11/24/2020 2313   LYMPHSABS 2.8 11/24/2020 2313   MONOABS 0.3 11/24/2020 2313   EOSABS 0.1 11/24/2020 2313   BASOSABS 0.0 11/24/2020 2313    CMP     Component Value Date/Time   NA 145 11/24/2020 2313   K 3.3 (L) 11/24/2020 2313   CL 110 11/24/2020 2313   CO2 28 11/24/2020 2313   GLUCOSE 93 11/24/2020 2313   BUN 8 11/24/2020 2313   CREATININE 0.63 11/24/2020 2313  CREATININE 0.66 10/05/2020 0000   CALCIUM 8.7 (L) 11/24/2020 2313   PROT 5.7 (L) 11/24/2020 2313   ALBUMIN 3.1 (L) 11/24/2020 2313   AST 23 11/24/2020 2313   ALT 23 11/24/2020 2313   ALKPHOS 180 (H) 11/24/2020 2313   BILITOT 0.4 11/24/2020 2313   GFRNONAA >60 11/24/2020 2313   GFRNONAA 107 10/05/2020 0000   GFRAA 124 10/05/2020 0000   MRI brain/MRV: 1. No evidence of acute intracranial abnormality. Specifically, no acute infarct. 2. No evidence of dural venous sinus thrombosis.  Assessment: 45 yo female with a long history of MHAs who presented today with her normal right sided HA, but also right ear pressure, blurred vision, n/v, and paraesthesias to face. Her head imaging did not show any abnormal findings. Given Nurtec is working fairly well, it is reasonable to continue this. Given her sleep deprivation, will add Elavil because lack of sleep can certainly contribute to MHAs. NP believes that her HTN is also likely contributing to HA. Given no history of neurology visit, will refer her.    Impression: -Status migrainous.  -HTN.   Recommendations/Plan:  -Continue Nurtec as prescribed by PCP.  -Add Elavil 25mg  po qhs.  -Neurology referral made for 4 weeks for MHAs.  -Patient is asking to leave, and by our standpoint, she can go.   Plan discussed with medicine MD.   Pt seen by Clance Boll, NP/Neuro and later by MD.  Note/plan to be edited by MD as needed.  Pager: [phone]   Seen I have seen the patient and reviewed the above note.  She is asking to go home, but still has significant headache.  Her headache is unilateral, throbbing in nature, associated with photophobia and nausea.  Though it is worse than her typical migraines, I do think that this is most consistent with complicated migraine.  Her paresthesias wax and wane in distribution, which is also most characteristic of migraine.  After discussion, I think that lack of sleep is playing a significant role and she does not think that she has tried Elavil for prophylaxis.  I would favor starting this and we can try Compazine again before she leaves.  Roland Rack, MD [facility] [phone]  If 7pm- 7am, please page neurology on call as listed in [place].

## 2020-11-25 NOTE — Progress Notes (Signed)
Received from [place] by [place]. Oriented to room and surroundings.  Paged MD to notify of her arrival and to obtain orders.

## 2020-11-25 NOTE — Consult Note (Signed)
[facility] TeleNeurology Consult Services  Stat Consult  Date of Service:   11/25/2020 01:24:00  Diagnosis:       R51.9 - Headache, unspecified  Impression: 45yo F with hx of migraines, HTN presenting with headache x 2 weeks with associated R-sided numbness and vision changes. CT head and CTA head/neck are unremarkable. Symptoms are consistent with status migrainosus, but MRI is recommended since the R-sided numbness is new.  CT HEAD: Reviewed Unremarkable  Our recommendations are outlined below.  Disposition: Neurology will follow  Additional Recommendations: Fundoscopic exam to rule out papilledema (if present, obtain MRV head along with MRI brain) MRI brain without gad Trial of Depakote 800mg  IV x 1 *OR* dihydroergotamine 1mg  IV x 1 PLUS Zofran Confirm negative pregnancy test prior to administering either of the above options   Labs: Reviewed. AST and ALT normal  Metrics: [facility] Notification Time: 11/25/2020 01:21:18 Stamp Time: 11/25/2020 01:24:00 Callback Response Time: 11/25/2020 01:25:59   ----------------------------------------------------------------------------------------------------  Chief Complaint: Headache  History of Present Illness: Patient is a 45 year old Female.  45yo F with hx of migraines, HTN presenting with headache. Patient has had a severe headache over the last 2 weeks. It has been constant/ongoing. The pain is mostly R-sided, can be either sharp or throbbing, associated with N/V and photophobia and phonophobia. The headache severity does not change whether lying flat vs sitting upright. Today she has noted some vision changes -- everything looks darker than usual. Since 6pm she has also noted some numbness on her R face, arm, and leg. She has never had the numbness with her migraines before. She has tried OTC meds like ibuprofen and excedrin and she has tried Careers information officer. In the ED she received Toradol and  Benadryl and Zofran without much relief.    Past Medical History:      Hypertension      There is NO history of Diabetes Mellitus      There is NO history of Hyperlipidemia      There is NO history of Atrial Fibrillation      There is NO history of Coronary Artery Disease      There is NO history of Stroke      There is NO history of Covid-19  Anticoagulant use:  No  Antiplatelet use: No    Examination: BP(139/117), Pulse(75), Blood Glucose(93) 1A: Level of Consciousness - Alert; keenly responsive + 0 1B: Ask Month and Age - Both Questions Right + 0 1C: Blink Eyes & Squeeze Hands - Performs Both Tasks + 0 2: Test Horizontal Extraocular Movements - Normal + 0 3: Test Visual Fields - No Visual Loss + 0 4: Test Facial Palsy (Use Grimace if Obtunded) - Normal symmetry + 0 5A: Test Left Arm Motor Drift - No Drift for 10 Seconds + 0 5B: Test Right Arm Motor Drift - No Drift for 10 Seconds + 0 6A: Test Left Leg Motor Drift - No Drift for 5 Seconds + 0 6B: Test Right Leg Motor Drift - No Drift for 5 Seconds + 0 7: Test Limb Ataxia (FNF/Heel-Shin) - No Ataxia + 0 8: Test Sensation - Mild-Moderate Loss: Less Sharp/More Dull + 1 9: Test Language/Aphasia - Normal; No aphasia + 0 10: Test Dysarthria - Normal + 0 11: Test Extinction/Inattention - No abnormality + 0  NIHSS Score: 1     Patient / Family was informed the Neurology Consult would occur via TeleHealth consult by way of interactive audio and video telecommunications and  consented to receiving care in this manner.  Patient is being evaluated for possible acute neurologic impairment and high probability of imminent or life - threatening deterioration.I spent total of 35 minutes providing care to this patient, including time for face to face visit via telemedicine, review of medical records, imaging studies and discussion of findings with providers, the patient and / or family.   Dr Damaris Hippo   [facility] [phone]  Case [id number]

## 2020-11-25 NOTE — Discharge Summary (Signed)
[name], is a 45 y.o. female  DOB 04-21-1976  MRN [id number].  Admission date:  11/24/2020  Admitting Physician  Norval Morton, MD  Discharge Date:  11/25/2020   Primary MD  Luetta Nutting, DO  Recommendations for primary care physician for things to follow:   -Referred to [facility] neurology for follow-up of migraines -Follow-up on patient's blood pressures since being restarted on Benicar-hydrochlorothiazide -Corneal abrasion -Hypokalemia   Discharge Diagnosis  Status migrainosus [G43.901]  Active Problems:   Hypokalemia   Elevated liver enzymes   Migraine   Hypertensive emergency   Corneal abrasion   Elevated MCV   Visual changes      Past Medical History:  Diagnosis Date   Adnexal cyst 2013   right   Anemia 2015   Pernicious and iron deficiency. As of 2015/2016 no longer requiring po iron or B12 shots.    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 arthroscopy x 2.    Colitis    Headache(784.0)    migraines   Hydradenitis 2001   axillary, multiple surgical I & Ds/excisions.   Hypertension    Nausea & vomiting 11/28/2014   Obesity (BMI 30.0-34.9)    Pilonidal cyst    Pityriasis rosea    pt also gives hx of seborrheic dermatitis.     Past Surgical History:  Procedure Laterality Date   ABDOMINAL HYSTERECTOMY     CYSTECTOMY     2004 axilla bil   ESOPHAGOGASTRODUODENOSCOPY N/A 11/30/2014   Procedure: ESOPHAGOGASTRODUODENOSCOPY (EGD);  Surgeon: Jerene Bears, MD;  Location: [facility];  Service: Endoscopy;  Laterality: N/A;   FRACTURE SURGERY     rt arm   GASTRIC BYPASS  2011   gastric sleeve   KNEE SURGERY  2005   left   LAPAROSCOPIC CHOLECYSTECTOMY  2002   MEDIAL PATELLOFEMORAL LIGAMENT REPAIR Left 06/08/2013   Procedure: DIAGNOSTIC OPERATIVE ARTHROSCOPY, OPEN MEDIAL PATELLA FEMORAL LIGAMENT  RECONSTRUCTION;  Surgeon: Meredith Pel, MD;  Location: [place];  Service: Orthopedics;  Laterality: Left;  with Hamstring autograft   OVARIAN CYST REMOVAL     PARTIAL HYSTERECTOMY     R arm surgery         HPI  from the history and physical done on the day of admission:    [name] is a 45 y.o. female with medical history significant of hypertension, migraine headaches, and iron deficiency anemia who presents with complaints of right-sided headache over the last 2 to 3 weeks.  Patient reports that headache has been constant and feels like pressure.  Associated symptoms include blurry vision, sensitivity to light, sensitivity to sound, nausea, vomiting, paresthesias of the right side of the face and ringing in her ear.  She chronically has intermittent swelling legs that comes and goes and is changing.  Patient had been evaluated by her primary care provider recently and had been tried on Maxalt and Rimegepant without improvement in symptoms.   ED Course: Upon admission to the emergency department patient was noted to be  afebrile, pulse 56-98, blood pressures 139/117-199/21, and all other vital signs maintained.  Labs from 7/8 significant for MCV 101, potassium 3.3, alkaline phosphatase 180, albumin 3.1, and sed rate 7.  CT angiogram of the head and neck did not note any acute large vessel occlusion or vascular abnormality.  Intraocular pressures were noted to be normal, but patient was noted to have a corneal abrasion on funduscopic evaluation by the ED provider.  Telemetry neurology was consulted and recommended MRI of the brain without contrast,  trial of Depakote 800 mg IV or dihydroergotamine 1mg  IV x 1 PLUS Zofran, and to confirm negative pregnancy test which was done.  Patient had been given 1 L normal saline IV fluids, ketorolac 30 mg IV, Benadryl 25 mg IV, Phenergan, Depakote 750 mg IV, and started on ofloxacin ophthalmic drops to the right eye.   Hospital Course:    Visual  change and paresthesias suspected status migrainous: Acute.  Patient presents with complaints of right-sided headache for the last 2 weeks with visual changes and complaints of paresthesias on the right side of the face.  CTA of the head and neck negative for any acute abnormalities. Differential includes migraine headache, uncontrolled hypertension, and electrolyte abnormalities.  Less likely includes temporal arteritis with normal sedimentation rate.  MRI of the brain was negative for any acute abnormalities.  Patient was evaluated by neurology who recommended starting amitriptyline 25 mg nightly.  Patient was referred to [facility] neurology.   Hypertensive emergency: Acute.  Blood pressures initially elevated up to 199/121 on admission.  Patient does not appear to be on any antihypertensives at baseline.  Patient was recommended to be started on amlodipine 10 mg daily.  However patient reported that she had serious complications from amlodipine previously in the past.  Patient was okay with starting on Benicar -hydrochlorothiazide 40-12.5 mg daily.  Patient was recommended to follow-up with her primary care provider in regards to her blood pressure management.  Hypokalemia: Acute.  Potassium 3.3 on admission.  She has been given 40 mEq of potassium chloride p.o. in the emergency department.   Corneal abrasion of right eye: Acute.  Seen on funduscopic exam by the ED provider.  This could be partly the cause for patient's blurred vision. Continue ofloxacin drops for a total of 4 more days -Recommend scheduling a follow-up appointment with an ophthalmologist within the next week   Elevated MCV: On admission MCV was noted to be elevated at 101.  Home medications appeared to include vitamin B12.  Patient vitamin B12 level was 221 which is the lower limit of normal. -Recommend continuing vitamin B12 supplementation   Elevated liver enzyme: Chronic.  Alkaline phosphatase noted to be 180, but has been  elevated since 2019.  AST and ALT which previously were noted to be elevated were now within normal limits.  Hepatitis C testing was nonreactive. -Continue outpatient follow-up with PCP   Follow UP   Follow-up Information     Luetta Nutting, DO Follow up.   Specialty: Family Medicine Contact information: [address] [place] [address] [facility] Follow up.   Why: A referral has been sent for you to be seen in regards to your headaches.  Please call their office if not contacted within 1 week to ensure a follow-up appointment is made. Contact information: [address]     [place] [address] [phone]  Consults obtained - Neurology  Discharge Condition: Fair  Diet and Activity recommendation: See Discharge Instructions below  Discharge Instructions     Discharge Instructions     Ambulatory referral to Neurology   Complete by: As directed    An appointment is requested in approximately: 2-4 weeks for migraine management.   Diet - low sodium heart healthy   Complete by: As directed    Discharge instructions   Complete by: As directed    During your hospital stay you have been evaluated and had a MRI and CT scan of your head which showed no signs of a stroke.  Neurology formally evaluated you for your symptoms, and recommended starting you on amitriptyline/Elavil.  It is recommended that you follow-up with a eye doctor to ensure proper healing of the corneal abrasion to your right eye.  Please continue ofloxacin eyedrops 4 times daily for 4 more days.  Other possible causes for your continued headache include elevated blood pressure.  It was recommended that she start on Benicar-hydrochlorothiazide 40-12.5 mg daily to help lower your blood pressures.  You should follow-up with your primary care provider within 1 week from discharge.  Prescriptions have  been sent to your pharmacy for medications as noted above.  Most prescriptions are for 1 month.  If after evaluation by your primary care provider it is determined that these medications should be continued they will be the ones in charge of prescribing them moving forward.  They have also put in a referral for you to [facility].   Increase activity slowly   Complete by: As directed          Discharge Medications     Allergies as of 11/25/2020       Reactions   Penicillins Anaphylaxis, Swelling, Rash   Has patient had a PCN reaction causing immediate rash, facial/tongue/throat swelling, SOB or lightheadedness with hypotension: Yes Has patient had a PCN reaction causing severe rash involving mucus membranes or skin necrosis: No Has patient had a PCN reaction that required hospitalization No Has patient had a PCN reaction occurring within the last 10 years: No If all of the above answers are "NO", then may proceed with Cephalosporin use. Face and throat swell        Medication List     STOP taking these medications    predniSONE 20 MG tablet Commonly known as: DELTASONE       TAKE these medications    amitriptyline 25 MG tablet Commonly known as: ELAVIL Take 1 tablet (25 mg total) by mouth at bedtime.   cyclobenzaprine 10 MG tablet Commonly known as: FLEXERIL Take 1 tablet (10 mg total) by mouth 3 (three) times daily as needed for muscle spasms.   dicyclomine 10 MG capsule Commonly known as: BENTYL Take 1 capsule (10 mg total) by mouth 4 (four) times daily -  before meals and at bedtime. What changed: when to take this   gabapentin 300 MG capsule Commonly known as: NEURONTIN Take 1 capsule (300 mg total) by mouth 3 (three) times daily. Take 300mg  qhs x1 week then may increase to 300mg  TID What changed: additional instructions   Nurtec 75 MG Tbdp Generic drug: Rimegepant Sulfate Take 75 mg by mouth daily as needed (migraine).   ofloxacin 0.3 %  ophthalmic solution Commonly known as: OCUFLOX Place 1 drop into the right eye 4 (four) times daily.   olmesartan-hydrochlorothiazide 40-12.5 MG tablet Commonly known as: Benicar HCT Take 1 tablet by mouth daily.  ondansetron 4 MG tablet Commonly known as: Zofran Take 1 tablet (4 mg total) by mouth every 8 (eight) hours as needed for nausea or vomiting.   rizatriptan 10 MG tablet Commonly known as: Maxalt Take 1 tablet (10 mg total) by mouth once for 1 dose. May repeat in 2 hours if needed What changed:  when to take this reasons to take this   traZODone 100 MG tablet Commonly known as: DESYREL Take 1 tablet (100 mg total) by mouth at bedtime as needed for sleep.   vitamin B-12 1000 MCG tablet Commonly known as: CYANOCOBALAMIN Take 1,000 mcg by mouth daily.   Vitamin D (Ergocalciferol) 1.25 MG (50000 UNIT) Caps capsule Commonly known as: DRISDOL Take 1 capsule (50,000 Units total) by mouth every 7 (seven) days.        Major procedures and Radiology Reports - PLEASE review detailed and final reports for all details, in brief -     CT Angio Head W or Wo Contrast  Result Date: 11/25/2020 CLINICAL DATA:  Initial evaluation for neuro deficit, stroke suspected. EXAM: CT ANGIOGRAPHY HEAD AND NECK TECHNIQUE: Multidetector CT imaging of the head and neck was performed using the standard protocol during bolus administration of intravenous contrast. Multiplanar CT image reconstructions and MIPs were obtained to evaluate the vascular anatomy. Carotid stenosis measurements (when applicable) are obtained utilizing NASCET criteria, using the distal internal carotid diameter as the denominator. CONTRAST:  38mL OMNIPAQUE IOHEXOL 350 MG/ML SOLN COMPARISON:  Prior study from 08/15/2019. FINDINGS: CT HEAD FINDINGS Brain: Cerebral volume within normal limits for patient age. No evidence for acute intracranial hemorrhage. No findings to suggest acute large vessel territory infarct. No mass lesion,  midline shift, or mass effect. Ventricles are normal in size without evidence for hydrocephalus. No extra-axial fluid collection identified. Vascular: No hyperdense vessel identified. Skull: Scalp soft tissues demonstrate no acute abnormality. Calvarium intact. Sinuses/Orbits: Globes and orbital soft tissues within normal limits. Visualized paranasal sinuses are clear. No mastoid effusion. CTA NECK FINDINGS Aortic arch: Visualized aortic arch of normal caliber with normal branch pattern. No stenosis or other abnormality about the origin of the great vessels. Right carotid system: Right common and internal carotid arteries widely patent without stenosis, dissection or occlusion. Left carotid system: Left common and internal carotid arteries widely patent without stenosis, dissection or occlusion. Vertebral arteries: Both vertebral arteries arise from the subclavian arteries. No proximal subclavian artery stenosis. Both vertebral arteries widely patent without stenosis, dissection or occlusion. Skeleton: No visible acute osseous finding. No discrete osseous lesions. Patient is edentulous. Other neck: No other acute soft tissue abnormality within the neck. Upper chest: Mild atelectatic changes noted dependently within the visualized left upper lobe. Visualized upper chest demonstrates no other acute finding. Review of the MIP images confirms the above findings CTA HEAD FINDINGS Anterior circulation: Both internal carotid arteries widely patent to the termini without stenosis. A1 segments widely patent. Normal anterior communicating artery complex. Both anterior cerebral arteries widely patent to their distal aspects without stenosis. No M1 stenosis or occlusion. Normal MCA bifurcations. Distal MCA branches well perfused and symmetric. Posterior circulation: Both V4 segments patent to the vertebrobasilar junction without stenosis. Both PICA origins patent and normal. Basilar widely patent to its distal aspect without  stenosis. Superior cerebellar arteries patent bilaterally. Both PCAs supplied via the basilar as well as small bilateral posterior communicating arteries. PCAs widely patent to their distal aspects. Venous sinuses: Grossly patent retirement hydrocephalus. Anatomic variants: None significant.  No aneurysm. Review of the  MIP images confirms the above findings IMPRESSION: 1. Normal CTA of the head and neck. No large vessel occlusion, hemodynamically significant stenosis, or other acute vascular abnormality. 2. No other acute intracranial abnormality identified. Electronically Signed   By: Jeannine Boga M.D.   On: 11/25/2020 02:25   CT Angio Neck W and/or Wo Contrast  Result Date: 11/25/2020 CLINICAL DATA:  Initial evaluation for neuro deficit, stroke suspected. EXAM: CT ANGIOGRAPHY HEAD AND NECK TECHNIQUE: Multidetector CT imaging of the head and neck was performed using the standard protocol during bolus administration of intravenous contrast. Multiplanar CT image reconstructions and MIPs were obtained to evaluate the vascular anatomy. Carotid stenosis measurements (when applicable) are obtained utilizing NASCET criteria, using the distal internal carotid diameter as the denominator. CONTRAST:  43mL OMNIPAQUE IOHEXOL 350 MG/ML SOLN COMPARISON:  Prior study from 08/15/2019. FINDINGS: CT HEAD FINDINGS Brain: Cerebral volume within normal limits for patient age. No evidence for acute intracranial hemorrhage. No findings to suggest acute large vessel territory infarct. No mass lesion, midline shift, or mass effect. Ventricles are normal in size without evidence for hydrocephalus. No extra-axial fluid collection identified. Vascular: No hyperdense vessel identified. Skull: Scalp soft tissues demonstrate no acute abnormality. Calvarium intact. Sinuses/Orbits: Globes and orbital soft tissues within normal limits. Visualized paranasal sinuses are clear. No mastoid effusion. CTA NECK FINDINGS Aortic arch: Visualized  aortic arch of normal caliber with normal branch pattern. No stenosis or other abnormality about the origin of the great vessels. Right carotid system: Right common and internal carotid arteries widely patent without stenosis, dissection or occlusion. Left carotid system: Left common and internal carotid arteries widely patent without stenosis, dissection or occlusion. Vertebral arteries: Both vertebral arteries arise from the subclavian arteries. No proximal subclavian artery stenosis. Both vertebral arteries widely patent without stenosis, dissection or occlusion. Skeleton: No visible acute osseous finding. No discrete osseous lesions. Patient is edentulous. Other neck: No other acute soft tissue abnormality within the neck. Upper chest: Mild atelectatic changes noted dependently within the visualized left upper lobe. Visualized upper chest demonstrates no other acute finding. Review of the MIP images confirms the above findings CTA HEAD FINDINGS Anterior circulation: Both internal carotid arteries widely patent to the termini without stenosis. A1 segments widely patent. Normal anterior communicating artery complex. Both anterior cerebral arteries widely patent to their distal aspects without stenosis. No M1 stenosis or occlusion. Normal MCA bifurcations. Distal MCA branches well perfused and symmetric. Posterior circulation: Both V4 segments patent to the vertebrobasilar junction without stenosis. Both PICA origins patent and normal. Basilar widely patent to its distal aspect without stenosis. Superior cerebellar arteries patent bilaterally. Both PCAs supplied via the basilar as well as small bilateral posterior communicating arteries. PCAs widely patent to their distal aspects. Venous sinuses: Grossly patent retirement hydrocephalus. Anatomic variants: None significant.  No aneurysm. Review of the MIP images confirms the above findings IMPRESSION: 1. Normal CTA of the head and neck. No large vessel occlusion,  hemodynamically significant stenosis, or other acute vascular abnormality. 2. No other acute intracranial abnormality identified. Electronically Signed   By: Jeannine Boga M.D.   On: 11/25/2020 02:25   MR BRAIN WO CONTRAST  Result Date: 11/25/2020 CLINICAL DATA:  Neuro deficit, acute stroke suspected. EXAM: MRI HEAD WITHOUT CONTRAST MRV HEAD WITH WITHOUT CONTRAST TECHNIQUE: Multiplanar, multi-echo pulse sequences of the brain and surrounding structures were acquired without intravenous contrast. Angiographic images of the intracranial venous structures were acquired using MRV technique with and without intravenous contrast. COMPARISON:  CTA from  the same day. FINDINGS: MRI HEAD WITHOUT CONTRAST Brain: No acute infarction, hemorrhage, hydrocephalus, extra-axial collection or mass lesion. Vascular: Major arterial flow voids are maintained skull base. Please see same day CTA for further evaluation. Skull and upper cervical spine: Normal marrow signal. Sinuses/Orbits: Clear sinuses.  Unremarkable orbits. Other: No sizable mastoid effusions. MR VENOGRAM WITHOUT CONTRAST No evidence of dural venous sinus thrombosis. Small rounded filling defects in the left transverse sinus and superior sagittal sinus are compatible with arachnoid granulations. The superior sagittal sinus, transverse sinuses, sigmoid sinuses, and jugular bulbs are patent. The visualized deep cerebral veins and cortical veins are patent. The straight sinus is mildly narrowed anteriorly but remains patent. IMPRESSION: 1. No evidence of acute intracranial abnormality. Specifically, no acute infarct. 2. No evidence of dural venous sinus thrombosis. Electronically Signed   By: Margaretha Sheffield MD   On: 11/25/2020 09:53   MR MRV HEAD W WO CONTRAST  Result Date: 11/25/2020 CLINICAL DATA:  Neuro deficit, acute stroke suspected. EXAM: MRI HEAD WITHOUT CONTRAST MRV HEAD WITH WITHOUT CONTRAST TECHNIQUE: Multiplanar, multi-echo pulse sequences of the  brain and surrounding structures were acquired without intravenous contrast. Angiographic images of the intracranial venous structures were acquired using MRV technique with and without intravenous contrast. COMPARISON:  CTA from the same day. FINDINGS: MRI HEAD WITHOUT CONTRAST Brain: No acute infarction, hemorrhage, hydrocephalus, extra-axial collection or mass lesion. Vascular: Major arterial flow voids are maintained skull base. Please see same day CTA for further evaluation. Skull and upper cervical spine: Normal marrow signal. Sinuses/Orbits: Clear sinuses.  Unremarkable orbits. Other: No sizable mastoid effusions. MR VENOGRAM WITHOUT CONTRAST No evidence of dural venous sinus thrombosis. Small rounded filling defects in the left transverse sinus and superior sagittal sinus are compatible with arachnoid granulations. The superior sagittal sinus, transverse sinuses, sigmoid sinuses, and jugular bulbs are patent. The visualized deep cerebral veins and cortical veins are patent. The straight sinus is mildly narrowed anteriorly but remains patent. IMPRESSION: 1. No evidence of acute intracranial abnormality. Specifically, no acute infarct. 2. No evidence of dural venous sinus thrombosis. Electronically Signed   By: Margaretha Sheffield MD   On: 11/25/2020 09:53   US Abdomen Limited RUQ (LIVER/GB)  Result Date: 10/30/2020 CLINICAL DATA:  Elevated liver enzyme EXAM: ULTRASOUND ABDOMEN LIMITED RIGHT UPPER QUADRANT COMPARISON:  CT 03/30/2018 FINDINGS: Gallbladder: Surgically absent Common bile duct: Diameter: 8 mm Liver: No focal lesion identified. Within normal limits in parenchymal echogenicity. Liver appears slightly enlarged but without focal hepatic mass lesion. Portal vein is patent on color Doppler imaging with normal direction of blood flow towards the liver. Other: None. IMPRESSION: Status post cholecystectomy. Liver appears borderline to slightly enlarged but is otherwise within normal limits.  Electronically Signed   By: Donavan Foil M.D.   On: 10/30/2020 22:18    Micro Results     Recent Results (from the past 240 hour(s))  Resp Panel by RT-PCR (Flu A&B, Covid) Nasopharyngeal Swab     Status: None   Collection Time: 11/25/20  3:33 AM   Specimen: Nasopharyngeal Swab; Nasopharyngeal(NP) swabs in vial transport medium  Result Value Ref Range Status   SARS Coronavirus 2 by RT PCR NEGATIVE NEGATIVE Final    Comment: (NOTE) SARS-CoV-2 target nucleic acids are NOT DETECTED.  The SARS-CoV-2 RNA is generally detectable in upper respiratory specimens during the acute phase of infection. The lowest concentration of SARS-CoV-2 viral copies this assay can detect is 138 copies/mL. A negative result does not preclude SARS-Cov-2 infection and should  not be used as the sole basis for treatment or other patient management decisions. A negative result may occur with  improper specimen collection/handling, submission of specimen other than nasopharyngeal swab, presence of viral mutation(s) within the areas targeted by this assay, and inadequate number of viral copies(<138 copies/mL). A negative result must be combined with clinical observations, patient history, and epidemiological information. The expected result is Negative.  Fact Sheet for Patients:  EntrepreneurPulse.com.au  Fact Sheet for Healthcare Providers:  IncredibleEmployment.be  This test is no t yet approved or cleared by the [place] FDA and  has been authorized for detection and/or diagnosis of SARS-CoV-2 by FDA under an Emergency Use Authorization (EUA). This EUA will remain  in effect (meaning this test can be used) for the duration of the COVID-19 declaration under Section 564(b)(1) of the Act, 21 U.S.C.section 360bbb-3(b)(1), unless the authorization is terminated  or revoked sooner.       Influenza A by PCR NEGATIVE NEGATIVE Final   Influenza B by PCR NEGATIVE NEGATIVE  Final    Comment: (NOTE) The Xpert Xpress SARS-CoV-2/FLU/RSV plus assay is intended as an aid in the diagnosis of influenza from Nasopharyngeal swab specimens and should not be used as a sole basis for treatment. Nasal washings and aspirates are unacceptable for Xpert Xpress SARS-CoV-2/FLU/RSV testing.  Fact Sheet for Patients: EntrepreneurPulse.com.au  Fact Sheet for Healthcare Providers: IncredibleEmployment.be  This test is not yet approved or cleared by the [place] FDA and has been authorized for detection and/or diagnosis of SARS-CoV-2 by FDA under an Emergency Use Authorization (EUA). This EUA will remain in effect (meaning this test can be used) for the duration of the COVID-19 declaration under Section 564(b)(1) of the Act, 21 U.S.C. section 360bbb-3(b)(1), unless the authorization is terminated or revoked.  Performed at [facility], [address] [place], [place] [address]        Today   Subjective    [name] reports that she still has a headache, but has to leave the hospital so that she does not lose her job.   Objective   Blood pressure (!) 158/97, pulse 64, temperature 98 F (36.7 C), temperature source Oral, resp. rate 14, height 5\' 11"  (1.803 m), weight 79.6 kg, SpO2 99 %.   Intake/Output Summary (Last 24 hours) at 11/25/2020 1447 Last data filed at 11/25/2020 0409 Gross per 24 hour  Intake 1050 ml  Output --  Net 1050 ml    Exam  Constitutional: NAD, calm, comfortable Eyes: PERRL, lids and conjunctivae normal ENMT: Mucous membranes are moist. Posterior pharynx clear of any exudate or lesions.  Neck: normal, supple, no masses, no thyromegaly Respiratory: clear to auscultation bilaterally, no wheezing, no crackles. Normal respiratory effort. No accessory muscle use.  Cardiovascular: Regular rate and rhythm, no murmurs / rubs / gallops. No extremity edema. 2+ pedal pulses. No  carotid bruits.  Abdomen: no tenderness, no masses palpated. No hepatosplenomegaly. Bowel sounds positive.  Musculoskeletal: no clubbing / cyanosis. No joint deformity upper and lower extremities. Good ROM, no contractures. Normal muscle tone.  Skin: no rashes, lesions, ulcers. No induration Neurologic: CN 2-12 grossly intact. Sensation intact, DTR normal. Strength 5/5 in all 4.  Psychiatric: Normal judgment and insight. Alert and oriented x 3. Normal mood.    Data Review   CBC w Diff:  Lab Results  Component Value Date   WBC 6.5 11/24/2020   HGB 13.5 11/24/2020   HCT 40.9 11/24/2020   PLT 231 11/24/2020  LYMPHOPCT 44 11/24/2020   MONOPCT 5 11/24/2020   EOSPCT 2 11/24/2020   BASOPCT 1 11/24/2020    CMP:  Lab Results  Component Value Date   NA 145 11/24/2020   K 3.3 (L) 11/24/2020   CL 110 11/24/2020   CO2 28 11/24/2020   BUN 8 11/24/2020   CREATININE 0.63 11/24/2020   CREATININE 0.66 10/05/2020   PROT 5.7 (L) 11/24/2020   ALBUMIN 3.1 (L) 11/24/2020   BILITOT 0.4 11/24/2020   ALKPHOS 180 (H) 11/24/2020   AST 23 11/24/2020   ALT 23 11/24/2020  .   Total Time in preparing paper work, data evaluation and todays exam - 35 minutes  Norval Morton M.D on 11/25/2020 at 2:47 PM  [facility]   Office  [phone]

## 2020-11-25 NOTE — Plan of Care (Signed)

## 2020-11-25 NOTE — ED Notes (Signed)
Called [facility] to transport patient to [facility] Room# 06

## 2020-11-25 NOTE — Plan of Care (Signed)

## 2020-11-25 NOTE — Progress Notes (Signed)
Pt is being DC. [facility] will be contacted to take patient back to [facility]. Patient will be wheeled down by staff. Patient has stated no further needs.

## 2020-12-21 ENCOUNTER — Telehealth: Payer: 59 | Admitting: Physician Assistant

## 2020-12-21 ENCOUNTER — Emergency Department
Admission: RE | Admit: 2020-12-21 | Discharge: 2020-12-21 | Disposition: A | Discharge status: Home / Self Care | Payer: 59 | Source: Ambulatory Visit

## 2020-12-21 ENCOUNTER — Other Ambulatory Visit: Payer: Self-pay

## 2020-12-21 VITALS — BP 169/113 | HR 67 | Temp 99.0°F | Resp 17

## 2020-12-21 DIAGNOSIS — G43019 Migraine without aura, intractable, without status migrainosus: Secondary | ICD-10-CM

## 2020-12-21 MED ORDER — KETOROLAC TROMETHAMINE 60 MG/2ML IM SOLN
60.0000 mg | Freq: Once | INTRAMUSCULAR | Status: AC
  Administered 2020-12-21: 60 mg via INTRAMUSCULAR

## 2020-12-21 MED ORDER — BUTALBITAL-APAP-CAFFEINE 50-325-40 MG PO TABS: 1.0000 | ORAL_TABLET | Freq: Four times a day (QID) | ORAL | 0 refills | Status: DC | PRN

## 2020-12-21 NOTE — Discharge Instructions (Addendum)
Advised/instructed patient to use Fioricet sparingly for breakthrough migraine only.  Patient is scheduled to follow-up with her PCP on Wednesday, 12/27/2020.

## 2020-12-21 NOTE — ED Provider Notes (Signed)
[name]    CSN: [id number] Arrival date & time: 12/21/20  1034      History   Chief Complaint Chief Complaint  Patient presents with   Migraine    HPI [name] is a 45 y.o. female.   HPI 45 year old female presents with migraine x2 days, reports nausea and light sensitivity E-visit this morning with PCP recommended coming to urgent care.  Reports taking Maxalt yesterday with no relief also tried Goody's powder with no relief patient reports taking Tylenol and Nurtec this morning prior to office visit currently rates migraine pain is 8 of 10.  Patient currently takes Nurtec for migraines as prescribed by her PCP; however, he had some Maxalt remaining from previous provider's prescription.  Patient reports migraine is sharp in nature and is behind her left eye currently, but reports affecting her entire head.  Past Medical History:  Diagnosis Date   Adnexal cyst 2013   right   Anemia 2015   Pernicious and iron deficiency. As of 2015/2016 no longer requiring po iron or B12 shots.    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 arthroscopy x 2.    Colitis    Headache(784.0)    migraines   Hydradenitis 2001   axillary, multiple surgical I & Ds/excisions.   Hypertension    Nausea & vomiting 11/28/2014   Obesity (BMI 30.0-34.9)    Pilonidal cyst    Pityriasis rosea    pt also gives hx of seborrheic dermatitis.     Patient Active Problem List   Diagnosis Date Noted   Hypertensive emergency 11/25/2020   Corneal abrasion 11/25/2020   Elevated MCV 11/25/2020   Visual changes 11/25/2020   Migraine 11/15/2020   Elevated liver enzymes 11/02/2020   Peripheral neuropathy 10/11/2020   Accidental drug overdose 08/18/2019   Suicide attempt ([place]) 08/17/2019   Irritable bowel syndrome with diarrhea 02/02/2018   Generalized anxiety disorder 02/02/2018   Perirectal abscess 10/28/2016   Chronic fatigue 01/09/2016   Adrenal nodule ([place]) 01/09/2016   Chronic low back pain  11/21/2015   Benign essential hypertension 08/13/2015   Elevated lipase 12/14/2014   AP (abdominal pain) 12/14/2014   Nausea and vomiting 11/29/2014   Intractable abdominal pain 11/28/2014   Hypokalemia 11/28/2014   HTN (hypertension) 11/28/2014   Pilonidal abscess 12/03/2013   Patellar instability of left knee 06/08/2013    Past Surgical History:  Procedure Laterality Date   ABDOMINAL HYSTERECTOMY     CYSTECTOMY     2004 axilla bil   ESOPHAGOGASTRODUODENOSCOPY N/A 11/30/2014   Procedure: ESOPHAGOGASTRODUODENOSCOPY (EGD);  Surgeon: Jerene Bears, MD;  Location: [facility] ENDOSCOPY;  Service: Endoscopy;  Laterality: N/A;   FRACTURE SURGERY     rt arm   GASTRIC BYPASS  2011   gastric sleeve   KNEE SURGERY  2005   left   LAPAROSCOPIC CHOLECYSTECTOMY  2002   MEDIAL PATELLOFEMORAL LIGAMENT REPAIR Left 06/08/2013   Procedure: DIAGNOSTIC OPERATIVE ARTHROSCOPY, OPEN MEDIAL PATELLA FEMORAL LIGAMENT RECONSTRUCTION;  Surgeon: Meredith Pel, MD;  Location: [place];  Service: Orthopedics;  Laterality: Left;  with Hamstring autograft   OVARIAN CYST REMOVAL     PARTIAL HYSTERECTOMY     R arm surgery      OB History   No obstetric history on file.      Home Medications    Prior to Admission medications   Medication Sig Start Date End Date Taking? Authorizing Provider  butalbital-acetaminophen-caffeine (FIORICET) 50-325-40 MG tablet Take 1 tablet by mouth  every 6 (six) hours as needed for headache. 12/21/20 12/21/21 Yes Eliezer Lofts, FNP  amitriptyline (ELAVIL) 25 MG tablet Take 1 tablet (25 mg total) by mouth at bedtime. 11/25/20   Norval Morton, MD  cyclobenzaprine (FLEXERIL) 10 MG tablet Take 1 tablet (10 mg total) by mouth 3 (three) times daily as needed for muscle spasms. 10/05/20   Luetta Nutting, DO  dicyclomine (BENTYL) 10 MG capsule Take 1 capsule (10 mg total) by mouth 4 (four) times daily -  before meals and at bedtime. Patient taking differently: Take 10 mg by mouth in the morning  and at bedtime. 10/05/20   Luetta Nutting, DO  gabapentin (NEURONTIN) 300 MG capsule Take 1 capsule (300 mg total) by mouth 3 (three) times daily. Take '300mg'$  qhs x1 week then may increase to '300mg'$  TID Patient taking differently: Take 300 mg by mouth 3 (three) times daily. 10/05/20   Luetta Nutting, DO  olmesartan-hydrochlorothiazide (BENICAR HCT) 40-12.5 MG tablet Take 1 tablet by mouth daily. 11/25/20   Norval Morton, MD  Rimegepant Sulfate (NURTEC) 75 MG TBDP Take 75 mg by mouth daily as needed (migraine). 11/15/20   Luetta Nutting, DO  traZODone (DESYREL) 100 MG tablet Take 1 tablet (100 mg total) by mouth at bedtime as needed for sleep. 10/05/20   Luetta Nutting, DO  vitamin B-12 (CYANOCOBALAMIN) 1000 MCG tablet Take 1,000 mcg by mouth daily.    [provider]  Vitamin D, Ergocalciferol, (DRISDOL) 1.25 MG (50000 UNIT) CAPS capsule Take 1 capsule (50,000 Units total) by mouth every 7 (seven) days. 10/13/20   Luetta Nutting, DO    Family History Family History  Problem Relation Age of Onset   Lupus Mother    Stroke Mother    Hypertension Mother    Crohn's disease Father    Hypertension Father    Gallbladder disease Father    Cancer Maternal Grandfather        lung   COPD Maternal Grandfather    Emphysema Maternal Grandfather    Lung cancer Maternal Grandmother    Stroke Maternal Grandmother    Coronary artery disease Paternal Grandfather     Social History Social History   Tobacco Use   Smoking status: Every Day    Packs/day: 0.50    Years: 10.00    Pack years: 5.00    Types: Cigarettes    Start date: 05/20/2013   Smokeless tobacco: Never  Vaping Use   Vaping Use: Never used  Substance Use Topics   Alcohol use: Yes    Alcohol/week: 1.0 - 2.0 standard drink    Types: 1 - 2 Standard drinks or equivalent per week    Comment: occ   Drug use: Yes    Types: Other-see comments    Comment: CBD rub     Allergies   Penicillins   Review of Systems Review of Systems   Neurological:  Positive for headaches.  All other systems reviewed and are negative.   Physical Exam Triage Vital Signs ED Triage Vitals  Enc Vitals Group     BP 12/21/20 1058 (!) 172/116     Pulse Rate 12/21/20 1058 67     Resp 12/21/20 1058 17     Temp 12/21/20 1058 99 F (37.2 C)     Temp Source 12/21/20 1058 Oral     SpO2 12/21/20 1058 99 %     Weight --      Height --      Head Circumference --  Peak Flow --      Pain Score 12/21/20 1100 8     Pain Loc --      Pain Edu? --      Excl. in [place]? --    No data found.  Updated Vital Signs BP (!) 169/113 (BP Location: Right Arm)   Pulse 67   Temp 99 F (37.2 C) (Oral)   Resp 17   SpO2 99%   Physical Exam Vitals and nursing note reviewed.  Constitutional:      General: She is not in acute distress.    Appearance: Normal appearance. She is normal weight. She is ill-appearing.  HENT:     Head: Normocephalic and atraumatic.     Right Ear: Ear canal and external ear normal.     Left Ear: Tympanic membrane, ear canal and external ear normal.     Mouth/Throat:     Mouth: Mucous membranes are moist.     Pharynx: Oropharynx is clear.  Eyes:     Extraocular Movements: Extraocular movements intact.     Conjunctiva/sclera: Conjunctivae normal.     Pupils: Pupils are equal, round, and reactive to light.  Cardiovascular:     Rate and Rhythm: Normal rate and regular rhythm.     Pulses: Normal pulses.     Heart sounds: Normal heart sounds.  Pulmonary:     Effort: Pulmonary effort is normal.     Breath sounds: Normal breath sounds. No wheezing, rhonchi or rales.  Musculoskeletal:        General: Normal range of motion.     Cervical back: Normal range of motion and neck supple. No rigidity or tenderness.  Lymphadenopathy:     Cervical: No cervical adenopathy.  Skin:    General: Skin is warm and dry.  Neurological:     General: No focal deficit present.     Mental Status: She is alert and oriented to person, place,  and time. Mental status is at baseline.     Cranial Nerves: No cranial nerve deficit.     Sensory: No sensory deficit.     Motor: No weakness.     Coordination: Coordination normal.     Gait: Gait normal.  Psychiatric:        Mood and Affect: Mood normal.        Behavior: Behavior normal.        Thought Content: Thought content normal.     UC Treatments / Results  Labs (all labs ordered are listed, but only abnormal results are displayed) Labs Reviewed - No data to display  EKG   Radiology No results found.  Procedures Procedures (including critical care time)  Medications Ordered in UC Medications  ketorolac (TORADOL) injection 60 mg (60 mg Intramuscular Given 12/21/20 1156)    Initial Impression / Assessment and Plan / UC Course  I have reviewed the triage vital signs and the nursing notes.  Pertinent labs & imaging results that were available during my care of the patient were reviewed by me and considered in my medical decision making (see chart for details).     MDM: 1.  Intractable migraine without aura and without status migrainosus-Toradol 60 mg given once in clinic today, Rx'd Fioricet. Advised/instructed patient to use Fioricet sparingly for breakthrough migraine only.  Patient is scheduled to follow-up with her PCP on Wednesday, 12/27/2020.  Patient discharged home, hemodynamically stable. Final Clinical Impressions(s) / UC Diagnoses   Final diagnoses:  Intractable migraine without aura and without status migrainosus  Discharge Instructions      Advised/instructed patient to use Fioricet sparingly for breakthrough migraine only.  Patient is scheduled to follow-up with her PCP on Wednesday, 12/27/2020.     ED Prescriptions     Medication Sig Dispense Auth. Provider   butalbital-acetaminophen-caffeine (FIORICET) 50-325-40 MG tablet Take 1 tablet by mouth every 6 (six) hours as needed for headache. 24 tablet Eliezer Lofts, FNP      I have  reviewed the PDMP during this encounter.   Eliezer Lofts, [place] 12/21/20 1218

## 2020-12-21 NOTE — ED Triage Notes (Signed)
Pt c/o migraine x 2 days. Nausea and light sensitivity. Evisit this am. Recommended come to UC. Maxalt yesterday with no relief. Also tried Goodys, no relief. Tylenol and Nurtec this am. Pain 8/10

## 2020-12-21 NOTE — Patient Instructions (Signed)
[name], thank you for joining [name], PA-C for today's virtual visit.  While this provider is not your primary care provider (PCP), if your PCP is located in our provider database this encounter information will be shared with them immediately following your visit.  Consent: (Patient) [name] provided verbal consent for this virtual visit at the beginning of the encounter.  Current Medications:  Current Outpatient Medications:    amitriptyline (ELAVIL) 25 MG tablet, Take 1 tablet (25 mg total) by mouth at bedtime., Disp: 30 tablet, Rfl: 0   cyclobenzaprine (FLEXERIL) 10 MG tablet, Take 1 tablet (10 mg total) by mouth 3 (three) times daily as needed for muscle spasms., Disp: 30 tablet, Rfl: 1   dicyclomine (BENTYL) 10 MG capsule, Take 1 capsule (10 mg total) by mouth 4 (four) times daily -  before meals and at bedtime. (Patient taking differently: Take 10 mg by mouth in the morning and at bedtime.), Disp: 90 capsule, Rfl: 1   gabapentin (NEURONTIN) 300 MG capsule, Take 1 capsule (300 mg total) by mouth 3 (three) times daily. Take '300mg'$  qhs x1 week then may increase to '300mg'$  TID (Patient taking differently: Take 300 mg by mouth 3 (three) times daily.), Disp: 90 capsule, Rfl: 3   ofloxacin (OCUFLOX) 0.3 % ophthalmic solution, Place 1 drop into the right eye 4 (four) times daily., Disp: 5 mL, Rfl: 0   olmesartan-hydrochlorothiazide (BENICAR HCT) 40-12.5 MG tablet, Take 1 tablet by mouth daily., Disp: 30 tablet, Rfl: 0   ondansetron (ZOFRAN) 4 MG tablet, Take 1 tablet (4 mg total) by mouth every 8 (eight) hours as needed for nausea or vomiting., Disp: 20 tablet, Rfl: 0   Rimegepant Sulfate (NURTEC) 75 MG TBDP, Take 75 mg by mouth daily as needed (migraine)., Disp: 15 tablet, Rfl: 3   rizatriptan (MAXALT) 10 MG tablet, Take 1 tablet (10 mg total) by mouth once for 1 dose. May repeat in 2 hours if needed (Patient taking differently: Take 10 mg by mouth daily as needed for  migraine. May repeat in 2 hours if needed), Disp: 10 tablet, Rfl: 0   traZODone (DESYREL) 100 MG tablet, Take 1 tablet (100 mg total) by mouth at bedtime as needed for sleep., Disp: 90 tablet, Rfl: 1   vitamin B-12 (CYANOCOBALAMIN) 1000 MCG tablet, Take 1,000 mcg by mouth daily., Disp: , Rfl:    Vitamin D, Ergocalciferol, (DRISDOL) 1.25 MG (50000 UNIT) CAPS capsule, Take 1 capsule (50,000 Units total) by mouth every 7 (seven) days., Disp: 12 capsule, Rfl: 0   Medications ordered in this encounter:  No orders of the defined types were placed in this encounter.    *If you need refills on other medications prior to your next appointment, please contact your pharmacy*  Follow-Up: Call back or seek an in-person evaluation if the symptoms worsen or if the condition fails to improve as anticipated.  Other Instructions Please call your PCP for in-office evaluation this morning.  If they are unable to accommodate, then you need to be seen at local urgent care facility (using link below).  IF anything acutely worsens or you note any vision changes, dizziness, etc, you need ER evaluation ASAP.    If you have been instructed to have an in-person evaluation today at a local Urgent Care facility, please use the link below. It will take you to a list of all of our available [facility], including address, phone number and hours of operation. Please do not  delay care.  [facility]  If you or a family member do not have a primary care provider, use the link below to schedule a visit and establish care. When you choose a [facility] primary care physician or advanced practice provider, you gain a long-term partner in health. Find a Primary Care Provider  Learn more about 's in-office and virtual care options: [facility]

## 2020-12-21 NOTE — Progress Notes (Signed)
Virtual Visit Consent   [name], you are scheduled for a virtual visit with a [facility] provider today.     Just as with appointments in the office, your consent must be obtained to participate.  Your consent will be active for this visit and any virtual visit you may have with one of our providers in the next 365 days.     If you have a MyChart account, a copy of this consent can be sent to you electronically.  All virtual visits are billed to your insurance company just like a traditional visit in the office.    As this is a virtual visit, video technology does not allow for your provider to perform a traditional examination.  This may limit your provider's ability to fully assess your condition.  If your provider identifies any concerns that need to be evaluated in person or the need to arrange testing (such as labs, EKG, etc.), we will make arrangements to do so.     Although advances in technology are sophisticated, we cannot ensure that it will always work on either your end or our end.  If the connection with a video visit is poor, the visit may have to be switched to a telephone visit.  With either a video or telephone visit, we are not always able to ensure that we have a secure connection.     I need to obtain your verbal consent now.   Are you willing to proceed with your visit today?    [name] has provided verbal consent on 12/21/2020 for a virtual visit (video or telephone).   [name], [place]   Date: 12/21/2020 8:36 AM   Virtual Visit via Video Note   I, [name], connected with  [name]  ([id number], 06-13-1975) on 12/21/20 at  8:30 AM EDT by a video-enabled telemedicine application and verified that I am speaking with the correct person using two identifiers.  Location: Patient: Virtual Visit Location Patient: Home Provider: Virtual Visit Location Provider: Home Office   I discussed the limitations of evaluation and  management by telemedicine and the availability of in person appointments. The patient expressed understanding and agreed to proceed.    History of Present Illness: [name] is a 45 y.o. who identifies as a female who was assigned female at birth, and is being seen today for migraine headache..Has history of chronic migraines usually alleviated with either OTC medications or prescription nurtec (most recently prescribed). Notes headache starting yesterday and continuing into today. Is 8/10. Started an L-sided unilateral but is now more diffuse. Associated with photophobia, nausea without vomiting. Denies fever, chills. Has taken Tylenol, Goody Powders, Maxalt and two rounds of Nurtec since yesterday evening without any relief. Denies any vision change or AMS.    HPI: HPI  Problems:  Patient Active Problem List   Diagnosis Date Noted   Hypertensive emergency 11/25/2020   Corneal abrasion 11/25/2020   Elevated MCV 11/25/2020   Visual changes 11/25/2020   Migraine 11/15/2020   Elevated liver enzymes 11/02/2020   Peripheral neuropathy 10/11/2020   Accidental drug overdose 08/18/2019   Suicide attempt ([place]) 08/17/2019   Irritable bowel syndrome with diarrhea 02/02/2018   Generalized anxiety disorder 02/02/2018   Perirectal abscess 10/28/2016   Chronic fatigue 01/09/2016   Adrenal nodule ([place]) 01/09/2016   Chronic low back pain 11/21/2015   Benign essential hypertension 08/13/2015   Elevated lipase 12/14/2014   AP (abdominal pain) 12/14/2014  Nausea and vomiting 11/29/2014   Intractable abdominal pain 11/28/2014   Hypokalemia 11/28/2014   HTN (hypertension) 11/28/2014   Pilonidal abscess 12/03/2013   Patellar instability of left knee 06/08/2013    Allergies:  Allergies  Allergen Reactions   Penicillins Anaphylaxis, Swelling and Rash    Has patient had a PCN reaction causing immediate rash, facial/tongue/throat swelling, SOB or lightheadedness with hypotension: Yes Has  patient had a PCN reaction causing severe rash involving mucus membranes or skin necrosis: No Has patient had a PCN reaction that required hospitalization No Has patient had a PCN reaction occurring within the last 10 years: No If all of the above answers are "NO", then may proceed with Cephalosporin use.  Face and throat swell   Medications:  Current Outpatient Medications:    amitriptyline (ELAVIL) 25 MG tablet, Take 1 tablet (25 mg total) by mouth at bedtime., Disp: 30 tablet, Rfl: 0   cyclobenzaprine (FLEXERIL) 10 MG tablet, Take 1 tablet (10 mg total) by mouth 3 (three) times daily as needed for muscle spasms., Disp: 30 tablet, Rfl: 1   dicyclomine (BENTYL) 10 MG capsule, Take 1 capsule (10 mg total) by mouth 4 (four) times daily -  before meals and at bedtime. (Patient taking differently: Take 10 mg by mouth in the morning and at bedtime.), Disp: 90 capsule, Rfl: 1   gabapentin (NEURONTIN) 300 MG capsule, Take 1 capsule (300 mg total) by mouth 3 (three) times daily. Take '300mg'$  qhs x1 week then may increase to '300mg'$  TID (Patient taking differently: Take 300 mg by mouth 3 (three) times daily.), Disp: 90 capsule, Rfl: 3   olmesartan-hydrochlorothiazide (BENICAR HCT) 40-12.5 MG tablet, Take 1 tablet by mouth daily., Disp: 30 tablet, Rfl: 0   Rimegepant Sulfate (NURTEC) 75 MG TBDP, Take 75 mg by mouth daily as needed (migraine)., Disp: 15 tablet, Rfl: 3   traZODone (DESYREL) 100 MG tablet, Take 1 tablet (100 mg total) by mouth at bedtime as needed for sleep., Disp: 90 tablet, Rfl: 1   vitamin B-12 (CYANOCOBALAMIN) 1000 MCG tablet, Take 1,000 mcg by mouth daily., Disp: , Rfl:    Vitamin D, Ergocalciferol, (DRISDOL) 1.25 MG (50000 UNIT) CAPS capsule, Take 1 capsule (50,000 Units total) by mouth every 7 (seven) days., Disp: 12 capsule, Rfl: 0  Observations/Objective: Patient is well-developed, well-nourished in pain. Resting at home.  Head is normocephalic, atraumatic.  No labored  breathing. Speech is clear and coherent with logical content.  Patient is alert and oriented at baseline.   Assessment and Plan: 1. Intractable migraine without aura and without status migrainosus Persistent and unchanged even with multiple OTC and prescription medications. 8/10. Thankfully no alarm signs/symptoms present but giving intractable headache despite aforementioned measures, she needs further evaluation and likely IM abortive therapy. She is to first reach directly out to PCP to see if they can accommodate her this morning. If not, she was sent a link to get set up at one of our urgent cares closest to her. ER precautions reviewed.   Follow Up Instructions: I discussed the assessment and treatment plan with the patient. The patient was provided an opportunity to ask questions and all were answered. The patient agreed with the plan and demonstrated an understanding of the instructions.  A copy of instructions were sent to the patient via MyChart.  The patient was advised to call back or seek an in-person evaluation if the symptoms worsen or if the condition fails to improve as anticipated.  Time:  I spent  12 minutes with the patient via telehealth technology discussing the above problems/concerns.    Erin Rio, PA-C

## 2020-12-25 ENCOUNTER — Ambulatory Visit: Payer: 59 | Admitting: Family Medicine

## 2020-12-27 ENCOUNTER — Encounter: Payer: Self-pay | Admitting: Family Medicine

## 2020-12-27 ENCOUNTER — Other Ambulatory Visit: Payer: Self-pay

## 2020-12-27 ENCOUNTER — Ambulatory Visit: Payer: 59 | Admitting: Family Medicine

## 2020-12-27 VITALS — BP 165/100 | HR 64 | Temp 98.3°F | Ht 71.0 in | Wt 188.2 lb

## 2020-12-27 DIAGNOSIS — G43011 Migraine without aura, intractable, with status migrainosus: Secondary | ICD-10-CM | POA: Diagnosis not present

## 2020-12-27 DIAGNOSIS — I10 Essential (primary) hypertension: Secondary | ICD-10-CM | POA: Diagnosis not present

## 2020-12-27 MED ORDER — PREDNISONE 10 MG (21) PO TBPK: ORAL_TABLET | ORAL | 0 refills | Status: DC

## 2020-12-27 MED ORDER — ONDANSETRON 4 MG PO TBDP
4.0000 mg | ORAL_TABLET | Freq: Once | ORAL | Status: AC
  Administered 2020-12-27: 4 mg via ORAL

## 2020-12-27 MED ORDER — NURTEC 75 MG PO TBDP: 75.0000 mg | ORAL_TABLET | Freq: Every day | ORAL | 3 refills | Status: AC | PRN

## 2020-12-27 MED ORDER — DEXAMETHASONE SODIUM PHOSPHATE 10 MG/ML IJ SOLN
10.0000 mg | Freq: Once | INTRAMUSCULAR | Status: AC
  Administered 2020-12-27: 10 mg via INTRAMUSCULAR

## 2020-12-27 MED ORDER — KETOROLAC TROMETHAMINE 60 MG/2ML IM SOLN
60.0000 mg | Freq: Once | INTRAMUSCULAR | Status: AC
  Administered 2020-12-27: 60 mg via INTRAMUSCULAR

## 2020-12-27 MED ORDER — DEXAMETHASONE SODIUM PHOSPHATE 10 MG/ML IJ SOLN: 10.0000 mg | Freq: Once | INTRAMUSCULAR | Status: DC

## 2020-12-27 MED ORDER — KETOROLAC TROMETHAMINE 60 MG/2ML IM SOLN: 60.0000 mg | Freq: Once | INTRAMUSCULAR | Status: DC

## 2020-12-27 NOTE — Assessment & Plan Note (Signed)
Continued migraine.  Given cocktail of Toradol 60 mg, dexamethasone and Zofran.  She is using quite a bit of caffeine containing medications and over-the-counter analgesics.  I think there may be some rebound contributing.  We will add a steroid taper.  Recommend that she stay well-hydrated and maintain adequate sleep.  Nurtec refilled.

## 2020-12-27 NOTE — Patient Instructions (Signed)
Stay well hydrated.  Be sure to get plenty of sleep. Try to limit caffeine containing medications.  Start steroid taper.  Follow up with me in 4 weeks for blood pressure.

## 2020-12-27 NOTE — Assessment & Plan Note (Signed)
Blood pressure is elevated today.  She is taking medications.  Pain related to headache may be contributing we will plan to have her follow-up in a couple weeks for blood pressure recheck.

## 2020-12-27 NOTE — Progress Notes (Signed)
[name] - 45 y.o. female MRN [id number]  Date of birth: 09/15/1975  Subjective Chief Complaint  Patient presents with   Migraine    HPI [name] is a 45 year old female here today with complaint of headache.  She does have history of migraines and has had current headache for about 2 weeks.  She was seen at urgent care and given Toradol.  She is also given prescription for Fioricet.  In addition to Fioricet she has been using Goody powders.  Her blood pressure is elevated however she feels like pain, headache because they recently increased.  She is taking her blood pressure medicine as directed.  She does get temporary relief from Fioricet however continues to have headache.  She does have associated nausea and light sensitivity.  She denies any fever or chills.  She has gotten good relief with Nurtec however she is only given a limited supply this each month due to insurance restrictions.  ROS:  A comprehensive ROS was completed and negative except as noted per HPI    Past Medical History:  Diagnosis Date   Adnexal cyst 2013   right   Anemia 2015   Pernicious and iron deficiency. As of 2015/2016 no longer requiring po iron or B12 shots.    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 arthroscopy x 2.    Colitis    Headache(784.0)    migraines   Hydradenitis 2001   axillary, multiple surgical I & Ds/excisions.   Hypertension    Nausea & vomiting 11/28/2014   Obesity (BMI 30.0-34.9)    Pilonidal cyst    Pityriasis rosea    pt also gives hx of seborrheic dermatitis.     Past Surgical History:  Procedure Laterality Date   ABDOMINAL HYSTERECTOMY     CYSTECTOMY     2004 axilla bil   ESOPHAGOGASTRODUODENOSCOPY N/A 11/30/2014   Procedure: ESOPHAGOGASTRODUODENOSCOPY (EGD);  Surgeon: Jerene Bears, MD;  Location: [facility] ENDOSCOPY;  Service: Endoscopy;  Laterality: N/A;   FRACTURE SURGERY     rt arm   GASTRIC BYPASS  2011   gastric sleeve   KNEE SURGERY  2005   left   LAPAROSCOPIC  CHOLECYSTECTOMY  2002   MEDIAL PATELLOFEMORAL LIGAMENT REPAIR Left 06/08/2013   Procedure: DIAGNOSTIC OPERATIVE ARTHROSCOPY, OPEN MEDIAL PATELLA FEMORAL LIGAMENT RECONSTRUCTION;  Surgeon: Meredith Pel, MD;  Location: [place];  Service: Orthopedics;  Laterality: Left;  with Hamstring autograft   OVARIAN CYST REMOVAL     PARTIAL HYSTERECTOMY     R arm surgery      Social History   Socioeconomic History   Marital status: Single    Spouse name: Not on file   Number of children: Not on file   Years of education: Not on file   Highest education level: Not on file  Occupational History   Occupation: Writer    Employer: [employer]    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   Tobacco Use   Smoking status: Every Day    Packs/day: 0.50    Years: 10.00    Pack years: 5.00    Types: Cigarettes    Start date: 05/20/2013   Smokeless tobacco: Never  Vaping Use   Vaping Use: Never used  Substance and Sexual Activity   Alcohol use: Yes    Alcohol/week: 1.0 - 2.0 standard drink    Types: 1 - 2 Standard drinks or equivalent per week    Comment: occ   Drug use: Yes  Types: Other-see comments    Comment: CBD rub   Sexual activity: Yes    Partners: Male    Birth control/protection: Surgical  Other Topics Concern   Not on file  Social History Narrative   Not on file   Social Determinants of Health   Financial Resource Strain: Not on file  Food Insecurity: Not on file  Transportation Needs: Not on file  Physical Activity: Not on file  Stress: Not on file  Social Connections: Not on file    Family History  Problem Relation Age of Onset   Lupus Mother    Stroke Mother    Hypertension Mother    Crohn's disease Father    Hypertension Father    Gallbladder disease Father    Cancer Maternal Grandfather        lung   COPD Maternal Grandfather    Emphysema Maternal Grandfather    Lung cancer Maternal Grandmother    Stroke Maternal Grandmother     Coronary artery disease Paternal Grandfather     Health Maintenance  Topic Date Due   Pneumococcal Vaccine 39-45 Years old (1 - PCV) Never done   PAP SMEAR-Modifier  Never done   COLONOSCOPY (Pts 45-61yr Insurance coverage will need to be confirmed)  Never done   COVID-19 Vaccine (3 - Booster for Moderna series) 10/04/2020   INFLUENZA VACCINE  12/18/2020   TETANUS/TDAP  09/05/2024   Hepatitis C Screening  Completed   HIV Screening  Completed   HPV VACCINES  Aged Out     ----------------------------------------------------------------------------------------------------------------------------------------------------------------------------------------------------------------- Physical Exam BP (!) 165/100 (BP Location: Left Arm, Patient Position: Sitting, Cuff Size: Normal)   Pulse 64   Temp 98.3 F (36.8 C)   Ht '5\' 11"'$  (1.803 m)   Wt 188 lb 3.2 oz (85.4 kg)   SpO2 99%   BMI 26.25 kg/m   Physical Exam Constitutional:      Appearance: Normal appearance.  HENT:     Head: Normocephalic and atraumatic.  Eyes:     General: No scleral icterus. Cardiovascular:     Rate and Rhythm: Normal rate and regular rhythm.  Musculoskeletal:     Cervical back: Neck supple.  Skin:    General: Skin is warm and dry.  Neurological:     General: No focal deficit present.     Mental Status: She is alert.     Cranial Nerves: No cranial nerve deficit.  Psychiatric:        Mood and Affect: Mood normal.        Behavior: Behavior normal.    ------------------------------------------------------------------------------------------------------------------------------------------------------------------------------------------------------------------- Assessment and Plan  Migraine Continued migraine.  Given cocktail of Toradol 60 mg, dexamethasone and Zofran.  She is using quite a bit of caffeine containing medications and over-the-counter analgesics.  I think there may be some rebound  contributing.  We will add a steroid taper.  Recommend that she stay well-hydrated and maintain adequate sleep.  Nurtec refilled.  Benign essential hypertension Blood pressure is elevated today.  She is taking medications.  Pain related to headache may be contributing we will plan to have her follow-up in a couple weeks for blood pressure recheck.   Meds ordered this encounter  Medications   Rimegepant Sulfate (NURTEC) 75 MG TBDP    Sig: Take 75 mg by mouth daily as needed (migraine).    Dispense:  15 tablet    Refill:  3   predniSONE (STERAPRED UNI-PAK 21 TAB) 10 MG (21) TBPK tablet    Sig: Taper as  directed on packaging    Dispense:  21 tablet    Refill:  0   DISCONTD: dexamethasone (DECADRON) injection 10 mg   DISCONTD: ketorolac (TORADOL) injection 60 mg   dexamethasone (DECADRON) injection 10 mg   ketorolac (TORADOL) injection 60 mg   ondansetron (ZOFRAN-ODT) disintegrating tablet 4 mg    Return in about 4 weeks (around 01/24/2021) for elevated blood pressure.    This visit occurred during the SARS-CoV-2 public health emergency.  Safety protocols were in place, including screening questions prior to the visit, additional usage of staff PPE, and extensive cleaning of exam room while observing appropriate contact time as indicated for disinfecting solutions.

## 2021-01-19 ENCOUNTER — Ambulatory Visit (INDEPENDENT_AMBULATORY_CARE_PROVIDER_SITE_OTHER): Payer: 59 | Admitting: Diagnostic Neuroimaging

## 2021-01-19 ENCOUNTER — Encounter: Payer: Self-pay | Admitting: Diagnostic Neuroimaging

## 2021-01-19 VITALS — BP 174/106 | HR 72 | Ht 72.0 in | Wt 194.0 lb

## 2021-01-19 DIAGNOSIS — G629 Polyneuropathy, unspecified: Secondary | ICD-10-CM

## 2021-01-19 DIAGNOSIS — G43109 Migraine with aura, not intractable, without status migrainosus: Secondary | ICD-10-CM

## 2021-01-19 MED ORDER — AMITRIPTYLINE HCL 50 MG PO TABS: 50.0000 mg | ORAL_TABLET | Freq: Every day | ORAL | 4 refills | Status: DC

## 2021-01-19 MED ORDER — GABAPENTIN 300 MG PO CAPS: 300.0000 mg | ORAL_CAPSULE | Freq: Three times a day (TID) | ORAL | 6 refills | Status: DC

## 2021-01-19 MED ORDER — ALPRAZOLAM 0.5 MG PO TABS: ORAL_TABLET | ORAL | 0 refills | Status: DC

## 2021-01-19 NOTE — Patient Instructions (Signed)
NUMBNESS / BURNING / PAIN IN FEET (history of pernicious anemia and gastric sleeve surgery) - check EMG/NCS (nerve pain) - check MRI lumbar spine - check neuropathy labs - increase gabapentin up to 600-'900mg'$  three times a day - consider capsaicin cream, lidocaine patch / cream, alpha-lipoic acid '600mg'$  daily   MIGRAINE WITH AURA  MIGRAINE PREVENTION  LIFESTYLE CHANGES -Stop or avoid smoking -Decrease or avoid caffeine / alcohol -Eat and sleep on a regular schedule -Exercise several times per week - may increase amitriptyline to '50mg'$  at bedtime    MIGRAINE RESCUE  - ibuprofen, tylenol as needed - continue rimegepant (Nurtec) '75mg'$  as needed for breakthrough headache; max 8 per month   STOP fioricet, flexeril, trazodone

## 2021-01-19 NOTE — Progress Notes (Signed)
[facility]  PATIENT: [name] DOB: [birth year]  REFERRING CLINICIAN: Luetta Nutting, DO HISTORY FROM: patient  REASON FOR VISIT: new consult    HISTORICAL  CHIEF COMPLAINT:  Chief Complaint  Patient presents with   New Patient (Initial Visit)    RM 6, alone. C/o neuropathic pain in bilateral feet since 2019 that is worsening. Gabapentin helps minimally. Also c/o leg leg pain, worried it may be sciatica.    HISTORY OF PRESENT ILLNESS:   45 year old female here for evaluation neuropathy and migraine.  2017 patient had onset of numbness in the right great toes and spread to the rest of the foot over a few months to few years.  Eventually she had numbness and pain in bilateral feet and ankles.  Now symptoms have spread up to the level of the mid calves.  She has numbness, pain, tingling, burning and needles sensation.  Patient also has chronic low back pain for past 20 years and has had MRIs in the past which apparently showed abnormalities for which he was recommended to have low back surgery.  She has not had any procedures or surgeries yet.  Patient also has history of gastric sleeve procedure, pernicious anemia, B12 deficiency, iron deficiency, and is on replacement.  She has family history of lupus and rheumatoid arthritis in mother.  Patient has been using gabapentin 300 mg 3 times a day with mild relief.  Patient also has history of migraine headaches since 6.  With throbbing pain, sparkling visual phenomenon, nausea and sensitivity to light and sound.  She has tried topiramate, Maxalt, Nurtec, amitriptyline, Goody powder, ibuprofen, Tylenol without relief.   REVIEW OF SYSTEMS: Full 14 system review of systems performed and negative with exception of: as per HPI  ALLERGIES: Allergies  Allergen Reactions   Penicillins Anaphylaxis, Swelling and Rash    Has patient had a PCN reaction causing immediate rash, facial/tongue/throat swelling, SOB or  lightheadedness with hypotension: Yes Has patient had a PCN reaction causing severe rash involving mucus membranes or skin necrosis: No Has patient had a PCN reaction that required hospitalization No Has patient had a PCN reaction occurring within the last 10 years: No If all of the above answers are "NO", then may proceed with Cephalosporin use.  Face and throat swell    HOME MEDICATIONS: Outpatient Medications Prior to Visit  Medication Sig Dispense Refill   dicyclomine (BENTYL) 10 MG capsule Take 1 capsule (10 mg total) by mouth 4 (four) times daily -  before meals and at bedtime. (Patient taking differently: Take 10 mg by mouth in the morning and at bedtime.) 90 capsule 1   olmesartan-hydrochlorothiazide (BENICAR HCT) 40-12.5 MG tablet Take 1 tablet by mouth daily. 30 tablet 0   predniSONE (STERAPRED UNI-PAK 21 TAB) 10 MG (21) TBPK tablet Taper as directed on packaging 21 tablet 0   Rimegepant Sulfate (NURTEC) 75 MG TBDP Take 75 mg by mouth daily as needed (migraine). 15 tablet 3   vitamin B-12 (CYANOCOBALAMIN) 1000 MCG tablet Take 1,000 mcg by mouth daily.     Vitamin D, Ergocalciferol, (DRISDOL) 1.25 MG (50000 UNIT) CAPS capsule Take 1 capsule (50,000 Units total) by mouth every 7 (seven) days. 12 capsule 0   amitriptyline (ELAVIL) 25 MG tablet Take 1 tablet (25 mg total) by mouth at bedtime. 30 tablet 0   butalbital-acetaminophen-caffeine (FIORICET) 50-325-40 MG tablet Take 1 tablet by mouth every 6 (six) hours as needed for headache. 24 tablet 0   cyclobenzaprine (FLEXERIL) 10  MG tablet Take 1 tablet (10 mg total) by mouth 3 (three) times daily as needed for muscle spasms. 30 tablet 1   gabapentin (NEURONTIN) 300 MG capsule Take 1 capsule (300 mg total) by mouth 3 (three) times daily. Take '300mg'$  qhs x1 week then may increase to '300mg'$  TID (Patient taking differently: Take 300 mg by mouth 3 (three) times daily.) 90 capsule 3   traZODone (DESYREL) 100 MG tablet Take 1 tablet (100 mg total)  by mouth at bedtime as needed for sleep. 90 tablet 1   No facility-administered medications prior to visit.    PAST MEDICAL HISTORY: Past Medical History:  Diagnosis Date   Adnexal cyst 2013   right   Anemia 2015   Pernicious and iron deficiency. As of 2015/2016 no longer requiring po iron or B12 shots.    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 arthroscopy x 2.    Colitis    Headache(784.0)    migraines   Hydradenitis 2001   axillary, multiple surgical I & Ds/excisions.   Hypertension    Nausea & vomiting 11/28/2014   Obesity (BMI 30.0-34.9)    Pilonidal cyst    Pityriasis rosea    pt also gives hx of seborrheic dermatitis.     PAST SURGICAL HISTORY: Past Surgical History:  Procedure Laterality Date   ABDOMINAL HYSTERECTOMY     CYSTECTOMY     2004 axilla bil   ESOPHAGOGASTRODUODENOSCOPY N/A 11/30/2014   Procedure: ESOPHAGOGASTRODUODENOSCOPY (EGD);  Surgeon: Jerene Bears, MD;  Location: [facility] ENDOSCOPY;  Service: Endoscopy;  Laterality: N/A;   FRACTURE SURGERY     rt arm   GASTRIC BYPASS  2011   gastric sleeve   KNEE SURGERY  2005   left   LAPAROSCOPIC CHOLECYSTECTOMY  2002   MEDIAL PATELLOFEMORAL LIGAMENT REPAIR Left 06/08/2013   Procedure: DIAGNOSTIC OPERATIVE ARTHROSCOPY, OPEN MEDIAL PATELLA FEMORAL LIGAMENT RECONSTRUCTION;  Surgeon: Meredith Pel, MD;  Location: [place];  Service: Orthopedics;  Laterality: Left;  with Hamstring autograft   OVARIAN CYST REMOVAL     PARTIAL HYSTERECTOMY     R arm surgery      FAMILY HISTORY: Family History  Problem Relation Age of Onset   Lupus Mother    Stroke Mother    Hypertension Mother    Crohn's disease Father    Hypertension Father    Gallbladder disease Father    Cancer Maternal Grandfather        lung   COPD Maternal Grandfather    Emphysema Maternal Grandfather    Lung cancer Maternal Grandmother    Stroke Maternal Grandmother    Coronary artery disease Paternal Grandfather     SOCIAL HISTORY: Social History    Socioeconomic History   Marital status: Single    Spouse name: Not on file   Number of children: Not on file   Years of education: Not on file   Highest education level: Not on file  Occupational History   Occupation: Insurance account manager: [employer]    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   Tobacco Use   Smoking status: Every Day    Packs/day: 0.50    Years: 10.00    Pack years: 5.00    Types: Cigarettes    Start date: 05/20/2013   Smokeless tobacco: Never  Vaping Use   Vaping Use: Never used  Substance and Sexual Activity   Alcohol use: Yes    Alcohol/week: 1.0 - 2.0 standard drink  Types: 1 - 2 Standard drinks or equivalent per week    Comment: occ   Drug use: Yes    Types: Other-see comments    Comment: CBD rub   Sexual activity: Yes    Partners: Male    Birth control/protection: Surgical  Other Topics Concern   Not on file  Social History Narrative   Not on file   Social Determinants of Health   Financial Resource Strain: Not on file  Food Insecurity: Not on file  Transportation Needs: Not on file  Physical Activity: Not on file  Stress: Not on file  Social Connections: Not on file  Intimate Partner Violence: Not on file     PHYSICAL EXAM  GENERAL EXAM/CONSTITUTIONAL: Vitals:  Vitals:   01/19/21 0809  BP: (!) 174/106  Pulse: 72  Weight: 194 lb (88 kg)  Height: 6' (1.829 m)   Body mass index is 26.31 kg/m. Wt Readings from Last 3 Encounters:  01/19/21 194 lb (88 kg)  12/27/20 188 lb 3.2 oz (85.4 kg)  11/25/20 175 lb 7.8 oz (79.6 kg)   Patient is in no distress; well developed, nourished and groomed; neck is supple  CARDIOVASCULAR: Examination of carotid arteries is normal; no carotid bruits Regular rate and rhythm, no murmurs Examination of peripheral vascular system by observation and palpation is normal  EYES: Ophthalmoscopic exam of optic discs and posterior segments is normal; no papilledema or  hemorrhages No results found.  MUSCULOSKELETAL: Gait, strength, tone, movements noted in Neurologic exam below  NEUROLOGIC: MENTAL STATUS:  No flowsheet data found. awake, alert, oriented to person, place and time recent and remote memory intact normal attention and concentration language fluent, comprehension intact, naming intact fund of knowledge appropriate  CRANIAL NERVE:  2nd - no papilledema on fundoscopic exam 2nd, 3rd, 4th, 6th - pupils equal and reactive to light, visual fields full to confrontation, extraocular muscles intact, no nystagmus 5th - facial sensation symmetric 7th - facial strength symmetric 8th - hearing intact 9th - palate elevates symmetrically, uvula midline 11th - shoulder shrug symmetric 12th - tongue protrusion midline  MOTOR:  normal bulk and tone, full strength in the BUE, BLE  SENSORY:  normal and symmetric to light touch, pinprick, temperature, vibration; EXCEPT DECR IN FEET T PP AND TEMP  COORDINATION:  finger-nose-finger, fine finger movements normal  REFLEXES:  deep tendon reflexes --> BUE TRACE; KNEES TRACE; ANKLES ABSENT  GAIT/STATION:  narrow based gait; ANTALGIC GAIT     DIAGNOSTIC DATA (LABS, IMAGING, TESTING) - I reviewed patient records, labs, notes, testing and imaging myself where available.  Lab Results  Component Value Date   WBC 6.5 11/24/2020   HGB 13.5 11/24/2020   HCT 40.9 11/24/2020   MCV 101.0 (H) 11/24/2020   PLT 231 11/24/2020      Component Value Date/Time   NA 145 11/24/2020 2313   K 3.3 (L) 11/24/2020 2313   CL 110 11/24/2020 2313   CO2 28 11/24/2020 2313   GLUCOSE 93 11/24/2020 2313   BUN 8 11/24/2020 2313   CREATININE 0.63 11/24/2020 2313   CREATININE 0.66 10/05/2020 0000   CALCIUM 8.7 (L) 11/24/2020 2313   PROT 5.7 (L) 11/24/2020 2313   ALBUMIN 3.1 (L) 11/24/2020 2313   AST 23 11/24/2020 2313   ALT 23 11/24/2020 2313   ALKPHOS 180 (H) 11/24/2020 2313   BILITOT 0.4 11/24/2020 2313    GFRNONAA >60 11/24/2020 2313   GFRNONAA 107 10/05/2020 0000   GFRAA 124 10/05/2020 0000  Lab Results  Component Value Date   CHOL 91 11/29/2014   HDL 33 (L) 11/29/2014   LDLCALC 48 11/29/2014   TRIG 48 11/29/2014   CHOLHDL 2.8 11/29/2014   Lab Results  Component Value Date   HGBA1C 4.5 10/05/2020   Lab Results  Component Value Date   VITAMINB12 221 11/25/2020   Lab Results  Component Value Date   TSH 0.562 11/25/2020    11/25/20 MRI brain / MRV head 1. No evidence of acute intracranial abnormality. Specifically, no acute infarct. 2. No evidence of dural venous sinus thrombosis.   ASSESSMENT AND PLAN  45 y.o. year old female here with numbness and burning pain in feet since 2017, chronic low back pain since 2000, and migraines since 45 years old.  Meds tried: maxalt, topiramate, amitriptyline, gabapentin, Goody powder, Tylenol, ibuprofen, Nurtec, Fioricet, Flexeril, trazodone   Dx:  1. Neuropathy   2. Migraine with aura and without status migrainosus, not intractable       PLAN:  NUMBNESS / BURNING / PAIN IN FEET (history of pernicious anemia and gastric sleeve surgery) - check EMG/NCS - check MRI lumbar spine - check neuropathy labs - increase gabapentin up to 600-'900mg'$  three times a day - consider duloxetine 30-'60mg'$  daily, amitriptyline 25-'50mg'$  at bedtime - consider capsaicin cream, lidocaine patch / cream, alpha-lipoic acid '600mg'$  daily   MIGRAINE WITH AURA  MIGRAINE PREVENTION  LIFESTYLE CHANGES -Stop or avoid smoking -Decrease or avoid caffeine / alcohol -Eat and sleep on a regular schedule -Exercise several times per week - may increase amitriptyline to '50mg'$  at bedtime  Consider 2nd line - erenumab (Aimovig) '70mg'$  monthly (may increase to '140mg'$  monthly) - fremanezumab (Ajovy) '225mg'$  monthly (or '675mg'$  every 3 months) - galazanezumab (Emgality) '240mg'$  loading dose; then '120mg'$  monthly   MIGRAINE RESCUE  - ibuprofen, tylenol as needed - continue  rimegepant (Nurtec) '75mg'$  as needed for breakthrough headache; max 8 per month  STOP fioricet, flexeril, trazodone.  Orders Placed This Encounter  Procedures   MR LUMBAR SPINE WO CONTRAST   CBC with diff   CMP   Vitamin B12   MMA   Homocysteine   SPEP with IFE   ANA w/Reflex   SSA, SSB   NCV with EMG(electromyography)    Meds ordered this encounter  Medications   amitriptyline (ELAVIL) 50 MG tablet    Sig: Take 1 tablet (50 mg total) by mouth at bedtime.    Dispense:  90 tablet    Refill:  4   gabapentin (NEURONTIN) 300 MG capsule    Sig: Take 1-3 capsules (300-900 mg total) by mouth 3 (three) times daily. Take '300mg'$  qhs x1 week then may increase to '300mg'$  TID    Dispense:  270 capsule    Refill:  6   Return for for NCV/EMG.    Penni Bombard, MD [id number], 0000000 AM Certified in Neurology, Neurophysiology and Neuroimaging  [facility] [address] [place], [place] [address] [phone]

## 2021-01-24 ENCOUNTER — Encounter: Payer: Self-pay | Admitting: Family Medicine

## 2021-01-24 ENCOUNTER — Ambulatory Visit (INDEPENDENT_AMBULATORY_CARE_PROVIDER_SITE_OTHER): Payer: 59 | Admitting: Family Medicine

## 2021-01-24 VITALS — BP 159/91 | HR 71 | Temp 98.0°F | Ht 71.0 in | Wt 194.0 lb

## 2021-01-24 DIAGNOSIS — E538 Deficiency of other specified B group vitamins: Secondary | ICD-10-CM | POA: Insufficient documentation

## 2021-01-24 DIAGNOSIS — Z23 Encounter for immunization: Secondary | ICD-10-CM | POA: Diagnosis not present

## 2021-01-24 DIAGNOSIS — I10 Essential (primary) hypertension: Secondary | ICD-10-CM | POA: Diagnosis not present

## 2021-01-24 DIAGNOSIS — G43011 Migraine without aura, intractable, with status migrainosus: Secondary | ICD-10-CM | POA: Diagnosis not present

## 2021-01-24 DIAGNOSIS — D510 Vitamin B12 deficiency anemia due to intrinsic factor deficiency: Secondary | ICD-10-CM | POA: Diagnosis not present

## 2021-01-24 MED ORDER — "SYRINGE 25G X 1-1/2"" 3 ML MISC": 0 refills | Status: DC

## 2021-01-24 MED ORDER — OLMESARTAN MEDOXOMIL-HCTZ 40-12.5 MG PO TABS: 1.0000 | ORAL_TABLET | Freq: Every day | ORAL | 2 refills | Status: DC

## 2021-01-24 MED ORDER — CYANOCOBALAMIN 1000 MCG/ML IJ SOLN: 1000.0000 ug | INTRAMUSCULAR | 3 refills | Status: AC

## 2021-01-24 MED ORDER — CYANOCOBALAMIN 1000 MCG/ML IJ SOLN
1000.0000 ug | Freq: Once | INTRAMUSCULAR | Status: AC
  Administered 2021-01-24: 1000 ug via INTRAMUSCULAR

## 2021-01-24 NOTE — Progress Notes (Signed)
[name] - 45 y.o. female MRN [id number]  Date of birth: 18-Jan-1976  Subjective Chief Complaint  Patient presents with   Hypertension    HPI [name] is a 45 year old female here today for follow-up of hypertension and migraines.  Hypertension is currently managed with olmesartan/hydrochlorothiazide at 40/12.5 mg daily.  She is taking medication each day.  Blood pressures at home have been much better controlled.  She denies side effects related to medication.  She denies chest pain, shortness of breath, palpitations or vision changes.  She has had improvement in her migraines.  She is seeing neurology as well for migraines.  She was recently started on amitriptyline.  She also finds Nurtec to be helpful.  She is not needing Nurtec as often now.  She does continue to have problems with neuropathy.  Recent B12 level was low.  She does have history of gastric bypass with malabsorption and pernicious anemia.  ROS:  A comprehensive ROS was completed and negative except as noted per HPI  Allergies  Allergen Reactions   Penicillins Anaphylaxis, Swelling and Rash    Has patient had a PCN reaction causing immediate rash, facial/tongue/throat swelling, SOB or lightheadedness with hypotension: Yes Has patient had a PCN reaction causing severe rash involving mucus membranes or skin necrosis: No Has patient had a PCN reaction that required hospitalization No Has patient had a PCN reaction occurring within the last 10 years: No If all of the above answers are "NO", then may proceed with Cephalosporin use.  Face and throat swell    Past Medical History:  Diagnosis Date   Adnexal cyst 2013   right   Anemia 2015   Pernicious and iron deficiency. As of 2015/2016 no longer requiring po iron or B12 shots.    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 arthroscopy x 2.    Colitis    Headache(784.0)    migraines   Hydradenitis 2001   axillary, multiple surgical I & Ds/excisions.   Hypertension     Nausea & vomiting 11/28/2014   Obesity (BMI 30.0-34.9)    Pilonidal cyst    Pityriasis rosea    pt also gives hx of seborrheic dermatitis.     Past Surgical History:  Procedure Laterality Date   ABDOMINAL HYSTERECTOMY     CYSTECTOMY     2004 axilla bil   ESOPHAGOGASTRODUODENOSCOPY N/A 11/30/2014   Procedure: ESOPHAGOGASTRODUODENOSCOPY (EGD);  Surgeon: Jerene Bears, MD;  Location[facility] END[facility]COPY;  Service: Endoscopy;  Laterality: N/A;   FRACTURE SURGERY     rt arm   GASTRIC BYPASS  2011   gastric sleeve   KNEE SURGERY  2005   left   LAPAROSCOPIC CHOLECYSTECTOMY  2002   MEDIAL PATELLOFEMORAL LIGAMENT REPAIR Left 06/08/2013   Procedure: DIAGNOSTIC OPERATIVE ARTHROSCOPY, OPEN MEDIAL PATELLA FEMORAL LIGAMENT RECONSTRUCTION;  Surgeon: Meredith Pel, MD;  Location: [facility];  Service: Orthopedics;  Laterality: Left;  with Hamstring autograft   OVARIAN CYST REMOVAL     PARTIAL HYSTERECTOMY     R arm surgery      Social History   Socioeconomic History   Marital status: Single    Spouse name: Not on file   Number of children: Not on file   Years of education: Not on file   Highest education level: Not on file  Occupational History   Occupation: Writer    Employer: [employer]    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   Tobacco Use   Smoking status:  Every Day    Packs/day: 0.50    Years: 10.00    Pack years: 5.00    Types: Cigarettes    Start date: 05/20/2013   Smokeless tobacco: Never  Vaping Use   Vaping Use: Never used  Substance and Sexual Activity   Alcohol use: Yes    Alcohol/week: 1.0 - 2.0 standard drink    Types: 1 - 2 Standard drinks or equivalent per week    Comment: occ   Drug use: Yes    Types: Other-see comments    Comment: CBD rub   Sexual activity: Yes    Partners: Male    Birth control/protection: Surgical  Other Topics Concern   Not on file  Social History Narrative   Not on file   Social Determinants of Health    Financial Resource Strain: Not on file  Food Insecurity: Not on file  Transportation Needs: Not on file  Physical Activity: Not on file  Stress: Not on file  Social Connections: Not on file    Family History  Problem Relation Age of Onset   Lupus Mother    Stroke Mother    Hypertension Mother    Crohn's disease Father    Hypertension Father    Gallbladder disease Father    Cancer Maternal Grandfather        lung   COPD Maternal Grandfather    Emphysema Maternal Grandfather    Lung cancer Maternal Grandmother    Stroke Maternal Grandmother    Coronary artery disease Paternal Grandfather     Health Maintenance  Topic Date Due   PAP SMEAR-Modifier  01/24/2021 (Originally 09/09/1996)   Pneumococcal Vaccine 69-39 Years old (1 - PCV) 01/24/2022 (Originally 09/09/1981)   COLONOSCOPY (Pts 45-34yr Insurance coverage will need to be confirmed)  01/24/2022 (Originally 09/09/2020)   TETANUS/TDAP  09/05/2024   INFLUENZA VACCINE  Completed   COVID-19 Vaccine  Completed   Hepatitis C Screening  Completed   HIV Screening  Completed   HPV VACCINES  Aged Out     ----------------------------------------------------------------------------------------------------------------------------------------------------------------------------------------------------------------- Physical Exam BP (!) 159/91   Pulse 71   Temp 98 F (36.7 C)   Ht '5\' 11"'$  (1.803 m)   Wt 194 lb (88 kg)   SpO2 100%   BMI 27.06 kg/m   Physical Exam Constitutional:      Appearance: Normal appearance.  HENT:     Head: Normocephalic and atraumatic.  Eyes:     General: No scleral icterus. Cardiovascular:     Rate and Rhythm: Normal rate and regular rhythm.  Abdominal:     General: Abdomen is flat.     Palpations: Abdomen is soft.  Musculoskeletal:     Cervical back: Neck supple.  Neurological:     General: No focal deficit present.     Mental Status: She is alert.  Psychiatric:        Mood and Affect:  Mood normal.        Behavior: Behavior normal.    ------------------------------------------------------------------------------------------------------------------------------------------------------------------------------------------------------------------- Assessment and Plan  Benign essential hypertension Blood pressure elevated at today's appointment however readings at home have been much better controlled.  She has not taken her medication yet today.  I recommend that she continue current medications as well as a low-sodium diet.  Migraine Currently on amitriptyline as well as Nurtec as needed.  Migraines are better controlled and seem to be improved with better blood pressure control as well.  She will continue to see neurology for management as well.  B12  deficiency Possibly contributing to her neuropathy.  Starting B12 injections monthly.  Given first injection today.  She feels comfortable doing these at home as well.  We will plan to recheck levels in 6 months.   Meds ordered this encounter  Medications   olmesartan-hydrochlorothiazide (BENICAR HCT) 40-12.5 MG tablet    Sig: Take 1 tablet by mouth daily.    Dispense:  90 tablet    Refill:  2   cyanocobalamin (,VITAMIN B-12,) 1000 MCG/ML injection    Sig: Inject 1 mL (1,000 mcg total) into the muscle every 30 (thirty) days.    Dispense:  3 mL    Refill:  3   Syringe/Needle, Disp, (SYRINGE 3CC/25GX1-1/2") 25G X 1-1/2" 3 ML MISC    Sig: Use to inject B12    Dispense:  25 each    Refill:  0   cyanocobalamin ((VITAMIN B-12)) injection 1,000 mcg    Return in about 6 months (around 07/24/2021) for HTN.    This visit occurred during the SARS-CoV-2 public health emergency.  Safety protocols were in place, including screening questions prior to the visit, additional usage of staff PPE, and extensive cleaning of exam room while observing appropriate contact time as indicated for disinfecting solutions.

## 2021-01-24 NOTE — Assessment & Plan Note (Signed)
Blood pressure elevated at today's appointment however readings at home have been much better controlled.  She has not taken her medication yet today.  I recommend that she continue current medications as well as a low-sodium diet.

## 2021-01-24 NOTE — Assessment & Plan Note (Signed)
Currently on amitriptyline as well as Nurtec as needed.  Migraines are better controlled and seem to be improved with better blood pressure control as well.  She will continue to see neurology for management as well.

## 2021-01-24 NOTE — Patient Instructions (Addendum)
Great to see you today! Continue current medications.  Let's start B12 injections monthly Work on reduction in smoking and salt intake See me again in 6 months.     Influenza (Flu) Vaccine (Inactivated or Recombinant): What You Need to Know 1. Why get vaccinated? Influenza vaccine can prevent influenza (flu). Flu is a contagious disease that spreads around the [place] every year, usually between October and May. Anyone can get the flu, but it is more dangerous for some people. Infants and young children, people 78 years and older, pregnant people, and people with certain health conditions or a weakened immune system are at greatest risk of flu complications. Pneumonia, bronchitis, sinus infections, and ear infections are examples of flu-related complications. If you have a medical condition, such as heart disease, cancer, or diabetes, flu can make it worse. Flu can cause fever and chills, sore throat, muscle aches, fatigue, cough, headache, and runny or stuffy nose. Some people may have vomiting and diarrhea, though this is more common in children than adults. In an average year, thousands of people in the [place] die from flu, and many more are hospitalized. Flu vaccine prevents millions of illnesses and flu-related visits to the doctor each year. 2. Influenza vaccines CDC recommends everyone 6 months and older get vaccinated every flu season. Children 6 months through 4 years of age may need 2 doses during a single flu season. Everyone else needs only 1 dose each flu season. It takes about 2 weeks for protection to develop after vaccination. There are many flu viruses, and they are always changing. Each year a new flu vaccine is made to protect against the influenza viruses believed to be likely to cause disease in the upcoming flu season. Even when the vaccine doesn't exactly match these viruses, it may still provide some protection. Influenza vaccine does not cause flu. Influenza  vaccine may be given at the same time as other vaccines. 3. Talk with your health care provider Tell your vaccination provider if the person getting the vaccine: Has had an allergic reaction after a previous dose of influenza vaccine, or has any severe, life-threatening allergies Has ever had Guillain-Barr Syndrome (also called "GBS") In some cases, your health care provider may decide to postpone influenza vaccination until a future visit. Influenza vaccine can be administered at any time during pregnancy. People who are or will be pregnant during influenza season should receive inactivated influenza vaccine. People with minor illnesses, such as a cold, may be vaccinated. People who are moderately or severely ill should usually wait until they recover before getting influenza vaccine. Your health care provider can give you more information. 4. Risks of a vaccine reaction Soreness, redness, and swelling where the shot is given, fever, muscle aches, and headache can happen after influenza vaccination. There may be a very small increased risk of Guillain-Barr Syndrome (GBS) after inactivated influenza vaccine (the flu shot). Young children who get the flu shot along with pneumococcal vaccine (PCV13) and/or DTaP vaccine at the same time might be slightly more likely to have a seizure caused by fever. Tell your health care provider if a child who is getting flu vaccine has ever had a seizure. People sometimes faint after medical procedures, including vaccination. Tell your provider if you feel dizzy or have vision changes or ringing in the ears. As with any medicine, there is a very remote chance of a vaccine causing a severe allergic reaction, other serious injury, or death. 5. What if there is a  serious problem? An allergic reaction could occur after the vaccinated person leaves the clinic. If you see signs of a severe allergic reaction (hives, swelling of the face and throat, difficulty breathing, a  fast heartbeat, dizziness, or weakness), call 9-1-1 and get the person to the nearest hospital. For other signs that concern you, call your health care provider. Adverse reactions should be reported to the Vaccine Adverse Event Reporting System (VAERS). Your health care provider will usually file this report, or you can do it yourself. Visit the VAERS website at www.vaers.SamedayNews.es or call 564 363 0369. VAERS is only for reporting reactions, and VAERS staff members do not give medical advice. 6. The National Vaccine Injury Compensation Program The [employer] Vaccine Injury Compensation Program (VICP) is a federal program that was created to compensate people who may have been injured by certain vaccines. Claims regarding alleged injury or death due to vaccination have a time limit for filing, which may be as short as two years. Visit the VICP website at GoldCloset.com.ee or call 567-572-6070 to learn about the program and about filing a claim. 7. How can I learn more? Ask your health care provider. Call your local or state health department. Visit the website of the Food and Drug Administration (FDA) for vaccine package inserts and additional information at TraderRating.uy. Contact the Centers for Disease Control and Prevention (CDC): Call (715)434-5945 (1-800-CDC-INFO) or Visit CDC's website at https://gibson.com/. Vaccine Information Statement Inactivated Influenza Vaccine (12/24/2019) This information is not intended to replace advice given to you by your health care provider. Make sure you discuss any questions you have with your health care provider. Document Revised: 02/10/2020 Document Reviewed: 02/10/2020 Elsevier Patient Education  2022 [employer].

## 2021-01-24 NOTE — Assessment & Plan Note (Signed)
Possibly contributing to her neuropathy.  Starting B12 injections monthly.  Given first injection today.  She feels comfortable doing these at home as well.  We will plan to recheck levels in 6 months.

## 2021-01-25 ENCOUNTER — Telehealth: Payer: Self-pay | Admitting: Diagnostic Neuroimaging

## 2021-01-25 NOTE — Telephone Encounter (Signed)
[employer] pending uploaded notes on the portal  

## 2021-01-26 LAB — CBC WITH DIFFERENTIAL/PLATELET
Basophils Absolute: 0.1 10*3/uL (ref 0.0–0.2)
Basos: 1 %
EOS (ABSOLUTE): 0.1 10*3/uL (ref 0.0–0.4)
Eos: 2 %
Hematocrit: 44.9 % (ref 34.0–46.6)
Hemoglobin: 15 g/dL (ref 11.1–15.9)
Immature Grans (Abs): 0 10*3/uL (ref 0.0–0.1)
Immature Granulocytes: 0 %
Lymphocytes Absolute: 2.1 10*3/uL (ref 0.7–3.1)
Lymphs: 29 %
MCH: 32.6 pg (ref 26.6–33.0)
MCHC: 33.4 g/dL (ref 31.5–35.7)
MCV: 98 fL — ABNORMAL HIGH (ref 79–97)
Monocytes Absolute: 0.4 10*3/uL (ref 0.1–0.9)
Monocytes: 6 %
Neutrophils Absolute: 4.5 10*3/uL (ref 1.4–7.0)
Neutrophils: 62 %
Platelets: 303 10*3/uL (ref 150–450)
RBC: 4.6 x10E6/uL (ref 3.77–5.28)
RDW: 12.5 % (ref 11.7–15.4)
WBC: 7.2 10*3/uL (ref 3.4–10.8)

## 2021-01-26 LAB — COMPREHENSIVE METABOLIC PANEL
ALT: 16 IU/L (ref 0–32)
AST: 21 IU/L (ref 0–40)
Albumin/Globulin Ratio: 1.4 (ref 1.2–2.2)
Albumin: 3.9 g/dL (ref 3.8–4.8)
Alkaline Phosphatase: 183 IU/L — ABNORMAL HIGH (ref 44–121)
BUN/Creatinine Ratio: 9 (ref 9–23)
BUN: 8 mg/dL (ref 6–24)
Bilirubin Total: 0.5 mg/dL (ref 0.0–1.2)
CO2: 25 mmol/L (ref 20–29)
Calcium: 9.6 mg/dL (ref 8.7–10.2)
Chloride: 106 mmol/L (ref 96–106)
Creatinine, Ser: 0.94 mg/dL (ref 0.57–1.00)
Globulin, Total: 2.8 g/dL (ref 1.5–4.5)
Glucose: 86 mg/dL (ref 65–99)
Potassium: 4.5 mmol/L (ref 3.5–5.2)
Sodium: 143 mmol/L (ref 134–144)
Total Protein: 6.7 g/dL (ref 6.0–8.5)
eGFR: 76 mL/min/{1.73_m2} (ref >59)

## 2021-01-26 LAB — VITAMIN B12: Vitamin B-12: 348 pg/mL (ref 232–1245)

## 2021-01-26 LAB — MULTIPLE MYELOMA PANEL, SERUM
Albumin SerPl Elph-Mcnc: 3.3 g/dL (ref 2.9–4.4)
Albumin/Glob SerPl: 1 (ref 0.7–1.7)
Alpha 1: 0.3 g/dL (ref 0.0–0.4)
Alpha2 Glob SerPl Elph-Mcnc: 0.8 g/dL (ref 0.4–1.0)
B-Globulin SerPl Elph-Mcnc: 1.2 g/dL (ref 0.7–1.3)
Gamma Glob SerPl Elph-Mcnc: 1.2 g/dL (ref 0.4–1.8)
Globulin, Total: 3.4 g/dL (ref 2.2–3.9)
IgA/Immunoglobulin A, Serum: 253 mg/dL (ref 87–352)
IgG (Immunoglobin G), Serum: 1215 mg/dL (ref 586–1602)
IgM (Immunoglobulin M), Srm: 114 mg/dL (ref 26–217)

## 2021-01-26 LAB — SJOGREN'S SYNDROME ANTIBODS(SSA + SSB)
ENA SSA (RO) Ab: <0.2 AI (ref 0.0–0.9)
ENA SSB (LA) Ab: <0.2 AI (ref 0.0–0.9)

## 2021-01-26 LAB — METHYLMALONIC ACID, SERUM: Methylmalonic Acid: 302 nmol/L (ref 0–378)

## 2021-01-26 LAB — HOMOCYSTEINE: Homocysteine: 16.7 umol/L — ABNORMAL HIGH (ref 0.0–14.5)

## 2021-01-29 NOTE — Telephone Encounter (Signed)
Erin [id number] (exp. 01/26/21 to 07/25/21) order sent to GI bc [facility] is not in network for MRI's. GI will reach out to the patient to schedule.

## 2021-01-31 LAB — ANA W/REFLEX: ANA Titer 1: NEGATIVE

## 2021-02-09 ENCOUNTER — Telehealth: Payer: 59 | Admitting: Family

## 2021-02-09 ENCOUNTER — Telehealth: Payer: 59 | Admitting: Physician Assistant

## 2021-02-09 DIAGNOSIS — Z20822 Contact with and (suspected) exposure to covid-19: Secondary | ICD-10-CM

## 2021-02-09 DIAGNOSIS — U071 COVID-19: Secondary | ICD-10-CM

## 2021-02-09 MED ORDER — BENZONATATE 100 MG PO CAPS: 100.0000 mg | ORAL_CAPSULE | Freq: Three times a day (TID) | ORAL | 0 refills | Status: DC | PRN

## 2021-02-09 MED ORDER — FLUTICASONE PROPIONATE 50 MCG/ACT NA SUSP: 2.0000 | Freq: Every day | NASAL | 6 refills | Status: DC

## 2021-02-09 NOTE — Patient Instructions (Signed)
How to Protect Yourself and Others How to protect yourself & others Get vaccinated and stay up to date on your COVID-19 vaccines COVID-19 vaccines are effective at preventing you from getting sick. COVID-19 vaccines are highly effective at preventing severe illness, hospitalizations, and death. Getting vaccinated is the best way to slow the spread of SARS-CoV-2, the virus that causes COVID-19. CDC recommends that everyone who is eligible stay up to date on their COVID-19 vaccines, including people with weakened immune systems. Wear a mask Everyone ages 2 years and older should properly wear a well-fitting mask indoors in public in areas where the COVID-19 Community Level is high, regardless of vaccination status. Wear a mask with the best fit, protection, and comfort for you. If you are in an area with a high COVID-19 Community Level and are ages 46 or older, wear a mask indoors in public. If you are sick and need to be around others, or are caring for someone who has COVID-19, wear a mask. If you are at increased risk for severe illness, or live with or spend time with someone at higher risk, speak to your healthcare provider about wearing a mask at medium COVID-19 [place]. People who have a condition or are taking medications that weaken their immune system may not be fully protected even if they are up to date on their COVID-19 vaccines. They should talk to their healthcare providers about what additional precautions may be necessary. For more information, see COVID-19 Vaccines for Moderately or Severely Immunocompromised People Everyone aged 2 years or older--including passengers and workers-- should properly wear a well-fitting mask or respirator in indoor areas of public transportation (such as airplanes, trains, buses, ferries) and transportation hubs (such as airports, stations, and seaports), especially in locations that are crowded or poorly ventilated such as airport Therapist, occupational. Stay 6  feet away from others Inside your home: Avoid close contact with people who are sick, if possible. If possible, maintain 6 feet between the person who is sick and other household members. If you are taking care of someone who is sick, make sure you properly wear a well-fitting mask and follow other steps to protect yourself. Indoors in public: If you are not up to date on COVID-19 vaccines, stay at least 6 feet away from other people, especially if you are at higher risk of getting very sick with COVID-19. Avoid poorly ventilated spaces and crowds If indoors, bring in fresh air by opening windows and doors, if possible. If you are at increased risk of getting very sick from COVID-19, avoid crowded places and indoor spaces that do not have fresh air from the outdoors. Test to prevent spread to others You can choose from many different types of tests. Tests for SARS-CoV-2(the virus that causes COVID-19) tell you if you have an infection at the time of the test. This type of test is called a viral test because it looks for viral infection. Regardless of the test type you select, a positive test result means that you have an infection and should isolate and inform your close contacts to avoid spreading disease to others. Over-the-counter self-tests are viral tests that can be used at home or anywhere, are easy to use, and produce rapid results. Anyone can use self-tests, regardless of their vaccination status or whether they have symptoms. COVID-19 self-tests are one of many risk-reduction measures, along with vaccination, masking, and physical distancing, that protect you and others by reducing the chances of spreading COVID-19. Wash your hands often  Wash your hands often with soap and water for at least 20 seconds especially after you have been in a public place, or after blowing your nose, coughing, or sneezing. It's especially important to wash your hands: Before eating or preparing food Before  touching your face After using the restroom After leaving a public place After blowing your nose, coughing, or sneezing After handling your mask After changing a diaper After caring for someone sick After touching animals or pets If soap and water are not readily available, use a hand sanitizer that contains at least 60% alcohol. Cover all surfaces of your hands and rub them together until they feel dry. Avoid touching your eyes, nose, and mouth with unwashed hands. Cover coughs and sneezes If you are wearing a mask: You can cough or sneeze into your mask. Put on a new, clean mask as soon as possible and wash your hands. If you are not wearing a mask: Always cover your mouth and nose with a tissue when you cough or sneeze, or use the inside of your elbow and do not spit. Throw used tissues in the trash. Immediately wash your hands with soap and water for at least 20 seconds. If soap and water are not readily available, clean your hands with a hand sanitizer that contains at least 60% alcohol. Clean and disinfect Clean high touch surfaces regularly or as needed and after you have visitors in your home. This includes tables, doorknobs, light switches, countertops, handles, desks, phones, keyboards, toilets, faucets, and sinks. If someone is sick or has tested positive for COVID-19, disinfect frequently touched surfaces. Use a household disinfectant product from [employer]: Disinfectants for Coronavirus (COVID-19) according to manufacturer's labeled directions. If surfaces are dirty, clean them using detergent or soap and water prior to disinfection. Monitor your health daily Be alert for symptoms: Watch for fever, cough, shortness of breath, or other symptomsof COVID-19. Take your temperature if symptoms develop. Don't take your temperature within 30 minutes of exercising or after taking medications that could lower your temperature, like acetaminophen. Follow CDC guidance if symptoms  develop. Monitoring symptoms is especially important if you are running errands, going into the office or workplace, and in settings where it may be difficult to keep a physical distance of 6 feet. Follow recommendations for quarantine If you come into close contact with someone with COVID-19: follow CDC's recommendations for quarantine. Follow recommendations for isolation If you test positive for COVID-19 or have symptoms: follow CDC's recommendations for isolation. Take precautions when you travel Follow CDC's recommendations for domestic and international travel. Additional resources For more information, see: Families with vaccinated and unvaccinated members Improve How Your Mask Protects You Information for specific groups of people (link: COVID-19 Information for Specific Groups of People  CDC) Related pages Prevent Getting Sick Symptoms How COVID-19 Spreads If You Are Sick or Caring for Someone People at Increased Risk Frequently Asked Questions Hand Sanitizer Use Quarantine and Isolation 07/14/2020 Content source: [facility] for Immunization and Respiratory Diseases ([facility]), Division of Viral Diseases This information is not intended to replace advice given to you by your health care provider. Make sure you discuss any questions you have with your health care provider. Document Revised: 09/21/2020 Document Reviewed: 09/21/2020 Elsevier Patient Education  [place]: [place] [employer] and Audiological scientist If you were exposed Quarantine and stay away from others when you have been in close contact with someone who has COVID-19. Isolate If you are sick or test positive  Isolate when you are sick or when you have COVID-19, even if you don't have symptoms. When to stay home Calculating quarantine The date of your exposure is considered day 0. Day 1 is the first full day after your last contact with a person who has had COVID-19. Stay home and away from  other people for at least 5 days. Learn why CDC updated guidance for the general public. IF YOU were exposed to COVID-19 and are NOT  up to dateIF YOU were exposed to COVID-19 and are NOT on COVID-19 vaccinations Quarantine for at least 5 days Stay home Stay home and quarantine for at least 5 full days. Wear a well-fitting mask if you must be around others in your home. Do not travel. Get tested Even if you don't develop symptoms, get tested at least 5 days after you last had close contact with someone with COVID-19. After quarantine Watch for symptoms Watch for symptoms until 10 days after you last had close contact with someone with COVID-19. Avoid travel It is best to avoid travel until a full 10 days after you last had close contact with someone with COVID-19. If you develop symptoms Isolate immediately and get tested. Continue to stay home until you know the results. Wear a well-fitting mask around others. Take precautions until day 10 Wear a well-fitting mask Wear a well-fitting mask for 10 full days any time you are around others inside your home or in public. Do not go to places where you are unable to wear a well-fitting mask. If you must travel during days 6-10, take precautions. Avoid being around people who are more likely to get very sick from COVID-19. IF YOU were exposed to COVID-19 and are  up to dateIF YOU were exposed to COVID-19 and are on COVID-19 vaccinations No quarantine You do not need to stay home unless you develop symptoms. Get tested Even if you don't develop symptoms, get tested at least 5 days after you last had close contact with someone with COVID-19. Watch for symptoms Watch for symptoms until 10 days after you last had close contact with someone with COVID-19. If you develop symptoms Isolate immediately and get tested. Continue to stay home until you know the results. Wear a well-fitting mask around others. Take precautions until day 10 Wear a  well-fitting mask Wear a well-fitting mask for 10 full days any time you are around others inside your home or in public. Do not go to places where you are unable to wear a well-fitting mask. Take precautions if traveling Avoid being around people who are more likely to get very sick from COVID-19. IF YOU were exposed to COVID-19 and had confirmed COVID-19 within the past 90 days (you tested positive using a viral test) No quarantine You do not need to stay home unless you develop symptoms. Watch for symptoms Watch for symptoms until 10 days after you last had close contact with someone with COVID-19. If you develop symptoms Isolate immediately and get tested. Continue to stay home until you know the results. Wear a well-fitting mask around others. Take precautions until day 10 Wear a well-fitting mask Wear a well-fitting mask for 10 full days any time you are around others inside your home or in public. Do not go to places where you are unable to wear a well-fitting mask. Take precautions if traveling Avoid being around people who are more likely to get very sick from COVID-19. Calculating isolation Day 0 is your first day of symptoms  or a positive viral test. Day 1 is the first full day after your symptoms developed or your test specimen was collected. If you have COVID-19 or have symptoms, isolate for at least 5 days. IF YOU tested positive for COVID-19 or have symptoms, regardless of vaccination status Stay home for at least 5 days Stay home for 5 days and isolate from others in your home. Wear a well-fitting mask if you must be around others in your home. Do not travel. Ending isolation if you had symptoms End isolation after 5 full days if you are fever-free for 24 hours (without the use of fever-reducing medication) and your symptoms are improving. Ending isolation if you did NOT have symptoms End isolation after at least 5 full days after your positive test. If you got very sick  from COVID-19 or have a weakened immune system You should isolate for at least 10 days. Consult your doctor before ending isolation. Take precautions until day 10 Wear a well-fitting mask Wear a well-fitting mask for 10 full days any time you are around others inside your home or in public. Do not go to places where you are unable to wear a well-fitting mask. Do not travel Do not travel until a full 10 days after your symptoms started or the date your positive test was taken if you had no symptoms. Avoid being around people who are more likely to get very sick from COVID-19. Definitions Exposure Contact with someone infected with SARS-CoV-2, the virus that causes COVID-19, in a way that increases the likelihood of getting infected with the virus. Close contact A close contact is someone who was less than 6 feet away from an infected person (laboratory-confirmed or a clinical diagnosis) for a cumulative total of 15 minutes or more over a 24-hour period. For example, three individual 5-minute exposures for a total of 15 minutes. People who are exposed to someone with COVID-19 after they completed at least 5 days of isolation are not considered close contacts. Julio Sicks is a strategy used to prevent transmission of COVID-19 by keeping people who have been in close contact with someone with COVID-19 apart from others. Who does not need to quarantine? If you had close contact with someone with COVID-19 and you are in one of the following groups, you do not need to quarantine. You are up to date with your COVID-19 vaccines. You had confirmed COVID-19 within the last 90 days (meaning you tested positive using a viral test). If you are up to date with COVID-19 vaccines, you should wear a well-fitting mask around others for 10 days from the date of your last close contact with someone with COVID-19 (the date of last close contact is considered day 0). Get tested at least 5 days after you last  had close contact with someone with COVID-19. If you test positive or develop COVID-19 symptoms, isolate from other people and follow recommendations in the Isolation section below. If you tested positive for COVID-19 with a viral test within the previous 90 days and subsequently recovered and remain without COVID-19 symptoms, you do not need to quarantine or get tested after close contact. You should wear a well-fitting mask around others for 10 days from the date of your last close contact with someone with COVID-19 (the date of last close contact is considered day 0). If you have COVID-19 symptoms, get tested and isolate from other people and follow recommendations in the Isolation section below. Who should quarantine? If you come into close  contact with someone with COVID-19, you should quarantine if you are not up to date on COVID-19 vaccines. This includes people who are not vaccinated. What to do for quarantine Stay home and away from other people for at least 5 days (day 0 through day 5) after your last contact with a person who has COVID-19. The date of your exposure is considered day 0. Wear a well-fitting mask when around others at home, if possible. For 10 days after your last close contact with someone with COVID-19, watch for fever (100.60F or greater), cough, shortness of breath, or other COVID-19 symptoms. If you develop symptoms, get tested immediately and isolate until you receive your test results. If you test positive, follow isolation recommendations. If you do not develop symptoms, get tested at least 5 days after you last had close contact with someone with COVID-19. If you test negative, you can leave your home, but continue to wear a well-fitting mask when around others at home and in public until 10 days after your last close contact with someone with COVID-19. If you test positive, you should isolate for at least 5 days from the date of your positive test (if you do not have  symptoms). If you do develop COVID-19 symptoms, isolate for at least 5 days from the date your symptoms began (the date the symptoms started is day 0). Follow recommendations in the isolation section below. If you are unable to get a test 5 days after last close contact with someone with COVID-19, you can leave your home after day 5 if you have been without COVID-19 symptoms throughout the 5-day period. Wear a well-fitting mask for 10 days after your date of last close contact when around others at home and in public. Avoid people who are have weakened immune systems or are more likely to get very sick from COVID-19, and nursing homes and other high-risk settings, until after at least 10 days. If possible, stay away from people you live with, especially people who are at higher risk for getting very sick from COVID-19, as well as others outside your home throughout the full 10 days after your last close contact with someone with COVID-19. If you are unable to quarantine, you should wear a well-fitting mask for 10 days when around others at home and in public. If you are unable to wear a mask when around others, you should continue to quarantine for 10 days. Avoid people who have weakened immune systems or are more likely to get very sick from COVID-19, and nursing homes and other high-risk settings, until after at least 10 days. See additional information about travel. Do not go to places where you are unable to wear a mask, such as restaurants and some gyms, and avoid eating around others at home and at work until after 10 days after your last close contact with someone with COVID-19. After quarantine Watch for symptoms until 10 days after your last close contact with someone with COVID-19. If you have symptoms, isolate immediately and get tested. Quarantine in high-risk congregate settings In certain congregate settings that have high risk of secondary transmission (such as Systems analyst and detention  facilities, homeless shelters, or cruise ships), CDC recommends a 10-day quarantine for residents, regardless of vaccination and booster status. During periods of critical staffing shortages, facilities may consider shortening the quarantine period for staff to ensure continuity of operations. Decisions to shorten quarantine in these settings should be made in consultation with state, local, tribal, or territorial health  departments and should take into consideration the context and characteristics of the facility. CDC's setting-specific guidance provides additional recommendations for these settings. Isolation Isolation is used to separate people with confirmed or suspected COVID-19 from those without COVID-19. People who are in isolation should stay home until it's safe for them to be around others. At home, anyone sick or infected should separate from others, or wear a well-fitting mask when they need to be around others. People in isolation should stay in a specific "sick room" or area and use a separate bathroom if available. Everyone who has presumed or confirmed COVID-19 should stay home and isolate from other people for at least 5 full days (day 0 is the first day of symptoms or the date of the day of the positive viral test for asymptomatic persons). They should wear a mask when around others at home and in public for an additional 5 days. People who are confirmed to have COVID-19 or are showing symptoms of COVID-19 need to isolate regardless of their vaccination status. This includes: People who have a positive viral test for COVID-19, regardless of whether or not they have symptoms. People with symptoms of COVID-19, including people who are awaiting test results or have not been tested. People with symptoms should isolate even if they do not know if they have been in close contact with someone with COVID-19. What to do for isolation Monitor your symptoms. If you have an emergency warning sign  (including trouble breathing), seek emergency medical care immediately. Stay in a separate room from other household members, if possible. Use a separate bathroom, if possible. Take steps to improve ventilation at home, if possible. Avoid contact with other members of the household and pets. Don't share personal household items, like cups, towels, and utensils. Wear a well-fitting mask when you need to be around other people. Learn more about what to do if you are sick and how to notify your contacts. Ending isolation for people who had COVID-19 and had symptoms If you had COVID-19 and had symptoms, isolate for at least 5 days. To calculate your 5-day isolation period, day 0 is your first day of symptoms. Day 1 is the first full day after your symptoms developed. You can leave isolation after 5 full days. You can end isolation after 5 full days if you are fever-free for 24 hours without the use of fever-reducing medication and your other symptoms have improved (Loss of taste and smell may persist for weeks or months after recovery and need not delay the end of isolation). You should continue to wear a well-fitting mask around others at home and in public for 5 additional days (day 6 through day 10) after the end of your 5-day isolation period. If you are unable to wear a mask when around others, you should continue to isolate for a full 10 days. Avoid people who have weakened immune systems or are more likely to get very sick from COVID-19, and nursing homes and other high-risk settings, until after at least 10 days. If you continue to have fever or your other symptoms have not improved after 5 days of isolation, you should wait to end your isolation until you are fever-free for 24 hours without the use of fever-reducing medication and your other symptoms have improved. Continue to wear a well-fitting mask through day 10. Contact your healthcare provider if you have questions. See additional information  about travel. Do not go to places where you are unable to wear a  mask, such as restaurants and some gyms, and avoid eating around others at home and at work until a full 10 days after your first day of symptoms. If an individual has access to a test and wants to test, the best approach is to use an antigen test1 towards the end of the 5-day isolation period. Collect the test sample only if you are fever-free for 24 hours without the use of fever-reducing medication and your other symptoms have improved (loss of taste and smell may persist for weeks or months after recovery and need not delay the end of isolation). If your test result is positive, you should continue to isolate until day 10. If your test result is negative, you can end isolation, but continue to wear a well-fitting mask around others at home and in public until day 10. Follow additional recommendations for masking and avoiding travel as described above. 1As noted in the labeling for authorized over-the counter antigen tests: Negative results should be treated as presumptive. Negative results do not rule out SARS-CoV-2 infection and should not be used as the sole basis for treatment or patient management decisions, including infection control decisions. To improve results, antigen tests should be used twice over a three-day period with at least 24 hours and no more than 48 hours between tests. Note that these recommendations on ending isolation do not apply to people who are moderately ill or very sick from COVID-19 or have weakened immune systems. See section below for recommendations for when to end isolation for these groups. Ending isolation for people who tested positive for COVID-19 but had no symptoms If you test positive for COVID-19 and never develop symptoms, isolate for at least 5 days. Day 0 is the day of your positive viral test (based on the date you were tested) and day 1 is the first full day after the specimen was collected for  your positive test. You can leave isolation after 5 full days. If you continue to have no symptoms, you can end isolation after at least 5 days. You should continue to wear a well-fitting mask around others at home and in public until day 10 (day 6 through day 10). If you are unable to wear a mask when around others, you should continue to isolate for 10 days. Avoid people who have weakened immune systems or are more likely to get very sick from COVID-19, and nursing homes and other high-risk settings, until after at least 10 days. If you develop symptoms after testing positive, your 5-day isolation period should start over. Day 0 is your first day of symptoms. Follow the recommendations above for ending isolation for people who had COVID-19 and had symptoms. See additional information about travel. Do not go to places where you are unable to wear a mask, such as restaurants and some gyms, and avoid eating around others at home and at work until 10 days after the day of your positive test. If an individual has access to a test and wants to test, the best approach is to use an antigen test1 towards the end of the 5-day isolation period. If your test result is positive, you should continue to isolate until day 10. If your test result is positive, you can also choose to test daily and if your test result is negative, you can end isolation, but continue to wear a well-fitting mask around others at home and in public until day 10. Follow additional recommendations for masking and avoiding travel as described above.  1As noted in the labeling for authorized over-the counter antigen tests: Negative results should be treated as presumptive. Negative results do not rule out SARS-CoV-2 infection and should not be used as the sole basis for treatment or patient management decisions, including infection control decisions. To improve results, antigen tests should be used twice over a three-day period with at least 24 hours  and no more than 48 hours between tests. Ending isolation for people who were moderately or very sick from COVID-19 or have a weakened immune system People who are moderately ill from COVID-19 (experiencing symptoms that affect the lungs like shortness of breath or difficulty breathing) should isolate for 10 days and follow all other isolation precautions. To calculate your 10-day isolation period, day 0 is your first day of symptoms. Day 1 is the first full day after your symptoms developed. If you are unsure if your symptoms are moderate, talk to a healthcare provider for further guidance. People who are very sick from COVID-19 (this means people who were hospitalized or required intensive care or ventilation support) and people who have weakened immune systems might need to isolate at home longer. They may also require testing with a viral test to determine when they can be around others. CDC recommends an isolation period of at least 10 and up to 20 days for people who were very sick from COVID-19 and for people with weakened immune systems. Consult with your healthcare provider about when you can resume being around other people. If you are unsure if your symptoms are severe or if you have a weakened immune system, talk to a healthcare provider for further guidance. People who have a weakened immune system should talk to their healthcare provider about the potential for reduced immune responses to COVID-19 vaccines and the need to continue to follow current prevention measures (including wearing a well-fitting mask and avoiding crowds and poorly ventilated indoor spaces) to protect themselves against COVID-19 until advised otherwise by their healthcare provider. Close contacts of immunocompromised people--including household members--should also be encouraged to receive all recommended COVID-19 vaccine doses to help protect these people. Isolation in high-risk congregate settings In certain high-risk  congregate settings that have high risk of secondary transmission and where it is not feasible to cohort people (such as Systems analyst and detention facilities, homeless shelters, and cruise ships), CDC recommends a 10-day isolation period for residents. During periods of critical staffing shortages, facilities may consider shortening the isolation period for staff to ensure continuity of operations. Decisions to shorten isolation in these settings should be made in consultation with state, local, tribal, or territorial health departments and should take into consideration the context and characteristics of the facility. CDC's setting-specific guidance provides additional recommendations for these settings. This CDC guidance is meant to supplement--not replace--any federal, state, local, territorial, or tribal health and safety laws, rules, and regulations. Recommendations for specific settings These recommendations do not apply to healthcare professionals. For guidance specific to these settings, see Healthcare professionals: Interim Guidance for Optician, dispensing with SARS-CoV-2 Infection or Exposure to SARS-CoV-2 Patients, residents, and visitors to healthcare settings: Interim Infection Prevention and Control Recommendations for Healthcare Personnel During the [place] 2019 (COVID-19) Pandemic Additional setting-specific guidance and recommendations are available. These recommendations on quarantine and isolation do apply to [place] settings. Additional guidance is available here: Overview of COVID-19 Quarantine for [employer] Travelers: Travel information and recommendations Congregate facilities and other settings: [employer] for community, work, and school settings Ongoing COVID-19 exposure FAQs  I live with someone with COVID-19, but I cannot be separated from them. How do we manage quarantine in this situation? It is very important for people with COVID-19 to  remain apart from other people, if possible, even if they are living together. If separation of the person with COVID-19 from others that they live with is not possible, the other people that they live with will have ongoing exposure, meaning they will be repeatedly exposed until that person is no longer able to spread the virus to other people. In this situation, there are precautions you can take to limit the spread of COVID-19: The person with COVID-19 and everyone they live with should wear a well-fitting mask inside the home. If possible, one person should care for the person with COVID-19 to limit the number of people who are in close contact with the infected person. Take steps to protect yourself and others to reduce transmission in the home: Quarantine if you are not up to date with your COVID-19 vaccines. Isolate if you are sick or tested positive for COVID-19, even if you don't have symptoms. Learn more about the public health recommendations for testing, mask use and quarantine of close contacts, like yourself, who have ongoing exposure. These recommendations differ depending on your vaccination status. What should I do if I have ongoing exposure to COVID-19 from someone I live with? Recommendations for this situation depend on your vaccination status: If you are not up to date on COVID-19 vaccines and have ongoing exposure to COVID-19, you should: Begin quarantine immediately and continue to quarantine throughout the isolation period of the person with COVID-19. Continue to quarantine for an additional 5 days starting the day after the end of isolation for the person with COVID-19. Get tested at least 5 days after the end of isolation of the infected person that lives with them. If you test negative, you can leave the home but should continue to wear a well-fitting mask when around others at home and in public until 10 days after the end of isolation for the person with COVID-19. Isolate  immediately if you develop symptoms of COVID-19 or test positive. If you are up to date with COVID-19 vaccines and have ongoing exposure to COVID-19, you should: Get tested at least 5 days after your first exposure. A person with COVID-19 is considered infectious starting 2 days before they develop symptoms, or 2 days before the date of their positive test if they do not have symptoms. Get tested again at least 5 days after the end of isolation for the person with COVID-19. Wear a well-fitting mask when you are around the person with COVID-19, and do this throughout their isolation period. Wear a well-fitting mask around others for 10 days after the infected person's isolation period ends. Isolate immediately if you develop symptoms of COVID-19 or test positive. What should I do if multiple people I live with test positive for COVID-19 at different times? Recommendations for this situation depend on your vaccination status: If you are not up to date with your COVID-19 vaccines, you should: Quarantine throughout the isolation period of any infected person that you live with. Continue to quarantine until 5 days after the end of isolation date for the most recently infected person that lives with you. For example, if the last day of isolation of the person most recently infected with COVID-19 was June 30, the new 5-day quarantine period starts on July 1. Get tested at least 5 days after the end  of isolation for the most recently infected person that lives with you. Wear a well-fitting mask when you are around any person with COVID-19 while that person is in isolation. Wear a well-fitting mask when you are around other people until 10 days after your last close contact. Isolate immediately if you develop symptoms of COVID-19 or test positive. If you are up to date with your COVID-19 vaccines, you should: Get tested at least 5 days after your first exposure. A person with COVID-19 is considered  infectious starting 2 days before they developed symptoms, or 2 days before the date of their positive test if they do not have symptoms. Get tested again at least 5 days after the end of isolation for the most recently infected person that lives with you. Wear a well-fitting mask when you are around any person with COVID-19 while that person is in isolation. Wear a well-fitting mask around others for 10 days after the end of isolation for the most recently infected person that lives with you. For example, if the last day of isolation for the person most recently infected with COVID-19 was June 30, the new 10-day period to wear a well-fitting mask indoors in public starts on July 1. Isolate immediately if you develop symptoms of COVID-19 or test positive. I had COVID-19 and completed isolation. Do I have to quarantine or get tested if someone I live with gets COVID-19 shortly after I completed isolation? No. If you recently completed isolation and someone that lives with you tests positive for the virus that causes COVID-19 shortly after the end of your isolation period, you do not have to quarantine or get tested as long as you do not develop new symptoms. Once all of the people that live together have completed isolation or quarantine, refer to the guidance below for new exposures to COVID-19. If you had COVID-19 in the previous 90 days and then came into close contact with someone with COVID-19, you do not have to quarantine or get tested if you do not have symptoms. But you should: Wear a well-fitting mask indoors in public for 10 days after your last close contact. Monitor for COVID-19 symptoms for 10 days from the date of your last close contact. Isolate immediately and get tested if symptoms develop. If more than 90 days have passed since your recovery from infection, follow CDC's recommendations for close contacts. These recommendations will differ depending on your vaccination  status. 08/16/2020 Content source: [facility] ([facility]), Division of Viral Diseases This information is not intended to replace advice given to you by your health care provider. Make sure you discuss any questions you have with your health care provider. Document Revised: 09/21/2020 Document Reviewed: 09/21/2020 Elsevier Patient Education  [place].

## 2021-02-09 NOTE — Progress Notes (Signed)
  E-Visit  for Positive Covid Test Result  We are sorry you are not feeling well. We are here to help!  You have tested positive for COVID-19, meaning that you were infected with the novel coronavirus and could give the virus to others.  It is vitally important that you stay home so you do not spread it to others.      Please continue isolation at home, for at least 10 days since the start of your symptoms and until you have had 24 hours with no fever (without taking a fever reducer) and with improving of symptoms.  If you have no symptoms but tested positive (or all symptoms resolve after 5 days and you have no fever) you can leave your house but continue to wear a mask around others for an additional 5 days. If you have a fever,continue to stay home until you have had 24 hours of no fever. Most cases improve 5-10 days from onset but we have seen a small number of patients who have gotten worse after the 10 days.  Please be sure to watch for worsening symptoms and remain taking the proper precautions.   Go to the nearest hospital ED for assessment if fever/cough/breathlessness are severe or illness seems like a threat to life.    The following symptoms may appear 2-14 days after exposure: Fever Cough Shortness of breath or difficulty breathing Chills Repeated shaking with chills Muscle pain Headache Sore throat New loss of taste or smell Fatigue Congestion or runny nose Nausea or vomiting Diarrhea  You have been enrolled in MyChart Home Monitoring for COVID-19. Daily you will receive a questionnaire within the MyChart website. Our COVID-19 response team will be monitoring your responses daily.  You can use medication such as prescription cough medication called Tessalon Perles 100 mg. You may take 1-2 capsules every 8 hours as needed for cough and prescription for Fluticasone nasal spray 2 sprays in each nostril one time per day  You may also take acetaminophen (Tylenol) as needed  for fever.  HOME CARE: Only take medications as instructed by your medical team. Drink plenty of fluids and get plenty of rest. A steam or ultrasonic humidifier can help if you have congestion.   GET HELP RIGHT AWAY IF YOU HAVE EMERGENCY WARNING SIGNS.  Call 911 or proceed to your closest emergency facility if: You develop worsening high fever. Trouble breathing Bluish lips or face Persistent pain or pressure in the chest New confusion Inability to wake or stay awake You cough up blood. Your symptoms become more severe Inability to hold down food or fluids  This list is not all possible symptoms. Contact your medical provider for any symptoms that are severe or concerning to you.    Your e-visit answers were reviewed by a board certified advanced clinical practitioner to complete your personal care plan.  Depending on the condition, your plan could have included both over the counter or prescription medications.  If there is a problem please reply once you have received a response from your provider.  Your safety is important to us.  If you have drug allergies check your prescription carefully.    You can use MyChart to ask questions about today's visit, request a non-urgent call back, or ask for a work or school excuse for 24 hours related to this e-Visit. If it has been greater than 24 hours you will need to follow up with your provider, or enter a new e-Visit to address those   concerns. You will get an e-mail in the next two days asking about your experience.  I hope that your e-visit has been valuable and will speed your recovery. Thank you for using e-visits.   Approximately 5 minutes was spent documenting and reviewing patient's chart.     

## 2021-02-09 NOTE — Progress Notes (Signed)
Virtual Visit Consent   [name], you are scheduled for a virtual visit with a [facility] provider today.     Just as with appointments in the office, your consent must be obtained to participate.  Your consent will be active for this visit and any virtual visit you may have with one of our providers in the next 365 days.     If you have a MyChart account, a copy of this consent can be sent to you electronically.  All virtual visits are billed to your insurance company just like a traditional visit in the office.    As this is a virtual visit, video technology does not allow for your provider to perform a traditional examination.  This may limit your provider's ability to fully assess your condition.  If your provider identifies any concerns that need to be evaluated in person or the need to arrange testing (such as labs, EKG, etc.), we will make arrangements to do so.     Although advances in technology are sophisticated, we cannot ensure that it will always work on either your end or our end.  If the connection with a video visit is poor, the visit may have to be switched to a telephone visit.  With either a video or telephone visit, we are not always able to ensure that we have a secure connection.     I need to obtain your verbal consent now.   Are you willing to proceed with your visit today?    [name] has provided verbal consent on 02/09/2021 for a virtual visit (video or telephone).   Mar Daring, PA-C   Date: 02/09/2021 11:37 AM   Virtual Visit via Video Note   I, Mar Daring, connected with  [name]  ([id number], 1975-11-22) on 02/09/21 at 11:00 AM EDT by a video-enabled telemedicine application and verified that I am speaking with the correct person using two identifiers.  Location: Patient: Virtual Visit Location Patient: Home Provider: Virtual Visit Location Provider: Home Office   I discussed the limitations of evaluation and  management by telemedicine and the availability of in person appointments. The patient expressed understanding and agreed to proceed.    History of Present Illness: [name] is a 45 y.o. who identifies as a female who was assigned female at birth, and is being seen today for exposure to covid 23. She reports her boyfriend tested positive today. His symptoms started Wednesday. She reports she is not having any symptoms. She admits to having a runny nose and a mild headache, but reports she has fall seasonal allergies and migraines and this is not uncommon for her and does not feel any different than her normal chronic issues. She is questioning what she should do. She is vaccinated and boosted.   Problems:  Patient Active Problem List   Diagnosis Date Noted   B12 deficiency 01/24/2021   Corneal abrasion 11/25/2020   Elevated MCV 11/25/2020   Visual changes 11/25/2020   Migraine 11/15/2020   Elevated liver enzymes 11/02/2020   Peripheral neuropathy 10/11/2020   Irritable bowel syndrome with diarrhea 02/02/2018   Generalized anxiety disorder 02/02/2018   Perirectal abscess 10/28/2016   Chronic fatigue 01/09/2016   Adrenal nodule ([place]) 01/09/2016   Chronic low back pain 11/21/2015   Benign essential hypertension 08/13/2015   Elevated lipase 12/14/2014   AP (abdominal pain) 12/14/2014   Nausea and vomiting 11/29/2014   Intractable abdominal pain 11/28/2014  Hypokalemia 11/28/2014   HTN (hypertension) 11/28/2014   Pilonidal abscess 12/03/2013   Patellar instability of left knee 06/08/2013    Allergies:  Allergies  Allergen Reactions   Penicillins Anaphylaxis, Swelling and Rash    Has patient had a PCN reaction causing immediate rash, facial/tongue/throat swelling, SOB or lightheadedness with hypotension: Yes Has patient had a PCN reaction causing severe rash involving mucus membranes or skin necrosis: No Has patient had a PCN reaction that required hospitalization No Has  patient had a PCN reaction occurring within the last 10 years: No If all of the above answers are "NO", then may proceed with Cephalosporin use.  Face and throat swell   Medications:  Current Outpatient Medications:    ALPRAZolam (XANAX) 0.5 MG tablet, for sedation before MRI scan; take 1 tab 1 hour before scan; may repeat 1 tab 15 min before scan, Disp: 3 tablet, Rfl: 0   amitriptyline (ELAVIL) 50 MG tablet, Take 1 tablet (50 mg total) by mouth at bedtime., Disp: 90 tablet, Rfl: 4   benzonatate (TESSALON PERLES) 100 MG capsule, Take 1 capsule (100 mg total) by mouth 3 (three) times daily as needed., Disp: 20 capsule, Rfl: 0   cyanocobalamin (,VITAMIN B-12,) 1000 MCG/ML injection, Inject 1 mL (1,000 mcg total) into the muscle every 30 (thirty) days., Disp: 3 mL, Rfl: 3   dicyclomine (BENTYL) 10 MG capsule, Take 1 capsule (10 mg total) by mouth 4 (four) times daily -  before meals and at bedtime. (Patient taking differently: Take 10 mg by mouth in the morning and at bedtime.), Disp: 90 capsule, Rfl: 1   fluticasone (FLONASE) 50 MCG/ACT nasal spray, Place 2 sprays into both nostrils daily., Disp: 16 g, Rfl: 6   gabapentin (NEURONTIN) 300 MG capsule, Take 1-3 capsules (300-900 mg total) by mouth 3 (three) times daily. Take 300mg  qhs x1 week then may increase to 300mg  TID, Disp: 270 capsule, Rfl: 6   olmesartan-hydrochlorothiazide (BENICAR HCT) 40-12.5 MG tablet, Take 1 tablet by mouth daily., Disp: 90 tablet, Rfl: 2   Rimegepant Sulfate (NURTEC) 75 MG TBDP, Take 75 mg by mouth daily as needed (migraine)., Disp: 15 tablet, Rfl: 3   Syringe/Needle, Disp, (SYRINGE 3CC/25GX1-1/2") 25G X 1-1/2" 3 ML MISC, Use to inject B12, Disp: 25 each, Rfl: 0   vitamin B-12 (CYANOCOBALAMIN) 1000 MCG tablet, Take 1,000 mcg by mouth daily., Disp: , Rfl:    Vitamin D, Ergocalciferol, (DRISDOL) 1.25 MG (50000 UNIT) CAPS capsule, Take 1 capsule (50,000 Units total) by mouth every 7 (seven) days., Disp: 12 capsule, Rfl:  0  Observations/Objective: Patient is well-developed, well-nourished in no acute distress.  Resting comfortably at home.  Head is normocephalic, atraumatic.  No labored breathing.  Speech is clear and coherent with logical content.  Patient is alert and oriented at baseline.    Assessment and Plan: 1. Exposure to COVID-19 virus  - Answered all questions - Discussed current symptoms most commonly seen with Covid - Discussed current CDC guidelines for covid exposure  - Advised to test in 3-4 days (Sunday) if she stays asymptomatic. However she can test at any time if she develops symptoms - Advised if she test positive she can reach back out and do another video visit for antiviral treatment  Follow Up Instructions: I discussed the assessment and treatment plan with the patient. The patient was provided an opportunity to ask questions and all were answered. The patient agreed with the plan and demonstrated an understanding of the instructions.  A copy  of instructions were sent to the patient via MyChart unless otherwise noted below.   The patient was advised to call back or seek an in-person evaluation if the symptoms worsen or if the condition fails to improve as anticipated.  Time:  I spent 17 minutes with the patient via telehealth technology discussing the above problems/concerns.    Mar Daring, PA-C

## 2021-02-11 ENCOUNTER — Telehealth: Payer: Self-pay | Admitting: *Deleted

## 2021-02-11 NOTE — Telephone Encounter (Signed)
Called patient to review symptoms reported on My Chart covid questionnaire. Patient reports she has not been tested for covid but her boyfriend has and positive and now she is having worsening symptoms today. Recommended testing for covid. Patient reports SOB with exertion, nonproductive cough, weakness, decreased appetite, diarrhea. Patient reports she does not have a thermometer and has not checked her temperature. Reviewed symptoms and care advise with patient and when to notify PCP and or seek treatment in ED or call 911. My Chart message will be sent to patient . Patient verbalized understanding of care advise.

## 2021-02-12 ENCOUNTER — Telehealth: Payer: Self-pay

## 2021-02-12 NOTE — Telephone Encounter (Signed)
BPA triggered for worsening symptoms new diarrhea, weakness and diarrhea. She reports abdomen hurting, nausea when eating ice, no vomiting, unable to get OOB today. Go to the ED for worsening symptoms, call PCP to notify of symptoms.   2: FLUID THERAPY DURING MILD TO MODERATE DIARRHEA:  * Drink more fluids, at least 8 to 10 cups daily. One cup equals 8 oz (240 ml).  * WATER: For mild to moderate diarrhea, water is often the best liquid to drink. You should also eat some salty foods (e.g., potato chips, pretzels, saltine crackers). This is important to make sure you are getting enough salt, sugars, and fluids to meet your body's needs.  * SPORTS DRINKS: You can also drink half-strength sports drinks (e.g., Gatorade, Powerade) to help treat and prevent dehydration. Mix the sports drink half and half with water.  * Avoid caffeinated beverages. Reason: Caffeine is mildly dehydrating.  * Avoid alcohol beverages (e.g., beer, wine, hard liquor).  * Avoid carbonated soft drinks (soda) as these can make your diarrhea worse.    3: FOOD AND NUTRITION DURING MILD TO MODERATE DIARRHEA:  * Maintaining some food intake during episodes of diarrhea is important.  * Begin with boiled starches / cereals (e.g., potatoes, rice, noodles, wheat, oats) with a small amount of salt to taste.  * You can also eat bananas, yogurt, crackers, soup.  * Eat smaller meals and snacks more often during the day rather than 3 larger meals.  * As the diarrhea starts to get better, you can slowly return to a normal diet.  * AVOID milk and dairy products if these make your diarrhea worse.  * AVOID greasy, fatty or spicy foods.   4: DIARRHEA MEDICINE - LOPERAMIDE (IMODIUM AD):  * This medicine helps decrease diarrhea. It is available over-the-counter (OTC) in a drugstore.  * Adult dosage: 4 mg (2 capsules) is the recommended first dose. You may take an additional 2 mg (1 capsule) after each loose stool.  * Maximum dosage: 16 mg per day  (8 capsules).  * Do not use for more than 2 days.   5: DIARRHEA MEDICINE - LOPERAMIDE - EXTRA NOTES AND WARNINGS:  * DO NOT use if there is a fever over 100.4 F (38.0 C) or if there is blood or mucus in the stools.  * DO NOT drink tonic water. It can interact with loperamide and may cause a serious heart problems.  * Before taking any medicine, read all the instructions on the package.   6: DIARRHEA MEDICINE - BISMUTH SUBSALICYLATE (E.G., KAOPECTATE, PEPTO-BISMOL):  * This medicine can help reduce diarrhea, vomiting, and abdominal cramping. It is available over-the-counter (OTC) in a drugstore.  * Adult dosage: Take two tablets or two tablespoons by mouth every hour (if diarrhea continues) to a maximum of 8 doses in a 24 hour period.  * Do not use for more than 2 days.   7: DIARRHEA MEDICINE - BISMUTH SUBSALICYLATE - EXTRA NOTES AND WARNINGS:  * May cause a temporary darkening of stool and tongue.  * Do not use if allergic to aspirin.  * Do not use in pregnancy.  * Before taking any medicine, read all the instructions on the package.   8: CONTAGIOUSNESS:  * Wash your hands after using the bathroom.  * Wash your hands before fixing or eating food.  * If your work is cooking, Research scientist (medical), serving, or preparing food, then you should not work until Do he diarrhea has completely stopped. Check with  your employer before going back to work.  * Wash soiled towels, sheets, or clothes separately.  * Do not share towels or sheets.  * Do not swim for 2 weeks after diarrhea is gone.   9: CALL BACK IF:  * Signs of dehydration occur (e.g., no urine over 12 hours, very dry mouth, lightheaded, etc.)  * Bloody stools  * Constant or severe abdomen pain  * You become worse   10: CARE ADVICE given per Diarrhea (Adult) guideline.

## 2021-02-17 ENCOUNTER — Encounter (INDEPENDENT_AMBULATORY_CARE_PROVIDER_SITE_OTHER): Payer: Self-pay

## 2021-02-17 ENCOUNTER — Telehealth: Payer: Self-pay

## 2021-02-17 NOTE — Telephone Encounter (Signed)
Called pt and discussed the following: Temperature is the same and is noted to be less than 100.4:  Continue to monitor at home.  Advise patient to stay hydrated, push oral fluids if able, and advise pt. to avoid using multiple blankets and layer of clothing to prevent overheating.  Avoid over use of anti-pyretic medications for fevers less than 101 degrees Fahrenheit. Fever helps fight infection  Worsening temperature, treat if temperature is > 101:  treat with OTC anti-fever medications (Tylenol and/or Ibuprofen)  May alternate Tylenol and Ibuprofen every 3 hours as needed for fever greater than 101. Example: Tylenol at 9AM, Ibuprofen at 12PM, Tylenol at 3PM, Ibuprofen at 6PM, Tylenol at 9PM, Ibuprofen at 12AM, Tylenol at 3AM, Ibuprofen at 6AM. Then restart rotation with Tylenol at 9AM.  If fever remains for greater than 3 days, notify PCP.  If fever becomes greater than 103 and unable to reduce with over the counter medication, contact PCP.   Pt verbalized understanding.

## 2021-02-23 ENCOUNTER — Telehealth: Payer: 59 | Admitting: Physician Assistant

## 2021-02-23 DIAGNOSIS — U071 COVID-19: Secondary | ICD-10-CM

## 2021-02-23 NOTE — Progress Notes (Signed)
Patient needing a work note, seen on 02/09/21

## 2021-03-05 ENCOUNTER — Telehealth: Payer: Self-pay

## 2021-03-05 NOTE — Telephone Encounter (Signed)
Medication: Rimegepant Sulfate (NURTEC) 75 MG TBDP Prior authorization submitted via CoverMyMeds on 03/05/2021 PA submission pending

## 2021-03-08 ENCOUNTER — Ambulatory Visit (INDEPENDENT_AMBULATORY_CARE_PROVIDER_SITE_OTHER): Payer: 59 | Admitting: Diagnostic Neuroimaging

## 2021-03-08 ENCOUNTER — Encounter: Payer: 59 | Admitting: Diagnostic Neuroimaging

## 2021-03-08 DIAGNOSIS — Z0289 Encounter for other administrative examinations: Secondary | ICD-10-CM

## 2021-03-08 DIAGNOSIS — G629 Polyneuropathy, unspecified: Secondary | ICD-10-CM | POA: Diagnosis not present

## 2021-03-08 DIAGNOSIS — G43109 Migraine with aura, not intractable, without status migrainosus: Secondary | ICD-10-CM

## 2021-03-08 NOTE — Procedures (Signed)
[facility]  NCS (NERVE CONDUCTION STUDY) WITH EMG (ELECTROMYOGRAPHY) REPORT   STUDY DATE: 03/08/21 PATIENT NAME: [name] DOB: April 18, 1976 MRN: [id number]  ORDERING CLINICIAN: Joycelyn Schmid, MD   TECHNOLOGIST: Durenda Age ELECTROMYOGRAPHER: Glenford Bayley. Taha Dimond, MD  CLINICAL INFORMATION: 45 year old female with bilateral burning feet and pain.  FINDINGS: NERVE CONDUCTION STUDY:  Bilateral tibial and right peroneal motor responses are normal.  Bilateral sural and right superficial peroneal sensory responses are normal.  Bilateral tibial F wave latencies are borderline prolonged.     NEEDLE ELECTROMYOGRAPHY:  Needle examination of right lower extremity vastus medialis, tibialis anterior, gastrocnemius is normal.   IMPRESSION:   Study demonstrates: - Borderline prolongation of tibial F-wave latencies could be related to proximal S1 radiculopathies.  Remainder of studies unremarkable.  No underlying evidence of large fiber neuropathy.    INTERPRETING PHYSICIAN:  Suanne Marker, MD Certified in Neurology, Neurophysiology and Neuroimaging  [facility] [address] [place], [place] [address] [phone]   [facility]    Nerve / Sites Muscle Latency Ref. Amplitude Ref. Rel Amp Segments Distance Velocity Ref. Area    ms ms mV mV %  cm m/s m/s mVms  R Peroneal - EDB     Ankle EDB 5.0 ?6.5 4.3 ?2.0 100 Ankle - EDB 9   17.2     Fib head EDB 12.1  3.9  89.3 Fib head - Ankle 31 44 ?44 16.0     Pop fossa EDB 14.4  3.7  97 Pop fossa - Fib head 10 44 ?44 15.9         Pop fossa - Ankle      R Tibial - AH     Ankle AH 4.4 ?5.8 7.0 ?4.0 100 Ankle - AH 9   17.4     Pop fossa AH 15.0  4.9  70.7 Pop fossa - Ankle 44 42 ?41 15.7  L Tibial - AH     Ankle AH 4.0 ?5.8 8.3 ?4.0 100 Ankle - AH 9   25.2     Pop fossa AH 14.6  6.1  73.9 Pop fossa - Ankle 44 41 ?41 21.6           SNC    Nerve / Sites Rec. Site Peak Lat Ref.  Amp  Ref. Segments Distance    ms ms V V  cm  R Sural - Ankle (Calf)     Calf Ankle 4.3 ?4.4 6 ?6 Calf - Ankle 14  L Sural - Ankle (Calf)     Calf Ankle 4.3 ?4.4 6 ?6 Calf - Ankle 14  R Superficial peroneal - Ankle     Lat leg Ankle 4.1 ?4.4 6 ?6 Lat leg - Ankle 14           F  Wave    Nerve F Lat Ref.   ms ms  R Tibial - AH 60.1 ?56.0  L Tibial - AH 59.1 ?56.0         EMG Summary Table    Spontaneous MUAP Recruitment  Muscle IA Fib PSW Fasc Other Amp Dur. Poly Pattern  R. Vastus medialis Normal None None None _______ Normal Normal Normal Normal  R. Tibialis anterior Normal None None None _______ Normal Normal Normal Normal  R. Gastrocnemius (Medial head) Normal None None None _______ Normal Normal Normal Normal

## 2021-04-04 ENCOUNTER — Ambulatory Visit: Payer: 59 | Admitting: Family Medicine

## 2021-04-08 ENCOUNTER — Telehealth: Payer: 59 | Admitting: Emergency Medicine

## 2021-04-08 DIAGNOSIS — N898 Other specified noninflammatory disorders of vagina: Secondary | ICD-10-CM

## 2021-04-08 MED ORDER — METRONIDAZOLE 0.75 % VA GEL: 1.0000 | Freq: Every day | VAGINAL | 0 refills | Status: AC

## 2021-04-08 MED ORDER — FLUCONAZOLE 150 MG PO TABS: ORAL_TABLET | ORAL | 0 refills | Status: DC

## 2021-04-08 NOTE — Patient Instructions (Signed)
[name], thank you for joining [place], PA-C for today's virtual visit.  While this provider is not your primary care provider (PCP), if your PCP is located in our provider database this encounter information will be shared with them immediately following your visit.  Consent: (Patient) [name] provided verbal consent for this virtual visit at the beginning of the encounter.  Current Medications:  Current Outpatient Medications:    fluconazole (DIFLUCAN) 150 MG tablet, Take one dose by mouth, wait 72 hours, and then take second dose by mouth, Disp: 2 tablet, Rfl: 0   metroNIDAZOLE (METROGEL) 0.75 % vaginal gel, Place 1 Applicatorful vaginally at bedtime for 5 days., Disp: 70 g, Rfl: 0   ALPRAZolam (XANAX) 0.5 MG tablet, for sedation before MRI scan; take 1 tab 1 hour before scan; may repeat 1 tab 15 min before scan, Disp: 3 tablet, Rfl: 0   amitriptyline (ELAVIL) 50 MG tablet, Take 1 tablet (50 mg total) by mouth at bedtime., Disp: 90 tablet, Rfl: 4   cyanocobalamin (,VITAMIN B-12,) 1000 MCG/ML injection, Inject 1 mL (1,000 mcg total) into the muscle every 30 (thirty) days., Disp: 3 mL, Rfl: 3   dicyclomine (BENTYL) 10 MG capsule, Take 1 capsule (10 mg total) by mouth 4 (four) times daily -  before meals and at bedtime. (Patient taking differently: Take 10 mg by mouth in the morning and at bedtime.), Disp: 90 capsule, Rfl: 1   fluticasone (FLONASE) 50 MCG/ACT nasal spray, Place 2 sprays into both nostrils daily., Disp: 16 g, Rfl: 6   gabapentin (NEURONTIN) 300 MG capsule, Take 1-3 capsules (300-900 mg total) by mouth 3 (three) times daily. Take 300mg  qhs x1 week then may increase to 300mg  TID, Disp: 270 capsule, Rfl: 6   olmesartan-hydrochlorothiazide (BENICAR HCT) 40-12.5 MG tablet, Take 1 tablet by mouth daily., Disp: 90 tablet, Rfl: 2   Rimegepant Sulfate (NURTEC) 75 MG TBDP, Take 75 mg by mouth daily as needed (migraine)., Disp: 15 tablet, Rfl: 3   Syringe/Needle,  Disp, (SYRINGE 3CC/25GX1-1/2") 25G X 1-1/2" 3 ML MISC, Use to inject B12, Disp: 25 each, Rfl: 0   vitamin B-12 (CYANOCOBALAMIN) 1000 MCG tablet, Take 1,000 mcg by mouth daily., Disp: , Rfl:    Vitamin D, Ergocalciferol, (DRISDOL) 1.25 MG (50000 UNIT) CAPS capsule, Take 1 capsule (50,000 Units total) by mouth every 7 (seven) days., Disp: 12 capsule, Rfl: 0   Medications ordered in this encounter:  Meds ordered this encounter  Medications   fluconazole (DIFLUCAN) 150 MG tablet    Sig: Take one dose by mouth, wait 72 hours, and then take second dose by mouth    Dispense:  2 tablet    Refill:  0    Order Specific Question:   Supervising Provider    Answer:   Sabra Heck, BRIAN [[id number]]   metroNIDAZOLE (METROGEL) 0.75 % vaginal gel    Sig: Place 1 Applicatorful vaginally at bedtime for 5 days.    Dispense:  70 g    Refill:  0    Order Specific Question:   Supervising Provider    Answer:   Sabra Heck, Brent     *If you need refills on other medications prior to your next appointment, please contact your pharmacy*  Follow-Up: Call back or seek an in-person evaluation if the symptoms worsen or if the condition fails to improve as anticipated.  Other Instructions Prescribed metronidazole gel use as directed and to completion Prescribed diflucan take once daily by mouth and then  repeat dose 72 hours later Follow up with PCP as needed Follow up in person with PCP, urgent care or go to ER if you have any new or worsening symptoms fever, chills, nausea, vomiting, abdominal pain, vaginal discharge, vaginal odor, vaginal pain, urinary symptoms, etc...  If you have been instructed to have an in-person evaluation today at a local Urgent Care facility, please use the link below. It will take you to a list of all of our available [facility], including address, phone number and hours of operation. Please do not delay care.  [facility]  If you or a family member do not have a  primary care provider, use the link below to schedule a visit and establish care. When you choose a [facility] primary care physician or advanced practice provider, you gain a long-term partner in health. Find a Primary Care Provider  Learn more about [facility] in-office and virtual care options: [facility]

## 2021-04-08 NOTE — Progress Notes (Signed)
Virtual Visit Consent   [name], you are scheduled for a virtual visit with a [facility] provider today.     Just as with appointments in the office, your consent must be obtained to participate.  Your consent will be active for this visit and any virtual visit you may have with one of our providers in the next 365 days.     If you have a MyChart account, a copy of this consent can be sent to you electronically.  All virtual visits are billed to your insurance company just like a traditional visit in the office.    As this is a virtual visit, video technology does not allow for your provider to perform a traditional examination.  This may limit your provider's ability to fully assess your condition.  If your provider identifies any concerns that need to be evaluated in person or the need to arrange testing (such as labs, EKG, etc.), we will make arrangements to do so.     Although advances in technology are sophisticated, we cannot ensure that it will always work on either your end or our end.  If the connection with a video visit is poor, the visit may have to be switched to a telephone visit.  With either a video or telephone visit, we are not always able to ensure that we have a secure connection.     I need to obtain your verbal consent now.   Are you willing to proceed with your visit today?    [name] has provided verbal consent on 04/08/2021 for a virtual visit (video or telephone).   Lestine Box, [place]   Date: 04/08/2021 7:39 PM   Virtual Visit via Video Note   I, Lestine Box, connected with  [name]  ([id number], 1976-04-11) on 04/08/21 at  7:30 PM EST by a video-enabled telemedicine application and verified that I am speaking with the correct person using two identifiers.  Location: Patient: Virtual Visit Location Patient: Home Provider: Virtual Visit Location Provider: Home Office   I discussed the limitations of evaluation and management by  telemedicine and the availability of in person appointments. The patient expressed understanding and agreed to proceed.    History of Present Illness: [name] is a 45 y.o. who identifies as a female who was assigned female at birth, and is being seen today for vaginal itching x 2 weeks.  Admits to using a different vaginal wash prior to symptoms.  Last sex apx 3 months ago.  She has tried OTC monistat cream without relief.  Denies aggravating factors.  She reports similar symptoms in the past and was diagnosed with BV.  She denies fever, chills, nausea, vomiting, vaginal discharge, vaginal odor, vaginal bleeding, dyspareunia, vaginal lesions.   HPI: HPI  Problems:  Patient Active Problem List   Diagnosis Date Noted   B12 deficiency 01/24/2021   Corneal abrasion 11/25/2020   Elevated MCV 11/25/2020   Visual changes 11/25/2020   Migraine 11/15/2020   Elevated liver enzymes 11/02/2020   Peripheral neuropathy 10/11/2020   Irritable bowel syndrome with diarrhea 02/02/2018   Generalized anxiety disorder 02/02/2018   Perirectal abscess 10/28/2016   Chronic fatigue 01/09/2016   Adrenal nodule ([place]) 01/09/2016   Chronic low back pain 11/21/2015   Benign essential hypertension 08/13/2015   Elevated lipase 12/14/2014   AP (abdominal pain) 12/14/2014   Nausea and vomiting 11/29/2014   Intractable abdominal pain 11/28/2014   Hypokalemia 11/28/2014   HTN (hypertension)  11/28/2014   Pilonidal abscess 12/03/2013   Patellar instability of left knee 06/08/2013    Allergies:  Allergies  Allergen Reactions   Penicillins Anaphylaxis, Swelling and Rash    Has patient had a PCN reaction causing immediate rash, facial/tongue/throat swelling, SOB or lightheadedness with hypotension: Yes Has patient had a PCN reaction causing severe rash involving mucus membranes or skin necrosis: No Has patient had a PCN reaction that required hospitalization No Has patient had a PCN reaction occurring within  the last 10 years: No If all of the above answers are "NO", then may proceed with Cephalosporin use.  Face and throat swell   Medications:  Current Outpatient Medications:    fluconazole (DIFLUCAN) 150 MG tablet, Take one dose by mouth, wait 72 hours, and then take second dose by mouth, Disp: 2 tablet, Rfl: 0   metroNIDAZOLE (METROGEL) 0.75 % vaginal gel, Place 1 Applicatorful vaginally at bedtime for 5 days., Disp: 70 g, Rfl: 0   ALPRAZolam (XANAX) 0.5 MG tablet, for sedation before MRI scan; take 1 tab 1 hour before scan; may repeat 1 tab 15 min before scan, Disp: 3 tablet, Rfl: 0   amitriptyline (ELAVIL) 50 MG tablet, Take 1 tablet (50 mg total) by mouth at bedtime., Disp: 90 tablet, Rfl: 4   cyanocobalamin (,VITAMIN B-12,) 1000 MCG/ML injection, Inject 1 mL (1,000 mcg total) into the muscle every 30 (thirty) days., Disp: 3 mL, Rfl: 3   dicyclomine (BENTYL) 10 MG capsule, Take 1 capsule (10 mg total) by mouth 4 (four) times daily -  before meals and at bedtime. (Patient taking differently: Take 10 mg by mouth in the morning and at bedtime.), Disp: 90 capsule, Rfl: 1   fluticasone (FLONASE) 50 MCG/ACT nasal spray, Place 2 sprays into both nostrils daily., Disp: 16 g, Rfl: 6   gabapentin (NEURONTIN) 300 MG capsule, Take 1-3 capsules (300-900 mg total) by mouth 3 (three) times daily. Take 300mg  qhs x1 week then may increase to 300mg  TID, Disp: 270 capsule, Rfl: 6   olmesartan-hydrochlorothiazide (BENICAR HCT) 40-12.5 MG tablet, Take 1 tablet by mouth daily., Disp: 90 tablet, Rfl: 2   Rimegepant Sulfate (NURTEC) 75 MG TBDP, Take 75 mg by mouth daily as needed (migraine)., Disp: 15 tablet, Rfl: 3   Syringe/Needle, Disp, (SYRINGE 3CC/25GX1-1/2") 25G X 1-1/2" 3 ML MISC, Use to inject B12, Disp: 25 each, Rfl: 0   vitamin B-12 (CYANOCOBALAMIN) 1000 MCG tablet, Take 1,000 mcg by mouth daily., Disp: , Rfl:    Vitamin D, Ergocalciferol, (DRISDOL) 1.25 MG (50000 UNIT) CAPS capsule, Take 1 capsule (50,000  Units total) by mouth every 7 (seven) days., Disp: 12 capsule, Rfl: 0  Observations/Objective: Patient is well-developed, well-nourished in no acute distress.  Resting comfortably at home.  Head is normocephalic, atraumatic.  No labored breathing. Speaking in full sentences and tolerating own secretions Speech is clear and coherent with logical content.  Patient is alert and oriented at baseline.    Assessment and Plan: 1. Vaginal itching Prescribed metronidazole gel use as directed and to completion Prescribed diflucan take once daily by mouth and then repeat dose 72 hours later Follow up with PCP as needed Follow up in person with PCP, urgent care or go to ER if you have any new or worsening symptoms fever, chills, nausea, vomiting, abdominal pain, vaginal discharge, vaginal odor, vaginal pain, urinary symptoms, etc...  Follow Up Instructions: I discussed the assessment and treatment plan with the patient. The patient was provided an opportunity to ask questions  and all were answered. The patient agreed with the plan and demonstrated an understanding of the instructions.  A copy of instructions were sent to the patient via MyChart unless otherwise noted below.   The patient was advised to call back or seek an in-person evaluation if the symptoms worsen or if the condition fails to improve as anticipated.  Time:  I spent 10 minutes with the patient via telehealth technology discussing the above problems/concerns.    Lestine Box, PA-C

## 2021-04-19 ENCOUNTER — Other Ambulatory Visit: Payer: Self-pay

## 2021-04-19 ENCOUNTER — Encounter (HOSPITAL_BASED_OUTPATIENT_CLINIC_OR_DEPARTMENT_OTHER): Payer: Self-pay | Admitting: Obstetrics and Gynecology

## 2021-04-19 ENCOUNTER — Emergency Department (HOSPITAL_BASED_OUTPATIENT_CLINIC_OR_DEPARTMENT_OTHER)
Admission: EM | Admit: 2021-04-19 | Discharge: 2021-04-19 | Disposition: A | Discharge status: Home / Self Care | Payer: 59 | Attending: Emergency Medicine | Admitting: Emergency Medicine

## 2021-04-19 ENCOUNTER — Emergency Department (HOSPITAL_BASED_OUTPATIENT_CLINIC_OR_DEPARTMENT_OTHER): Payer: 59 | Admitting: Radiology

## 2021-04-19 DIAGNOSIS — Y9301 Activity, walking, marching and hiking: Secondary | ICD-10-CM | POA: Diagnosis not present

## 2021-04-19 DIAGNOSIS — M79672 Pain in left foot: Secondary | ICD-10-CM | POA: Diagnosis not present

## 2021-04-19 DIAGNOSIS — F17210 Nicotine dependence, cigarettes, uncomplicated: Secondary | ICD-10-CM | POA: Diagnosis not present

## 2021-04-19 DIAGNOSIS — M79671 Pain in right foot: Secondary | ICD-10-CM | POA: Diagnosis not present

## 2021-04-19 DIAGNOSIS — M7989 Other specified soft tissue disorders: Secondary | ICD-10-CM | POA: Diagnosis not present

## 2021-04-19 DIAGNOSIS — M25561 Pain in right knee: Secondary | ICD-10-CM | POA: Insufficient documentation

## 2021-04-19 DIAGNOSIS — Z79899 Other long term (current) drug therapy: Secondary | ICD-10-CM | POA: Diagnosis not present

## 2021-04-19 DIAGNOSIS — R69 Illness, unspecified: Secondary | ICD-10-CM | POA: Diagnosis not present

## 2021-04-19 DIAGNOSIS — W19XXXA Unspecified fall, initial encounter: Secondary | ICD-10-CM

## 2021-04-19 DIAGNOSIS — W108XXA Fall (on) (from) other stairs and steps, initial encounter: Secondary | ICD-10-CM | POA: Insufficient documentation

## 2021-04-19 DIAGNOSIS — S99912A Unspecified injury of left ankle, initial encounter: Secondary | ICD-10-CM | POA: Diagnosis present

## 2021-04-19 DIAGNOSIS — S93402A Sprain of unspecified ligament of left ankle, initial encounter: Secondary | ICD-10-CM | POA: Diagnosis not present

## 2021-04-19 DIAGNOSIS — I10 Essential (primary) hypertension: Secondary | ICD-10-CM | POA: Insufficient documentation

## 2021-04-19 MED ORDER — IBUPROFEN 800 MG PO TABS
800.0000 mg | ORAL_TABLET | Freq: Once | ORAL | Status: AC
  Administered 2021-04-19: 800 mg via ORAL
  Filled 2021-04-19: qty 1

## 2021-04-19 MED ORDER — OXYCODONE-ACETAMINOPHEN 5-325 MG PO TABS
1.0000 | ORAL_TABLET | ORAL | Status: DC | PRN
  Administered 2021-04-19: 1 via ORAL
  Filled 2021-04-19: qty 1

## 2021-04-19 NOTE — ED Provider Notes (Signed)
[facility] EMERGENCY DEPT Provider Note   CSN: [id number] Arrival date & time: 04/19/21  1654     History Chief Complaint  Patient presents with   Fall    [name] is a 45 y.o. female with a past medical history of arthritis in her knee presenting today after a fall that occurred last night.  Patient was walking down the stairs when she missed a step and fell to the ground.  Complaining of left ankle, left foot and right knee pain.  Also feels as though she is having a flare of her sciatica.  Reports difficulty bearing weight on her left foot.   Past Medical History:  Diagnosis Date   Adnexal cyst 2013   right   Anemia 2015   Pernicious and iron deficiency. As of 2015/2016 no longer requiring po iron or B12 shots.    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 arthroscopy x 2.    Colitis    Headache(784.0)    migraines   Hydradenitis 2001   axillary, multiple surgical I & Ds/excisions.   Hypertension    Nausea & vomiting 11/28/2014   Obesity (BMI 30.0-34.9)    Pilonidal cyst    Pityriasis rosea    pt also gives hx of seborrheic dermatitis.     Patient Active Problem List   Diagnosis Date Noted   B12 deficiency 01/24/2021   Corneal abrasion 11/25/2020   Elevated MCV 11/25/2020   Visual changes 11/25/2020   Migraine 11/15/2020   Elevated liver enzymes 11/02/2020   Peripheral neuropathy 10/11/2020   Irritable bowel syndrome with diarrhea 02/02/2018   Generalized anxiety disorder 02/02/2018   Perirectal abscess 10/28/2016   Chronic fatigue 01/09/2016   Adrenal nodule ([place]) 01/09/2016   Chronic low back pain 11/21/2015   Benign essential hypertension 08/13/2015   Elevated lipase 12/14/2014   AP (abdominal pain) 12/14/2014   Nausea and vomiting 11/29/2014   Intractable abdominal pain 11/28/2014   Hypokalemia 11/28/2014   HTN (hypertension) 11/28/2014   Pilonidal abscess 12/03/2013   Patellar instability of left knee 06/08/2013    Past Surgical  History:  Procedure Laterality Date   ABDOMINAL HYSTERECTOMY     CYSTECTOMY     2004 axilla bil   ESOPHAGOGASTRODUODENOSCOPY N/A 11/30/2014   Procedure: ESOPHAGOGASTRODUODENOSCOPY (EGD);  Surgeon: Jerene Bears, MD;  Location: [facility] ENDOSCOPY;  Service: Endoscopy;  Laterality: N/A;   FRACTURE SURGERY     rt arm   GASTRIC BYPASS  2011   gastric sleeve   KNEE SURGERY  2005   left   LAPAROSCOPIC CHOLECYSTECTOMY  2002   MEDIAL PATELLOFEMORAL LIGAMENT REPAIR Left 06/08/2013   Procedure: DIAGNOSTIC OPERATIVE ARTHROSCOPY, OPEN MEDIAL PATELLA FEMORAL LIGAMENT RECONSTRUCTION;  Surgeon: Meredith Pel, MD;  Location: [facility];  Service: Orthopedics;  Laterality: Left;  with Hamstring autograft   OVARIAN CYST REMOVAL     PARTIAL HYSTERECTOMY     R arm surgery       OB History     Gravida  5   Para  4   Term  4   Preterm  0   AB  1   Living  4      SAB  1   IAB  0   Ectopic  0   Multiple  0   Live Births  4           Family History  Problem Relation Age of Onset   Lupus Mother    Stroke Mother  Hypertension Mother    Crohn's disease Father    Hypertension Father    Gallbladder disease Father    Cancer Maternal Grandfather        lung   COPD Maternal Grandfather    Emphysema Maternal Grandfather    Lung cancer Maternal Grandmother    Stroke Maternal Grandmother    Coronary artery disease Paternal Grandfather     Social History   Tobacco Use   Smoking status: Every Day    Packs/day: 0.50    Years: 10.00    Pack years: 5.00    Types: Cigarettes    Start date: 05/20/2013   Smokeless tobacco: Never  Vaping Use   Vaping Use: Every day   Substances: Nicotine, Flavoring  Substance Use Topics   Alcohol use: Yes    Alcohol/week: 1.0 - 2.0 standard drink    Types: 1 - 2 Standard drinks or equivalent per week    Comment: occ   Drug use: Yes    Types: Other-see comments    Comment: CBD rub    Home Medications Prior to Admission medications   Medication  Sig Start Date End Date Taking? Authorizing Provider  ALPRAZolam Duanne Moron) 0.5 MG tablet for sedation before MRI scan; take 1 tab 1 hour before scan; may repeat 1 tab 15 min before scan 01/19/21   Penumalli, Earlean Polka, MD  amitriptyline (ELAVIL) 50 MG tablet Take 1 tablet (50 mg total) by mouth at bedtime. 01/19/21   Penumalli, Earlean Polka, MD  cyanocobalamin (,VITAMIN B-12,) 1000 MCG/ML injection Inject 1 mL (1,000 mcg total) into the muscle every 30 (thirty) days. 01/24/21 04/24/21  Luetta Nutting, DO  dicyclomine (BENTYL) 10 MG capsule Take 1 capsule (10 mg total) by mouth 4 (four) times daily -  before meals and at bedtime. Patient taking differently: Take 10 mg by mouth in the morning and at bedtime. 10/05/20   Luetta Nutting, DO  fluconazole (DIFLUCAN) 150 MG tablet Take one dose by mouth, wait 72 hours, and then take second dose by mouth 04/08/21   Wurst, [place], PA-C  fluticasone (FLONASE) 50 MCG/ACT nasal spray Place 2 sprays into both nostrils daily. 02/09/21   Sharion Balloon, FNP  gabapentin (NEURONTIN) 300 MG capsule Take 1-3 capsules (300-900 mg total) by mouth 3 (three) times daily. Take 300mg  qhs x1 week then may increase to 300mg  TID 01/19/21   Penumalli, Earlean Polka, MD  olmesartan-hydrochlorothiazide (BENICAR HCT) 40-12.5 MG tablet Take 1 tablet by mouth daily. 01/24/21   Luetta Nutting, DO  Rimegepant Sulfate (NURTEC) 75 MG TBDP Take 75 mg by mouth daily as needed (migraine). 12/27/20   Luetta Nutting, DO  Syringe/Needle, Disp, (SYRINGE 3CC/25GX1-1/2") 25G X 1-1/2" 3 ML MISC Use to inject B12 01/24/21   Luetta Nutting, DO  vitamin B-12 (CYANOCOBALAMIN) 1000 MCG tablet Take 1,000 mcg by mouth daily.    [provider]  Vitamin D, Ergocalciferol, (DRISDOL) 1.25 MG (50000 UNIT) CAPS capsule Take 1 capsule (50,000 Units total) by mouth every 7 (seven) days. 10/13/20   Luetta Nutting, DO    Allergies    Penicillins  Review of Systems   Review of Systems  Musculoskeletal:  Positive for  arthralgias, back pain and gait problem. Negative for myalgias.  Skin:  Negative for wound.  Neurological:  Negative for syncope and numbness.   Physical Exam Updated Vital Signs BP (!) 177/100   Pulse 79   Temp 99.2 F (37.3 C)   Resp 16   Ht 5\' 11"  (1.803 m)  Wt 79.4 kg   SpO2 100%   BMI 24.41 kg/m   Physical Exam Vitals and nursing note reviewed.  Constitutional:      Appearance: Normal appearance.  HENT:     Head: Normocephalic and atraumatic.  Eyes:     General: No scleral icterus.    Conjunctiva/sclera: Conjunctivae normal.  Pulmonary:     Effort: Pulmonary effort is normal. No respiratory distress.  Musculoskeletal:        General: Tenderness present.     Comments: Full range of motion of bilateral ankles and MCPs.  Full range of motion of bilateral knees.  Inflammation and bruising to the lateral left ankle.  Consistent with ankle sprain.  Skin:    Findings: Bruising present. No rash.  Neurological:     Mental Status: She is alert.  Psychiatric:        Mood and Affect: Mood normal.    ED Results / Procedures / Treatments   Labs (all labs ordered are listed, but only abnormal results are displayed) Labs Reviewed - No data to display  EKG None  Radiology DG Ankle Complete Left  Result Date: 04/19/2021 CLINICAL DATA:  Fall down stairs yesterday with persistent ankle pain, initial encounter EXAM: LEFT ANKLE COMPLETE - 3+ VIEW COMPARISON:  None. FINDINGS: Mild soft tissue swelling is noted laterally. No acute fracture or dislocation is noted. Mild tarsal degenerative changes are seen. Calcaneal spurring is noted. IMPRESSION: Soft tissue swelling without acute bony abnormality. Electronically Signed   By: Inez Catalina M.D.   On: 04/19/2021 17:35   DG Knee Complete 4 Views Right  Result Date: 04/19/2021 CLINICAL DATA:  Fall yesterday with right knee pain, initial encounter EXAM: RIGHT KNEE - COMPLETE 4+ VIEW COMPARISON:  01/25/2018 FINDINGS: Stable avulsion  from the superior aspect of the medial femoral condyle is noted. Mild degenerative changes are noted in all 3 joint compartments. No joint effusion is seen. No acute fracture or dislocation is noted. IMPRESSION: Chronic avulsion along the superior aspect of the medial femoral condyle. Electronically Signed   By: Inez Catalina M.D.   On: 04/19/2021 17:37   DG Foot Complete Right  Result Date: 04/19/2021 CLINICAL DATA:  Fall yesterday with right foot pain, initial encounter EXAM: RIGHT FOOT COMPLETE - 3+ VIEW COMPARISON:  10/15/2018 FINDINGS: No acute fracture or dislocation is noted. No soft tissue changes are seen. Tarsal degenerative changes and calcaneal spurring is noted. Healed second proximal phalangeal fracture is noted. IMPRESSION: No acute abnormality noted. Electronically Signed   By: Inez Catalina M.D.   On: 04/19/2021 17:41    Procedures Procedures   Medications Ordered in ED Medications  oxyCODONE-acetaminophen (PERCOCET/ROXICET) 5-325 MG per tablet 1 tablet (1 tablet Oral Given 04/19/21 1711)    ED Course  I have reviewed the triage vital signs and the nursing notes.  Pertinent labs & imaging results that were available during my care of the patient were reviewed by me and considered in my medical decision making (see chart for details).    MDM Rules/Calculators/A&P Patient fully evaluated by me.  X-rays are negative however with a clear left ankle sprain.  Bruising and inflammation noted.  Tenderness to the right medial portion of the knee.  She does have a chronic avulsion fracture noted to this area.  She was notified of this, but reports that she has an orthopedic doctor that she can follow-up with.  Originally requested trazodone for her pain and sleep, I discussed that this is not a medication  for ankle sprains.  She will use NSAIDs and Tylenol for her pain and inflammation and follow-up with her orthopedics.  Final Clinical Impression(s) / ED Diagnoses Final diagnoses:   Sprain of left ankle, unspecified ligament, initial encounter  Fall, initial encounter    Rx / DC Orders Results and diagnoses were explained to the patient. Return precautions discussed in full. Patient had no additional questions and expressed complete understanding.  This chart was dictated using voice recognition software.  Despite best efforts to proofread,  errors can occur which can change the documentation meaning.    Rhae Hammock, PA-C 04/19/21 [place] A, DO 04/19/21 [id number]

## 2021-04-19 NOTE — Discharge Instructions (Addendum)
You do not have any fractures today.  Your left ankle is sprained.  There is information about ankle sprains attached to these discharge papers.  As discussed you should try and treat your pain and inflammation with over-the-counter ibuprofen and Tylenol.  Information about RICE therapy also attached to these discharge papers.  Feel free to follow-up with your primary care provider or orthopedist as you see fit.

## 2021-04-19 NOTE — ED Triage Notes (Signed)
Patient reports to the ER for a fall down steps last night. Patient reports pain in her left ankle/foot, right foot, right knee, and her back. Patient reports she missed a step and fell down multiple steps.

## 2021-06-02 ENCOUNTER — Telehealth: Payer: 59 | Admitting: Nurse Practitioner

## 2021-06-02 DIAGNOSIS — H6690 Otitis media, unspecified, unspecified ear: Secondary | ICD-10-CM | POA: Diagnosis not present

## 2021-06-02 MED ORDER — FLUTICASONE PROPIONATE 50 MCG/ACT NA SUSP: 2.0000 | Freq: Every day | NASAL | 6 refills | Status: DC

## 2021-06-02 MED ORDER — IBUPROFEN 600 MG PO TABS: 600.0000 mg | ORAL_TABLET | Freq: Three times a day (TID) | ORAL | 0 refills | Status: AC | PRN

## 2021-06-02 MED ORDER — DOXYCYCLINE HYCLATE 100 MG PO TABS: 100.0000 mg | ORAL_TABLET | Freq: Two times a day (BID) | ORAL | 0 refills | Status: AC

## 2021-06-02 NOTE — Patient Instructions (Signed)
[name], thank you for joining Gildardo Pounds, NP for today's virtual visit.  While this provider is not your primary care provider (PCP), if your PCP is located in our provider database this encounter information will be shared with them immediately following your visit.  Consent: (Patient) [name] provided verbal consent for this virtual visit at the beginning of the encounter.  Current Medications:  Current Outpatient Medications:    doxycycline (VIBRA-TABS) 100 MG tablet, Take 1 tablet (100 mg total) by mouth 2 (two) times daily for 5 days., Disp: 10 tablet, Rfl: 0   ibuprofen (ADVIL) 600 MG tablet, Take 1 tablet (600 mg total) by mouth every 8 (eight) hours as needed., Disp: 30 tablet, Rfl: 0   ALPRAZolam (XANAX) 0.5 MG tablet, for sedation before MRI scan; take 1 tab 1 hour before scan; may repeat 1 tab 15 min before scan, Disp: 3 tablet, Rfl: 0   amitriptyline (ELAVIL) 50 MG tablet, Take 1 tablet (50 mg total) by mouth at bedtime., Disp: 90 tablet, Rfl: 4   dicyclomine (BENTYL) 10 MG capsule, Take 1 capsule (10 mg total) by mouth 4 (four) times daily -  before meals and at bedtime. (Patient taking differently: Take 10 mg by mouth in the morning and at bedtime.), Disp: 90 capsule, Rfl: 1   fluconazole (DIFLUCAN) 150 MG tablet, Take one dose by mouth, wait 72 hours, and then take second dose by mouth, Disp: 2 tablet, Rfl: 0   fluticasone (FLONASE) 50 MCG/ACT nasal spray, Place 2 sprays into both nostrils daily., Disp: 16 g, Rfl: 6   gabapentin (NEURONTIN) 300 MG capsule, Take 1-3 capsules (300-900 mg total) by mouth 3 (three) times daily. Take 300mg  qhs x1 week then may increase to 300mg  TID, Disp: 270 capsule, Rfl: 6   olmesartan-hydrochlorothiazide (BENICAR HCT) 40-12.5 MG tablet, Take 1 tablet by mouth daily., Disp: 90 tablet, Rfl: 2   Rimegepant Sulfate (NURTEC) 75 MG TBDP, Take 75 mg by mouth daily as needed (migraine)., Disp: 15 tablet, Rfl: 3   Syringe/Needle,  Disp, (SYRINGE 3CC/25GX1-1/2") 25G X 1-1/2" 3 ML MISC, Use to inject B12, Disp: 25 each, Rfl: 0   vitamin B-12 (CYANOCOBALAMIN) 1000 MCG tablet, Take 1,000 mcg by mouth daily., Disp: , Rfl:    Vitamin D, Ergocalciferol, (DRISDOL) 1.25 MG (50000 UNIT) CAPS capsule, Take 1 capsule (50,000 Units total) by mouth every 7 (seven) days., Disp: 12 capsule, Rfl: 0   Medications ordered in this encounter:  Meds ordered this encounter  Medications   doxycycline (VIBRA-TABS) 100 MG tablet    Sig: Take 1 tablet (100 mg total) by mouth 2 (two) times daily for 5 days.    Dispense:  10 tablet    Refill:  0    Order Specific Question:   Supervising Provider    Answer:   MILLER, BRIAN [[id number]]   fluticasone (FLONASE) 50 MCG/ACT nasal spray    Sig: Place 2 sprays into both nostrils daily.    Dispense:  16 g    Refill:  6    Order Specific Question:   Supervising Provider    Answer:   MILLER, BRIAN [[id number]]   ibuprofen (ADVIL) 600 MG tablet    Sig: Take 1 tablet (600 mg total) by mouth every 8 (eight) hours as needed.    Dispense:  30 tablet    Refill:  0    Order Specific Question:   Supervising Provider    Answer:   Noemi Chapel [[id number]]     *  If you need refills on other medications prior to your next appointment, please contact your pharmacy*  Follow-Up: Call back or seek an in-person evaluation if the symptoms worsen or if the condition fails to improve as anticipated.  Other Instructions Apply warm compresses to left ear for pain   If you have been instructed to have an in-person evaluation today at a local Urgent Care facility, please use the link below. It will take you to a list of all of our available [facility], including address, phone number and hours of operation. Please do not delay care.  [facility]  If you or a family member do not have a primary care provider, use the link below to schedule a visit and establish care. When you choose a [facility] primary  care physician or advanced practice provider, you gain a long-term partner in health. Find a Primary Care Provider  Learn more about [facility]'s in-office and virtual care options: [facility]

## 2021-06-02 NOTE — Progress Notes (Signed)
Virtual Visit Consent   [name], you are scheduled for a virtual visit with a [facility] provider today.     Just as with appointments in the office, your consent must be obtained to participate.  Your consent will be active for this visit and any virtual visit you may have with one of our providers in the next 365 days.     If you have a MyChart account, a copy of this consent can be sent to you electronically.  All virtual visits are billed to your insurance company just like a traditional visit in the office.    As this is a virtual visit, video technology does not allow for your provider to perform a traditional examination.  This may limit your provider's ability to fully assess your condition.  If your provider identifies any concerns that need to be evaluated in person or the need to arrange testing (such as labs, EKG, etc.), we will make arrangements to do so.     Although advances in technology are sophisticated, we cannot ensure that it will always work on either your end or our end.  If the connection with a video visit is poor, the visit may have to be switched to a telephone visit.  With either a video or telephone visit, we are not always able to ensure that we have a secure connection.     I need to obtain your verbal consent now.   Are you willing to proceed with your visit today?    [name] has provided verbal consent on 06/02/2021 for a virtual visit (video or telephone).   Gildardo Pounds, NP   Date: 06/02/2021 11:25 AM   Virtual Visit via Video Note   I, Gildardo Pounds, connected with  [name]  ([id number], 07/22/1975) on 06/02/21 at 11:15 AM EST by a video-enabled telemedicine application and verified that I am speaking with the correct person using two identifiers.  Location: Patient: Virtual Visit Location Patient: Home Provider: Virtual Visit Location Provider: Home Office   I discussed the limitations of evaluation and management by  telemedicine and the availability of in person appointments. The patient expressed understanding and agreed to proceed.    History of Present Illness: [name] is a 46 y.o. who identifies as a female who was assigned female at birth, and is being seen today for Acute otitis media. Endorses 3 day onset of left ear pain, nasal congestion and cough.  Aggravating factors: chewing, yawning, opening mouth. Denies fever, sore throat or sinus pain.   Problems:  Patient Active Problem List   Diagnosis Date Noted   B12 deficiency 01/24/2021   Corneal abrasion 11/25/2020   Elevated MCV 11/25/2020   Visual changes 11/25/2020   Migraine 11/15/2020   Elevated liver enzymes 11/02/2020   Peripheral neuropathy 10/11/2020   Irritable bowel syndrome with diarrhea 02/02/2018   Generalized anxiety disorder 02/02/2018   Perirectal abscess 10/28/2016   Chronic fatigue 01/09/2016   Adrenal nodule ([place]) 01/09/2016   Chronic low back pain 11/21/2015   Benign essential hypertension 08/13/2015   Elevated lipase 12/14/2014   AP (abdominal pain) 12/14/2014   Nausea and vomiting 11/29/2014   Intractable abdominal pain 11/28/2014   Hypokalemia 11/28/2014   HTN (hypertension) 11/28/2014   Pilonidal abscess 12/03/2013   Patellar instability of left knee 06/08/2013    Allergies:  Allergies  Allergen Reactions   Penicillins Anaphylaxis, Swelling and Rash    Has patient had a PCN  reaction causing immediate rash, facial/tongue/throat swelling, SOB or lightheadedness with hypotension: Yes Has patient had a PCN reaction causing severe rash involving mucus membranes or skin necrosis: No Has patient had a PCN reaction that required hospitalization No Has patient had a PCN reaction occurring within the last 10 years: No If all of the above answers are "NO", then may proceed with Cephalosporin use.  Face and throat swell Penicillin, in outside record, confirmed with patient in session   Medications:   Current Outpatient Medications:    doxycycline (VIBRA-TABS) 100 MG tablet, Take 1 tablet (100 mg total) by mouth 2 (two) times daily for 5 days., Disp: 10 tablet, Rfl: 0   ibuprofen (ADVIL) 600 MG tablet, Take 1 tablet (600 mg total) by mouth every 8 (eight) hours as needed., Disp: 30 tablet, Rfl: 0   ALPRAZolam (XANAX) 0.5 MG tablet, for sedation before MRI scan; take 1 tab 1 hour before scan; may repeat 1 tab 15 min before scan, Disp: 3 tablet, Rfl: 0   amitriptyline (ELAVIL) 50 MG tablet, Take 1 tablet (50 mg total) by mouth at bedtime., Disp: 90 tablet, Rfl: 4   dicyclomine (BENTYL) 10 MG capsule, Take 1 capsule (10 mg total) by mouth 4 (four) times daily -  before meals and at bedtime. (Patient taking differently: Take 10 mg by mouth in the morning and at bedtime.), Disp: 90 capsule, Rfl: 1   fluconazole (DIFLUCAN) 150 MG tablet, Take one dose by mouth, wait 72 hours, and then take second dose by mouth, Disp: 2 tablet, Rfl: 0   fluticasone (FLONASE) 50 MCG/ACT nasal spray, Place 2 sprays into both nostrils daily., Disp: 16 g, Rfl: 6   gabapentin (NEURONTIN) 300 MG capsule, Take 1-3 capsules (300-900 mg total) by mouth 3 (three) times daily. Take 300mg  qhs x1 week then may increase to 300mg  TID, Disp: 270 capsule, Rfl: 6   olmesartan-hydrochlorothiazide (BENICAR HCT) 40-12.5 MG tablet, Take 1 tablet by mouth daily., Disp: 90 tablet, Rfl: 2   Rimegepant Sulfate (NURTEC) 75 MG TBDP, Take 75 mg by mouth daily as needed (migraine)., Disp: 15 tablet, Rfl: 3   Syringe/Needle, Disp, (SYRINGE 3CC/25GX1-1/2") 25G X 1-1/2" 3 ML MISC, Use to inject B12, Disp: 25 each, Rfl: 0   vitamin B-12 (CYANOCOBALAMIN) 1000 MCG tablet, Take 1,000 mcg by mouth daily., Disp: , Rfl:    Vitamin D, Ergocalciferol, (DRISDOL) 1.25 MG (50000 UNIT) CAPS capsule, Take 1 capsule (50,000 Units total) by mouth every 7 (seven) days., Disp: 12 capsule, Rfl: 0  Observations/Objective: Patient is well-developed, well-nourished in no  acute distress.  Resting comfortably  at home.  Head is normocephalic, atraumatic.  No labored breathing.  Speech is clear and coherent with logical content.  Patient is alert and oriented at baseline.    Assessment and Plan: 1. Acute otitis media, unspecified otitis media type - doxycycline (VIBRA-TABS) 100 MG tablet; Take 1 tablet (100 mg total) by mouth 2 (two) times daily for 5 days.  Dispense: 10 tablet; Refill: 0 - fluticasone (FLONASE) 50 MCG/ACT nasal spray; Place 2 sprays into both nostrils daily.  Dispense: 16 g; Refill: 6 - ibuprofen (ADVIL) 600 MG tablet; Take 1 tablet (600 mg total) by mouth every 8 (eight) hours as needed.  Dispense: 30 tablet; Refill: 0    Follow Up Instructions: I discussed the assessment and treatment plan with the patient. The patient was provided an opportunity to ask questions and all were answered. The patient agreed with the plan and demonstrated an understanding  of the instructions.  A copy of instructions were sent to the patient via MyChart unless otherwise noted below.    The patient was advised to call back or seek an in-person evaluation if the symptoms worsen or if the condition fails to improve as anticipated.  Time:  I spent 13 minutes with the patient via telehealth technology discussing the above problems/concerns.    Gildardo Pounds, NP

## 2021-06-03 ENCOUNTER — Emergency Department (HOSPITAL_BASED_OUTPATIENT_CLINIC_OR_DEPARTMENT_OTHER)
Admission: EM | Admit: 2021-06-03 | Discharge: 2021-06-03 | Disposition: A | Discharge status: Home / Self Care | Payer: 59 | Attending: Emergency Medicine | Admitting: Emergency Medicine

## 2021-06-03 ENCOUNTER — Other Ambulatory Visit: Payer: Self-pay

## 2021-06-03 ENCOUNTER — Encounter (HOSPITAL_BASED_OUTPATIENT_CLINIC_OR_DEPARTMENT_OTHER): Payer: Self-pay

## 2021-06-03 DIAGNOSIS — H9201 Otalgia, right ear: Secondary | ICD-10-CM | POA: Diagnosis not present

## 2021-06-03 NOTE — ED Triage Notes (Signed)
Pt arrives POV with c/o bilateral ear pain, right worse than left since Wednesday. Feels a lot of pressure and pain in right ear and jaw, hurts for air to come in contact with ears.  Was prescribed Doxycyline by UC, started taking yesterday but feels she has gotten worse because left ear started hurting.  Feels like right ear is draining.  Denies fevers.

## 2021-06-03 NOTE — Discharge Instructions (Signed)
Recommend NSAIDs such as motrin or naproxen as needed for pain. Follow up with your primary care and ENT specialist. If you develop fever, swelling, loss of hearing or other new concerning symptoms, come back to ER for further evaluation.

## 2021-06-03 NOTE — ED Notes (Signed)
Dc instructions reviewed with patient. Patient voiced understanding. Dc with belongings.  °

## 2021-06-03 NOTE — ED Provider Notes (Signed)
[facility] EMERGENCY DEPT Provider Note   CSN: [id number] Arrival date & time: 06/03/21  4818     History  Chief Complaint  Patient presents with   Otalgia   Jaw Pain    [name] is a 46 y.o. female.  Presented to ER with concern for ear pain.  Patient states that for the last few days she has been having primarily right ear pain but then she developed some left ear pain.  Described as pressure sensation, also hurts when she yawns.  Denies any tooth pain, drainage.  No fevers or chills.  Reports that she was given antibiotic by urgent care.  Has been taking this for the past day or so.  No improvement in symptoms.  Has tried some over-the-counter pain medicine without alleviation in pain as well.  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  Reviewed recent family medicine virtual visit, diagnosed with acute otitis media and given Rx for doxycycline, Flonase, Motrin.  HPI     Home Medications Prior to Admission medications   Medication Sig Start Date End Date Taking? Authorizing Provider  amitriptyline (ELAVIL) 50 MG tablet Take 1 tablet (50 mg total) by mouth at bedtime. 01/19/21  Yes Penumalli, Earlean Polka, MD  doxycycline (VIBRA-TABS) 100 MG tablet Take 1 tablet (100 mg total) by mouth 2 (two) times daily for 5 days. 06/02/21 06/07/21 Yes Gildardo Pounds, NP  gabapentin (NEURONTIN) 300 MG capsule Take 1-3 capsules (300-900 mg total) by mouth 3 (three) times daily. Take 300mg  qhs x1 week then may increase to 300mg  TID 01/19/21  Yes Penumalli, Earlean Polka, MD  ibuprofen (ADVIL) 600 MG tablet Take 1 tablet (600 mg total) by mouth every 8 (eight) hours as needed. 06/02/21  Yes Gildardo Pounds, NP  olmesartan-hydrochlorothiazide (BENICAR HCT) 40-12.5 MG tablet Take 1 tablet by mouth daily. 01/24/21  Yes Luetta Nutting, DO  Rimegepant Sulfate (NURTEC) 75 MG TBDP Take 75 mg by mouth daily as needed (migraine). 12/27/20  Yes Luetta Nutting, DO   Syringe/Needle, Disp, (SYRINGE 3CC/25GX1-1/2") 25G X 1-1/2" 3 ML MISC Use to inject B12 01/24/21  Yes Luetta Nutting, DO  vitamin B-12 (CYANOCOBALAMIN) 1000 MCG tablet Take 1,000 mcg by mouth daily.   Yes [provider]  Vitamin D, Ergocalciferol, (DRISDOL) 1.25 MG (50000 UNIT) CAPS capsule Take 1 capsule (50,000 Units total) by mouth every 7 (seven) days. 10/13/20  Yes Luetta Nutting, DO  ALPRAZolam Duanne Moron) 0.5 MG tablet for sedation before MRI scan; take 1 tab 1 hour before scan; may repeat 1 tab 15 min before scan 01/19/21   Penumalli, Earlean Polka, MD  dicyclomine (BENTYL) 10 MG capsule Take 1 capsule (10 mg total) by mouth 4 (four) times daily -  before meals and at bedtime. Patient taking differently: Take 10 mg by mouth in the morning and at bedtime. 10/05/20   Luetta Nutting, DO  fluconazole (DIFLUCAN) 150 MG tablet Take one dose by mouth, wait 72 hours, and then take second dose by mouth 04/08/21   Wurst, [place], PA-C  fluticasone (FLONASE) 50 MCG/ACT nasal spray Place 2 sprays into both nostrils daily. Patient not taking: Reported on 06/03/2021 06/02/21   Gildardo Pounds, NP      Allergies    Penicillins    Review of Systems   Review of Systems  HENT:  Positive for ear pain.   All other systems reviewed and are negative.  Physical Exam Updated Vital Signs BP (!) 187/118 (  BP Location: Right Arm)    Pulse 74    Temp 98.1 F (36.7 C) (Oral)    Resp 15    Ht 6' (1.829 m)    Wt 93 kg    SpO2 100%    BMI 27.80 kg/m  Physical Exam Vitals and nursing note reviewed.  Constitutional:      General: She is not in acute distress.    Appearance: She is well-developed.  HENT:     Head: Normocephalic and atraumatic.     Right Ear: Tympanic membrane, ear canal and external ear normal.     Left Ear: Tympanic membrane, ear canal and external ear normal.     Mouth/Throat:     Pharynx: Oropharynx is clear.     Comments: Edentulous lower mouth, no tenderness, swelling, erythema or abscess  appreciated in mouth Eyes:     Conjunctiva/sclera: Conjunctivae normal.  Neck:     Comments: No swelling or tenderness noted to neck, normal neck range of motion Cardiovascular:     Rate and Rhythm: Normal rate.     Pulses: Normal pulses.  Pulmonary:     Effort: Pulmonary effort is normal. No respiratory distress.  Musculoskeletal:        General: No swelling.     Cervical back: Neck supple.  Skin:    General: Skin is warm and dry.     Capillary Refill: Capillary refill takes less than 2 seconds.  Neurological:     General: No focal deficit present.     Mental Status: She is alert.  Psychiatric:        Mood and Affect: Mood normal.    ED Results / Procedures / Treatments   Labs (all labs ordered are listed, but only abnormal results are displayed) Labs Reviewed - No data to display  EKG None  Radiology No results found.  Procedures Procedures    Medications Ordered in ED Medications - No data to display  ED Course/ Medical Decision Making/ A&P                           Medical Decision Making  46 year old lady presents to ER with concern for bilateral ear pain, right ear worse.  Ears appear normal, no tenderness over mastoid process, TMs normal, canal normal.  Based on physical exam, do not feel this is consistent with acute otitis media, otitis externa or mastoiditis.  No neck pain or swelling, oropharynx appears normal.  Given reassuring physical exam, do not feel patient requires any CT imaging of temporal bones or neck.  Etiology for pain not clear at present, would consider possibility of TMJ clinically.  Given her well appearance, afebrile, believe she is appropriate for outpatient management for these concerns.  Recommend continuing NSAIDs as needed, recommended finishing antibiotic in case of infection that has already clearing.  Additionally recommended follow-up with PCP and/or ENT.  Noted hypertension on vital signs, patient does not have any specific symptoms  related to this hypertension, suspect incidental and appropriate for PCP follow-up for further management.  Reviewed return precautions and discharged.        Final Clinical Impression(s) / ED Diagnoses Final diagnoses:  Right ear pain    Rx / DC Orders ED Discharge Orders     None         Lucrezia Starch, MD 06/03/21 [phone]

## 2021-06-18 ENCOUNTER — Other Ambulatory Visit: Payer: Self-pay

## 2021-06-18 ENCOUNTER — Ambulatory Visit (INDEPENDENT_AMBULATORY_CARE_PROVIDER_SITE_OTHER): Payer: 59 | Admitting: Family Medicine

## 2021-06-18 ENCOUNTER — Encounter: Payer: Self-pay | Admitting: Family Medicine

## 2021-06-18 DIAGNOSIS — M26629 Arthralgia of temporomandibular joint, unspecified side: Secondary | ICD-10-CM | POA: Diagnosis not present

## 2021-06-18 MED ORDER — PREDNISONE 10 MG (48) PO TBPK: ORAL_TABLET | ORAL | 0 refills | Status: DC

## 2021-06-18 MED ORDER — CYCLOBENZAPRINE HCL 10 MG PO TABS: 10.0000 mg | ORAL_TABLET | Freq: Three times a day (TID) | ORAL | 1 refills | Status: AC | PRN

## 2021-06-18 NOTE — Assessment & Plan Note (Signed)
Adding course of prednisone as well as Flexeril.  Discussed isometric exercises for the jaw.  If not improving we discussed referral to specialist.  Additional considerations include trigeminal neuralgia however I think TMJ is more likely given her tenderness directly over the TMJ joint at this time.

## 2021-06-18 NOTE — Progress Notes (Signed)
[name] - 46 y.o. female MRN [id number]  Date of birth: 18-Mar-1976  Subjective Chief Complaint  Patient presents with   Temporomandibular Joint Pain    HPI [name] is a 46 year old female here today for hospital follow-up.  She was recently seen in the emergency room due to right jaw pain.  Diagnosed with TMJ pain and prescribed anti-inflammatory.  She continues to have pain despite use of anti-inflammatory.  She has had difficulty chewing and talking due to pain.  Her job requires her to talk on the phone throughout the day as she is requesting FMLA paperwork for this.  She denies any new headaches, vision change, fever or chills.  She does have dentures which she removes each night.  ROS:  A comprehensive ROS was completed and negative except as noted per HPI  Allergies  Allergen Reactions   Penicillins Anaphylaxis, Swelling and Rash    Face and throat swell    Past Medical History:  Diagnosis Date   Adnexal cyst 2013   right   Anemia 2015   Pernicious and iron deficiency. As of 2015/2016 no longer requiring po iron or B12 shots.    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 arthroscopy x 2.    Colitis    Headache(784.0)    migraines   Hydradenitis 2001   axillary, multiple surgical I & Ds/excisions.   Hypertension    Nausea & vomiting 11/28/2014   Obesity (BMI 30.0-34.9)    Pilonidal cyst    Pityriasis rosea    pt also gives hx of seborrheic dermatitis.     Past Surgical History:  Procedure Laterality Date   ABDOMINAL HYSTERECTOMY     CYSTECTOMY     2004 axilla bil   ESOPHAGOGASTRODUODENOSCOPY N/A 11/30/2014   Procedure: ESOPHAGOGASTRODUODENOSCOPY (EGD);  Surgeon: Jerene Bears, MD;  Location: [facility] ENDOSCOPY;  Service: Endoscopy;  Laterality: N/A;   FRACTURE SURGERY     rt arm   GASTRIC BYPASS  2011   gastric sleeve   KNEE SURGERY  2005   left   LAPAROSCOPIC CHOLECYSTECTOMY  2002   MEDIAL PATELLOFEMORAL LIGAMENT REPAIR Left 06/08/2013   Procedure: DIAGNOSTIC  OPERATIVE ARTHROSCOPY, OPEN MEDIAL PATELLA FEMORAL LIGAMENT RECONSTRUCTION;  Surgeon: Meredith Pel, MD;  Location: [facility];  Service: Orthopedics;  Laterality: Left;  with Hamstring autograft   OVARIAN CYST REMOVAL     PARTIAL HYSTERECTOMY     R arm surgery      Social History   Socioeconomic History   Marital status: Single    Spouse name: Not on file   Number of children: Not on file   Years of education: Not on file   Highest education level: Not on file  Occupational History   Occupation: Writer    Employer: [employer]    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   Tobacco Use   Smoking status: Every Day    Packs/day: 0.50    Years: 10.00    Pack years: 5.00    Types: Cigarettes    Start date: 05/20/2013   Smokeless tobacco: Never  Vaping Use   Vaping Use: Some days   Substances: Nicotine, Flavoring  Substance and Sexual Activity   Alcohol use: Yes    Alcohol/week: 1.0 - 2.0 standard drink    Types: 1 - 2 Standard drinks or equivalent per week    Comment: occ   Drug use: Yes    Types: Other-see comments    Comment: CBD rub  Sexual activity: Yes    Partners: Male    Birth control/protection: Surgical  Other Topics Concern   Not on file  Social History Narrative   Not on file   Social Determinants of Health   Financial Resource Strain: Not on file  Food Insecurity: Not on file  Transportation Needs: Not on file  Physical Activity: Not on file  Stress: Not on file  Social Connections: Not on file    Family History  Problem Relation Age of Onset   Lupus Mother    Stroke Mother    Hypertension Mother    Crohn's disease Father    Hypertension Father    Gallbladder disease Father    Cancer Maternal Grandfather        lung   COPD Maternal Grandfather    Emphysema Maternal Grandfather    Lung cancer Maternal Grandmother    Stroke Maternal Grandmother    Coronary artery disease Paternal Grandfather     Health Maintenance   Topic Date Due   PAP SMEAR-Modifier  Never done   COVID-19 Vaccine (4 - Booster for Moderna series) 11/26/2020   COLONOSCOPY (Pts 45-23yrs Insurance coverage will need to be confirmed)  01/24/2022 (Originally 09/09/2020)   TETANUS/TDAP  09/05/2024   INFLUENZA VACCINE  Completed   Hepatitis C Screening  Completed   HIV Screening  Completed   HPV VACCINES  Aged Out     ----------------------------------------------------------------------------------------------------------------------------------------------------------------------------------------------------------------- Physical Exam BP (!) 155/91 (BP Location: Right Arm, Patient Position: Sitting, Cuff Size: Large)    Pulse 70    Ht 5\' 11"  (1.803 m)    Wt 201 lb (91.2 kg)    SpO2 96%    BMI 28.03 kg/m   Physical Exam Constitutional:      Appearance: Normal appearance.  HENT:     Head: Normocephalic and atraumatic.     Right Ear: Tympanic membrane normal.     Left Ear: Tympanic membrane normal.     Mouth/Throat:     Comments: Tenderness to palpation along the right TMJ without any clicking or popping. Cardiovascular:     Rate and Rhythm: Normal rate and regular rhythm.  Musculoskeletal:     Cervical back: Neck supple.  Neurological:     Mental Status: She is alert.  Psychiatric:        Mood and Affect: Mood normal.        Behavior: Behavior normal.    ------------------------------------------------------------------------------------------------------------------------------------------------------------------------------------------------------------------- Assessment and Plan  TMJ syndrome Adding course of prednisone as well as Flexeril.  Discussed isometric exercises for the jaw.  If not improving we discussed referral to specialist.  Additional considerations include trigeminal neuralgia however I think TMJ is more likely given her tenderness directly over the TMJ joint at this time.   Meds ordered this encounter   Medications   predniSONE (STERAPRED UNI-PAK 48 TAB) 10 MG (48) TBPK tablet    Sig: Taper as directed on packaging    Dispense:  48 tablet    Refill:  0   cyclobenzaprine (FLEXERIL) 10 MG tablet    Sig: Take 1 tablet (10 mg total) by mouth 3 (three) times daily as needed for muscle spasms (jaw pain).    Dispense:  30 tablet    Refill:  1    Return in about 2 weeks (around 07/02/2021) for TMJ pain.    This visit occurred during the SARS-CoV-2 public health emergency.  Safety protocols were in place, including screening questions prior to the visit, additional usage of staff PPE, and  extensive cleaning of exam room while observing appropriate contact time as indicated for disinfecting solutions.

## 2021-06-18 NOTE — Patient Instructions (Signed)
Temporomandibular Joint Syndrome Temporomandibular joint syndrome (TMJ syndrome) is a condition that causes pain in the temporomandibular joints. These joints are located near your ears and allow your jaw to open and close. For people with TMJ syndrome, chewing, biting, or other movements of the jaw can be difficult or painful. TMJ syndrome is often mild and goes away within a few weeks. However, sometimes the condition becomes a long-term (chronic) problem. What are the causes? This condition may be caused by: Grinding your teeth or clenching your jaw. Some people do this when they are stressed. Arthritis. An injury to the jaw. A head or neck injury. Teeth or dentures that are not aligned well. In some cases, the cause of TMJ syndrome may not be known. What are the signs or symptoms? The most common symptom of this condition is aching pain on the side of the head in the area of the TMJ. Other symptoms may include: Pain when moving your jaw, such as when chewing or biting. Not being able to open your jaw all the way. Making a clicking sound when you open your mouth. Headache. Earache. Neck or shoulder pain. How is this diagnosed? This condition may be diagnosed based on: Your symptoms and medical history. A physical exam. Your health care provider may check the range of motion of your jaw. Imaging tests, such as X-rays or an MRI. You may also need to see your dentist, who will check if your teeth and jaw are lined up correctly. How is this treated? TMJ syndrome often goes away on its own. If treatment is needed, it may include: Eating soft foods and applying ice or heat. Medicines to relieve pain or inflammation. Medicines or massage to relax the muscles. A splint, bite plate, or mouthpiece to prevent teeth grinding or jaw clenching. Relaxation techniques or counseling to help reduce stress. A therapy for pain in which an electrical current is applied to the nerves through the skin  (transcutaneous electrical nerve stimulation). Acupuncture. This may help to relieve pain. Jaw surgery. This is rarely needed. Follow these instructions at home: Eating and drinking Eat a soft diet if you are having trouble chewing. Avoid foods that require a lot of chewing. Do not chew gum. General instructions Take over-the-counter and prescription medicines only as told by your health care provider. If directed, put ice on the painful area. To do this: Put ice in a plastic bag. Place a towel between your skin and the bag. Leave the ice on for 20 minutes, 2-3 times a day. Remove the ice if your skin turns bright red. This is very important. If you cannot feel pain, heat, or cold, you have a greater risk of damage to the area. Apply a warm, wet cloth (warm compress) to the painful area as told. Massage your jaw area and do any jaw stretching exercises as told by your health care provider. If you were given a splint, bite plate, or mouthpiece, wear it as told by your health care provider. Keep all follow-up visits. This is important. Where to find more information [place]: [email or web address]/ Contact a health care provider if: You have trouble eating. You have new or worsening symptoms. Get help right away if: Your jaw locks. Summary Temporomandibular joint syndrome (TMJ syndrome) is a condition that causes pain in the temporomandibular joints. These joints are located near your ears and allow your jaw to open and close. TMJ syndrome is often mild and goes away within a few  weeks. However, sometimes the condition becomes a long-term (chronic) problem. Symptoms include an aching pain on the side of the head in the area of the TMJ, pain when chewing or biting, and being unable to open your jaw all the way. You may also make a clicking sound when you open your mouth. TMJ syndrome often goes away on its own. If treatment is needed, it may include  medicines to relieve pain, reduce inflammation, or relax the muscles. A splint, bite plate, or mouthpiece may also be used to prevent teeth grinding or jaw clenching. This information is not intended to replace advice given to you by your health care provider. Make sure you discuss any questions you have with your health care provider. Document Revised: 12/17/2020 Document Reviewed: 12/17/2020 Elsevier Patient Education  [place].

## 2021-06-25 ENCOUNTER — Telehealth: Payer: Self-pay

## 2021-06-25 NOTE — Telephone Encounter (Signed)
Faxed completed FMLA documenation to [employer] @ [phone] (with confirmation) on 06/22/21.

## 2021-07-02 ENCOUNTER — Ambulatory Visit: Payer: 59 | Admitting: Family Medicine

## 2021-07-03 ENCOUNTER — Ambulatory Visit (INDEPENDENT_AMBULATORY_CARE_PROVIDER_SITE_OTHER): Payer: 59 | Admitting: Family Medicine

## 2021-07-03 ENCOUNTER — Other Ambulatory Visit: Payer: Self-pay

## 2021-07-03 ENCOUNTER — Encounter: Payer: Self-pay | Admitting: Family Medicine

## 2021-07-03 VITALS — BP 146/96 | HR 94 | Ht 71.0 in | Wt 197.0 lb

## 2021-07-03 DIAGNOSIS — R519 Headache, unspecified: Secondary | ICD-10-CM | POA: Insufficient documentation

## 2021-07-03 MED ORDER — CARBAMAZEPINE ER 200 MG PO TB12: 200.0000 mg | ORAL_TABLET | Freq: Two times a day (BID) | ORAL | Status: AC

## 2021-07-03 NOTE — Progress Notes (Signed)
[name] - 46 y.o. female MRN [id number]  Date of birth: 10/07/1975  Subjective No chief complaint on file.   HPI [name] is a 46 year old female here today for follow-up of jaw and facial pain.  Prednisone and Flexeril added previously.  Thought she was having some improvement however symptoms have now worsened again.  Describes pain as stabbing sensation as well as throbbing sensation.  Pain has evolved to include the maxillary area as well as the jaw.  She has not had any fever or rash.  Pain is worsened by talking or eating.  ROS:  A comprehensive ROS was completed and negative except as noted per HPI  Allergies  Allergen Reactions   Penicillins Anaphylaxis, Swelling and Rash    Face and throat swell    Past Medical History:  Diagnosis Date   Adnexal cyst 2013   right   Anemia 2015   Pernicious and iron deficiency. As of 2015/2016 no longer requiring po iron or B12 shots.    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 arthroscopy x 2.    Colitis    Headache(784.0)    migraines   Hydradenitis 2001   axillary, multiple surgical I & Ds/excisions.   Hypertension    Nausea & vomiting 11/28/2014   Obesity (BMI 30.0-34.9)    Pilonidal cyst    Pityriasis rosea    pt also gives hx of seborrheic dermatitis.     Past Surgical History:  Procedure Laterality Date   ABDOMINAL HYSTERECTOMY     CYSTECTOMY     2004 axilla bil   ESOPHAGOGASTRODUODENOSCOPY N/A 11/30/2014   Procedure: ESOPHAGOGASTRODUODENOSCOPY (EGD);  Surgeon: Jerene Bears, MD;  Location: [facility] ENDOSCOPY;  Service: Endoscopy;  Laterality: N/A;   FRACTURE SURGERY     rt arm   GASTRIC BYPASS  2011   gastric sleeve   KNEE SURGERY  2005   left   LAPAROSCOPIC CHOLECYSTECTOMY  2002   MEDIAL PATELLOFEMORAL LIGAMENT REPAIR Left 06/08/2013   Procedure: DIAGNOSTIC OPERATIVE ARTHROSCOPY, OPEN MEDIAL PATELLA FEMORAL LIGAMENT RECONSTRUCTION;  Surgeon: Meredith Pel, MD;  Location: [facility];  Service: Orthopedics;  Laterality: Left;   with Hamstring autograft   OVARIAN CYST REMOVAL     PARTIAL HYSTERECTOMY     R arm surgery      Social History   Socioeconomic History   Marital status: Single    Spouse name: Not on file   Number of children: Not on file   Years of education: Not on file   Highest education level: Not on file  Occupational History   Occupation: Writer    Employer: [employer]    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   Tobacco Use   Smoking status: Every Day    Packs/day: 0.50    Years: 10.00    Pack years: 5.00    Types: Cigarettes    Start date: 05/20/2013   Smokeless tobacco: Never  Vaping Use   Vaping Use: Some days   Substances: Nicotine, Flavoring  Substance and Sexual Activity   Alcohol use: Yes    Alcohol/week: 1.0 - 2.0 standard drink    Types: 1 - 2 Standard drinks or equivalent per week    Comment: occ   Drug use: Yes    Types: Other-see comments    Comment: CBD rub   Sexual activity: Yes    Partners: Male    Birth control/protection: Surgical  Other Topics Concern   Not on file  Social History Narrative  Not on file   Social Determinants of Health   Financial Resource Strain: Not on file  Food Insecurity: Not on file  Transportation Needs: Not on file  Physical Activity: Not on file  Stress: Not on file  Social Connections: Not on file    Family History  Problem Relation Age of Onset   Lupus Mother    Stroke Mother    Hypertension Mother    Crohn's disease Father    Hypertension Father    Gallbladder disease Father    Cancer Maternal Grandfather        lung   COPD Maternal Grandfather    Emphysema Maternal Grandfather    Lung cancer Maternal Grandmother    Stroke Maternal Grandmother    Coronary artery disease Paternal Grandfather     Health Maintenance  Topic Date Due   PAP SMEAR-Modifier  Never done   COVID-19 Vaccine (4 - Booster for Moderna series) 11/26/2020   COLONOSCOPY (Pts 45-25yrs Insurance coverage will need  to be confirmed)  01/24/2022 (Originally 09/09/2020)   TETANUS/TDAP  09/05/2024   INFLUENZA VACCINE  Completed   Hepatitis C Screening  Completed   HIV Screening  Completed   HPV VACCINES  Aged Out     ----------------------------------------------------------------------------------------------------------------------------------------------------------------------------------------------------------------- Physical Exam BP (!) 146/96 (BP Location: Left Arm, Patient Position: Sitting, Cuff Size: Large)    Pulse 94    Ht 5\' 11"  (1.803 m)    Wt 197 lb (89.4 kg)    SpO2 100%    BMI 27.48 kg/m   Physical Exam Constitutional:      Appearance: Normal appearance.  Eyes:     General: No scleral icterus. Cardiovascular:     Rate and Rhythm: Normal rate and regular rhythm.  Pulmonary:     Effort: Pulmonary effort is normal.     Breath sounds: Normal breath sounds.  Musculoskeletal:     Cervical back: Neck supple.  Skin:    General: Skin is warm and dry.     Findings: No rash.  Neurological:     Mental Status: She is alert.     Comments: She does have some change in sensation in the right trigeminal distribution compared to the left side of her face.    ------------------------------------------------------------------------------------------------------------------------------------------------------------------------------------------------------------------- Assessment and Plan  Facial pain Initially thought to be TMJ however she has not responded to traditional measures for management of TMJ symptoms.  Symptoms have evolved since initial onset to include distribution of trigeminal nerve.  Symptoms concerning for trigeminal neuralgia.  Adding trial of carbamazepine to replace gabapentin.  MRI with contrast and MRA of the brain ordered to evaluate for underlying causes of neuralgia including nerve entrapment or demyelinating lesions.   Meds ordered this encounter  Medications    carbamazepine (TEGRETOL-XR) 200 MG 12 hr tablet    Sig: Take 1 tablet (200 mg total) by mouth 2 (two) times daily.    Dispense:  60 tablet    Refill:  01    Return in about 4 weeks (around 07/31/2021) for Facial pain.    This visit occurred during the SARS-CoV-2 public health emergency.  Safety protocols were in place, including screening questions prior to the visit, additional usage of staff PPE, and extensive cleaning of exam room while observing appropriate contact time as indicated for disinfecting solutions.

## 2021-07-03 NOTE — Patient Instructions (Addendum)
Start carbamazepine 200mg  twice per day.  Discontinue gabapentin.  I have ordered an MRI for further evaluation   Trigeminal Neuralgia Trigeminal neuralgia is a nerve disorder that causes severe pain on one side of the face. The pain may last from a few seconds to several minutes, but it can happen hundreds of times a day. The pain is usually only on one side of the face. Symptoms may occur for days, weeks, or months and then go away for months or years. The pain may return and be worse than before. What are the causes? This condition may be caused by: Damage or pressure to a nerve in the head that is called the trigeminal nerve. An attack can be triggered by: Talking or chewing. Putting on makeup. Washing, shaving, or touching your face. Brushing your teeth. Blasts of hot or cold air. Primary demyelinating disorders, such as multiple sclerosis. Tumors. What increases the risk? You are more likely to develop this condition if: You are 39-18 years old. You are female. What are the signs or symptoms? The main symptom of this condition is severe pain in the jaw, lips, eyes, nose, scalp, forehead, and face. How is this diagnosed? This condition is diagnosed with a physical exam. A CT scan or an MRI may be done to rule out other conditions that can cause facial pain. How is this treated? This condition may be treated with: Measures to avoid the things that trigger your symptoms. Prescription medicines such as anticonvulsants. Procedures such as ablation, thermal, or radiation therapy. Cognitive or behavioral therapy. Complementary therapies such as: Gentle, regular exercise or yoga. Meditation. Aromatherapy. Acupuncture. Surgery. This may be done in severe cases if other medical treatment does not provide relief. It may take up to one month for treatment to start relieving the pain. Follow these instructions at home: Managing pain  Learn as much as you can about how to manage  your pain. Ask your health care provider if a pain specialist would be helpful. Consider talking with a mental health care provider about how to cope with the pain. Consider joining a pain support group. General instructions Take over-the-counter and prescription medicines only as told by your health care provider. Avoid the things that trigger your symptoms. It may help to: Chew on the unaffected side of your mouth. Avoid touching your face. Avoid blasts of hot or cold air. Keep all follow-up visits. Where to find more information Facial Pain Association: facepain.org Contact a health care provider if: Your medicine is not helping your symptoms. You have side effects from the medicine used for treatment. You develop new, unexplained symptoms, such as: Double vision. Facial weakness or numbness. Changes in hearing or balance. You feel depressed. Get help right away if: Your pain is severe and is not getting better. You develop suicidal thoughts. If you ever feel like you may hurt yourself or others, or have thoughts about taking your own life, get help right away. Go to your nearest emergency department or: Call your local emergency services (911 in the [place] Call a suicide crisis helpline, such as the [facility] at 9030311846 or 988 in the [place]. This is open 24 hours a day in the [place] Text the Crisis Text Line at 331-880-2692 (in the [place].). Summary Trigeminal neuralgia is a nerve disorder that causes severe pain on one side of the face. The pain may last from a few seconds to several minutes. This condition is caused by damage or pressure to a nerve  in the head that is called the trigeminal nerve. Treatment may include avoiding the things that trigger your symptoms, taking medicines, or having procedures or surgery. It may take up to one month for treatment to start relieving the pain. Keep all follow-up visits. This information is not intended to replace  advice given to you by your health care provider. Make sure you discuss any questions you have with your health care provider. Document Revised: 11/30/2020 Document Reviewed: 10/30/2020 Elsevier Patient Education  [place].

## 2021-07-03 NOTE — Assessment & Plan Note (Addendum)
Initially thought to be TMJ however she has not responded to traditional measures for management of TMJ symptoms.  Symptoms have evolved since initial onset to include distribution of trigeminal nerve.  Symptoms concerning for trigeminal neuralgia.  Adding trial of carbamazepine to replace gabapentin.  MRI with contrast and MRA of the brain ordered to evaluate for underlying causes of neuralgia including nerve entrapment or demyelinating lesions.

## 2021-07-05 ENCOUNTER — Encounter: Payer: Self-pay | Admitting: Family Medicine

## 2021-07-06 NOTE — Telephone Encounter (Signed)
I have scanned paperwork into patient's chart, sent via MyChart message to patient and also emailed the paperwork to patient as well. AM

## 2021-07-16 ENCOUNTER — Other Ambulatory Visit: Payer: Self-pay | Admitting: Family Medicine

## 2021-07-16 MED ORDER — TRIAZOLAM 0.25 MG PO TABS: 0.2500 mg | ORAL_TABLET | Freq: Once | ORAL | 0 refills | Status: DC

## 2021-07-17 ENCOUNTER — Ambulatory Visit: Payer: Managed Care, Other (non HMO)

## 2021-07-17 ENCOUNTER — Ambulatory Visit (INDEPENDENT_AMBULATORY_CARE_PROVIDER_SITE_OTHER): Payer: Managed Care, Other (non HMO)

## 2021-07-17 ENCOUNTER — Other Ambulatory Visit: Payer: Self-pay

## 2021-07-17 DIAGNOSIS — R519 Headache, unspecified: Secondary | ICD-10-CM | POA: Diagnosis not present

## 2021-07-17 DIAGNOSIS — R6884 Jaw pain: Secondary | ICD-10-CM

## 2021-07-17 MED ORDER — GADOBUTROL 1 MMOL/ML IV SOLN
9.0000 mL | Freq: Once | INTRAVENOUS | Status: AC | PRN
  Administered 2021-07-17: 9 mL via INTRAVENOUS

## 2021-07-18 NOTE — Progress Notes (Signed)
MRI already done

## 2021-07-19 ENCOUNTER — Other Ambulatory Visit: Payer: Self-pay | Admitting: Family Medicine

## 2021-07-19 ENCOUNTER — Encounter: Payer: Self-pay | Admitting: Family Medicine

## 2021-07-19 DIAGNOSIS — R519 Headache, unspecified: Secondary | ICD-10-CM

## 2021-07-19 DIAGNOSIS — D333 Benign neoplasm of cranial nerves: Secondary | ICD-10-CM

## 2021-07-19 MED ORDER — TRIAZOLAM 0.25 MG PO TABS: 0.2500 mg | ORAL_TABLET | Freq: Once | ORAL | 0 refills | Status: DC

## 2021-07-24 ENCOUNTER — Other Ambulatory Visit: Payer: Self-pay | Admitting: Family Medicine

## 2021-07-24 DIAGNOSIS — D333 Benign neoplasm of cranial nerves: Secondary | ICD-10-CM

## 2021-07-24 DIAGNOSIS — R519 Headache, unspecified: Secondary | ICD-10-CM

## 2021-07-25 ENCOUNTER — Ambulatory Visit: Payer: 59 | Admitting: Family Medicine

## 2021-07-27 ENCOUNTER — Other Ambulatory Visit: Payer: Self-pay

## 2021-07-27 ENCOUNTER — Ambulatory Visit (HOSPITAL_BASED_OUTPATIENT_CLINIC_OR_DEPARTMENT_OTHER)
Admission: RE | Admit: 2021-07-27 | Discharge: 2021-07-27 | Disposition: A | Discharge status: Home / Self Care | Payer: 59 | Source: Ambulatory Visit | Attending: Family Medicine | Admitting: Family Medicine

## 2021-07-27 DIAGNOSIS — R519 Headache, unspecified: Secondary | ICD-10-CM | POA: Insufficient documentation

## 2021-07-27 DIAGNOSIS — D333 Benign neoplasm of cranial nerves: Secondary | ICD-10-CM | POA: Insufficient documentation

## 2021-07-27 DIAGNOSIS — G501 Atypical facial pain: Secondary | ICD-10-CM | POA: Diagnosis not present

## 2021-07-27 MED ORDER — GADOBUTROL 1 MMOL/ML IV SOLN
9.0000 mL | Freq: Once | INTRAVENOUS | Status: AC | PRN
  Administered 2021-07-27: 9 mL via INTRAVENOUS
  Filled 2021-07-27: qty 10

## 2021-07-31 ENCOUNTER — Ambulatory Visit (INDEPENDENT_AMBULATORY_CARE_PROVIDER_SITE_OTHER): Payer: 59 | Admitting: Family Medicine

## 2021-07-31 ENCOUNTER — Encounter: Payer: Self-pay | Admitting: Family Medicine

## 2021-07-31 ENCOUNTER — Other Ambulatory Visit: Payer: Self-pay

## 2021-07-31 DIAGNOSIS — R519 Headache, unspecified: Secondary | ICD-10-CM

## 2021-07-31 DIAGNOSIS — I10 Essential (primary) hypertension: Secondary | ICD-10-CM | POA: Diagnosis not present

## 2021-07-31 DIAGNOSIS — G4700 Insomnia, unspecified: Secondary | ICD-10-CM | POA: Insufficient documentation

## 2021-07-31 DIAGNOSIS — F5101 Primary insomnia: Secondary | ICD-10-CM | POA: Diagnosis not present

## 2021-07-31 DIAGNOSIS — R69 Illness, unspecified: Secondary | ICD-10-CM | POA: Diagnosis not present

## 2021-07-31 MED ORDER — TEMAZEPAM 15 MG PO CAPS: 15.0000 mg | ORAL_CAPSULE | Freq: Every evening | ORAL | 0 refills | Status: DC | PRN

## 2021-07-31 MED ORDER — ESCITALOPRAM OXALATE 10 MG PO TABS: ORAL_TABLET | ORAL | 1 refills | Status: DC

## 2021-07-31 MED ORDER — OLMESARTAN MEDOXOMIL-HCTZ 40-12.5 MG PO TABS: 2.0000 | ORAL_TABLET | Freq: Every day | ORAL | 1 refills | Status: AC

## 2021-07-31 NOTE — Assessment & Plan Note (Signed)
Continues to have facial pain consistent with trigeminal neuralgia.  This has improved some with carbamazepine.  Vestibular schwannoma noted on recent MRI has upcoming visit with neurosurgery. ?

## 2021-07-31 NOTE — Patient Instructions (Addendum)
Let's add lexapro '5mg'$  for the first week then increase to '10mg'$ .  ?Try restoril as needed for sleep.  Try to limit use of this.  ?Increase olmesartan/hctz to 2 tablets daily for blood pressure ?See me again in about 6 weeks.  ? ?

## 2021-07-31 NOTE — Assessment & Plan Note (Signed)
Trazodone is been effective for her.  She has tried other medications in the past to help with sleep.  Adding Restoril as needed.  We discussed trying to limit use of this to avoid dependence and tolerance.   ?

## 2021-07-31 NOTE — Assessment & Plan Note (Signed)
Blood pressure remains elevated.  Increasing Benicar HCTZ to 2 tabs daily. ?

## 2021-07-31 NOTE — Progress Notes (Signed)
?[name] - 47 y.o. female MRN [id number]  Date of birth: Jan 25, 1976 ? ?Subjective ?Chief Complaint  ?Patient presents with  ? Hypertension  ? Follow-up  ? ? ?HPI ?[name] is a 46 year old female here today for follow-up visit.  Since last visit she had MRI which shows vestibular schwannoma.  She has been referred to ENT however she has appointment with neurosurgery instead.  Reports that her insurance company contacted her and placed this referral.  This is scheduled for later this week.  She does report the pain is better controlled since adding carbamazepine.  She does continue to have some migraines.  She is having difficulty with sleep.  She did find Halcion for her MRI to be very helpful for her.  She would like to try another medication for sleep as trazodone has not been effective for her. ? ?Blood pressure remains elevated.  She is taking Benicar HCT as directed.   ? ?ROS:  A comprehensive ROS was completed and negative except as noted per HPI ? ?Allergies  ?Allergen Reactions  ? Penicillins Anaphylaxis, Swelling and Rash  ?  Face and throat swell  ? ? ?Past Medical History:  ?Diagnosis Date  ? Adnexal cyst 2013  ? right  ? Anemia 2015  ? Pernicious and iron deficiency. As of 2015/2016 no longer requiring po iron or B12 shots.   ? Chondromalacia 2005  ? left knee. arthroscopy x 2.   ? Colitis   ? Headache(784.0)   ? migraines  ? Hydradenitis 2001  ? axillary, multiple surgical I & Ds/excisions.  ? Hypertension   ? Nausea & vomiting 11/28/2014  ? Obesity (BMI 30.0-34.9)   ? Pilonidal cyst   ? Pityriasis rosea   ? pt also gives hx of seborrheic dermatitis.   ? ? ?Past Surgical History:  ?Procedure Laterality Date  ? ABDOMINAL HYSTERECTOMY    ? CYSTECTOMY    ? 2004 axilla bil  ? ESOPHAGOGASTRODUODENOSCOPY N/A 11/30/2014  ? Procedure: ESOPHAGOGASTRODUODENOSCOPY (EGD);  Surgeon: Jerene Bears, MD;  Location: [facility] ENDOSCOPY;  Service: Endoscopy;  Laterality: N/A;  ? FRACTURE SURGERY    ? rt arm  ? GASTRIC  BYPASS  2011  ? gastric sleeve  ? KNEE SURGERY  2005  ? left  ? LAPAROSCOPIC CHOLECYSTECTOMY  2002  ? MEDIAL PATELLOFEMORAL LIGAMENT REPAIR Left 06/08/2013  ? Procedure: DIAGNOSTIC OPERATIVE ARTHROSCOPY, OPEN MEDIAL PATELLA FEMORAL LIGAMENT RECONSTRUCTION;  Surgeon: Meredith Pel, MD;  Location: [facility];  Service: Orthopedics;  Laterality: Left;  with Hamstring autograft  ? OVARIAN CYST REMOVAL    ? PARTIAL HYSTERECTOMY    ? R arm surgery    ? ? ?Social History  ? ?Socioeconomic History  ? Marital status: Single  ?  Spouse name: Not on file  ? Number of children: Not on file  ? Years of education: Not on file  ? Highest education level: Not on file  ?Occupational History  ? Occupation: Writer  ?  Employer: [employer]  ?  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   ?Tobacco Use  ? Smoking status: Every Day  ?  Packs/day: 0.50  ?  Years: 10.00  ?  Pack years: 5.00  ?  Types: Cigarettes  ?  Start date: 05/20/2013  ? Smokeless tobacco: Never  ?Vaping Use  ? Vaping Use: Some days  ? Substances: Nicotine, Flavoring  ?Substance and Sexual Activity  ? Alcohol use: Yes  ?  Alcohol/week: 1.0 - 2.0 standard drink  ?  Types: 1 - 2 Standard drinks or equivalent per week  ?  Comment: occ  ? Drug use: Yes  ?  Types: Other-see comments  ?  Comment: CBD rub  ? Sexual activity: Yes  ?  Partners: Male  ?  Birth control/protection: Surgical  ?Other Topics Concern  ? Not on file  ?Social History Narrative  ? Not on file  ? ?Social Determinants of Health  ? ?Financial Resource Strain: Not on file  ?Food Insecurity: Not on file  ?Transportation Needs: Not on file  ?Physical Activity: Not on file  ?Stress: Not on file  ?Social Connections: Not on file  ? ? ?Family History  ?Problem Relation Age of Onset  ? Lupus Mother   ? Stroke Mother   ? Hypertension Mother   ? Crohn's disease Father   ? Hypertension Father   ? Gallbladder disease Father   ? Cancer Maternal Grandfather   ?     lung  ? COPD Maternal Grandfather    ? Emphysema Maternal Grandfather   ? Lung cancer Maternal Grandmother   ? Stroke Maternal Grandmother   ? Coronary artery disease Paternal Grandfather   ? ? ?Health Maintenance  ?Topic Date Due  ? PAP SMEAR-Modifier  Never done  ? COVID-19 Vaccine (4 - Booster for Moderna series) 11/26/2020  ? COLONOSCOPY (Pts 45-92yr Insurance coverage will need to be confirmed)  01/24/2022 (Originally 09/09/2020)  ? TETANUS/TDAP  09/05/2024  ? INFLUENZA VACCINE  Completed  ? Hepatitis C Screening  Completed  ? HIV Screening  Completed  ? HPV VACCINES  Aged Out  ? ? ? ?----------------------------------------------------------------------------------------------------------------------------------------------------------------------------------------------------------------- ?Physical Exam ?BP (!) 165/110 (BP Location: Left Arm, Patient Position: Sitting, Cuff Size: Large)   Pulse 98   Ht '5\' 11"'$  (1.803 m)   Wt 206 lb 8 oz (93.7 kg)   SpO2 97%   BMI 28.80 kg/m?  ? ?Physical Exam ?Constitutional:   ?   Appearance: Normal appearance.  ?Cardiovascular:  ?   Rate and Rhythm: Normal rate and regular rhythm.  ?Pulmonary:  ?   Effort: Pulmonary effort is normal.  ?   Breath sounds: Normal breath sounds.  ?Musculoskeletal:  ?   Cervical back: Neck supple.  ?Neurological:  ?   Mental Status: She is alert.  ?Psychiatric:     ?   Mood and Affect: Mood normal.     ?   Behavior: Behavior normal.  ? ? ?------------------------------------------------------------------------------------------------------------------------------------------------------------------------------------------------------------------- ?Assessment and Plan ? ?HTN (hypertension) ?Blood pressure remains elevated.  Increasing Benicar HCTZ to 2 tabs daily. ? ?Facial pain ?Continues to have facial pain consistent with trigeminal neuralgia.  This has improved some with carbamazepine.  Vestibular schwannoma noted on recent MRI has upcoming visit with  neurosurgery. ? ?Insomnia ?Trazodone is been effective for her.  She has tried other medications in the past to help with sleep.  Adding Restoril as needed.  We discussed trying to limit use of this to avoid dependence and tolerance.   ? ? ?Meds ordered this encounter  ?Medications  ? temazepam (RESTORIL) 15 MG capsule  ?  Sig: Take 1 capsule (15 mg total) by mouth at bedtime as needed for sleep.  ?  Dispense:  20 capsule  ?  Refill:  0  ? escitalopram (LEXAPRO) 10 MG tablet  ?  Sig: Take '5mg'$  x1 week then increase to '10mg'$  daily.  ?  Dispense:  90 tablet  ?  Refill:  1  ? olmesartan-hydrochlorothiazide (BENICAR HCT) 40-12.5 MG tablet  ?  Sig: Take 2 tablets by mouth daily.  ?  Dispense:  180 tablet  ?  Refill:  1  ? ? ?Return in about 6 weeks (around 09/11/2021) for anxiety. ? ? ? ?This visit occurred during the SARS-CoV-2 public health emergency.  Safety protocols were in place, including screening questions prior to the visit, additional usage of staff PPE, and extensive cleaning of exam room while observing appropriate contact time as indicated for disinfecting solutions.  ? ?

## 2021-08-02 DIAGNOSIS — R030 Elevated blood-pressure reading, without diagnosis of hypertension: Secondary | ICD-10-CM | POA: Diagnosis not present

## 2021-08-02 DIAGNOSIS — D333 Benign neoplasm of cranial nerves: Secondary | ICD-10-CM | POA: Diagnosis not present

## 2021-08-02 DIAGNOSIS — I10 Essential (primary) hypertension: Secondary | ICD-10-CM | POA: Diagnosis not present

## 2021-08-09 ENCOUNTER — Other Ambulatory Visit: Payer: Self-pay

## 2021-08-09 MED ORDER — VITAMIN D (ERGOCALCIFEROL) 1.25 MG (50000 UNIT) PO CAPS: 50000.0000 [IU] | ORAL_CAPSULE | ORAL | 0 refills | Status: DC

## 2021-08-16 ENCOUNTER — Ambulatory Visit (HOSPITAL_BASED_OUTPATIENT_CLINIC_OR_DEPARTMENT_OTHER): Payer: 59 | Admitting: Nurse Practitioner

## 2021-08-16 ENCOUNTER — Encounter (HOSPITAL_BASED_OUTPATIENT_CLINIC_OR_DEPARTMENT_OTHER): Payer: Self-pay | Admitting: Nurse Practitioner

## 2021-08-16 VITALS — BP 128/82 | HR 76 | Temp 98.6°F | Ht 72.0 in | Wt 211.4 lb

## 2021-08-16 DIAGNOSIS — G43011 Migraine without aura, intractable, with status migrainosus: Secondary | ICD-10-CM

## 2021-08-16 DIAGNOSIS — G6289 Other specified polyneuropathies: Secondary | ICD-10-CM | POA: Diagnosis not present

## 2021-08-16 DIAGNOSIS — I10 Essential (primary) hypertension: Secondary | ICD-10-CM | POA: Diagnosis not present

## 2021-08-16 DIAGNOSIS — G4709 Other insomnia: Secondary | ICD-10-CM | POA: Diagnosis not present

## 2021-08-16 DIAGNOSIS — R519 Headache, unspecified: Secondary | ICD-10-CM

## 2021-08-16 DIAGNOSIS — H539 Unspecified visual disturbance: Secondary | ICD-10-CM | POA: Diagnosis not present

## 2021-08-16 DIAGNOSIS — E278 Other specified disorders of adrenal gland: Secondary | ICD-10-CM

## 2021-08-16 DIAGNOSIS — D333 Benign neoplasm of cranial nerves: Secondary | ICD-10-CM | POA: Diagnosis not present

## 2021-08-16 MED ORDER — ZOLPIDEM TARTRATE 5 MG PO TABS: 5.0000 mg | ORAL_TABLET | Freq: Every evening | ORAL | 1 refills | Status: DC | PRN

## 2021-08-16 NOTE — Assessment & Plan Note (Addendum)
Etiology unclear at this time- possibly related to nerve involvement with mass in brain. Given visual changes, recommend avoid driving, specifically for long distances and night. 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 Recommend she remain out of work until further work-up and evaluation can be performed for the vestibular schwannoma and treatment options determined.  ?At this time, driving for employment would pose a danger to patient and others on the roadway.  ? ?

## 2021-08-16 NOTE — Assessment & Plan Note (Addendum)
Chronic migraines, visual changes, and neuropathic symptoms present in the setting of vestibular schwannoma detected on recent MRI.  ?Patient is currently under the care of neurosurgery and is awaiting evaluation from ENT (appt 04/13). No clear plan in place at this time. Concerns that the schwannoma are contributing to her symptoms which are worsening in severity.  ?FMLA paperwork completed today to allow for patient to remain out of work through 02/16/2022 to allow time for evaluation and plan to be determined. Will likely need additional time based on findings, but will plan for these in shorter segments in the event resolution takes place earlier than expected.  ?Continue to monitor for new or worsening symptoms and report immediately.  ?Will plan to f/u in 1 week.  ? ?

## 2021-08-16 NOTE — Assessment & Plan Note (Signed)
Chronic. No improvement with medications and sleep hygiene changes. In the setting of chronic migraine and neurological symptoms, lack of sleep may prove to worsen her symptoms. Discussed options with patient that may be helpful for sleep. We will trial ambien, as she has taken this historically without any concerning effects. Will plan to f/i in 1 week to determine if this has been effective for her.  ?

## 2021-08-16 NOTE — Progress Notes (Signed)
?Tollie Eth, DNP, AGNP-c ?Primary Care & Sports Medicine ?[address]  [address] ?[place], [place] [address] ?[phone] ? ?New patient visit ? ? ?Patient: [name]   DOB: Jul 27, 1975   45 y.o. Female  MRN: [id number] ?Visit Date: 08/16/2021 ? ?Patient Care Team: ?Jayson Waterhouse, Sung Amabile, NP as PCP - General (Nurse Practitioner) ? ?Today's healthcare provider: Tollie Eth, NP  ? ?Chief Complaint  ?Patient presents with  ? Establish Care  ? Headache  ? ?Subjective  ?  ?[name] is a 46 y.o. female who presents today as a new patient to establish care.  ?  ?Patient endorses the following concerns presently: ?Multiple health concerns.  ? ?Brain Mass ?She tells me that she woke up one day in 2020 and she could not see clearly. She started having neuropathy in her toe, started falling, felt imbalanced, and was unable to drive safely.  ?She endorses intermittent loss of color vision and clarity in vision. She reports that shadows cause increased visual disturbance and she is unable to see at night. ?She also endorses olfactory changes and smells "burning smells". This comes and goes.  ?She has numbness in her LE bilaterally, which is worsening and working up her legs. She reports frequent falls and difficulty with balance due to the numbness.  ?She reports constant dizziness and severe headache.  ?Extensive work up revealed presence of a brain mass about one month ago.  ?She has seen Dr. Dutch Quint for neurology.  ? ?Nerve study for neuropathy ?Started in 2020 in the big toe right foot, it has slowly increased over time. Starting in right foot and migrated up the foot and then into the left foot over time. Now she feels the pain all the way into her knees bilaterally.  ?Has started in both hands and about 1/2 way up the arms bilaterally. ?She tells me she feels like she has "icy hot all over the body" that started all over her body intermittently that started last week.  ?She tells me that  she would be walking and fall when the neuropathy started. Her last major fall was in December. She does walk with a cane.  ? ?She also reports breaking out in hives for no known reason intermittently.  ?She has difficulty with sleeping and is usually unable to fall asleep until 3 or 4 am and sleeps more in the daytime.  ? ?She also has a history positive for stroke  ?History reviewed and reveals the following: ?Past Medical History:  ?Diagnosis Date  ? Adnexal cyst 2013  ? right  ? Anemia 2015  ? Pernicious and iron deficiency. As of 2015/2016 no longer requiring po iron or B12 shots.   ? Anxiety 2020  ? Anxiety and ptsd  ? Chondromalacia 2005  ? left knee. arthroscopy x 2.   ? Colitis   ? COPD (chronic obstructive pulmonary disease) (HCC) 2018  ? Headache(784.0)   ? migraines  ? Hydradenitis 2001  ? axillary, multiple surgical I & Ds/excisions.  ? Hypertension   ? Nausea & vomiting 11/28/2014  ? Neuromuscular disorder (HCC) 2020  ? Peripheral neuropathy  ? Obesity (BMI 30.0-34.9)   ? Pilonidal cyst   ? Pityriasis rosea   ? pt also gives hx of seborrheic dermatitis.   ? ?Past Surgical History:  ?Procedure Laterality Date  ? ABDOMINAL HYSTERECTOMY    ? CYSTECTOMY    ? 2004 axilla bil  ? ESOPHAGOGASTRODUODENOSCOPY N/A 11/30/2014  ? Procedure: ESOPHAGOGASTRODUODENOSCOPY (  EGD);  Surgeon: Beverley Fiedler, MD;  Location: [facility] ENDOSCOPY;  Service: Endoscopy;  Laterality: N/A;  ? FRACTURE SURGERY    ? rt arm  ? GASTRIC BYPASS  05/20/2009  ? gastric sleeve  ? KNEE SURGERY  05/21/2003  ? left  ? LAPAROSCOPIC CHOLECYSTECTOMY  05/20/2000  ? MEDIAL PATELLOFEMORAL LIGAMENT REPAIR Left 06/08/2013  ? Procedure: DIAGNOSTIC OPERATIVE ARTHROSCOPY, OPEN MEDIAL PATELLA FEMORAL LIGAMENT RECONSTRUCTION;  Surgeon: Cammy Copa, MD;  Location: [facility] OR;  Service: Orthopedics;  Laterality: Left;  with Hamstring autograft  ? OVARIAN CYST REMOVAL    ? PARTIAL HYSTERECTOMY    ? R arm surgery    ? ?Family Status  ?Relation Name Status  ? Mother  Silvio Pate Deceased at age 67  ? Father Enid Baas  ? MGF Ivar Drape (Not Specified)  ? MGM Louise (Not Specified)  ? PGF John (Not Specified)  ? PGM Essie (Not Specified)  ? ?Family History  ?Problem Relation Age of Onset  ? Lupus Mother   ? Stroke Mother   ? Hypertension Mother   ? Arthritis Mother   ? Crohn's disease Father   ? Hypertension Father   ? Gallbladder disease Father   ? Cancer Maternal Grandfather   ?     lung  ? COPD Maternal Grandfather   ? Emphysema Maternal Grandfather   ? Lung cancer Maternal Grandmother   ? Stroke Maternal Grandmother   ? Cancer Maternal Grandmother   ? Coronary artery disease Paternal Grandfather   ? Heart disease Paternal Grandfather   ? Stroke Paternal Grandmother   ? ?Social History  ? ?Socioeconomic History  ? Marital status: Single  ?  Spouse name: Not on file  ? Number of children: Not on file  ? Years of education: Not on file  ? Highest education level: Not on file  ?Occupational History  ? Occupation: Insurance claims handler  ?  Employer: [employer]  ?  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   ?Tobacco Use  ? Smoking status: Every Day  ?  Packs/day: 0.50  ?  Years: 2.00  ?  Pack years: 1.00  ?  Types: Cigarettes  ?  Start date: 05/20/2013  ? Smokeless tobacco: Never  ?Vaping Use  ? Vaping Use: Some days  ? Substances: Nicotine, Flavoring  ?Substance and Sexual Activity  ? Alcohol use: Yes  ?  Alcohol/week: 1.0 - 2.0 standard drink  ?  Types: 1 - 2 Standard drinks or equivalent per week  ?  Comment: occ  ? Drug use: Yes  ?  Types: Benzodiazepines, Other-see comments  ?  Comment: Cbd and anxiety meds  ? Sexual activity: Yes  ?  Partners: Male  ?  Birth control/protection: Surgical  ?Other Topics Concern  ? Not on file  ?Social History Narrative  ? Not on file  ? ?Social Determinants of Health  ? ?Financial Resource Strain: Not on file  ?Food Insecurity: Not on file  ?Transportation Needs: Not on file  ?Physical Activity: Not on file  ?Stress: Not on file  ?Social  Connections: Not on file  ? ?Outpatient Medications Prior to Visit  ?Medication Sig  ? amitriptyline (ELAVIL) 50 MG tablet Take 1 tablet (50 mg total) by mouth at bedtime.  ? carbamazepine (TEGRETOL-XR) 200 MG 12 hr tablet Take 1 tablet (200 mg total) by mouth 2 (two) times daily.  ? cyclobenzaprine (FLEXERIL) 10 MG tablet Take 1 tablet (10 mg total) by mouth 3 (three) times daily as needed for  muscle spasms (jaw pain).  ? escitalopram (LEXAPRO) 10 MG tablet Take 5mg  x1 week then increase to 10mg  daily.  ? ibuprofen (ADVIL) 600 MG tablet Take 1 tablet (600 mg total) by mouth every 8 (eight) hours as needed.  ? olmesartan-hydrochlorothiazide (BENICAR HCT) 40-12.5 MG tablet Take 2 tablets by mouth daily.  ? Rimegepant Sulfate (NURTEC) 75 MG TBDP Take 75 mg by mouth daily as needed (migraine).  ? Vitamin D, Ergocalciferol, (DRISDOL) 1.25 MG (50000 UNIT) CAPS capsule Take 1 capsule (50,000 Units total) by mouth every 7 (seven) days.  ? [DISCONTINUED] temazepam (RESTORIL) 15 MG capsule Take 1 capsule (15 mg total) by mouth at bedtime as needed for sleep.  ? [DISCONTINUED] dicyclomine (BENTYL) 10 MG capsule Take 1 capsule (10 mg total) by mouth 4 (four) times daily -  before meals and at bedtime. (Patient taking differently: Take 10 mg by mouth in the morning and at bedtime.)  ? ?No facility-administered medications prior to visit.  ? ?Allergies  ?Allergen Reactions  ? Penicillins Anaphylaxis, Swelling and Rash  ?  Face and throat swell  ? ?Immunization History  ?Administered Date(s) Administered  ? Hepatitis A 06/03/2017  ? Hepatitis B 06/03/2017  ? Influenza,inj,Quad PF,6+ Mos 01/24/2021  ? Influenza-Unspecified 02/02/2018  ? MMR 06/03/2017, 07/01/2017  ? Moderna Sars-Covid-2 Vaccination 04/07/2020, 05/06/2020, 10/01/2020  ? PPD Test 01/01/2019  ? Td 09/06/2014  ? Tdap 12/25/2010  ? Varicella 06/03/2017  ? ? ?Review of Systems ?All review of systems negative except what is listed in the HPI ? ? Objective  ?  ?BP 128/82    Pulse 76   Temp 98.6 ?F (37 ?C)   Ht 6' (1.829 m)   Wt 211 lb 6.4 oz (95.9 kg)   SpO2 97%   BMI 28.67 kg/m?  ?Physical Exam ?Vitals and nursing note reviewed.  ?HENT:  ?   Head: Normocephalic.  ?   Right E

## 2021-08-16 NOTE — Assessment & Plan Note (Addendum)
Chronic. Controlled at this time.  ?Recommend tight BP control given hx of stroke and current neurologic complications.  ?Continue medications as prescribed. Labs today.  ?F/U in 1 week for further evaluation  ?

## 2021-08-16 NOTE — Assessment & Plan Note (Signed)
Chronic. Severe in nature with disabling features and frequent falls.  ?Currently under neurology care.  ?Etiology unclear at this time. Hx of CVA and vestibular schwannoma may be contributing.  ?Recommend continued treatment with neurology.  ?Will plan to f/u in 1 week to discuss medication options that may be helpful.  ?

## 2021-08-16 NOTE — Assessment & Plan Note (Signed)
Chronic. Stable per last MRI. Recommend monitoring for sx of hyperaldosteronism given BP hx and chronic hypokalemia. Will review medical records for further recommendations.  ?

## 2021-08-16 NOTE — Patient Instructions (Signed)
Thank you for choosing [facility] for your Primary Care needs. I am excited for the opportunity to partner with you to meet your health care goals. It was a pleasure meeting you today! ? ?Recommendations from today's visit: ?Stop the restoril and start the ambien for sleep ?I will review all of your labs and history to get your paperwork filled out.  ? ?Information on diet, exercise, and health maintenance recommendations are listed below. This is information to help you be sure you are on track for optimal health and monitoring.  ? ?Please look over this and let us know if you have any questions or if you have completed any of the health maintenance outside of [facility] so that we can be sure your records are up to date.  ?___________________________________________________________ ?About Me: ?I am an Adult-Geriatric Nurse Practitioner with a background in caring for patients for more than 20 years with a strong intensive care background. I provide primary care and sports medicine services to patients age 79 and older within this office. My education had a strong focus on caring for the older adult population, which I am passionate about. I am also the director of the APP Fellowship with [facility]  ? ?My desire is to provide you with the best service through preventive medicine and supportive care. I consider you a part of the medical team and value your input. I work diligently to ensure that you are heard and your needs are met in a safe and effective manner. I want you to feel comfortable with me as your provider and want you to know that your health concerns are important to me. ? ?For your information, our office hours are: ?Monday, Tuesday, and Thursday 8:00 AM - 5:00 PM ?Wednesday and Friday 8:00 AM - 12:00 PM.  ? ?In my time away from the office I am teaching new APP's within the system and am unavailable, but my partner, Dr. Burnard Bunting is in the office for emergent  needs.  ? ?If you have questions or concerns, please call our office at [phone] or send [place] a MyChart message and we will respond as quickly as possible.  ?____________________________________________________________ ?MyChart:  ?For all urgent or time sensitive needs we ask that you please call the office to avoid delays. Our number is [phone]. ?MyChart is not constantly monitored and due to the large volume of messages a day, replies may take up to 72 business hours. ? ?MyChart Policy: ?MyChart allows for you to see your visit notes, after visit summary, provider recommendations, lab and tests results, make an appointment, request refills, and contact your provider or the office for non-urgent questions or concerns. Providers are seeing patients during normal business hours and do not have built in time to review MyChart messages.  ?We ask that you allow a minimum of 3 business days for responses to [employer]. For this reason, please do not send urgent requests through [place]. Please call the office at [phone]. ?New and ongoing conditions may require a visit. We have virtual and in person visit available for your convenience.  ?Complex MyChart concerns may require a visit. Your provider may request you schedule a virtual or in person visit to ensure we are providing the best care possible. ?MyChart messages sent after 11:00 AM on Friday will not be received by the provider until Monday morning.  ?  ?Lab and Test Results: ?You will receive your lab and test results on MyChart as soon  as they are completed and results have been sent by the lab or testing facility. Due to this service, you will receive your results BEFORE your provider.  ?I review lab and tests results each morning prior to seeing patients. Some results require collaboration with other providers to ensure you are receiving the most appropriate care. For this reason, we ask that you please allow a minimum of 3-5 business days  from the time the ALL results have been received for your provider to receive and review lab and test results and contact you about these.  ?Most lab and test result comments from the provider will be sent through [place]. Your provider may recommend changes to the plan of care, follow-up visits, repeat testing, ask questions, or request an office visit to discuss these results. You may reply directly to this message or call the office at [phone] to provide information for the provider or set up an appointment. ?In some instances, you will be called with test results and recommendations. Please let us know if this is preferred and we will make note of this in your chart to provide this for you.    ?If you have not heard a response to your lab or test results in 5 business days from all results returning to [place], please call the office to let us know. We ask that you please avoid calling prior to this time unless there is an emergent concern. Due to high call volumes, this can delay the resulting process. ? ?After Hours: ?For all non-emergency after hours needs, please call the office at [phone] and select the option to reach the on-call provider service. On-call services are shared between multiple [facility] offices and therefore it will not be possible to speak directly with your provider. On-call providers may provide medical advice and recommendations, but are unable to provide refills for maintenance medications.  ?For all emergency or urgent medical needs after normal business hours, we recommend that you seek care at the closest Urgent Care or Emergency Department to ensure appropriate treatment in a timely manner.  ?[facility] [place] has a 24 hour emergency room located on the ground floor for your convenience.  ? ?Urgent Concerns During the Business Day ?Providers are seeing patients from 8AM to [place] with a busy schedule and are most often not able to respond to non-urgent  calls until the end of the day or the next business day. ?If you should have URGENT concerns during the day, please call and speak to the nurse or schedule a same day appointment so that we can address your concern without delay.  ? ?Thank you, again, for choosing me as your health care partner. I appreciate your trust and look forward to learning more about you.  ? ?Worthy Keeler, DNP, AGNP-c ?___________________________________________________________ ? ?Health Maintenance Recommendations ?Screening Testing ?Mammogram ?Every 1 -2 years based on history and risk factors ?Starting at age 8 ?Pap Smear ?Ages 21-39 every 3 years ?Ages 67-65 every 5 years with HPV testing ?More frequent testing may be required based on results and history ?Colon Cancer Screening ?Every 1-10 years based on test performed, risk factors, and history ?Starting at age 50 ?Bone Density Screening ?Every 2-10 years based on history ?Starting at age 57 for women ?Recommendations for men differ based on medication usage, history, and risk factors ?AAA Screening ?One time ultrasound ?Men 47-51 years old who have every smoked ?Lung Cancer Screening ?Low Dose Lung CT every 12 months ?Age 38-80 years with  a 30 pack-year smoking history who still smoke or who have quit within the last 15 years ? ?Screening Labs ?Routine  Labs: Complete Blood Count (CBC), Complete Metabolic Panel (CMP), Cholesterol (Lipid Panel) ?Every 6-12 months based on history and medications ?May be recommended more frequently based on current conditions or previous results ?Hemoglobin A1c Lab ?Every 3-12 months based on history and previous results ?Starting at age 5 or earlier with diagnosis of diabetes, high cholesterol, BMI >26, and/or risk factors ?Frequent monitoring for patients with diabetes to ensure blood sugar control ?Thyroid Panel (TSH w/ T3 & T4) ?Every 6 months based on history, symptoms, and risk factors ?May be repeated more often if on medication ?HIV ?One  time testing for all patients 45 and older ?May be repeated more frequently for patients with increased risk factors or exposure ?Hepatitis C ?One time testing for all patients 55 and older ?May be repeated more f

## 2021-08-17 ENCOUNTER — Telehealth (HOSPITAL_BASED_OUTPATIENT_CLINIC_OR_DEPARTMENT_OTHER): Payer: Self-pay

## 2021-08-17 LAB — CBC WITH DIFFERENTIAL/PLATELET
Basophils Absolute: 0 10*3/uL (ref 0.0–0.2)
Basos: 1 %
EOS (ABSOLUTE): 0.1 10*3/uL (ref 0.0–0.4)
Eos: 2 %
Hematocrit: 43.8 % (ref 34.0–46.6)
Hemoglobin: 15 g/dL (ref 11.1–15.9)
Immature Grans (Abs): 0 10*3/uL (ref 0.0–0.1)
Immature Granulocytes: 0 %
Lymphocytes Absolute: 1.8 10*3/uL (ref 0.7–3.1)
Lymphs: 28 %
MCH: 32.3 pg (ref 26.6–33.0)
MCHC: 34.2 g/dL (ref 31.5–35.7)
MCV: 94 fL (ref 79–97)
Monocytes Absolute: 0.4 10*3/uL (ref 0.1–0.9)
Monocytes: 6 %
Neutrophils Absolute: 4.2 10*3/uL (ref 1.4–7.0)
Neutrophils: 63 %
Platelets: 315 10*3/uL (ref 150–450)
RBC: 4.64 x10E6/uL (ref 3.77–5.28)
RDW: 12.5 % (ref 11.7–15.4)
WBC: 6.5 10*3/uL (ref 3.4–10.8)

## 2021-08-17 LAB — IRON,TIBC AND FERRITIN PANEL
Ferritin: 34 ng/mL (ref 15–150)
Iron Saturation: 15 % (ref 15–55)
Iron: 66 ug/dL (ref 27–159)
Total Iron Binding Capacity: 443 ug/dL (ref 250–450)
UIBC: 377 ug/dL (ref 131–425)

## 2021-08-17 LAB — COMPREHENSIVE METABOLIC PANEL
ALT: 37 IU/L — ABNORMAL HIGH (ref 0–32)
AST: 28 IU/L (ref 0–40)
Albumin/Globulin Ratio: 1.3 (ref 1.2–2.2)
Albumin: 3.8 g/dL (ref 3.8–4.8)
Alkaline Phosphatase: 197 IU/L — ABNORMAL HIGH (ref 44–121)
BUN/Creatinine Ratio: 7 — ABNORMAL LOW (ref 9–23)
BUN: 7 mg/dL (ref 6–24)
Bilirubin Total: 0.6 mg/dL (ref 0.0–1.2)
CO2: 24 mmol/L (ref 20–29)
Calcium: 9.4 mg/dL (ref 8.7–10.2)
Chloride: 107 mmol/L — ABNORMAL HIGH (ref 96–106)
Creatinine, Ser: 0.97 mg/dL (ref 0.57–1.00)
Globulin, Total: 2.9 g/dL (ref 1.5–4.5)
Glucose: 91 mg/dL (ref 70–99)
Potassium: 4.5 mmol/L (ref 3.5–5.2)
Sodium: 146 mmol/L — ABNORMAL HIGH (ref 134–144)
Total Protein: 6.7 g/dL (ref 6.0–8.5)
eGFR: 73 mL/min/{1.73_m2} (ref >59)

## 2021-08-17 LAB — B12 AND FOLATE PANEL
Folate: 4.4 ng/mL (ref >3.0)
Vitamin B-12: 332 pg/mL (ref 232–1245)

## 2021-08-17 LAB — VITAMIN D 25 HYDROXY (VIT D DEFICIENCY, FRACTURES): Vit D, 25-Hydroxy: 7.8 ng/mL — ABNORMAL LOW (ref 30.0–100.0)

## 2021-08-17 LAB — T3: T3, Total: 142 ng/dL (ref 71–180)

## 2021-08-17 LAB — TSH: TSH: 2.04 u[IU]/mL (ref 0.450–4.500)

## 2021-08-17 LAB — HEMOGLOBIN A1C
Est. average glucose Bld gHb Est-mCnc: 111 mg/dL
Hgb A1c MFr Bld: 5.5 % (ref 4.8–5.6)

## 2021-08-17 LAB — T4: T4, Total: 8 ug/dL (ref 4.5–12.0)

## 2021-08-17 NOTE — Telephone Encounter (Signed)
FMLA paper work was completed and faxed to [phone] the [facility]. Paper work was placed upfront to be scanned.  ?

## 2021-08-21 NOTE — Assessment & Plan Note (Signed)
Chronic. Not well controlled at this time with daily recurrence. Currently managed with neurology for new diagnosis of brain mass. Will follow.  ?

## 2021-08-21 NOTE — Assessment & Plan Note (Signed)
See migraine ? ?

## 2021-08-22 ENCOUNTER — Encounter (HOSPITAL_BASED_OUTPATIENT_CLINIC_OR_DEPARTMENT_OTHER): Payer: Self-pay | Admitting: Nurse Practitioner

## 2021-08-22 ENCOUNTER — Other Ambulatory Visit (HOSPITAL_BASED_OUTPATIENT_CLINIC_OR_DEPARTMENT_OTHER): Payer: Self-pay | Admitting: Nurse Practitioner

## 2021-08-22 DIAGNOSIS — E559 Vitamin D deficiency, unspecified: Secondary | ICD-10-CM

## 2021-08-22 MED ORDER — VITAMIN D (ERGOCALCIFEROL) 1.25 MG (50000 UNIT) PO CAPS: 50000.0000 [IU] | ORAL_CAPSULE | ORAL | 0 refills | Status: AC

## 2021-08-31 ENCOUNTER — Ambulatory Visit (INDEPENDENT_AMBULATORY_CARE_PROVIDER_SITE_OTHER): Payer: 59 | Admitting: Nurse Practitioner

## 2021-08-31 ENCOUNTER — Encounter (HOSPITAL_BASED_OUTPATIENT_CLINIC_OR_DEPARTMENT_OTHER): Payer: Self-pay | Admitting: Nurse Practitioner

## 2021-08-31 DIAGNOSIS — G43009 Migraine without aura, not intractable, without status migrainosus: Secondary | ICD-10-CM | POA: Diagnosis not present

## 2021-08-31 DIAGNOSIS — G6289 Other specified polyneuropathies: Secondary | ICD-10-CM

## 2021-08-31 DIAGNOSIS — D333 Benign neoplasm of cranial nerves: Secondary | ICD-10-CM | POA: Diagnosis not present

## 2021-08-31 DIAGNOSIS — F5101 Primary insomnia: Secondary | ICD-10-CM | POA: Diagnosis not present

## 2021-08-31 DIAGNOSIS — R69 Illness, unspecified: Secondary | ICD-10-CM | POA: Diagnosis not present

## 2021-08-31 NOTE — Progress Notes (Signed)
Virtual Visit Encounter telephone visit. ? ? ?I connected with  [name] on 09/07/21 at 10:50 AM EDT by secure audio telemedicine application. I verified that I am speaking with the correct person using two identifiers. ?  ?I introduced myself as a Designer, jewellery with the practice. The limitations of evaluation and management by telemedicine discussed with the patient and the availability of in person appointments. The patient expressed verbal understanding and consent to proceed. ? ?Participating parties in this visit include: Myself and patient ? ?The patient is: Patient Location: Home ?I am: Provider Location: Office/Clinic ?Subjective:   ? ?CC and HPI: [name] is a 46 y.o. year old female presenting for follow up of insomnia, migraines. ?Sleeping much better with ambien- able to sleep through the night ?She has not had any negative side effects or daytime sleepiness with medication use.  ? ?She does endorse continued concerns with her symptoms of neuropathy, migraines, and vision changes. She expresses frustration of the unknown with the current tumor on her brain and the next steps. She is scheduled to follow-up with neurology in the future, but is not sure what to do in the meantime. She reports that she is experiencing ongoing pain in the side of her face and ear and her neuropathy symptoms are very bothersome. She does not feel the current medication is effective, but did have better effectiveness in the past with gabapentin.  ? ?Past medical history, Surgical history, Family history not pertinant except as noted below, Social history, Allergies, and medications have been entered into the medical record, reviewed, and corrections made.  ? ?Review of Systems:  ?All review of systems negative except what is listed in the HPI ? ?Objective:   ? ?Alert and oriented x 4 ?Speaking in clear sentences with no shortness of breath. ?No distress. ? ?Impression and Recommendations:   ? ?Problem  List Items Addressed This Visit   ? ? Peripheral neuropathy - Primary  ?  Bilateral peripheral neuropathy of the lower extremities not well controlled at this time.  Recommend stop Lyrica and restart gabapentin as this has been more effective for her in the past.  We will send in medication.  Patient to monitor for new or worsening symptoms.  If no improvement with current dose she will let us know and we can consider increase. ? ?  ?  ? Relevant Medications  ? gabapentin (NEURONTIN) 300 MG capsule  ? Migraine  ?  Chronic.  Ongoing.  Not well controlled at this time.  Discussed with patient that symptoms are most likely being exacerbated by current mass found on the brain which is directly affecting the nerve path in the area that pain is felt.  She is not having much relief with the Lyrica for the neuropathy in her lower extremities or the migraine pain.  Will switch back to gabapentin.  Recommend starting back at previous dose of 600 mg up to 4 times a day and stop Lyrica immediately.  If no improvement with medication change patient will contact the office and we can consider alternative medications. ? ?  ?  ? Relevant Medications  ? gabapentin (NEURONTIN) 300 MG capsule  ? Insomnia  ?  Significant improvement of insomnia with addition of Ambien at bedtime.  I do feel that this is helpful for her overall health and wellbeing as she is experiencing significant symptoms at this time.  We will plan to continue medication at this time and monitor closely.  She will  follow-up if symptoms worsen or if sleep becomes interrupted again. ? ?  ?  ? Vestibular schwannoma ([place])  ?  Chronic.  Suspect schwannoma is causing significant pain in the face and contributing to migraine headache exacerbations her symptoms are not well controlled at this time with her current medication regimen.  Given her presentation and previous positive experience with gabapentin will stop Lyrica and plan to start gabapentin daily.  She does have  follow-up with neurology in the near future.  Recommend close monitoring and notify if any new or worsening symptoms develop. ? ?  ?  ? Relevant Medications  ? gabapentin (NEURONTIN) 300 MG capsule  ? ? ?orders and follow up as documented in EMR ?I discussed the assessment and treatment plan with the patient. The patient was provided an opportunity to ask questions and all were answered. The patient agreed with the plan and demonstrated an understanding of the instructions. ?  ?The patient was advised to call back or seek an in-person evaluation if the symptoms worsen or if the condition fails to improve as anticipated. ? ?Follow-Up: prn ? ?I provided 38 minutes of non-face-to-face interaction with this non face-to-face encounter including intake, same-day documentation, and chart review.  ? ?Orma Render, NP , DNP, AGNP-c ?[facility] ?Primary Care & Sports Medicine at [facility] ?[phone] ?[phone] (fax) ? ?

## 2021-09-06 ENCOUNTER — Telehealth: Payer: 59 | Admitting: Physician Assistant

## 2021-09-06 DIAGNOSIS — H01004 Unspecified blepharitis left upper eyelid: Secondary | ICD-10-CM | POA: Diagnosis not present

## 2021-09-06 MED ORDER — POLYMYXIN B-TRIMETHOPRIM 10000-0.1 UNIT/ML-% OP SOLN: OPHTHALMIC | 0 refills | Status: DC

## 2021-09-06 NOTE — Patient Instructions (Signed)
?[name], thank you for joining Erin Rio, PA-C for today's virtual visit.  While this provider is not your primary care provider (PCP), if your PCP is located in our provider database this encounter information will be shared with them immediately following your visit. ? ?Consent: ?(Patient) [name] provided verbal consent for this virtual visit at the beginning of the encounter. ? ?Current Medications: ? ?Current Outpatient Medications:  ?  trimethoprim-polymyxin b (POLYTRIM) ophthalmic solution, Apply 1-2 drops into affected eye QID x 5 days., Disp: 10 mL, Rfl: 0 ?  amitriptyline (ELAVIL) 50 MG tablet, Take 1 tablet (50 mg total) by mouth at bedtime., Disp: 90 tablet, Rfl: 4 ?  carbamazepine (TEGRETOL-XR) 200 MG 12 hr tablet, Take 1 tablet (200 mg total) by mouth 2 (two) times daily., Disp: 60 tablet, Rfl: 01 ?  cyclobenzaprine (FLEXERIL) 10 MG tablet, Take 1 tablet (10 mg total) by mouth 3 (three) times daily as needed for muscle spasms (jaw pain)., Disp: 30 tablet, Rfl: 1 ?  escitalopram (LEXAPRO) 10 MG tablet, Take '5mg'$  x1 week then increase to '10mg'$  daily., Disp: 90 tablet, Rfl: 1 ?  ibuprofen (ADVIL) 600 MG tablet, Take 1 tablet (600 mg total) by mouth every 8 (eight) hours as needed., Disp: 30 tablet, Rfl: 0 ?  olmesartan-hydrochlorothiazide (BENICAR HCT) 40-12.5 MG tablet, Take 2 tablets by mouth daily., Disp: 180 tablet, Rfl: 1 ?  Rimegepant Sulfate (NURTEC) 75 MG TBDP, Take 75 mg by mouth daily as needed (migraine)., Disp: 15 tablet, Rfl: 3 ?  Vitamin D, Ergocalciferol, (DRISDOL) 1.25 MG (50000 UNIT) CAPS capsule, Take 1 capsule (50,000 Units total) by mouth every 7 (seven) days for 20 doses., Disp: 20 capsule, Rfl: 0 ?  zolpidem (AMBIEN) 5 MG tablet, Take 1 tablet (5 mg total) by mouth at bedtime as needed for sleep., Disp: 30 tablet, Rfl: 1  ? ?Medications ordered in this encounter:  ?Meds ordered this encounter  ?Medications  ? trimethoprim-polymyxin b (POLYTRIM)  ophthalmic solution  ?  Sig: Apply 1-2 drops into affected eye QID x 5 days.  ?  Dispense:  10 mL  ?  Refill:  0  ?  Order Specific Question:   Supervising Provider  ?  Answer:   Noemi Chapel [[id number]]  ?  ? ?*If you need refills on other medications prior to your next appointment, please contact your pharmacy* ? ?Follow-Up: ?Call back or seek an in-person evaluation if the symptoms worsen or if the condition fails to improve as anticipated. ? ?Other Instructions ?Please keep hands washed. ?Apply compresses for 10 minutes a few times daily as discussed. ?Remove contacts until this is resolved. Use the antibiotic drops I have sent in. ? ?If not improving within 48 hours or any new or worsening symptoms, you need to be evaluated in person. Any fever, vision changes -- ER.  ? ? ?If you have been instructed to have an in-person evaluation today at a local Urgent Care facility, please use the link below. It will take you to a list of all of our available [facility], including address, phone number and hours of operation. Please do not delay care.  ?[facility] ? ?If you or a family member do not have a primary care provider, use the link below to schedule a visit and establish care. When you choose a [facility] primary care physician or advanced practice provider, you gain a long-term partner in health. ?Find a Primary Care Provider ? ?Learn  more about [facility]'s in-office and virtual care options: ?[facility]  ?

## 2021-09-06 NOTE — Progress Notes (Signed)
?Virtual Visit Consent  ? ?[name], you are scheduled for a virtual visit with a [facility] provider today.   ?  ?Just as with appointments in the office, your consent must be obtained to participate.  Your consent will be active for this visit and any virtual visit you may have with one of our providers in the next 365 days.   ?  ?If you have a MyChart account, a copy of this consent can be sent to you electronically.  All virtual visits are billed to your insurance company just like a traditional visit in the office.   ? ?As this is a virtual visit, video technology does not allow for your provider to perform a traditional examination.  This may limit your provider's ability to fully assess your condition.  If your provider identifies any concerns that need to be evaluated in person or the need to arrange testing (such as labs, EKG, etc.), we will make arrangements to do so.   ?  ?Although advances in technology are sophisticated, we cannot ensure that it will always work on either your end or our end.  If the connection with a video visit is poor, the visit may have to be switched to a telephone visit.  With either a video or telephone visit, we are not always able to ensure that we have a secure connection.    ? ?Also, by engaging in this virtual visit, you consent to the provision of healthcare. Additionally, you authorize for your insurance to be billed (if applicable) for the services provided during this visit.  ? ?I need to obtain your verbal consent now.   Are you willing to proceed with your visit today?  ?  ?[name] has provided verbal consent on 09/06/2021 for a virtual visit (video or telephone). ?  ?[name], PA-C  ? ?Date: 09/06/2021 2:40 PM ? ? ?Virtual Visit via Video Note  ? ?ILeeanne Rio, connected with  [name]  ([id number], 08-19-1975) on 09/06/21 at  2:30 PM EDT by a video-enabled telemedicine application and verified that I am speaking with the  correct person using two identifiers. ? ?Location: ?Patient: Virtual Visit Location Patient: Home ?Provider: Virtual Visit Location Provider: Home Office ?  ?I discussed the limitations of evaluation and management by telemedicine and the availability of in person appointments. The patient expressed understanding and agreed to proceed.   ? ?History of Present Illness: ?[name] is a 46 y.o. who identifies as a female who was assigned female at birth, and is being seen today for swelling and pain of L upper eyelid over the past few days. Denies noting a stye or any "bump" in the eyelid. Notes initially was itching and she rubbed the area quite a bit with development of other symptoms over next few days. Denies fevers, chills. Denies eyeball pain or redness. Denies vision change. Denies symptoms of other eyelids.  ? ?HPI: HPI  ?Problems:  ?Patient Active Problem List  ? Diagnosis Date Noted  ? Vestibular schwannoma ([place]) 08/16/2021  ? Insomnia 07/31/2021  ? Intractable headache 07/03/2021  ? TMJ syndrome 06/18/2021  ? B12 deficiency 01/24/2021  ? Corneal abrasion 11/25/2020  ? Elevated MCV 11/25/2020  ? Visual changes 11/25/2020  ? Migraine 11/15/2020  ? Elevated liver enzymes 11/02/2020  ? Peripheral neuropathy 10/11/2020  ? Irritable bowel syndrome with diarrhea 02/02/2018  ? Generalized anxiety disorder 02/02/2018  ? Perirectal abscess 10/28/2016  ? Chronic fatigue 01/09/2016  ?  Adrenal nodule ([place]) 01/09/2016  ? Chronic low back pain 11/21/2015  ? Elevated lipase 12/14/2014  ? AP (abdominal pain) 12/14/2014  ? Nausea and vomiting 11/29/2014  ? Intractable abdominal pain 11/28/2014  ? Hypokalemia 11/28/2014  ? HTN (hypertension) 11/28/2014  ? Pilonidal abscess 12/03/2013  ? Patellar instability of left knee 06/08/2013  ?  ?Allergies:  ?Allergies  ?Allergen Reactions  ? Penicillins Anaphylaxis, Swelling and Rash  ?  Face and throat swell  ? ?Medications:  ?Current Outpatient Medications:  ?   trimethoprim-polymyxin b (POLYTRIM) ophthalmic solution, Apply 1-2 drops into affected eye QID x 5 days., Disp: 10 mL, Rfl: 0 ?  amitriptyline (ELAVIL) 50 MG tablet, Take 1 tablet (50 mg total) by mouth at bedtime., Disp: 90 tablet, Rfl: 4 ?  carbamazepine (TEGRETOL-XR) 200 MG 12 hr tablet, Take 1 tablet (200 mg total) by mouth 2 (two) times daily., Disp: 60 tablet, Rfl: 01 ?  cyclobenzaprine (FLEXERIL) 10 MG tablet, Take 1 tablet (10 mg total) by mouth 3 (three) times daily as needed for muscle spasms (jaw pain)., Disp: 30 tablet, Rfl: 1 ?  escitalopram (LEXAPRO) 10 MG tablet, Take '5mg'$  x1 week then increase to '10mg'$  daily., Disp: 90 tablet, Rfl: 1 ?  ibuprofen (ADVIL) 600 MG tablet, Take 1 tablet (600 mg total) by mouth every 8 (eight) hours as needed., Disp: 30 tablet, Rfl: 0 ?  olmesartan-hydrochlorothiazide (BENICAR HCT) 40-12.5 MG tablet, Take 2 tablets by mouth daily., Disp: 180 tablet, Rfl: 1 ?  Rimegepant Sulfate (NURTEC) 75 MG TBDP, Take 75 mg by mouth daily as needed (migraine)., Disp: 15 tablet, Rfl: 3 ?  Vitamin D, Ergocalciferol, (DRISDOL) 1.25 MG (50000 UNIT) CAPS capsule, Take 1 capsule (50,000 Units total) by mouth every 7 (seven) days for 20 doses., Disp: 20 capsule, Rfl: 0 ?  zolpidem (AMBIEN) 5 MG tablet, Take 1 tablet (5 mg total) by mouth at bedtime as needed for sleep., Disp: 30 tablet, Rfl: 1 ? ?Observations/Objective: ?Patient is well-developed, well-nourished in no acute distress.  ?Resting comfortably at home.  ?Head is normocephalic, atraumatic.  ?No labored breathing. ?Speech is clear and coherent with logical content.  ?Patient is alert and oriented at baseline.  ?L upper eyelid with redness and swelling. Tenderness to palpation per patient. No noted drainage. Conjunctiva are within normal limits bilaterally. Pupils, equal and round. EOMI.  ? ?Assessment and Plan: ?1. Blepharitis of left upper eyelid, unspecified type ?- trimethoprim-polymyxin b (POLYTRIM) ophthalmic solution; Apply 1-2  drops into affected eye QID x 5 days.  Dispense: 10 mL; Refill: 0 ? ?Start Polytrim drops. Compresses discussed. Keep contacts out until fully resolved. Can use Tylenol OTC if needed. If not resolving or any worsening symptoms, or if developing fever, eye pain or vision change -- ER/UC assessment.  ? ?Follow Up Instructions: ?I discussed the assessment and treatment plan with the patient. The patient was provided an opportunity to ask questions and all were answered. The patient agreed with the plan and demonstrated an understanding of the instructions.  A copy of instructions were sent to the patient via MyChart unless otherwise noted below.  ? ?The patient was advised to call back or seek an in-person evaluation if the symptoms worsen or if the condition fails to improve as anticipated. ? ?Time:  ?I spent 12 minutes with the patient via telehealth technology discussing the above problems/concerns.   ? ?[name], PA-C ?

## 2021-09-07 ENCOUNTER — Encounter (HOSPITAL_BASED_OUTPATIENT_CLINIC_OR_DEPARTMENT_OTHER): Payer: Self-pay | Admitting: Nurse Practitioner

## 2021-09-07 MED ORDER — GABAPENTIN 300 MG PO CAPS: 600.0000 mg | ORAL_CAPSULE | Freq: Four times a day (QID) | ORAL | 11 refills | Status: AC

## 2021-09-07 NOTE — Assessment & Plan Note (Signed)
Chronic.  Suspect schwannoma is causing significant pain in the face and contributing to migraine headache exacerbations her symptoms are not well controlled at this time with her current medication regimen.  Given her presentation and previous positive experience with gabapentin will stop Lyrica and plan to start gabapentin daily.  She does have follow-up with neurology in the near future.  Recommend close monitoring and notify if any new or worsening symptoms develop. ?

## 2021-09-07 NOTE — Assessment & Plan Note (Signed)
Bilateral peripheral neuropathy of the lower extremities not well controlled at this time.  Recommend stop Lyrica and restart gabapentin as this has been more effective for her in the past.  We will send in medication.  Patient to monitor for new or worsening symptoms.  If no improvement with current dose she will let us know and we can consider increase. ?

## 2021-09-07 NOTE — Assessment & Plan Note (Signed)
Significant improvement of insomnia with addition of Ambien at bedtime.  I do feel that this is helpful for her overall health and wellbeing as she is experiencing significant symptoms at this time.  We will plan to continue medication at this time and monitor closely.  She will follow-up if symptoms worsen or if sleep becomes interrupted again. ?

## 2021-09-07 NOTE — Assessment & Plan Note (Signed)
Chronic.  Ongoing.  Not well controlled at this time.  Discussed with patient that symptoms are most likely being exacerbated by current mass found on the brain which is directly affecting the nerve path in the area that pain is felt.  She is not having much relief with the Lyrica for the neuropathy in her lower extremities or the migraine pain.  Will switch back to gabapentin.  Recommend starting back at previous dose of 600 mg up to 4 times a day and stop Lyrica immediately.  If no improvement with medication change patient will contact the office and we can consider alternative medications. ?

## 2021-09-12 ENCOUNTER — Ambulatory Visit: Payer: 59 | Admitting: Family Medicine

## 2021-09-19 ENCOUNTER — Encounter (HOSPITAL_BASED_OUTPATIENT_CLINIC_OR_DEPARTMENT_OTHER): Payer: Self-pay

## 2021-09-19 ENCOUNTER — Telehealth (HOSPITAL_BASED_OUTPATIENT_CLINIC_OR_DEPARTMENT_OTHER): Payer: Self-pay

## 2021-09-19 ENCOUNTER — Emergency Department (HOSPITAL_BASED_OUTPATIENT_CLINIC_OR_DEPARTMENT_OTHER): Payer: 59

## 2021-09-19 ENCOUNTER — Other Ambulatory Visit: Payer: Self-pay

## 2021-09-19 ENCOUNTER — Emergency Department (HOSPITAL_BASED_OUTPATIENT_CLINIC_OR_DEPARTMENT_OTHER)
Admission: EM | Admit: 2021-09-19 | Discharge: 2021-09-19 | Disposition: A | Discharge status: Home / Self Care | Payer: 59 | Attending: Emergency Medicine | Admitting: Emergency Medicine

## 2021-09-19 DIAGNOSIS — I160 Hypertensive urgency: Secondary | ICD-10-CM | POA: Insufficient documentation

## 2021-09-19 DIAGNOSIS — F17200 Nicotine dependence, unspecified, uncomplicated: Secondary | ICD-10-CM | POA: Insufficient documentation

## 2021-09-19 DIAGNOSIS — I10 Essential (primary) hypertension: Secondary | ICD-10-CM | POA: Diagnosis not present

## 2021-09-19 DIAGNOSIS — J449 Chronic obstructive pulmonary disease, unspecified: Secondary | ICD-10-CM | POA: Insufficient documentation

## 2021-09-19 DIAGNOSIS — R69 Illness, unspecified: Secondary | ICD-10-CM | POA: Diagnosis not present

## 2021-09-19 DIAGNOSIS — Z79899 Other long term (current) drug therapy: Secondary | ICD-10-CM | POA: Insufficient documentation

## 2021-09-19 DIAGNOSIS — R519 Headache, unspecified: Secondary | ICD-10-CM | POA: Diagnosis not present

## 2021-09-19 MED ORDER — HYDRALAZINE HCL 25 MG PO TABS
25.0000 mg | ORAL_TABLET | Freq: Once | ORAL | Status: AC
  Administered 2021-09-19: 25 mg via ORAL
  Filled 2021-09-19: qty 1

## 2021-09-19 MED ORDER — DIPHENHYDRAMINE HCL 25 MG PO CAPS
25.0000 mg | ORAL_CAPSULE | Freq: Once | ORAL | Status: AC
  Administered 2021-09-19: 25 mg via ORAL
  Filled 2021-09-19: qty 1

## 2021-09-19 MED ORDER — PROCHLORPERAZINE EDISYLATE 10 MG/2ML IJ SOLN
10.0000 mg | Freq: Once | INTRAMUSCULAR | Status: AC
  Administered 2021-09-19: 10 mg via INTRAMUSCULAR
  Filled 2021-09-19: qty 2

## 2021-09-19 MED ORDER — KETOROLAC TROMETHAMINE 15 MG/ML IJ SOLN
15.0000 mg | Freq: Once | INTRAMUSCULAR | Status: AC
  Administered 2021-09-19: 15 mg via INTRAMUSCULAR
  Filled 2021-09-19: qty 1

## 2021-09-19 NOTE — ED Provider Notes (Signed)
?[facility] ?Provider Note ? ? ?CSN: [id number] ?Arrival date & time: 09/19/21  1744 ? ?  ? ?History ?Chief Complaint  ?Patient presents with  ? Headache  ? ? ?[name] is a 46 y.o. female with history of migraines on Nurtec, hypertension on Benicar, and acoustic neuroma who presents today with a 1 day history of elevated blood pressures and headache.  Patient states that the last 24 hours her blood pressures not been controlled with her normal antihypertensive medications.  She is complaining of a global headache that was unresolved with Nurtec.  She denies photophobia, focal weakness, focal numbness, nausea, vomiting, blurred vision, syncope.  No chest pain or shortness of breath.  No belly pain. ? ? ?Headache ? ?  ? ?Home Medications ?Prior to Admission medications   ?Medication Sig Start Date End Date Taking? Authorizing Provider  ?amitriptyline (ELAVIL) 50 MG tablet Take 1 tablet (50 mg total) by mouth at bedtime. 01/19/21   Penumalli, Earlean Polka, MD  ?carbamazepine (TEGRETOL-XR) 200 MG 12 hr tablet Take 1 tablet (200 mg total) by mouth 2 (two) times daily. 07/03/21   Luetta Nutting, DO  ?cyclobenzaprine (FLEXERIL) 10 MG tablet Take 1 tablet (10 mg total) by mouth 3 (three) times daily as needed for muscle spasms (jaw pain). 06/18/21   Luetta Nutting, DO  ?escitalopram (LEXAPRO) 10 MG tablet Take '5mg'$  x1 week then increase to '10mg'$  daily. 07/31/21   Luetta Nutting, DO  ?gabapentin (NEURONTIN) 300 MG capsule Take 2 capsules (600 mg total) by mouth 4 (four) times daily. 09/07/21   Orma Render, NP  ?ibuprofen (ADVIL) 600 MG tablet Take 1 tablet (600 mg total) by mouth every 8 (eight) hours as needed. 06/02/21   Gildardo Pounds, NP  ?olmesartan-hydrochlorothiazide (BENICAR HCT) 40-12.5 MG tablet Take 2 tablets by mouth daily. 07/31/21   Luetta Nutting, DO  ?Rimegepant Sulfate (NURTEC) 75 MG TBDP Take 75 mg by mouth daily as needed (migraine). 12/27/20   Luetta Nutting, DO   ?trimethoprim-polymyxin b (POLYTRIM) ophthalmic solution Apply 1-2 drops into affected eye QID x 5 days. 09/06/21   Brunetta Jeans, PA-C  ?Vitamin D, Ergocalciferol, (DRISDOL) 1.25 MG (50000 UNIT) CAPS capsule Take 1 capsule (50,000 Units total) by mouth every 7 (seven) days for 20 doses. 08/22/21 01/03/22  Orma Render, NP  ?zolpidem (AMBIEN) 5 MG tablet Take 1 tablet (5 mg total) by mouth at bedtime as needed for sleep. 08/16/21   Orma Render, NP  ?   ? ?Allergies    ?Penicillins   ? ?Review of Systems   ?Review of Systems  ?Neurological:  Positive for headaches.  ?All other systems reviewed and are negative. ? ?Physical Exam ?Updated Vital Signs ?BP (!) 169/88   Pulse (!) 57   Temp (!) 97 ?F (36.1 ?C) (Temporal)   Resp 18   Ht 6' (1.829 m)   Wt 95.9 kg   SpO2 92%   BMI 28.67 kg/m?  ?Physical Exam ?Vitals and nursing note reviewed.  ?Constitutional:   ?   General: She is not in acute distress. ?   Appearance: Normal appearance.  ?HENT:  ?   Head: Normocephalic and atraumatic.  ?Eyes:  ?   General:     ?   Right eye: No discharge.     ?   Left eye: No discharge.  ?Cardiovascular:  ?   Comments: Regular rate and rhythm.  S1/S2 are distinct without any evidence of murmur, rubs, or gallops.  Radial pulses are 2+ bilaterally.  Dorsalis pedis pulses are 2+ bilaterally.  No evidence of pedal edema. ?Pulmonary:  ?   Comments: Clear to auscultation bilaterally.  Normal effort.  No respiratory distress.  No evidence of wheezes, rales, or rhonchi heard throughout. ?Abdominal:  ?   General: Abdomen is flat. Bowel sounds are normal. There is no distension.  ?   Tenderness: There is no abdominal tenderness. There is no guarding or rebound.  ?Musculoskeletal:     ?   General: Normal range of motion.  ?   Cervical back: Neck supple.  ?Skin: ?   General: Skin is warm and dry.  ?   Findings: No rash.  ?Neurological:  ?   General: No focal deficit present.  ?   Mental Status: She is alert.  ?   Comments: Cranial nerves II  through XII are intact.  Pupils are equal round reactive to light.  Normal extraocular movements without any signs of nystagmus or entrapment.  Upper and lower extremities Exhibit 5/5 strength and normal sensation.  No pronator drift.  No evidence of dysmetria with finger-to-nose.  ?Psychiatric:     ?   Mood and Affect: Mood normal.     ?   Behavior: Behavior normal.  ? ? ?ED Results / Procedures / Treatments   ?Labs ?(all labs ordered are listed, but only abnormal results are displayed) ?Labs Reviewed - No data to display ? ?EKG ?None ? ?Radiology ?CT Head Wo Contrast ? ?Result Date: 09/19/2021 ?CLINICAL DATA:  Headaches, hypertension EXAM: CT HEAD WITHOUT CONTRAST TECHNIQUE: Contiguous axial images were obtained from the base of the skull through the vertex without intravenous contrast. RADIATION DOSE REDUCTION: This exam was performed according to the departmental dose-optimization program which includes automated exposure control, adjustment of the mA and/or kV according to patient size and/or use of iterative reconstruction technique. COMPARISON:  11/25/2020 FINDINGS: Brain: No acute intracranial findings are seen in noncontrast CT brain. Ventricles are not dilated. There is no shift of midline structures. There are no epidural or subdural fluid collections. Vascular: Unremarkable. Skull: Unremarkable. Sinuses/Orbits: There is mild mucosal thickening in the ethmoid sinus. Other: There is increased amount of CSF insula suggesting partial empty sella. IMPRESSION: No acute intracranial findings are seen in noncontrast CT brain. Mild chronic ethmoid sinusitis. Electronically Signed   By: Elmer Picker M.D.   On: 09/19/2021 19:23   ? ?Procedures ?Procedures  ? ? ?Medications Ordered in ED ?Medications  ?prochlorperazine (COMPAZINE) injection 10 mg (10 mg Intramuscular Given 09/19/21 1857)  ?diphenhydrAMINE (BENADRYL) capsule 25 mg (25 mg Oral Given 09/19/21 1856)  ?hydrALAZINE (APRESOLINE) tablet 25 mg (25 mg Oral  Given 09/19/21 1929)  ?ketorolac (TORADOL) 15 MG/ML injection 15 mg (15 mg Intramuscular Given 09/19/21 1936)  ? ? ?ED Course/ Medical Decision Making/ A&P ?Clinical Course as of 09/19/21 2133  ?Wed Sep 19, 2021  ?2019 After headache cocktail the patient is now resting comfortably sleeping in the emergency department.  She states her headache is significantly better.  Pressures are still elevated. [CF]  ?2130 On reevaluation, patient states her headache has resolved.  Repeat blood pressure continues to downtrend.  Patient wishes to go home.  I think this is a reasonable plan.  Strict return precautions were discussed with the patient.  I will plan to discharge her home with PCP follow-up. [CF]  ?  ?Clinical Course User Index ?[CF] Myna Bright M, PA-C  ? ?                        ?  Medical Decision Making ?Amount and/or Complexity of Data Reviewed ?Radiology: ordered. ? ?Risk ?Prescription drug management. ? ? ?This patient presents to the ED for concern of elevated blood pressures and headache, this involves an extensive number of treatment options, and is a complaint that carries with it a high risk of complications and morbidity.  The differential diagnosis includes intraparenchymal hemorrhage, migraine, hypertensive urgency versus emergency. ? ? ?Co morbidities that complicate the patient evaluation ? ?Past Medical History:  ?Diagnosis Date  ? Adnexal cyst 2013  ? right  ? Anemia 2015  ? Pernicious and iron deficiency. As of 2015/2016 no longer requiring po iron or B12 shots.   ? Anxiety 2020  ? Anxiety and ptsd  ? Chondromalacia 2005  ? left knee. arthroscopy x 2.   ? Colitis   ? COPD (chronic obstructive pulmonary disease) ([place]) 2018  ? Headache(784.0)   ? migraines  ? Hydradenitis 2001  ? axillary, multiple surgical I & Ds/excisions.  ? Hypertension   ? Nausea & vomiting 11/28/2014  ? Neuromuscular disorder ([place]) 2020  ? Peripheral neuropathy  ? Obesity (BMI 30.0-34.9)   ? Pilonidal cyst   ? Pityriasis rosea   ?  pt also gives hx of seborrheic dermatitis.   ? ? ?Additional history obtained: ? ?Additional history obtained from nursing note ?External records from outside source obtained and reviewed including admission note from July of

## 2021-09-19 NOTE — Discharge Instructions (Signed)
Please follow-up with your PCP for possible medication adjustments.  Return to the emerged part for any worsening symptoms you might have. ?

## 2021-09-19 NOTE — Telephone Encounter (Signed)
Patient called to advise that her blood pressure was 210/180 and that she had doubled her BP meds and it had no came down. I advised her to go to the ED per [facility].  ?

## 2021-09-19 NOTE — ED Notes (Signed)
Pt crying in pain, states lying flat on CT table made her head hurt worse.   ?EDP made aware ?

## 2021-09-19 NOTE — ED Notes (Signed)
Patient transported to CT 

## 2021-09-19 NOTE — ED Notes (Signed)
Bp still elevated, pt sleeping upon entry to room ?States her headache is better, EDP made aware ? ?

## 2021-09-19 NOTE — ED Notes (Signed)
Pt describes a HA for past 2 days with no relief from prescription and OTC meds.  Pt has hx of HTN and migraines.   ?

## 2021-09-19 NOTE — ED Triage Notes (Signed)
Patient here POV from Home. ? ?Endorses Headache that has been present for approximately 2 days. BP has been revolving approximately at 072-182 systolic.  ? ?No recent changes in Medication and BP has been mostly unresponsive to BP Medication. Headache has also been mostly unresponsive to OTC Medication.  ? ?NAD Noted during Triage. A&Ox4. GCS 15. Ambulatory. ?

## 2021-10-29 ENCOUNTER — Telehealth (INDEPENDENT_AMBULATORY_CARE_PROVIDER_SITE_OTHER): Payer: 59 | Admitting: Nurse Practitioner

## 2021-10-29 ENCOUNTER — Encounter (HOSPITAL_BASED_OUTPATIENT_CLINIC_OR_DEPARTMENT_OTHER): Payer: Self-pay | Admitting: Nurse Practitioner

## 2021-10-29 DIAGNOSIS — G6289 Other specified polyneuropathies: Secondary | ICD-10-CM | POA: Diagnosis not present

## 2021-10-29 DIAGNOSIS — G8929 Other chronic pain: Secondary | ICD-10-CM | POA: Diagnosis not present

## 2021-10-29 DIAGNOSIS — F5101 Primary insomnia: Secondary | ICD-10-CM | POA: Diagnosis not present

## 2021-10-29 DIAGNOSIS — K580 Irritable bowel syndrome with diarrhea: Secondary | ICD-10-CM | POA: Diagnosis not present

## 2021-10-29 DIAGNOSIS — R202 Paresthesia of skin: Secondary | ICD-10-CM

## 2021-10-29 DIAGNOSIS — M5441 Lumbago with sciatica, right side: Secondary | ICD-10-CM

## 2021-10-29 DIAGNOSIS — F410 Panic disorder [episodic paroxysmal anxiety] without agoraphobia: Secondary | ICD-10-CM

## 2021-10-29 DIAGNOSIS — F411 Generalized anxiety disorder: Secondary | ICD-10-CM

## 2021-10-29 DIAGNOSIS — M5442 Lumbago with sciatica, left side: Secondary | ICD-10-CM | POA: Diagnosis not present

## 2021-10-29 DIAGNOSIS — R69 Illness, unspecified: Secondary | ICD-10-CM | POA: Diagnosis not present

## 2021-10-29 MED ORDER — SERTRALINE HCL 50 MG PO TABS: ORAL_TABLET | ORAL | 3 refills | Status: DC

## 2021-10-29 MED ORDER — ALPRAZOLAM 0.25 MG PO TABS: 0.2500 mg | ORAL_TABLET | Freq: Two times a day (BID) | ORAL | 1 refills | Status: DC | PRN

## 2021-10-29 NOTE — Progress Notes (Signed)
Virtual Visit Encounter telephone visit.   I connected with  [name] on 11/05/21 at  1:10 PM EDT by secure audio telemedicine application. I verified that I am speaking with the correct person using two identifiers.   I introduced myself as a Designer, jewellery with the practice. The limitations of evaluation and management by telemedicine discussed with the patient and the availability of in person appointments. The patient expressed verbal understanding and consent to proceed.  Participating parties in this visit include: Myself and patient  The patient is: Patient Location: Home I am: Provider Location: Office/Clinic Subjective:    CC and HPI: [name] is a 46 y.o. year old female presenting for follow up of hives and insomnia. Patient reports the following: Insomnia and Anxiety  Has been under a tremendous amount of stress recently due to many issues  This is exacerbating her ability to sleep   She tells me that she often doesn't fall asleep until 3-4 in the morning She is seeing a therapist, [name], and she has recommended that she get started on medication to help with her stress.  She tells me that in the past she has had a severe "breakdown" where she experienced paralysis and visual hallucinations She feels that it is time to start on medication given her history and concern over this occurring again IBS symptoms have worsened with her anxiety  Neuropathy  Bilateral neuropathic pain up to calves in the legs.   Worsening with time  Also experiencing cramping in the feet She has had a nerve conduction study and was told there was no permanent damage, but the reason was unknown Her previous PCP planned an MRI of the back to monitor for worsening slipped discs.  Endorses the paresthesias are quite bothersome and very concerning for her She is ready to have imaging done, if possible   Past medical history, Surgical history, Family history not pertinant  except as noted below, Social history, Allergies, and medications have been entered into the medical record, reviewed, and corrections made.   Review of Systems:  All review of systems negative except what is listed in the HPI  Objective:    Alert and oriented x 4 Speaking in clear sentences with no shortness of breath. No distress.  Impression and Recommendations:    Problem List Items Addressed This Visit     Chronic low back pain    Chronic with paresthesias to the lower extremities. No saddle symptoms or other alarm sx present at this time. Hx of slipped discs remotely. Given that her symptoms are worsening with known injury, recommend repeat MRI of the lumbar spine to monitor for changes in the spine. Will f/u based on findings.       Relevant Medications   sertraline (ZOLOFT) 50 MG tablet   Other Relevant Orders   MR Lumbar Spine Wo Contrast   Generalized anxiety disorder    Chronic anxiety with difficulty controlling with therapy/counseling services only. Discussed medication options that may be helpful. Will start sertraline daily and add prn xanax to see if this is helpful to gain control of symptoms. No SI/HI present at this time. No hallucinations. Will monitor in 2-3 weeks or sooner if needed.       Relevant Medications   sertraline (ZOLOFT) 50 MG tablet   ALPRAZolam (XANAX) 0.25 MG tablet   Peripheral neuropathy    Bilateral neuropathic type pain in the LE with worsening symptoms. Patient has not had an MRI of the  back recently, but does have a history of known dysfunction of the lumbar spine. Given the worsening symptoms, I do feel that imaging is a reasonable next step. Nerve conduction shows no permanent damage, but no clear indication of the neuropathic pain at this time. Will send order for this for further evaluation given the worsening of symptoms that are present. Will plan f/u based on findings.       Relevant Medications   sertraline (ZOLOFT) 50 MG tablet    ALPRAZolam (XANAX) 0.25 MG tablet   Other Relevant Orders   MR Lumbar Spine Wo Contrast   Insomnia - Primary    Chronic. Previously doing well on ambien, but recent exacerbation of anxiety has caused a disruption in her sleep cycles. We will add medication today to try to get her anxiety symptoms under better control to see if this will help with her sleep. She is aware to not take xanax and ambien at the same time. She will let me know if she does not have improvement of her symptoms in the next week. Will f/u in 2-3 weeks.       Panic disorder    Significant increase in anxiety symptoms with multifactorial components. She is currently in counseling and I do feel that a combination of continued counseling with medication would be a good options for her at this time. She does not have any alarm symptoms at this time, but there is a remote history of severe exacerbation of panic symptoms with hallucinations, which is concerning. Discussion of medication options with patient today. We will trial sertraline and add xanax PRN for panic symptoms. She has been on ambien for sleep- patient aware that she should not take ambien and xanax at the same time or within 6 hours of eachother. Will continue to monitor symptoms and plan f/u in 2-3 weeks to see how she is doing.       Relevant Medications   sertraline (ZOLOFT) 50 MG tablet   ALPRAZolam (XANAX) 0.25 MG tablet   Paresthesia of bilateral legs    See peripheral neuropathy A&P      Irritable bowel syndrome with diarrhea    orders and follow up as documented in EMR I discussed the assessment and treatment plan with the patient. The patient was provided an opportunity to ask questions and all were answered. The patient agreed with the plan and demonstrated an understanding of the instructions.   The patient was advised to call back or seek an in-person evaluation if the symptoms worsen or if the condition fails to improve as  anticipated.  Follow-Up: 2-3 weeks phone call for mood  I provided 26 minutes of non-face-to-face interaction with this non face-to-face encounter including intake, same-day documentation, and chart review.   Orma Render, NP , DNP, AGNP-c [place] [facility] [phone] (fax)

## 2021-10-29 NOTE — Patient Instructions (Signed)
I have sent in medication to the pharmacy for you.   I will get the MRI of the back sent in so we can see if there is something going on in the spine that could be causing the neuropathy.

## 2021-11-05 DIAGNOSIS — R202 Paresthesia of skin: Secondary | ICD-10-CM | POA: Insufficient documentation

## 2021-11-05 NOTE — Assessment & Plan Note (Signed)
Chronic. Previously doing well on ambien, but recent exacerbation of anxiety has caused a disruption in her sleep cycles. We will add medication today to try to get her anxiety symptoms under better control to see if this will help with her sleep. She is aware to not take xanax and ambien at the same time. She will let me know if she does not have improvement of her symptoms in the next week. Will f/u in 2-3 weeks.

## 2021-11-05 NOTE — Assessment & Plan Note (Signed)
Chronic with paresthesias to the lower extremities. No saddle symptoms or other alarm sx present at this time. Hx of slipped discs remotely. Given that her symptoms are worsening with known injury, recommend repeat MRI of the lumbar spine to monitor for changes in the spine. Will f/u based on findings.

## 2021-11-05 NOTE — Assessment & Plan Note (Signed)
Chronic anxiety with difficulty controlling with therapy/counseling services only. Discussed medication options that may be helpful. Will start sertraline daily and add prn xanax to see if this is helpful to gain control of symptoms. No SI/HI present at this time. No hallucinations. Will monitor in 2-3 weeks or sooner if needed.

## 2021-11-05 NOTE — Assessment & Plan Note (Addendum)
Bilateral neuropathic type pain in the LE with worsening symptoms. Patient has not had an MRI of the back recently, but does have a history of known dysfunction of the lumbar spine. Given the worsening symptoms, I do feel that imaging is a reasonable next step. Nerve conduction shows no permanent damage, but no clear indication of the neuropathic pain at this time. Will send order for this for further evaluation given the worsening of symptoms that are present. Will plan f/u based on findings.

## 2021-11-05 NOTE — Assessment & Plan Note (Addendum)
Significant increase in anxiety symptoms with multifactorial components. She is currently in counseling and I do feel that a combination of continued counseling with medication would be a good options for her at this time. She does not have any alarm symptoms at this time, but there is a remote history of severe exacerbation of panic symptoms with hallucinations, which is concerning. Discussion of medication options with patient today. We will trial sertraline and add xanax PRN for panic symptoms. She has been on ambien for sleep- patient aware that she should not take ambien and xanax at the same time or within 6 hours of eachother. Will continue to monitor symptoms and plan f/u in 2-3 weeks to see how she is doing.

## 2021-11-05 NOTE — Assessment & Plan Note (Signed)
See peripheral neuropathy A&P

## 2021-11-06 DIAGNOSIS — H9191 Unspecified hearing loss, right ear: Secondary | ICD-10-CM | POA: Diagnosis not present

## 2021-11-06 DIAGNOSIS — D333 Benign neoplasm of cranial nerves: Secondary | ICD-10-CM | POA: Diagnosis not present

## 2021-11-06 DIAGNOSIS — H903 Sensorineural hearing loss, bilateral: Secondary | ICD-10-CM | POA: Diagnosis not present

## 2021-11-06 DIAGNOSIS — R69 Illness, unspecified: Secondary | ICD-10-CM | POA: Diagnosis not present

## 2021-11-07 ENCOUNTER — Telehealth: Payer: 59 | Admitting: Nurse Practitioner

## 2021-11-07 ENCOUNTER — Telehealth (HOSPITAL_BASED_OUTPATIENT_CLINIC_OR_DEPARTMENT_OTHER): Payer: Self-pay

## 2021-11-07 DIAGNOSIS — J40 Bronchitis, not specified as acute or chronic: Secondary | ICD-10-CM | POA: Diagnosis not present

## 2021-11-07 MED ORDER — DOXYCYCLINE HYCLATE 100 MG PO TABS: 100.0000 mg | ORAL_TABLET | Freq: Two times a day (BID) | ORAL | 0 refills | Status: AC

## 2021-11-07 MED ORDER — PREDNISONE 20 MG PO TABS
20.0000 mg | ORAL_TABLET | Freq: Two times a day (BID) | ORAL | 0 refills | Status: AC
Start: 2021-11-07 — End: 2021-11-12

## 2021-11-07 MED ORDER — ALBUTEROL SULFATE HFA 108 (90 BASE) MCG/ACT IN AERS: 2.0000 | INHALATION_SPRAY | Freq: Four times a day (QID) | RESPIRATORY_TRACT | 0 refills | Status: AC | PRN

## 2021-11-07 NOTE — Progress Notes (Signed)
Virtual Visit Consent   EVERLEIGH COLCLASURE, you are scheduled for a virtual visit with a [facility] provider today. Just as with appointments in the office, your consent must be obtained to participate. Your consent will be active for this visit and any virtual visit you may have with one of our providers in the next 365 days. If you have a MyChart account, a copy of this consent can be sent to you electronically.  As this is a virtual visit, video technology does not allow for your provider to perform a traditional examination. This may limit your provider's ability to fully assess your condition. If your provider identifies any concerns that need to be evaluated in person or the need to arrange testing (such as labs, EKG, etc.), we will make arrangements to do so. Although advances in technology are sophisticated, we cannot ensure that it will always work on either your end or our end. If the connection with a video visit is poor, the visit may have to be switched to a telephone visit. With either a video or telephone visit, we are not always able to ensure that we have a secure connection.  By engaging in this virtual visit, you consent to the provision of healthcare and authorize for your insurance to be billed (if applicable) for the services provided during this visit. Depending on your insurance coverage, you may receive a charge related to this service.  I need to obtain your verbal consent now. Are you willing to proceed with your visit today? JORDYAN HARDIMAN has provided verbal consent on 11/07/2021 for a virtual visit (video or telephone). Apolonio Schneiders, FNP  Date: 11/07/2021 4:57 PM  Virtual Visit via Video Note   I, Apolonio Schneiders, connected with  GWENYTH DINGEE  ([id number], 1975-08-26) on 11/07/21 at  5:00 PM EDT by a video-enabled telemedicine application and verified that I am speaking with the correct person using two identifiers.  Location: Patient: Virtual Visit Location Patient:  Home Provider: Virtual Visit Location Provider: Home Office   I discussed the limitations of evaluation and management by telemedicine and the availability of in person appointments. The patient expressed understanding and agreed to proceed.    History of Present Illness: [name] is a 46 y.o. who identifies as a female who was assigned female at birth, and is being seen today for cough and wheezing. She used her ProAir this morning with some relief, just used it again a few minutes ago and does not feel she is getting relief.   She has had a cough for the past few weeks that originated as a cold that lead to chest congestion.   She feels like she has congestion in her chest but her cough is dry.   She denies a fever.  She was at ENT two days prior  Problems:  Patient Active Problem List   Diagnosis Date Noted   Paresthesia of bilateral legs 11/05/2021   Panic disorder 10/29/2021   Vestibular schwannoma ([place]) 08/16/2021   Insomnia 07/31/2021   Intractable headache 07/03/2021   TMJ syndrome 06/18/2021   B12 deficiency 01/24/2021   Corneal abrasion 11/25/2020   Elevated MCV 11/25/2020   Visual changes 11/25/2020   Migraine 11/15/2020   Elevated liver enzymes 11/02/2020   Peripheral neuropathy 10/11/2020   Irritable bowel syndrome with diarrhea 02/02/2018   Generalized anxiety disorder 02/02/2018   Perirectal abscess 10/28/2016   Chronic fatigue 01/09/2016   Adrenal nodule ([place]) 01/09/2016   Chronic low back  pain 11/21/2015   Elevated lipase 12/14/2014   AP (abdominal pain) 12/14/2014   Nausea and vomiting 11/29/2014   Intractable abdominal pain 11/28/2014   Hypokalemia 11/28/2014   HTN (hypertension) 11/28/2014   Pilonidal abscess 12/03/2013   Patellar instability of left knee 06/08/2013    Allergies:  Allergies  Allergen Reactions   Penicillins Anaphylaxis, Swelling and Rash    Face and throat swell   Medications:  Current Outpatient Medications:     ALPRAZolam (XANAX) 0.25 MG tablet, Take 1 tablet (0.25 mg total) by mouth 2 (two) times daily as needed for anxiety., Disp: 45 tablet, Rfl: 1   amitriptyline (ELAVIL) 50 MG tablet, Take 1 tablet (50 mg total) by mouth at bedtime., Disp: 90 tablet, Rfl: 4   carbamazepine (TEGRETOL-XR) 200 MG 12 hr tablet, Take 1 tablet (200 mg total) by mouth 2 (two) times daily., Disp: 60 tablet, Rfl: 01   cyclobenzaprine (FLEXERIL) 10 MG tablet, Take 1 tablet (10 mg total) by mouth 3 (three) times daily as needed for muscle spasms (jaw pain)., Disp: 30 tablet, Rfl: 1   gabapentin (NEURONTIN) 300 MG capsule, Take 2 capsules (600 mg total) by mouth 4 (four) times daily., Disp: 270 capsule, Rfl: 11   ibuprofen (ADVIL) 600 MG tablet, Take 1 tablet (600 mg total) by mouth every 8 (eight) hours as needed., Disp: 30 tablet, Rfl: 0   olmesartan-hydrochlorothiazide (BENICAR HCT) 40-12.5 MG tablet, Take 2 tablets by mouth daily., Disp: 180 tablet, Rfl: 1   Rimegepant Sulfate (NURTEC) 75 MG TBDP, Take 75 mg by mouth daily as needed (migraine)., Disp: 15 tablet, Rfl: 3   sertraline (ZOLOFT) 50 MG tablet, Take 1/2 tab by mouth at bedtime for 6 days then increase to 1 tab by mouth at bedtime., Disp: 30 tablet, Rfl: 3   Vitamin D, Ergocalciferol, (DRISDOL) 1.25 MG (50000 UNIT) CAPS capsule, Take 1 capsule (50,000 Units total) by mouth every 7 (seven) days for 20 doses., Disp: 20 capsule, Rfl: 0   zolpidem (AMBIEN) 5 MG tablet, Take 1 tablet (5 mg total) by mouth at bedtime as needed for sleep., Disp: 30 tablet, Rfl: 1  Observations/Objective: Patient is well-developed, well-nourished in no acute distress.  Resting comfortably at home.  Head is normocephalic, atraumatic.  No labored breathing.  Speech is clear and coherent with logical content.  Patient is alert and oriented at baseline.  Course cough witnessed no acute distress   Assessment and Plan: 1. Bronchitis Use Mucinex OTC to help with congestion and cough   -  predniSONE (DELTASONE) 20 MG tablet; Take 1 tablet (20 mg total) by mouth 2 (two) times daily with a meal for 5 days.  Dispense: 10 tablet; Refill: 0 - doxycycline (VIBRA-TABS) 100 MG tablet; Take 1 tablet (100 mg total) by mouth 2 (two) times daily for 7 days.  Dispense: 14 tablet; Refill: 0 - albuterol (VENTOLIN HFA) 108 (90 Base) MCG/ACT inhaler; Inhale 2 puffs into the lungs every 6 (six) hours as needed for wheezing or shortness of breath.  Dispense: 8 g; Refill: 0     Follow Up Instructions: I discussed the assessment and treatment plan with the patient. The patient was provided an opportunity to ask questions and all were answered. The patient agreed with the plan and demonstrated an understanding of the instructions.  A copy of instructions were sent to the patient via MyChart unless otherwise noted below.    The patient was advised to call back or seek an in-person evaluation if the symptoms  worsen or if the condition fails to improve as anticipated.  Time:  I spent 10 minutes with the patient via telehealth technology discussing the above problems/concerns.    Apolonio Schneiders, FNP

## 2021-11-07 NOTE — Telephone Encounter (Signed)
Spoke with [name] D @ [facility] [phone] auth # [id number] for MRI Lumbar.

## 2021-11-10 ENCOUNTER — Other Ambulatory Visit: Payer: 59

## 2021-11-15 DIAGNOSIS — H9041 Sensorineural hearing loss, unilateral, right ear, with unrestricted hearing on the contralateral side: Secondary | ICD-10-CM | POA: Diagnosis not present

## 2021-11-15 DIAGNOSIS — G8929 Other chronic pain: Secondary | ICD-10-CM | POA: Diagnosis not present

## 2021-11-15 DIAGNOSIS — Z880 Allergy status to penicillin: Secondary | ICD-10-CM | POA: Diagnosis not present

## 2021-11-15 DIAGNOSIS — R519 Headache, unspecified: Secondary | ICD-10-CM | POA: Diagnosis not present

## 2021-11-15 DIAGNOSIS — D333 Benign neoplasm of cranial nerves: Secondary | ICD-10-CM | POA: Diagnosis not present

## 2021-11-15 DIAGNOSIS — Q165 Congenital malformation of inner ear: Secondary | ICD-10-CM | POA: Diagnosis not present

## 2021-11-16 ENCOUNTER — Telehealth (HOSPITAL_BASED_OUTPATIENT_CLINIC_OR_DEPARTMENT_OTHER): Payer: Self-pay | Admitting: Nurse Practitioner

## 2021-11-16 NOTE — Telephone Encounter (Signed)
Received a fax from [facility] requesting medical records for pt. Sent over on 11/16/21.

## 2021-11-26 ENCOUNTER — Ambulatory Visit (HOSPITAL_BASED_OUTPATIENT_CLINIC_OR_DEPARTMENT_OTHER): Payer: 59

## 2021-11-26 ENCOUNTER — Ambulatory Visit (HOSPITAL_BASED_OUTPATIENT_CLINIC_OR_DEPARTMENT_OTHER): Payer: 59 | Admitting: Nurse Practitioner

## 2021-12-27 ENCOUNTER — Telehealth (HOSPITAL_BASED_OUTPATIENT_CLINIC_OR_DEPARTMENT_OTHER): Payer: Self-pay | Admitting: Nurse Practitioner

## 2021-12-27 NOTE — Telephone Encounter (Signed)
Received fax from [facility] regarding imaging denial. Paperwork is in providers yellow tray for CMA. Please advise.

## 2022-04-05 ENCOUNTER — Emergency Department (HOSPITAL_BASED_OUTPATIENT_CLINIC_OR_DEPARTMENT_OTHER)
Admission: EM | Admit: 2022-04-05 | Discharge: 2022-04-05 | Disposition: A | Discharge status: Home / Self Care | Payer: 59 | Attending: Emergency Medicine | Admitting: Emergency Medicine

## 2022-04-05 ENCOUNTER — Other Ambulatory Visit: Payer: Self-pay

## 2022-04-05 ENCOUNTER — Encounter (HOSPITAL_BASED_OUTPATIENT_CLINIC_OR_DEPARTMENT_OTHER): Payer: Self-pay | Admitting: Emergency Medicine

## 2022-04-05 DIAGNOSIS — L02412 Cutaneous abscess of left axilla: Secondary | ICD-10-CM | POA: Insufficient documentation

## 2022-04-05 MED ORDER — DOXYCYCLINE HYCLATE 100 MG PO CAPS: 100.0000 mg | ORAL_CAPSULE | Freq: Two times a day (BID) | ORAL | 0 refills | Status: AC

## 2022-04-05 MED ORDER — HYDROMORPHONE HCL 1 MG/ML IJ SOLN
1.0000 mg | Freq: Once | INTRAMUSCULAR | Status: AC
Start: 2022-04-05 — End: 2022-04-05
  Administered 2022-04-05: 1 mg via INTRAMUSCULAR
  Filled 2022-04-05: qty 1

## 2022-04-05 MED ORDER — OXYCODONE-ACETAMINOPHEN 5-325 MG PO TABS
1.0000 | ORAL_TABLET | Freq: Once | ORAL | Status: DC
  Filled 2022-04-05: qty 1

## 2022-04-05 NOTE — Discharge Instructions (Addendum)
You will be sent a prescription for doxycycline, take as directed.  You may take over-the-counter 600 mg ibuprofen every 6 hours and alternate with 500 mg Tylenol every 6 hours as needed for pain.  Place a warm compress to the affected area every 15 minutes as needed for symptoms.  Attached is information for the on-call surgeon, you may call and set up a follow-up appointment regarding today's ED visit.  Follow-up with your primary care provider regarding today's ED visit.  Return to the emergency department for experiencing increasing/worsening symptoms.
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
## 2022-04-05 NOTE — ED Triage Notes (Signed)
Patient c/o abscess to left axilla. States it started draining last night, but the drainage has stopped and she has had increased pain.

## 2022-07-19 ENCOUNTER — Encounter (HOSPITAL_COMMUNITY): Payer: Self-pay | Admitting: Emergency Medicine

## 2022-07-19 ENCOUNTER — Other Ambulatory Visit: Payer: Self-pay

## 2022-07-19 ENCOUNTER — Other Ambulatory Visit (HOSPITAL_COMMUNITY): Payer: Medicaid Other

## 2022-07-19 ENCOUNTER — Emergency Department (HOSPITAL_COMMUNITY): Payer: Medicaid Other

## 2022-07-19 ENCOUNTER — Emergency Department (HOSPITAL_COMMUNITY)
Admission: EM | Admit: 2022-07-19 | Discharge: 2022-07-19 | Payer: Medicaid Other | Attending: Emergency Medicine | Admitting: Emergency Medicine

## 2022-07-19 DIAGNOSIS — Y906 Blood alcohol level of 120-199 mg/100 ml: Secondary | ICD-10-CM | POA: Insufficient documentation

## 2022-07-19 DIAGNOSIS — R45851 Suicidal ideations: Secondary | ICD-10-CM

## 2022-07-19 DIAGNOSIS — E876 Hypokalemia: Secondary | ICD-10-CM | POA: Insufficient documentation

## 2022-07-19 DIAGNOSIS — F1090 Alcohol use, unspecified, uncomplicated: Secondary | ICD-10-CM | POA: Diagnosis not present

## 2022-07-19 DIAGNOSIS — T1491XA Suicide attempt, initial encounter: Secondary | ICD-10-CM | POA: Diagnosis not present

## 2022-07-19 DIAGNOSIS — T50902A Poisoning by unspecified drugs, medicaments and biological substances, intentional self-harm, initial encounter: Secondary | ICD-10-CM | POA: Insufficient documentation

## 2022-07-19 LAB — RAPID URINE DRUG SCREEN, HOSP PERFORMED
Amphetamines: NOT DETECTED
Barbiturates: NOT DETECTED
Benzodiazepines: POSITIVE — AB
Cocaine: NOT DETECTED
Opiates: NOT DETECTED
Tetrahydrocannabinol: POSITIVE — AB

## 2022-07-19 LAB — COMPREHENSIVE METABOLIC PANEL
ALT: 129 U/L — ABNORMAL HIGH (ref 0–44)
ALT: 122 U/L — ABNORMAL HIGH (ref 0–44)
AST: 138 U/L — ABNORMAL HIGH (ref 15–41)
AST: 137 U/L — ABNORMAL HIGH (ref 15–41)
Albumin: 2.9 g/dL — ABNORMAL LOW (ref 3.5–5.0)
Albumin: 2.6 g/dL — ABNORMAL LOW (ref 3.5–5.0)
Alkaline Phosphatase: 170 U/L — ABNORMAL HIGH (ref 38–126)
Alkaline Phosphatase: 156 U/L — ABNORMAL HIGH (ref 38–126)
Anion gap: 17 — ABNORMAL HIGH (ref 5–15)
Anion gap: 15 (ref 5–15)
BUN: 5 mg/dL — ABNORMAL LOW (ref 6–20)
BUN: <5 mg/dL — ABNORMAL LOW (ref 6–20)
CO2: 22 mmol/L (ref 22–32)
CO2: 25 mmol/L (ref 22–32)
Calcium: 8.3 mg/dL — ABNORMAL LOW (ref 8.9–10.3)
Calcium: 8.1 mg/dL — ABNORMAL LOW (ref 8.9–10.3)
Chloride: 105 mmol/L (ref 98–111)
Chloride: 104 mmol/L (ref 98–111)
Creatinine, Ser: 0.84 mg/dL (ref 0.44–1.00)
Creatinine, Ser: 0.79 mg/dL (ref 0.44–1.00)
GFR, Estimated: >60 mL/min (ref >60)
GFR, Estimated: >60 mL/min (ref >60)
Glucose, Bld: 76 mg/dL (ref 70–99)
Glucose, Bld: 77 mg/dL (ref 70–99)
Potassium: 3.5 mmol/L (ref 3.5–5.1)
Potassium: 2.5 mmol/L — CL (ref 3.5–5.1)
Sodium: 144 mmol/L (ref 135–145)
Sodium: 144 mmol/L (ref 135–145)
Total Bilirubin: 1.3 mg/dL — ABNORMAL HIGH (ref 0.3–1.2)
Total Bilirubin: 0.7 mg/dL (ref 0.3–1.2)
Total Protein: 6 g/dL — ABNORMAL LOW (ref 6.5–8.1)
Total Protein: 5.7 g/dL — ABNORMAL LOW (ref 6.5–8.1)

## 2022-07-19 LAB — MAGNESIUM: Magnesium: 1.8 mg/dL (ref 1.7–2.4)

## 2022-07-19 LAB — CBG MONITORING, ED: Glucose-Capillary: 87 mg/dL (ref 70–99)

## 2022-07-19 LAB — CBC WITH DIFFERENTIAL/PLATELET
Abs Immature Granulocytes: 0.02 10*3/uL (ref 0.00–0.07)
Basophils Absolute: 0 10*3/uL (ref 0.0–0.1)
Basophils Relative: 0 %
Eosinophils Absolute: 0 10*3/uL (ref 0.0–0.5)
Eosinophils Relative: 0 %
HCT: 48.4 % — ABNORMAL HIGH (ref 36.0–46.0)
Hemoglobin: 15.7 g/dL — ABNORMAL HIGH (ref 12.0–15.0)
Immature Granulocytes: 0 %
Lymphocytes Relative: 48 %
Lymphs Abs: 2.2 10*3/uL (ref 0.7–4.0)
MCH: 34 pg (ref 26.0–34.0)
MCHC: 32.4 g/dL (ref 30.0–36.0)
MCV: 104.8 fL — ABNORMAL HIGH (ref 80.0–100.0)
Monocytes Absolute: 0.3 10*3/uL (ref 0.1–1.0)
Monocytes Relative: 7 %
Neutro Abs: 2.1 10*3/uL (ref 1.7–7.7)
Neutrophils Relative %: 45 %
Platelets: 213 10*3/uL (ref 150–400)
RBC: 4.62 MIL/uL (ref 3.87–5.11)
RDW: 15.8 % — ABNORMAL HIGH (ref 11.5–15.5)
WBC: 4.7 10*3/uL (ref 4.0–10.5)
nRBC: 0 % (ref 0.0–0.2)

## 2022-07-19 LAB — I-STAT BETA HCG BLOOD, ED (MC, WL, AP ONLY): I-stat hCG, quantitative: <5 m[IU]/mL (ref <5)

## 2022-07-19 LAB — POTASSIUM: Potassium: 3.8 mmol/L (ref 3.5–5.1)

## 2022-07-19 LAB — SALICYLATE LEVEL: Salicylate Lvl: <7 mg/dL — ABNORMAL LOW (ref 7.0–30.0)

## 2022-07-19 LAB — ETHANOL: Alcohol, Ethyl (B): 174 mg/dL — ABNORMAL HIGH (ref <10)

## 2022-07-19 LAB — ACETAMINOPHEN LEVEL
Acetaminophen (Tylenol), Serum: <10 ug/mL — ABNORMAL LOW (ref 10–30)
Acetaminophen (Tylenol), Serum: <10 ug/mL — ABNORMAL LOW (ref 10–30)

## 2022-07-19 MED ORDER — POTASSIUM CHLORIDE CRYS ER 20 MEQ PO TBCR
40.0000 meq | EXTENDED_RELEASE_TABLET | Freq: Once | ORAL | Status: AC
  Administered 2022-07-19: 40 meq via ORAL
  Filled 2022-07-19: qty 2

## 2022-07-19 MED ORDER — POTASSIUM CHLORIDE 10 MEQ/100ML IV SOLN
10.0000 meq | INTRAVENOUS | Status: AC
  Administered 2022-07-19 (×3): 10 meq via INTRAVENOUS
  Filled 2022-07-19 (×3): qty 100

## 2022-07-19 NOTE — Progress Notes (Signed)
Pt was accepted to [facility] 07/19/2022, pending IVC paperwork faxed to [phone]. Bed assignment: [facility]  Pt meets inpatient criteria per Alver Sorrow, NP  Attending Physician will be Dr. Franchot Mimes, MD  Report can be called to: [phone] (please do not call report until transportation has arrived)  Pt can arrive after pending items are received; bed is ready now  Care Team Notified: Alver Sorrow, NP, Nichola Sizer, RN, and Scharlene Gloss, RN  [place], [place]  07/19/2022 4:06 PM

## 2022-07-19 NOTE — ED Notes (Signed)
Unable to complete Psych assessment patient uncooperative with staff

## 2022-07-19 NOTE — ED Notes (Signed)
When this EMT went to introduce herself to the patient, the patient had the in room phone attempting to call her spouse. This EMT informed that she had already tried to call her spouse that she would have to wait to call later. The patient wanted to call him to ask him if he was coming to pick her up. This EMT informed the patient that she was under IVC orders and that she was waiting to talk to the psychiatric team. The patient kept asking about her phone and keys. This EMT informed the patient that if she had values that they were secured with security. The patient attempted to call the spouse one last time with no success. This EMT then removed the  phone from the room.  The patient is resting at this time.

## 2022-07-19 NOTE — ED Notes (Signed)
Patient wanded by security upon admission to [facility].

## 2022-07-19 NOTE — ED Provider Notes (Signed)
  Physical Exam  BP (!) 145/101   Pulse 83   Temp 98.2 F (36.8 C) (Oral)   Resp 18   Ht 6' (1.829 m)   Wt 90.7 kg   SpO2 98%   BMI 27.12 kg/m   Physical Exam Vitals and nursing note reviewed.  Constitutional:      Appearance: Normal appearance.  HENT:     Head: Normocephalic and atraumatic.     Mouth/Throat:     Mouth: Mucous membranes are moist.  Eyes:     Pupils: Pupils are equal, round, and reactive to light.  Cardiovascular:     Rate and Rhythm: Normal rate.  Pulmonary:     Effort: Pulmonary effort is normal.  Abdominal:     General: Abdomen is flat.  Musculoskeletal:     Cervical back: Normal range of motion and neck supple.  Skin:    General: Skin is warm and dry.  Neurological:     Mental Status: She is alert and oriented to person, place, and time.     Procedures  Procedures  ED Course / MDM   Clinical Course as of 07/19/22 1510  Fri Jul 19, 2022  1414 Benzodiazepines(!): POSITIVE [JS]  1414 Tetrahydrocannabinol(!): POSITIVE [JS]    Clinical Course User Index [JS] Janeece Fitting, PA-C   Medical Decision Making Amount and/or Complexity of Data Reviewed Labs: ordered. Decision-making details documented in ED Course. Radiology: ordered.  Risk Prescription drug management.  Patient care assumed from Beverely Risen. PA at shift change, please see her note for her full HPI. Briefly, patient here s/p perhaps overdose.  Patient did take a lot of medications in order to go to sleep she states, she was IVC by prior provider as patient stated that she did not want her blood drawn.  Patient was updated this morning around 7 AM, she is cooperative, she is pending TTS consultation along with potassium replacement.  She reports longstanding history of hypokalemia due to prior gastric bypass surgery. Plan is for pending TTS.   3:10 PM patient has been medically cleared by me.  Evaluated by TTS and they recommend inpatient psychiatric admission.   Treatment Plan  Summary: Plan : Patient will be admitted into psychiatric facility for treatment and stabilization   Disposition: Recommend psychiatric Inpatient admission when medically cleared.  Portions of this note were generated with Lobbyist. Dictation errors may occur despite best attempts at proofreading.         Janeece Fitting, PA-C 07/19/22 1510    Ripley Fraise, MD 07/22/22 [phone]

## 2022-07-19 NOTE — ED Notes (Signed)
Patient awake and alert this morning, states she does not remember the events that took place last night, has multiple questions, no s/s of distress, sitter present at the door, will continue to monitor.

## 2022-07-19 NOTE — ED Notes (Signed)
RN given patient belongings and informed by previous RN Leafy Ro that patient medications had previously been held by the provider. Upon receipt of the medications this RN and nurse tech witnessed medications with the xanax bottle empty and other pill bottles containing medications.
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
## 2022-07-19 NOTE — ED Notes (Signed)
Patient uncooperative with staff. Staff attempting to obtain IV access for ordered labs. MD notified.

## 2022-07-19 NOTE — ED Notes (Addendum)
PA has bag with pt medication and phones

## 2022-07-19 NOTE — Consult Note (Signed)
[facility] Consult   Reason for Consult: SI, overdose Referring Physician:  Tacy Learn, PA-C   Patient Identification: [name] MRN:  [id number] Principal Diagnosis: Suicidal ideation Diagnosis:  Principal Problem:   Suicidal ideation Active Problems:   Intentional overdose ([place])   Total Time spent with patient: 45 minutes  Subjective:   [name] is a 47 y.o. female patient admitted with Suicidal ideation and intentional overdose.  HPI: [name] is a 47 year old female with a history of PTSD and GAD, was brought to [facility] by EMS after they were called due to patient taking a handful of medications.  Patient was seen face to face by this provider, consulted with Dr Melba Coon and chart reviewed.   On evaluation patient is alert and oriented x3, speech is clear and coherent. Patient's eye contact is good, mood is dysphoric, affect is congruent.  Patient's thought process is goal directed and thought content is within normal limits. Patient denies SI HI, denies AVH, or paranoia. Patient is not responding to internal stimuli and no delusion noted.    During assessment today, patient was found on the bed, sleeping but got up for assessment. Patient stated "I do not know, I was tired".  Patient is not forthcoming with information.  Patient reported that she had an argument with her husband yesterday and she got so tired about it. Patient reported having anxiety and takes prescribed Xanax as needed. Patient denies abusing Xanax, patient denies taking any other medications. Patient reported that she could not find her medication last night and she took 2 shots of liquor. Patient denies depression, she denies suicidal ideation. Patient denies intentional overdose. Patient reported that she has a gun at home. 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 Patient will not provide further information during this assessment.  Patient gave permission for this  provider to call her husband [name] [id number]).  Additional information was obtained from [place]. Shanon Brow reported that their marriage is not working out so they had a bad argument yesterday and he left the house. He said he left the house after she had an outrage. Shanon Brow said Arely has been voicing that she wants to die but she does not tell him of any plan. Shanon Brow reported that [place] has a history of intentional overdose with pills and alcohol in 2021, he said she was hospitalized after the incident. He said [place] had therapy sessions after the incident but not currently. He said [place] does not have a psychiatrist.  Shanon Brow reported that she has some prescribed medications she takes but unsure of the names or why she takes them.    Past Psychiatric History: PTSD, GAD  Risk to Self: Yes Risk to Others: No Prior Inpatient Therapy: Yes Prior Outpatient Therapy: No  Past Medical History:  Past Medical History:  Diagnosis Date   Adnexal cyst 2013   right   Anemia 2015   Pernicious and iron deficiency. As of 2015/2016 no longer requiring po iron or B12 shots.    Anxiety 2020   Anxiety and ptsd   Chondromalacia 2005   left knee. arthroscopy x 2.    Colitis    COPD (chronic obstructive pulmonary disease) ([place]) 2018   Headache(784.0)    migraines   Hydradenitis 2001   axillary, multiple surgical I & Ds/excisions.   Hypertension    Nausea & vomiting 11/28/2014   Neuromuscular disorder ([place]) 2020   Peripheral neuropathy   Obesity (BMI 30.0-34.9)  Pilonidal cyst    Pityriasis rosea    pt also gives hx of seborrheic dermatitis.     Past Surgical History:  Procedure Laterality Date   ABDOMINAL HYSTERECTOMY     CYSTECTOMY     2004 axilla bil   ESOPHAGOGASTRODUODENOSCOPY N/A 11/30/2014   Procedure: ESOPHAGOGASTRODUODENOSCOPY (EGD);  Surgeon: Jerene Bears, MD;  Location: [facility] ENDOSCOPY;  Service: Endoscopy;  Laterality: N/A;   FRACTURE SURGERY     rt arm   GASTRIC BYPASS   05/20/2009   gastric sleeve   KNEE SURGERY  05/21/2003   left   LAPAROSCOPIC CHOLECYSTECTOMY  05/20/2000   MEDIAL PATELLOFEMORAL LIGAMENT REPAIR Left 06/08/2013   Procedure: DIAGNOSTIC OPERATIVE ARTHROSCOPY, OPEN MEDIAL PATELLA FEMORAL LIGAMENT RECONSTRUCTION;  Surgeon: Meredith Pel, MD;  Location: [facility];  Service: Orthopedics;  Laterality: Left;  with Hamstring autograft   OVARIAN CYST REMOVAL     PARTIAL HYSTERECTOMY     R arm surgery     Family History:  Family History  Problem Relation Age of Onset   Lupus Mother    Stroke Mother    Hypertension Mother    Arthritis Mother    Crohn's disease Father    Hypertension Father    Gallbladder disease Father    Cancer Maternal Grandfather        lung   COPD Maternal Grandfather    Emphysema Maternal Grandfather    Lung cancer Maternal Grandmother    Stroke Maternal Grandmother    Cancer Maternal Grandmother    Coronary artery disease Paternal Grandfather    Heart disease Paternal Grandfather    Stroke Paternal Grandmother    Family Psychiatric  History: Not provided Social History:  Social History   Substance and Sexual Activity  Alcohol Use Yes   Alcohol/week: 1.0 - 2.0 standard drink of alcohol   Types: 1 - 2 Standard drinks or equivalent per week   Comment: occ     Social History   Substance and Sexual Activity  Drug Use Not Currently   Types: Benzodiazepines, Other-see comments   Comment: Cbd and anxiety meds    Social History   Socioeconomic History   Marital status: Single    Spouse name: Not on file   Number of children: Not on file   Years of education: Not on file   Highest education level: Not on file  Occupational History   Occupation: Insurance account manager: [employer]    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   Tobacco Use   Smoking status: Every Day    Packs/day: 0.50    Years: 2.00    Total pack years: 1.00    Types: Cigarettes    Start date: 05/20/2013    Smokeless tobacco: Never  Vaping Use   Vaping Use: Some days   Substances: Nicotine, Flavoring  Substance and Sexual Activity   Alcohol use: Yes    Alcohol/week: 1.0 - 2.0 standard drink of alcohol    Types: 1 - 2 Standard drinks or equivalent per week    Comment: occ   Drug use: Not Currently    Types: Benzodiazepines, Other-see comments    Comment: Cbd and anxiety meds   Sexual activity: Yes    Partners: Male    Birth control/protection: Surgical  Other Topics Concern   Not on file  Social History Narrative   Not on file   Social Determinants of Health   Financial Resource Strain: Not on file  Food Insecurity: Not  on file  Transportation Needs: Not on file  Physical Activity: Not on file  Stress: Not on file  Social Connections: Not on file   Additional Social History:    Allergies:   Allergies  Allergen Reactions   Penicillins Anaphylaxis, Swelling and Rash    Face and throat swell    Labs:  Results for orders placed or performed during the hospital encounter of 07/19/22 (from the past 48 hour(s))  CBG monitoring, ED     Status: None   Collection Time: 07/19/22  2:29 AM  Result Value Ref Range   Glucose-Capillary 87 70 - 99 mg/dL    Comment: Glucose reference range applies only to samples taken after fasting for at least 8 hours.   Comment 1 Notify RN    Comment 2 Document in Chart   Comprehensive metabolic panel     Status: Abnormal   Collection Time: 07/19/22  2:30 AM  Result Value Ref Range   Sodium 144 135 - 145 mmol/L   Potassium 3.5 3.5 - 5.1 mmol/L    Comment: HEMOLYSIS AT THIS LEVEL MAY AFFECT RESULT   Chloride 105 98 - 111 mmol/L   CO2 22 22 - 32 mmol/L   Glucose, Bld 76 70 - 99 mg/dL    Comment: Glucose reference range applies only to samples taken after fasting for at least 8 hours.   BUN 5 (L) 6 - 20 mg/dL   Creatinine, Ser 0.84 0.44 - 1.00 mg/dL   Calcium 8.3 (L) 8.9 - 10.3 mg/dL   Total Protein 6.0 (L) 6.5 - 8.1 g/dL   Albumin 2.9 (L) 3.5  - 5.0 g/dL   AST 138 (H) 15 - 41 U/L    Comment: HEMOLYSIS AT THIS LEVEL MAY AFFECT RESULT   ALT 129 (H) 0 - 44 U/L    Comment: HEMOLYSIS AT THIS LEVEL MAY AFFECT RESULT   Alkaline Phosphatase 170 (H) 38 - 126 U/L   Total Bilirubin 1.3 (H) 0.3 - 1.2 mg/dL    Comment: HEMOLYSIS AT THIS LEVEL MAY AFFECT RESULT   GFR, Estimated >60 >60 mL/min    Comment: (NOTE) Calculated using the CKD-EPI Creatinine Equation (2021)    Anion gap 17 (H) 5 - 15    Comment: Performed at [facility], [place] [address], [place], [place] [id number]  Salicylate level     Status: Abnormal   Collection Time: 07/19/22  2:30 AM  Result Value Ref Range   Salicylate Lvl Q000111Q (L) 7.0 - 30.0 mg/dL    Comment: Performed at [facility] [address], [place], [place] [address]  Acetaminophen level     Status: Abnormal   Collection Time: 07/19/22  2:30 AM  Result Value Ref Range   Acetaminophen (Tylenol), Serum <10 (L) 10 - 30 ug/mL    Comment: (NOTE) Therapeutic concentrations vary significantly. A range of 10-30 ug/mL  may be an effective concentration for many patients. However, some  are best treated at concentrations outside of this range. Acetaminophen concentrations >150 ug/mL at 4 hours after ingestion  and >50 ug/mL at 12 hours after ingestion are often associated with  toxic reactions.  Performed at [facility], [place] [address] [place],  [address]   Ethanol     Status: Abnormal   Collection Time: 07/19/22  2:30 AM  Result Value Ref Range   Alcohol, Ethyl (B) 174 (H) <10 mg/dL    Comment: (NOTE) Lowest detectable limit for serum alcohol is 10 mg/dL.  For medical purposes only. Performed at [facility], [place] [address], [place], [place] [address]   CBC WITH DIFFERENTIAL     Status: Abnormal   Collection Time: 07/19/22  2:30 AM  Result Value Ref Range   WBC 4.7 4.0 - 10.5 K/uL   RBC 4.62 3.87 - 5.11 MIL/uL   Hemoglobin 15.7 (H) 12.0 - 15.0 g/dL   HCT 48.4 (H) 36.0  - 46.0 %   MCV 104.8 (H) 80.0 - 100.0 fL   MCH 34.0 26.0 - 34.0 pg   MCHC 32.4 30.0 - 36.0 g/dL   RDW 15.8 (H) 11.5 - 15.5 %   Platelets 213 150 - 400 K/uL   nRBC 0.0 0.0 - 0.2 %   Neutrophils Relative % 45 %   Neutro Abs 2.1 1.7 - 7.7 K/uL   Lymphocytes Relative 48 %   Lymphs Abs 2.2 0.7 - 4.0 K/uL   Monocytes Relative 7 %   Monocytes Absolute 0.3 0.1 - 1.0 K/uL   Eosinophils Relative 0 %   Eosinophils Absolute 0.0 0.0 - 0.5 K/uL   Basophils Relative 0 %   Basophils Absolute 0.0 0.0 - 0.1 K/uL   Immature Granulocytes 0 %   Abs Immature Granulocytes 0.02 0.00 - 0.07 K/uL    Comment: Performed at [facility], [place] [address], [place],  [place] [address]  I-Stat beta hCG blood, ED     Status: None   Collection Time: 07/19/22  2:49 AM  Result Value Ref Range   I-stat hCG, quantitative <5.0 <5 mIU/mL   Comment 3            Comment:   GEST. AGE      CONC.  (mIU/mL)   <=1 WEEK        5 - 50     2 WEEKS       50 - 500     3 WEEKS       100 - 10,000     4 WEEKS     1,000 - 30,000        FEMALE AND NON-PREGNANT FEMALE:     LESS THAN 5 mIU/mL   Acetaminophen level     Status: Abnormal   Collection Time: 07/19/22  4:31 AM  Result Value Ref Range   Acetaminophen (Tylenol), Serum <10 (L) 10 - 30 ug/mL    Comment: (NOTE) Therapeutic concentrations vary significantly. A range of 10-30 ug/mL  may be an effective concentration for many patients. However, some  are best treated at concentrations outside of this range. Acetaminophen concentrations >150 ug/mL at 4 hours after ingestion  and >50 ug/mL at 12 hours after ingestion are often associated with  toxic reactions.  Performed at [facility], [place] [address], [place], [place] [address]   Comprehensive metabolic panel     Status: Abnormal   Collection Time: 07/19/22  4:31 AM  Result Value Ref Range   Sodium 144 135 - 145 mmol/L   Potassium 2.5 (LL) 3.5 - 5.1 mmol/L    Comment: CRITICAL RESULT CALLED TO, READ BACK BY AND  VERIFIED WITH [facility] RN 07/19/22 0553 Wili[address] Ke   Chloride 104 98 - 111 mmol/L   CO2 25 22 - 32 mmol/L   Glucose, Bld 77 70 - 99 mg/dL    Comment: Glucose reference range applies only to samples taken after fasting for at least 8 hours.   BUN <5 (L) 6 - 20 mg/dL   Creatinine, Ser  0.79 0.44 - 1.00 mg/dL   Calcium 8.1 (L) 8.9 - 10.3 mg/dL   Total Protein 5.7 (L) 6.5 - 8.1 g/dL   Albumin 2.6 (L) 3.5 - 5.0 g/dL   AST 137 (H) 15 - 41 U/L   ALT 122 (H) 0 - 44 U/L   Alkaline Phosphatase 156 (H) 38 - 126 U/L   Total Bilirubin 0.7 0.3 - 1.2 mg/dL   GFR, Estimated >60 >60 mL/min    Comment: (NOTE) Calculated using the CKD-EPI Creatinine Equation (2021)    Anion gap 15 5 - 15    Comment: Performed at [facility], [place] [address] [place], [place] [address]  Magnesium     Status: None   Collection Time: 07/19/22  6:26 AM  Result Value Ref Range   Magnesium 1.8 1.7 - 2.4 mg/dL    Comment: Performed at [facility] [address] [place], [place] [address]  Urine rapid drug screen (hosp performed)     Status: Abnormal   Collection Time: 07/19/22  1:15 PM  Result Value Ref Range   Opiates NONE DETECTED NONE DETECTED   Cocaine NONE DETECTED NONE DETECTED   Benzodiazepines POSITIVE (A) NONE DETECTED   Amphetamines NONE DETECTED NONE DETECTED   Tetrahydrocannabinol POSITIVE (A) NONE DETECTED   Barbiturates NONE DETECTED NONE DETECTED    Comment: (NOTE) DRUG SCREEN FOR MEDICAL PURPOSES ONLY.  IF CONFIRMATION IS NEEDED FOR ANY PURPOSE, NOTIFY LAB WITHIN 5 DAYS.  LOWEST DETECTABLE LIMITS FOR URINE DRUG SCREEN Drug Class                     Cutoff (ng/mL) Amphetamine and metabolites    1000 Barbiturate and metabolites    200 Benzodiazepine                 200 Opiates and metabolites        300 Cocaine and metabolites        300 THC                            50 Performed at [facility], [place] [address] [place] [address], [place], [place] [address]     No current  facility-administered medications for this encounter.   Current Outpatient Medications  Medication Sig Dispense Refill   albuterol (VENTOLIN HFA) 108 (90 Base) MCG/ACT inhaler Inhale 2 puffs into the lungs every 6 (six) hours as needed for wheezing or shortness of breath. 8 g 0   ALPRAZolam (XANAX) 0.25 MG tablet Take 1 tablet (0.25 mg total) by mouth 2 (two) times daily as needed for anxiety. 45 tablet 1   amitriptyline (ELAVIL) 50 MG tablet Take 1 tablet (50 mg total) by mouth at bedtime. 90 tablet 4   carbamazepine (TEGRETOL-XR) 200 MG 12 hr tablet Take 1 tablet (200 mg total) by mouth 2 (two) times daily. 60 tablet 01   cyclobenzaprine (FLEXERIL) 10 MG tablet Take 1 tablet (10 mg total) by mouth 3 (three) times daily as needed for muscle spasms (jaw pain). 30 tablet 1   gabapentin (NEURONTIN) 300 MG capsule Take 2 capsules (600 mg total) by mouth 4 (four) times daily. 270 capsule 11   ibuprofen (ADVIL) 600 MG tablet Take 1 tablet (600 mg total) by mouth every 8 (eight) hours as needed. 30 tablet 0   olmesartan-hydrochlorothiazide (BENICAR HCT) 40-12.5 MG tablet Take 2 tablets by mouth daily. 180 tablet 1   Rimegepant Sulfate (  NURTEC) 75 MG TBDP Take 75 mg by mouth daily as needed (migraine). 15 tablet 3   sertraline (ZOLOFT) 50 MG tablet Take 1/2 tab by mouth at bedtime for 6 days then increase to 1 tab by mouth at bedtime. 30 tablet 3   zolpidem (AMBIEN) 5 MG tablet Take 1 tablet (5 mg total) by mouth at bedtime as needed for sleep. 30 tablet 1    Musculoskeletal: Strength & Muscle Tone: within normal limits Gait & Station: normal Patient leans: N/A   Psychiatric Specialty Exam:  Presentation  General Appearance:  Appropriate for Environment  Eye Contact: Good  Speech: Clear and Coherent  Speech Volume: Normal  Handedness: Right   Mood and Affect  Mood: Dysphoric  Affect: Congruent   Thought Process  Thought Processes: Goal Directed  Descriptions of  Associations:Intact  Orientation:Full (Time, Place and Person)  Thought Content:WDL  History of Schizophrenia/Schizoaffective disorder:No data recorded Duration of Psychotic Symptoms:No data recorded Hallucinations:Hallucinations: None  Ideas of Reference:None  Suicidal Thoughts:Suicidal Thoughts: Yes, Passive SI Passive Intent and/or Plan: Without Intent  Homicidal Thoughts:Homicidal Thoughts: No   Sensorium  Memory: Immediate Good; Remote Good; Recent Good  Judgment: Poor  Insight: Fair   Community education officer  Concentration: Good  Attention Span: Good  Recall: Good  Fund of Knowledge: Good  Language: Good   Psychomotor Activity  Psychomotor Activity: Psychomotor Activity: Normal   Assets  Assets: Communication Skills; Housing; Physical Health   Sleep  Sleep: Sleep: Fair   Physical Exam: Physical Exam Vitals and nursing note reviewed.  Eyes:     General:        Right eye: No discharge.        Left eye: No discharge.  Cardiovascular:     Pulses: Normal pulses.  Pulmonary:     Effort: No respiratory distress.     Breath sounds: No wheezing.  Neurological:     Mental Status: She is alert and oriented to person, place, and time.     Motor: No weakness.  Psychiatric:        Attention and Perception: Attention normal. She does not perceive auditory or visual hallucinations.        Mood and Affect: Mood normal. Mood is not anxious.        Speech: Speech normal.        Behavior: Behavior is cooperative.        Thought Content: Thought content is not paranoid or delusional. Thought content includes suicidal ideation. Thought content does not include homicidal ideation. Thought content includes suicidal plan. Thought content does not include homicidal plan.    Review of Systems  Constitutional:  Negative for diaphoresis, fever and malaise/fatigue.  HENT:  Negative for congestion, ear discharge and hearing loss.   Eyes:  Negative for  discharge and redness.  Respiratory:  Negative for cough, shortness of breath and wheezing.   Cardiovascular:  Negative for chest pain and palpitations.  Gastrointestinal:  Negative for heartburn, nausea and vomiting.  Musculoskeletal:  Negative for back pain and neck pain.  Neurological:  Negative for dizziness, seizures, weakness and headaches.  Psychiatric/Behavioral:  Positive for hallucinations and suicidal ideas. Negative for depression and substance abuse.    Blood pressure (!) 145/101, pulse 83, temperature 98.2 F (36.8 C), temperature source Oral, resp. rate 18, height 6' (1.829 m), weight 90.7 kg, SpO2 98 %. Body mass index is 27.12 kg/m.  Treatment Plan Summary: Plan : Patient will be admitted into psychiatric facility for treatment and stabilization  Disposition: Recommend psychiatric Inpatient admission when medically cleared.  Earney Mallet, NP 07/19/2022 2:34 PM

## 2022-07-19 NOTE — ED Notes (Signed)
Pt changed into  burgandy scrubs

## 2022-07-19 NOTE — ED Notes (Signed)
Pt's dinner arrived, pt woken up from sleep but stated they did not want it for now. Left at bedside table

## 2022-07-19 NOTE — ED Notes (Signed)
Pt's lunch arrived, pt stated she was not hungry. Will leave food at bedside table

## 2022-07-19 NOTE — ED Notes (Signed)
Pt spoke to psychiatry again

## 2022-07-19 NOTE — ED Notes (Signed)
Pt making her phonecall, writer told pt about hospital policies. 2, 5 minute phone calls. Pt compliant with rules.

## 2022-07-19 NOTE — ED Notes (Signed)
Psychiatry talking to pt

## 2022-07-19 NOTE — ED Notes (Signed)
Pt intoxicated and unable to answer some triage questions

## 2022-07-19 NOTE — ED Notes (Signed)
IVC paperwork is complete and in purple zone, expires 07/26/22

## 2022-07-19 NOTE — ED Notes (Signed)
IVC paperwork is complete and in orange zone, expires 07/26/22

## 2022-07-19 NOTE — ED Notes (Signed)
Patient had been asking her sitter about her belongings,(3 cell phones and medications) checked the room, the nurses station, and [facility], was unable to find her belongings, it was charted that these items were here.  Patient advises sitter that the " PA " has her items, spoke to [place], [place] in the physician office and she did have her items with her in there, and advised that they would just stay in there with her. this RN is unaware if medications have been counted, paper work had not been done, patient had not signed the belongings paper work.

## 2022-07-19 NOTE — ED Notes (Signed)
Sitter at patient bedside.
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
## 2022-07-19 NOTE — Progress Notes (Signed)
LCSW Progress Note  [id number]   CHIKAMSO ALMASY  07/19/2022  3:30 PM  Description:   Inpatient Psychiatric Referral  Patient was recommended inpatient per Alver Sorrow, NP. There are no available beds at [facility]. Patient was referred to the following facilities:   Destination  Service Provider Address Phone [facility]  [address] [place] [id number] [phone]  [facility] [place]  [place], [place] [address] [phone] [place]  [address] [place] [address] [phone]  [facility]  [place], [place] [address] [place]  [facility] [place] [address] [facility]  [address], [place] [address] [phone]  [facility]  [place], [place] [address] [phone] [facility]  [id number] [place] [address] [phone] [facility]  [address] [place], [place] [address] [phone] [place] [address] [place] [address] [place]  [facility]  [address] [place] [address] [phone]  [facility]  [address], [place] [address] [phone]  [place] [place] [address] [phone]  [facility]  [place] [address] [phone]  [facility]  [address], [place] [address] [phone] [facility]  [address] [place] [address] [phone] [facility]  [address], [place] [address] [place]  [address] [place] [address] [phone]  [facility]  [address], [place] [address] [id number]  [facility] Office  [address] [place] [address] [phone]  [facility]  [place], [place] [address] [place]  [facility]  [address] [place] [address] [phone] [place]  [address], [place] [address] [phone]  [place]  [address] [place] [address] [phone]  [facility]  [address], [place] [address] [phone] [facility]  [address] [place] [address] [phone]  [facility]  [address] [place] [address] [phone] [facility]  [address] [place] [address] [phone] [facility]  [address], [place] [address] [phone]    Situation ongoing, CSW to continue following and update chart as more information becomes available.      Denna Haggard, [place]  07/19/2022 3:30 PM

## 2022-07-19 NOTE — ED Notes (Signed)
Pt given a drink and a sandwich bag due to not having a diet order this morning. Pt sitting up while eating her sandwich.

## 2022-07-19 NOTE — ED Notes (Signed)
Pt changed into burgundy scrubs and belongings placed into Purple zone locker number 2. Phones and medicine still in MD Pod in orange

## 2022-07-23 ENCOUNTER — Other Ambulatory Visit: Payer: Self-pay

## 2022-07-23 ENCOUNTER — Emergency Department (HOSPITAL_COMMUNITY): Payer: Medicaid Other

## 2022-07-23 ENCOUNTER — Encounter (HOSPITAL_COMMUNITY): Payer: Self-pay | Admitting: Emergency Medicine

## 2022-07-23 ENCOUNTER — Emergency Department (HOSPITAL_COMMUNITY)
Admission: EM | Admit: 2022-07-23 | Discharge: 2022-07-24 | Discharge status: Against Medical Advice | Payer: Medicaid Other | Attending: Student | Admitting: Student

## 2022-07-23 DIAGNOSIS — R258 Other abnormal involuntary movements: Secondary | ICD-10-CM | POA: Diagnosis not present

## 2022-07-23 DIAGNOSIS — R0602 Shortness of breath: Secondary | ICD-10-CM | POA: Diagnosis present

## 2022-07-23 DIAGNOSIS — R4589 Other symptoms and signs involving emotional state: Secondary | ICD-10-CM | POA: Insufficient documentation

## 2022-07-23 DIAGNOSIS — Z5321 Procedure and treatment not carried out due to patient leaving prior to being seen by health care provider: Secondary | ICD-10-CM | POA: Diagnosis not present

## 2022-07-23 DIAGNOSIS — Y900 Blood alcohol level of less than 20 mg/100 ml: Secondary | ICD-10-CM | POA: Diagnosis not present

## 2022-07-23 LAB — COMPREHENSIVE METABOLIC PANEL
ALT: 80 U/L — ABNORMAL HIGH (ref 0–44)
AST: 70 U/L — ABNORMAL HIGH (ref 15–41)
Albumin: 2.8 g/dL — ABNORMAL LOW (ref 3.5–5.0)
Alkaline Phosphatase: 170 U/L — ABNORMAL HIGH (ref 38–126)
Anion gap: 11 (ref 5–15)
BUN: 7 mg/dL (ref 6–20)
CO2: 29 mmol/L (ref 22–32)
Calcium: 8.3 mg/dL — ABNORMAL LOW (ref 8.9–10.3)
Chloride: 97 mmol/L — ABNORMAL LOW (ref 98–111)
Creatinine, Ser: 0.99 mg/dL (ref 0.44–1.00)
GFR, Estimated: >60 mL/min (ref >60)
Glucose, Bld: 114 mg/dL — ABNORMAL HIGH (ref 70–99)
Potassium: 3.4 mmol/L — ABNORMAL LOW (ref 3.5–5.1)
Sodium: 137 mmol/L (ref 135–145)
Total Bilirubin: 0.8 mg/dL (ref 0.3–1.2)
Total Protein: 5.9 g/dL — ABNORMAL LOW (ref 6.5–8.1)

## 2022-07-23 LAB — CBC
HCT: 45.8 % (ref 36.0–46.0)
Hemoglobin: 16.2 g/dL — ABNORMAL HIGH (ref 12.0–15.0)
MCH: 35.8 pg — ABNORMAL HIGH (ref 26.0–34.0)
MCHC: 35.4 g/dL (ref 30.0–36.0)
MCV: 101.3 fL — ABNORMAL HIGH (ref 80.0–100.0)
Platelets: 187 10*3/uL (ref 150–400)
RBC: 4.52 MIL/uL (ref 3.87–5.11)
RDW: 15 % (ref 11.5–15.5)
WBC: 4.3 10*3/uL (ref 4.0–10.5)
nRBC: 0 % (ref 0.0–0.2)

## 2022-07-23 LAB — ETHANOL: Alcohol, Ethyl (B): <10 mg/dL (ref <10)

## 2022-07-23 NOTE — ED Triage Notes (Addendum)
Pt c/o shaking in arms and shoulders for the past 20 minutes. Intermittently complaining of shortness of breath. Unable to obtain accurate triage on patient due to erratic behavior.

## 2022-07-24 NOTE — ED Notes (Signed)
Pt seen leaving the facility.

## 2022-07-24 NOTE — ED Notes (Signed)
Pt leaving hospital. Pt encouraged to stay to see provider and refused.

## 2022-07-29 ENCOUNTER — Other Ambulatory Visit: Payer: Self-pay

## 2022-07-29 ENCOUNTER — Ambulatory Visit (INDEPENDENT_AMBULATORY_CARE_PROVIDER_SITE_OTHER): Payer: Medicaid Other | Admitting: Psychiatry

## 2022-07-29 ENCOUNTER — Encounter (HOSPITAL_COMMUNITY): Payer: Self-pay | Admitting: Psychiatry

## 2022-07-29 DIAGNOSIS — F332 Major depressive disorder, recurrent severe without psychotic features: Secondary | ICD-10-CM

## 2022-07-29 DIAGNOSIS — F411 Generalized anxiety disorder: Secondary | ICD-10-CM | POA: Diagnosis not present

## 2022-07-29 MED ORDER — HYDROXYZINE HCL 25 MG PO TABS
25.0000 mg | ORAL_TABLET | Freq: Three times a day (TID) | ORAL | 3 refills | Status: DC | PRN
  Filled 2022-07-29: qty 90, 30d supply, fill #0

## 2022-07-29 MED ORDER — TRAZODONE HCL 50 MG PO TABS
50.0000 mg | ORAL_TABLET | Freq: Every day | ORAL | 3 refills | Status: DC
  Filled 2022-07-29: qty 30, 30d supply, fill #0

## 2022-07-29 MED ORDER — SERTRALINE HCL 50 MG PO TABS
ORAL_TABLET | ORAL | 3 refills | Status: DC
  Filled 2022-07-29: qty 30, 33d supply, fill #0

## 2022-07-29 NOTE — Progress Notes (Signed)
Psychiatric Initial Adult Assessment  Virtual Visit via Video Note  I connected with [name] on 07/29/22 at  2:00 PM EDT by a video enabled telemedicine application and verified that I am speaking with the correct person using two identifiers.  Location: Patient: Home Provider: Clinic   I discussed the limitations of evaluation and management by telemedicine and the availability of in person appointments. The patient expressed understanding and agreed to proceed.  I provided 45 minutes of non-face-to-face time during this encounter.   Patient Identification: [name] MRN:  [id number] Date of Evaluation:  07/29/2022 Referral Source: [facility] Chief Complaint:  "I am so stressed" Visit Diagnosis:    ICD-10-CM   1. Severe episode of recurrent major depressive disorder, without psychotic features ([place])  F33.2 traZODone (DESYREL) 50 MG tablet    Ambulatory referral to Social Work    2. Generalized anxiety disorder  F41.1 sertraline (ZOLOFT) 50 MG tablet    hydrOXYzine (ATARAX) 25 MG tablet    Ambulatory referral to Social Work      History of Present Illness: 47 year old female seen today for initial psychiatric evaluation.  She was referred to outpatient psychiatry by [facility] ED where she was seen on 07/19/2022 and then again on 08-16-22 for SA via over doses and depression.  Patient notes that she transferred to the [facility].  Provider saw that patient went to [facility] however discharge medications were not listed.  Per patient she believed she was restarted on old prescriptions of Xanax, Ambien, Zoloft, trazodone, and hydroxyzine.  Today she is well-groomed, tearful, engaged in conversation, and maintained eye contact.  Patient informed Probation officer that she is going through a lot of stress.  She notes that she and her husband are separated and currently she is unemployed.  Patient notes that she feels betrayed by her husband as he cheated on her and brought his  girlfriend and her child into their home.  Patient notes that her husband is not supportive.  She notes that when she was hospitalized he did not come and see her nor did he call her.  Currently she reports that she is living with him but finds it difficult.  She informed Probation officer that she is having issues sleeping.  She notes that she only sleeps 1 to 2 hours nightly.  Patient notes that financially she is stressed that she does not have a job and continues to depend on him for transportation.  Today provider conducted a GAD-7 and patient scored a 21.  Provider also conducted PHQ-9 and patient scored a 24.  Today she denies SI/HI/AVH or paranoia.  Patient informed writer that the death of her mother in 08/16/2019 was traumatic.  She notes that since her mothers death she does not have any supports.  She does have 4 children but notes that she cannot rely on them.  Patient informed Probation officer that she has chronic pain.  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  She reports that she needs surgery but does not wish to do it as will cause her to go deaf.  She also notes that she has stomach pains due to irritable bowel syndrome.  Patient also reports that she has neuropathy in her legs which makes it difficult for her to walk.  She informed Probation officer that she had a PCP while employed at [employer] but notes that she no longer has one because she lost insurance.  She informed Probation officer that  she was taking gabapentin to help manage her pain however when she attempted to restart on 07/23/2022 it caused her to shake excessively.  She informed Probation officer that she discontinued it and has a follow-up with a primary care doctor.  Patient informed Probation officer that she felt her best when she was in therapy.  She informed Probation officer that she disliked medications but will try them to help manage her mental health conditions.  Patient referred to outpatient counseling for therapy.  Patient agreeable to starting Zoloft 50 mg daily  to help manage anxiety and depression. She is also agreeable to starting trazodone 50 mg nightly as needed and hydroxyzine 25 mg 3 times daily as needed to help manage anxiety and sleep.Potential side effects of medication and risks vs benefits of treatment vs non-treatment were explained and discussed. All questions were answered. No other concerns noted at this time.    Associated Signs/Symptoms: Depression Symptoms:  depressed mood, anhedonia, insomnia, psychomotor agitation, psychomotor retardation, feelings of worthlessness/guilt, difficulty concentrating, hopelessness, recurrent thoughts of death, anxiety, panic attacks, loss of energy/fatigue, weight loss, decreased appetite, (Hypo) Manic Symptoms:  Distractibility, Flight of Ideas, Irritable Mood, Anxiety Symptoms:  Excessive Worry, Psychotic Symptoms:   Denies PTSD Symptoms: Had a traumatic exposure:  Husband cheated on her  Past Psychiatric History: Anxiety, depression, adjustment disorder, SI/SA, PTSD  Previous Psychotropic Medications:  Xanax, Ambien, Zoloft, gabapentin, tegretol, trazodone, elavil, lexapro, Seroquel, topamax  Substance Abuse History in the last 12 months:  Yes.    Consequences of Substance Abuse: Reports she does not use marijuana but was found in her system at last hospitalizations. Patient reports using   Past Medical History:  Past Medical History:  Diagnosis Date   Adnexal cyst 2013   right   Anemia 2015   Pernicious and iron deficiency. As of 2015/2016 no longer requiring po iron or B12 shots.    Anxiety 2020   Anxiety and ptsd   Chondromalacia 2005   left knee. arthroscopy x 2.    Colitis    COPD (chronic obstructive pulmonary disease) ([place]) 2018   Headache(784.0)    migraines   Hydradenitis 2001   axillary, multiple surgical I & Ds/excisions.   Hypertension    Nausea & vomiting 11/28/2014   Neuromuscular disorder ([place]) 2020   Peripheral neuropathy   Obesity (BMI 30.0-34.9)     Pilonidal cyst    Pityriasis rosea    pt also gives hx of seborrheic dermatitis.     Past Surgical History:  Procedure Laterality Date   ABDOMINAL HYSTERECTOMY     CYSTECTOMY     2004 axilla bil   ESOPHAGOGASTRODUODENOSCOPY N/A 11/30/2014   Procedure: ESOPHAGOGASTRODUODENOSCOPY (EGD);  Surgeon: Jerene Bears, MD;  Location: [facility] ENDOSCOPY;  Service: Endoscopy;  Laterality: N/A;   FRACTURE SURGERY     rt arm   GASTRIC BYPASS  05/20/2009   gastric sleeve   KNEE SURGERY  05/21/2003   left   LAPAROSCOPIC CHOLECYSTECTOMY  05/20/2000   MEDIAL PATELLOFEMORAL LIGAMENT REPAIR Left 06/08/2013   Procedure: DIAGNOSTIC OPERATIVE ARTHROSCOPY, OPEN MEDIAL PATELLA FEMORAL LIGAMENT RECONSTRUCTION;  Surgeon: Meredith Pel, MD;  Location: [place];  Service: Orthopedics;  Laterality: Left;  with Hamstring autograft   OVARIAN CYST REMOVAL     PARTIAL HYSTERECTOMY     R arm surgery      Family Psychiatric History: Unknown  Family History:  Family History  Problem Relation Age of Onset   Lupus Mother    Stroke Mother    Hypertension  Mother    Arthritis Mother    Crohn's disease Father    Hypertension Father    Gallbladder disease Father    Cancer Maternal Grandfather        lung   COPD Maternal Grandfather    Emphysema Maternal Grandfather    Lung cancer Maternal Grandmother    Stroke Maternal Grandmother    Cancer Maternal Grandmother    Coronary artery disease Paternal Grandfather    Heart disease Paternal Grandfather    Stroke Paternal Grandmother     Social History:   Social History   Socioeconomic History   Marital status: Single    Spouse name: Not on file   Number of children: Not on file   Years of education: Not on file   Highest education level: Not on file  Occupational History   Occupation: Insurance account manager: [employer]    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   Tobacco Use   Smoking status: Every Day    Packs/day: 0.50    Years:  2.00    Total pack years: 1.00    Types: Cigarettes    Start date: 05/20/2013   Smokeless tobacco: Never  Vaping Use   Vaping Use: Some days   Substances: Nicotine, Flavoring  Substance and Sexual Activity   Alcohol use: Yes    Alcohol/week: 1.0 - 2.0 standard drink of alcohol    Types: 1 - 2 Standard drinks or equivalent per week    Comment: occ   Drug use: Not Currently    Types: Benzodiazepines, Other-see comments    Comment: Cbd and anxiety meds   Sexual activity: Yes    Partners: Male    Birth control/protection: Surgical  Other Topics Concern   Not on file  Social History Narrative   Not on file   Social Determinants of Health   Financial Resource Strain: Not on file  Food Insecurity: Not on file  Transportation Needs: Not on file  Physical Activity: Not on file  Stress: Not on file  Social Connections: Not on file    Additional Social History: Patient resides in [place]. She is married but separated. She currently lives with her husband. She has four children (two boys and two girls).   Allergies:   Allergies  Allergen Reactions   Penicillins Anaphylaxis, Swelling and Rash    Face and throat swell    Metabolic Disorder Labs: Lab Results  Component Value Date   HGBA1C 5.5 08/16/2021   MPG 82 10/05/2020   No results found for: "PROLACTIN" Lab Results  Component Value Date   CHOL 91 11/29/2014   TRIG 48 11/29/2014   HDL 33 (L) 11/29/2014   CHOLHDL 2.8 11/29/2014   VLDL 10 11/29/2014   LDLCALC 48 11/29/2014   Lab Results  Component Value Date   TSH 2.040 08/16/2021    Therapeutic Level Labs: No results found for: "LITHIUM" No results found for: "CBMZ" No results found for: "VALPROATE"  Current Medications: Current Outpatient Medications  Medication Sig Dispense Refill   hydrOXYzine (ATARAX) 25 MG tablet Take 1 tablet (25 mg total) by mouth 3 (three) times daily as needed. 90 tablet 3   traZODone (DESYREL) 50 MG tablet Take 1 tablet (50 mg  total) by mouth at bedtime. 30 tablet 3   albuterol (VENTOLIN HFA) 108 (90 Base) MCG/ACT inhaler Inhale 2 puffs into the lungs every 6 (six) hours as needed for wheezing or shortness of breath. 8 g 0  carbamazepine (TEGRETOL-XR) 200 MG 12 hr tablet Take 1 tablet (200 mg total) by mouth 2 (two) times daily. 60 tablet 01   cyclobenzaprine (FLEXERIL) 10 MG tablet Take 1 tablet (10 mg total) by mouth 3 (three) times daily as needed for muscle spasms (jaw pain). 30 tablet 1   gabapentin (NEURONTIN) 300 MG capsule Take 2 capsules (600 mg total) by mouth 4 (four) times daily. 270 capsule 11   ibuprofen (ADVIL) 600 MG tablet Take 1 tablet (600 mg total) by mouth every 8 (eight) hours as needed. 30 tablet 0   olmesartan-hydrochlorothiazide (BENICAR HCT) 40-12.5 MG tablet Take 2 tablets by mouth daily. 180 tablet 1   Rimegepant Sulfate (NURTEC) 75 MG TBDP Take 75 mg by mouth daily as needed (migraine). 15 tablet 3   sertraline (ZOLOFT) 50 MG tablet Take 0.5 tablets (25 mg total) by mouth at bedtime for 6 days, THEN 1 tablet (50 mg total) at bedtime thereafter. 30 tablet 3   No current facility-administered medications for this visit.    Musculoskeletal: Strength & Muscle Tone: within normal limits and Telehealth visit Gait & Station: normal, Telehealth visit Patient leans: N/A  Psychiatric Specialty Exam: Review of Systems  There were no vitals taken for this visit.There is no height or weight on file to calculate BMI.  General Appearance: Well Groomed  Eye Contact:  Good  Speech:  Clear and Coherent and Normal Rate  Volume:  Normal  Mood:  Anxious and Depressed  Affect:  Appropriate and Congruent  Thought Process:  Coherent, Goal Directed, and Linear  Orientation:  Full (Time, Place, and Person)  Thought Content:  WDL and Logical  Suicidal Thoughts:  No  Homicidal Thoughts:  No  Memory:  Immediate;   Good Recent;   Good Remote;   Good  Judgement:  Concentration: Good and Attention Span:  Good  Insight:  Good  Psychomotor Activity:  Normal  Concentration:  Concentration: Good and Attention Span: Good  Recall:  Good  Fund of Knowledge:Good  Language: Good  Akathisia:  No  Handed:  Left  AIMS (if indicated):  not done  Assets:  Communication Skills Desire for Improvement Financial Resources/Insurance Housing Intimacy Leisure Time Physical Health  ADL's:  Intact  Cognition: WNL  Sleep:  Good   Screenings: GAD-7    Flowsheet Row Office Visit from 07/29/2022 in [facility] Office Visit from 10/05/2020 in [place] at [facility]  Total GAD-7 Score 21 14      PHQ2-9    [place] Office Visit from 07/29/2022 in [facility] Video Visit from 10/29/2021 in [place] at [place] Visit from 08/16/2021 in [place] at [place] Visit from 06/18/2021 in [place] at [place] Visit from 10/05/2020 in [place] at [facility]  PHQ-2 Total Score 6 0 1 0 0  PHQ-9 Total Score 24 -- -- -- 9      Flowsheet Row ED from 07/23/2022 in [facility] Emergency Department at [facility] ED from 07/19/2022 in [facility] Emergency Department at [facility] ED from 04/05/2022 in [facility] Emergency Department at [place] No Risk No Risk No Risk       Assessment and Plan: Patient endorses increased anxiety, depression, and insomnia due to life  stressors. Patient notes that she went to [place] center and was prescribed medication (Xanax, Ambien, zoloft, hydroxyzine, tegretol).  Per medical record the patient was admitted however list discharge medications were not documented.  Today patient agreeable to continuing Zoloft 50 mg, trazodone 50 mg  nightly as needed, hydroxyzine 25 mg 3 times daily.  She still have some gabapentin prescribed from her PCP.  1. Generalized anxiety disorder  Continue- sertraline (ZOLOFT) 50 MG tablet; Take 0.5 tablets (25 mg total) by mouth at bedtime for 6 days, THEN 1 tablet (50 mg total) at bedtime thereafter.  Dispense: 30 tablet; Refill: 3 Continue- hydrOXYzine (ATARAX) 25 MG tablet; Take 1 tablet (25 mg total) by mouth 3 (three) times daily as needed.  Dispense: 90 tablet; Refill: 3 - Ambulatory referral to Social Work  2. Severe episode of recurrent major depressive disorder, without psychotic features ([place])  Continue- traZODone (DESYREL) 50 MG tablet; Take 1 tablet (50 mg total) by mouth at bedtime.  Dispense: 30 tablet; Refill: 3 - Ambulatory referral to Social Work   Collaboration of Care: Other provider involved in patient's care AEB PCP and counselor  Patient/Guardian was advised Release of Information must be obtained prior to any record release in order to collaborate their care with an outside provider. Patient/Guardian was advised if they have not already done so to contact the registration department to sign all necessary forms in order for [place] to release information regarding their care.   Consent: Patient/Guardian gives verbal consent for treatment and assignment of benefits for services provided during this visit. Patient/Guardian expressed understanding and agreed to proceed.   Follow up in 3 months Follow up with therapy  Salley Slaughter, NP 3/11/20243:07 PM

## 2022-08-14 ENCOUNTER — Other Ambulatory Visit: Payer: Self-pay

## 2022-08-22 ENCOUNTER — Encounter (HOSPITAL_COMMUNITY): Payer: Self-pay

## 2022-08-22 ENCOUNTER — Ambulatory Visit (INDEPENDENT_AMBULATORY_CARE_PROVIDER_SITE_OTHER): Payer: Medicaid Other | Admitting: Clinical

## 2022-08-22 ENCOUNTER — Telehealth (HOSPITAL_COMMUNITY): Payer: Self-pay | Admitting: Psychiatry

## 2022-08-22 DIAGNOSIS — F332 Major depressive disorder, recurrent severe without psychotic features: Secondary | ICD-10-CM

## 2022-08-22 DIAGNOSIS — F411 Generalized anxiety disorder: Secondary | ICD-10-CM

## 2022-08-22 NOTE — Telephone Encounter (Signed)
Patient notes that she is going though a crisis. She notes that she and her husband are separated due to infidelity. Patient notes that she left her husband and now living with her stepfather. She reports that she was not taking her medication consistently but now feels that she needs it due to her increasing anxiety and depression. Patient asked Probation officer when she should take medications. Provider instructed patient to take Zoloft in the morning, hydroxyzine three times daily as needed, and trazodone nightly as needed. She endorsed understanding and agreed. No other concerns noted at this time.
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
## 2022-09-20 ENCOUNTER — Encounter (HOSPITAL_BASED_OUTPATIENT_CLINIC_OR_DEPARTMENT_OTHER): Payer: Self-pay | Admitting: Urology

## 2022-09-20 ENCOUNTER — Emergency Department (HOSPITAL_BASED_OUTPATIENT_CLINIC_OR_DEPARTMENT_OTHER): Payer: No Typology Code available for payment source

## 2022-09-20 ENCOUNTER — Other Ambulatory Visit: Payer: Self-pay

## 2022-09-20 ENCOUNTER — Emergency Department (HOSPITAL_BASED_OUTPATIENT_CLINIC_OR_DEPARTMENT_OTHER)
Admission: EM | Admit: 2022-09-20 | Discharge: 2022-09-20 | Disposition: A | Discharge status: Home / Self Care | Payer: No Typology Code available for payment source | Attending: Emergency Medicine | Admitting: Emergency Medicine

## 2022-09-20 DIAGNOSIS — F17210 Nicotine dependence, cigarettes, uncomplicated: Secondary | ICD-10-CM | POA: Insufficient documentation

## 2022-09-20 DIAGNOSIS — R0789 Other chest pain: Secondary | ICD-10-CM | POA: Diagnosis not present

## 2022-09-20 DIAGNOSIS — R000 Tachycardia, unspecified: Secondary | ICD-10-CM | POA: Diagnosis not present

## 2022-09-20 DIAGNOSIS — I10 Essential (primary) hypertension: Secondary | ICD-10-CM | POA: Diagnosis not present

## 2022-09-20 DIAGNOSIS — R079 Chest pain, unspecified: Secondary | ICD-10-CM | POA: Diagnosis present

## 2022-09-20 DIAGNOSIS — J449 Chronic obstructive pulmonary disease, unspecified: Secondary | ICD-10-CM | POA: Insufficient documentation

## 2022-09-20 LAB — COMPREHENSIVE METABOLIC PANEL
ALT: 27 U/L (ref 0–44)
AST: 50 U/L — ABNORMAL HIGH (ref 15–41)
Albumin: 3 g/dL — ABNORMAL LOW (ref 3.5–5.0)
Alkaline Phosphatase: 149 U/L — ABNORMAL HIGH (ref 38–126)
Anion gap: 10 (ref 5–15)
BUN: 7 mg/dL (ref 6–20)
CO2: 29 mmol/L (ref 22–32)
Calcium: 8.2 mg/dL — ABNORMAL LOW (ref 8.9–10.3)
Chloride: 101 mmol/L (ref 98–111)
Creatinine, Ser: 0.84 mg/dL (ref 0.44–1.00)
GFR, Estimated: >60 mL/min (ref >60)
Glucose, Bld: 125 mg/dL — ABNORMAL HIGH (ref 70–99)
Potassium: 2.4 mmol/L — CL (ref 3.5–5.1)
Sodium: 140 mmol/L (ref 135–145)
Total Bilirubin: 0.9 mg/dL (ref 0.3–1.2)
Total Protein: 6.3 g/dL — ABNORMAL LOW (ref 6.5–8.1)

## 2022-09-20 LAB — BASIC METABOLIC PANEL
Anion gap: 7 (ref 5–15)
BUN: 8 mg/dL (ref 6–20)
CO2: 28 mmol/L (ref 22–32)
Calcium: 8.1 mg/dL — ABNORMAL LOW (ref 8.9–10.3)
Chloride: 106 mmol/L (ref 98–111)
Creatinine, Ser: 0.79 mg/dL (ref 0.44–1.00)
GFR, Estimated: >60 mL/min (ref >60)
Glucose, Bld: 106 mg/dL — ABNORMAL HIGH (ref 70–99)
Potassium: 4.7 mmol/L (ref 3.5–5.1)
Sodium: 141 mmol/L (ref 135–145)

## 2022-09-20 LAB — CBC WITH DIFFERENTIAL/PLATELET
Abs Immature Granulocytes: 0.01 10*3/uL (ref 0.00–0.07)
Basophils Absolute: 0 10*3/uL (ref 0.0–0.1)
Basophils Relative: 0 %
Eosinophils Absolute: 0.1 10*3/uL (ref 0.0–0.5)
Eosinophils Relative: 1 %
HCT: 38.3 % (ref 36.0–46.0)
Hemoglobin: 13 g/dL (ref 12.0–15.0)
Immature Granulocytes: 0 %
Lymphocytes Relative: 31 %
Lymphs Abs: 1.6 10*3/uL (ref 0.7–4.0)
MCH: 36.1 pg — ABNORMAL HIGH (ref 26.0–34.0)
MCHC: 33.9 g/dL (ref 30.0–36.0)
MCV: 106.4 fL — ABNORMAL HIGH (ref 80.0–100.0)
Monocytes Absolute: 0.5 10*3/uL (ref 0.1–1.0)
Monocytes Relative: 8 %
Neutro Abs: 3.2 10*3/uL (ref 1.7–7.7)
Neutrophils Relative %: 60 %
Platelets: 221 10*3/uL (ref 150–400)
RBC: 3.6 MIL/uL — ABNORMAL LOW (ref 3.87–5.11)
RDW: 16.5 % — ABNORMAL HIGH (ref 11.5–15.5)
WBC: 5.3 10*3/uL (ref 4.0–10.5)
nRBC: 0 % (ref 0.0–0.2)

## 2022-09-20 LAB — PROTIME-INR
INR: 0.9 (ref 0.8–1.2)
Prothrombin Time: 12 seconds (ref 11.4–15.2)

## 2022-09-20 LAB — TROPONIN I (HIGH SENSITIVITY)
Troponin I (High Sensitivity): 8 ng/L (ref <18)
Troponin I (High Sensitivity): 8 ng/L (ref <18)

## 2022-09-20 LAB — D-DIMER, QUANTITATIVE: D-Dimer, Quant: 0.3 ug/mL-FEU (ref 0.00–0.50)

## 2022-09-20 LAB — MAGNESIUM: Magnesium: 1.7 mg/dL (ref 1.7–2.4)

## 2022-09-20 LAB — BRAIN NATRIURETIC PEPTIDE: B Natriuretic Peptide: 41.6 pg/mL (ref 0.0–100.0)

## 2022-09-20 MED ORDER — MAGNESIUM SULFATE 2 GM/50ML IV SOLN
2.0000 g | Freq: Once | INTRAVENOUS | Status: AC
  Administered 2022-09-20: 2 g via INTRAVENOUS
  Filled 2022-09-20: qty 50

## 2022-09-20 MED ORDER — POTASSIUM CHLORIDE 20 MEQ PO PACK
60.0000 meq | PACK | Freq: Once | ORAL | Status: AC
  Administered 2022-09-20: 60 meq via ORAL
  Filled 2022-09-20: qty 3

## 2022-09-20 MED ORDER — ACETAMINOPHEN 500 MG PO TABS
1000.0000 mg | ORAL_TABLET | Freq: Once | ORAL | Status: AC
  Administered 2022-09-20: 1000 mg via ORAL
  Filled 2022-09-20: qty 2

## 2022-09-20 NOTE — ED Triage Notes (Signed)
Pt states central chest pain that radiates to her left arm and left sided jaw  pain that started 2 days ago  Been waking up in cold sweats  Reports nausea Denies any SOB

## 2022-09-20 NOTE — ED Provider Notes (Signed)
[facility] Provider Note  CSN: [id number] Arrival date & time: 09/20/22 1510  Chief Complaint(s) Chest Pain  HPI [name] is a 47 y.o. female with history of COPD, hypertension presenting to the emergency department with chest pain.  The patient reports chest pain in the center of her chest for the past 2 days.  Reports that it radiates to her left arm and left jaw.  She reports occasional night sweats but not clearly associated with the chest pain.  Has not noticed the pain worse with exertion and denies any pleuritic pain.  Reports occasional nausea without vomiting.  Reports very mild associated shortness of breath..  No lightheadedness or dizziness.  No fainting.  No cough, fevers or chills.  Patient still smokes.  She does report family history of coronary artery disease.   Past Medical History Past Medical History:  Diagnosis Date   Adnexal cyst 2013   right   Anemia 2015   Pernicious and iron deficiency. As of 2015/2016 no longer requiring po iron or B12 shots.    Anxiety 2020   Anxiety and ptsd   Chondromalacia 2005   left knee. arthroscopy x 2.    Colitis    COPD (chronic obstructive pulmonary disease) (HCC) 2018   Headache(784.0)    migraines   Hydradenitis 2001   axillary, multiple surgical I & Ds/excisions.   Hypertension    Nausea & vomiting 11/28/2014   Neuromuscular disorder (HCC) 2020   Peripheral neuropathy   Obesity (BMI 30.0-34.9)    Pilonidal cyst    Pityriasis rosea    pt also gives hx of seborrheic dermatitis.    Patient Active Problem List   Diagnosis Date Noted   Suicidal ideation 07/19/2022   Intentional overdose (HCC) 07/19/2022   Paresthesia of bilateral legs 11/05/2021   Panic disorder 10/29/2021   Vestibular schwannoma (HCC) 08/16/2021   Insomnia 07/31/2021   Intractable headache 07/03/2021   TMJ syndrome 06/18/2021   B12 deficiency 01/24/2021   Corneal abrasion 11/25/2020   Elevated  MCV 11/25/2020   Visual changes 11/25/2020   Migraine 11/15/2020   Elevated liver enzymes 11/02/2020   Peripheral neuropathy 10/11/2020   Irritable bowel syndrome with diarrhea 02/02/2018   Generalized anxiety disorder 02/02/2018   Perirectal abscess 10/28/2016   Chronic fatigue 01/09/2016   Adrenal nodule (HCC) 01/09/2016   Chronic low back pain 11/21/2015   Elevated lipase 12/14/2014   AP (abdominal pain) 12/14/2014   Nausea and vomiting 11/29/2014   Intractable abdominal pain 11/28/2014   Hypokalemia 11/28/2014   HTN (hypertension) 11/28/2014   Pilonidal abscess 12/03/2013   Patellar instability of left knee 06/08/2013   Home Medication(s) Prior to Admission medications   Medication Sig Start Date End Date Taking? Authorizing Provider  albuterol (VENTOLIN HFA) 108 (90 Base) MCG/ACT inhaler Inhale 2 puffs into the lungs every 6 (six) hours as needed for wheezing or shortness of breath. 11/07/21   Viviano Simas, FNP  carbamazepine (TEGRETOL-XR) 200 MG 12 hr tablet Take 1 tablet (200 mg total) by mouth 2 (two) times daily. 07/03/21   Everrett Coombe, DO  cyclobenzaprine (FLEXERIL) 10 MG tablet Take 1 tablet (10 mg total) by mouth 3 (three) times daily as needed for muscle spasms (jaw pain). 06/18/21   Everrett Coombe, DO  gabapentin (NEURONTIN) 300 MG capsule Take 2 capsules (600 mg total) by mouth 4 (four) times daily. 09/07/21   Tollie Eth, NP  hydrOXYzine (ATARAX) 25 MG tablet Take 1  tablet (25 mg total) by mouth 3 (three) times daily as needed. 07/29/22   Shanna Cisco, NP  ibuprofen (ADVIL) 600 MG tablet Take 1 tablet (600 mg total) by mouth every 8 (eight) hours as needed. 06/02/21   Claiborne Rigg, NP  olmesartan-hydrochlorothiazide (BENICAR HCT) 40-12.5 MG tablet Take 2 tablets by mouth daily. 07/31/21   Everrett Coombe, DO  Rimegepant Sulfate (NURTEC) 75 MG TBDP Take 75 mg by mouth daily as needed (migraine). 12/27/20   Everrett Coombe, DO  sertraline (ZOLOFT) 50 MG tablet Take  0.5 tablets (25 mg total) by mouth at bedtime for 6 days, THEN 1 tablet (50 mg total) at bedtime thereafter. 07/29/22 08/31/22  Shanna Cisco, NP  traZODone (DESYREL) 50 MG tablet Take 1 tablet (50 mg total) by mouth at bedtime. 07/29/22   Shanna Cisco, NP                                                                                                                                    Past Surgical History Past Surgical History:  Procedure Laterality Date   ABDOMINAL HYSTERECTOMY     CYSTECTOMY     2004 axilla bil   ESOPHAGOGASTRODUODENOSCOPY N/A 11/30/2014   Procedure: ESOPHAGOGASTRODUODENOSCOPY (EGD);  Surgeon: Beverley Fiedler, MD;  Location: [facility] ENDOSCOPY;  Service: Endoscopy;  Laterality: N/A;   FRACTURE SURGERY     rt arm   GASTRIC BYPASS  05/20/2009   gastric sleeve   KNEE SURGERY  05/21/2003   left   LAPAROSCOPIC CHOLECYSTECTOMY  05/20/2000   MEDIAL PATELLOFEMORAL LIGAMENT REPAIR Left 06/08/2013   Procedure: DIAGNOSTIC OPERATIVE ARTHROSCOPY, OPEN MEDIAL PATELLA FEMORAL LIGAMENT RECONSTRUCTION;  Surgeon: Cammy Copa, MD;  Location[facility] OR;  Service: Orthopedics;  Laterality: Left;  with Hamstring autograft   OVARIAN CYST REMOVAL     PARTIAL HYSTERECTOMY     R arm surgery     Family History Family History  Problem Relation Age of Onset   Lupus Mother    Stroke Mother    Hypertension Mother    Arthritis Mother    Crohn's disease Father    Hypertension Father    Gallbladder disease Father    Cancer Maternal Grandfather        lung   COPD Maternal Grandfather    Emphysema Maternal Grandfather    Lung cancer Maternal Grandmother    Stroke Maternal Grandmother    Cancer Maternal Grandmother    Coronary artery disease Paternal Grandfather    Heart disease Paternal Grandfather    Stroke Paternal Grandmother     Social History Social History   Tobacco Use   Smoking status: Every Day    Packs/day: 0.50    Years: 2.00    Additional pack years: 0.00     Total pack years: 1.00    Types: Cigarettes    Start date: 05/20/2013   Smokeless tobacco: Never  Vaping Use  Vaping Use: Some days   Substances: Nicotine, Flavoring  Substance Use Topics   Alcohol use: Yes    Alcohol/week: 1.0 - 2.0 standard drink of alcohol    Types: 1 - 2 Standard drinks or equivalent per week    Comment: occ   Drug use: Not Currently    Types: Benzodiazepines, Other-see comments    Comment: Cbd and anxiety meds   Allergies Penicillins  Review of Systems Review of Systems  All other systems reviewed and are negative.   Physical Exam Vital Signs  I have reviewed the triage vital signs BP (!) 124/90   Pulse 75   Temp 99 F (37.2 C) (Oral)   Resp (!) 21   Ht 6' (1.829 m)   Wt 88.5 kg   SpO2 100%   BMI 26.46 kg/m  Physical Exam Vitals and nursing note reviewed.  Constitutional:      General: She is not in acute distress.    Appearance: She is well-developed.  HENT:     Head: Normocephalic and atraumatic.     Mouth/Throat:     Mouth: Mucous membranes are moist.  Eyes:     Pupils: Pupils are equal, round, and reactive to light.  Cardiovascular:     Rate and Rhythm: Regular rhythm. Tachycardia present.     Heart sounds: No murmur heard. Pulmonary:     Effort: Pulmonary effort is normal. No respiratory distress.     Breath sounds: Normal breath sounds.  Abdominal:     General: Abdomen is flat.     Palpations: Abdomen is soft.     Tenderness: There is no abdominal tenderness.  Musculoskeletal:        General: No tenderness.     Right lower leg: No edema.     Left lower leg: No edema.  Skin:    General: Skin is warm and dry.  Neurological:     General: No focal deficit present.     Mental Status: She is alert. Mental status is at baseline.  Psychiatric:        Mood and Affect: Mood normal.        Behavior: Behavior normal.     ED Results and Treatments Labs (all labs ordered are listed, but only abnormal results are displayed) Labs  Reviewed  COMPREHENSIVE METABOLIC PANEL - Abnormal; Notable for the following components:      Result Value   Potassium 2.4 (*)    Glucose, Bld 125 (*)    Calcium 8.2 (*)    Total Protein 6.3 (*)    Albumin 3.0 (*)    AST 50 (*)    Alkaline Phosphatase 149 (*)    All other components within normal limits  CBC WITH DIFFERENTIAL/PLATELET - Abnormal; Notable for the following components:   RBC 3.60 (*)    MCV 106.4 (*)    MCH 36.1 (*)    RDW 16.5 (*)    All other components within normal limits  BASIC METABOLIC PANEL - Abnormal; Notable for the following components:   Glucose, Bld 106 (*)    Calcium 8.1 (*)    All other components within normal limits  PROTIME-INR  BRAIN NATRIURETIC PEPTIDE  D-DIMER, QUANTITATIVE  MAGNESIUM  TROPONIN I (HIGH SENSITIVITY)  TROPONIN I (HIGH SENSITIVITY)  Radiology DG Chest Port 1 View  Result Date: 09/20/2022 CLINICAL DATA:  Cough, chest pain EXAM: PORTABLE CHEST 1 VIEW COMPARISON:  07/23/2022 FINDINGS: The heart size and mediastinal contours are within normal limits. Both lungs are clear. The visualized skeletal structures are unremarkable. IMPRESSION: No active cardiopulmonary disease. Electronically Signed   By: Ernie Avena M.D.   On: 09/20/2022 15:45    Pertinent labs & imaging results that were available during my care of the patient were reviewed by me and considered in my medical decision making (see MDM for details).  Medications Ordered in ED Medications  acetaminophen (TYLENOL) tablet 1,000 mg (1,000 mg Oral Given 09/20/22 1550)  potassium chloride (KLOR-CON) packet 60 mEq (60 mEq Oral Given 09/20/22 1642)  magnesium sulfate IVPB 2 g 50 mL (0 g Intravenous Stopped 09/20/22 1816)  potassium chloride (KLOR-CON) packet 60 mEq (60 mEq Oral Given 09/20/22 1755)                                                                                                                                      Procedures Procedures  (including critical care time)  Medical Decision Making / ED Course   MDM:  47 year old female presenting to the emergency department with chest pain.  Differential includes ACS, pulmonary embolism, musculoskeletal pain, pneumonia, pneumothorax.  Will check labs including troponin, D-dimer.  Doubt esophageal pathology without vomiting.  Doubt dissection given 2 days of symptoms, mild symptoms.  EKG without STEMI, has some nonspecific ST abnormalities.  Will reassess. If workup is reassuring can likely follow up with cardiology as outpatient.   Clinical Course as of 09/20/22 2004  Fri Sep 20, 2022  1958 Labs overall reassuring with negative troponin x 2.  D-dimer also negative.  BNP negative.  Labs initially notable for significant hypokalemia.  Patient received repletion as well as magnesium and on recheck has improved.  Given reassuring troponin, lower concern for ACS but given risk factors will refer to cardiology for expedited follow-up and consideration of stress test. Will discharge patient to home. All questions answered. Patient comfortable with plan of discharge. Return precautions discussed with patient and specified on the after visit summary.  [WS]    Clinical Course User Index [WS] Lonell Grandchild, MD     Additional history obtained: -External records from outside source obtained and reviewed including: Chart review including previous notes, labs, imaging, consultation notes including psychiatric note 07/29/22   Lab Tests: -I ordered, reviewed, and interpreted labs.   The pertinent results include:   Labs Reviewed  COMPREHENSIVE METABOLIC PANEL - Abnormal; Notable for the following components:      Result Value   Potassium 2.4 (*)    Glucose, Bld 125 (*)    Calcium 8.2 (*)    Total Protein 6.3 (*)    Albumin 3.0 (*)    AST 50 (*)    Alkaline Phosphatase 149 (*)  All  other components within normal limits  CBC WITH DIFFERENTIAL/PLATELET - Abnormal; Notable for the following components:   RBC 3.60 (*)    MCV 106.4 (*)    MCH 36.1 (*)    RDW 16.5 (*)    All other components within normal limits  BASIC METABOLIC PANEL - Abnormal; Notable for the following components:   Glucose, Bld 106 (*)    Calcium 8.1 (*)    All other components within normal limits  PROTIME-INR  BRAIN NATRIURETIC PEPTIDE  D-DIMER, QUANTITATIVE  MAGNESIUM  TROPONIN I (HIGH SENSITIVITY)  TROPONIN I (HIGH SENSITIVITY)    Notable for hypokalemia, repleted, normal troponin  EKG   EKG Interpretation  Date/Time:  Friday Sep 20 2022 15:23:39 EDT Ventricular Rate:  99 PR Interval:  128 QRS Duration: 85 QT Interval:  339 QTC Calculation: 435 R Axis:   82 Text Interpretation: Sinus rhythm Right atrial enlargement Borderline T wave abnormalities Confirmed by Alvino Blood ([id number]) on 09/20/2022 3:39:51 PM         Imaging Studies ordered: I ordered imaging studies including CXR On my interpretation imaging demonstrates no acute process I independently visualized and interpreted imaging. I agree with the radiologist interpretation   Medicines ordered and prescription drug management: Meds ordered this encounter  Medications   acetaminophen (TYLENOL) tablet 1,000 mg   potassium chloride (KLOR-CON) packet 60 mEq   magnesium sulfate IVPB 2 g 50 mL   potassium chloride (KLOR-CON) packet 60 mEq    -I have reviewed the patients home medicines and have made adjustments as needed   Cardiac Monitoring: The patient was maintained on a cardiac monitor.  I personally viewed and interpreted the cardiac monitored which showed an underlying rhythm of: NSR  Social Determinants of Health:  Diagnosis or treatment significantly limited by social determinants of health: current smoker   Reevaluation: After the interventions noted above, I reevaluated the patient and found that their  symptoms have improved  Co morbidities that complicate the patient evaluation  Past Medical History:  Diagnosis Date   Adnexal cyst 2013   right   Anemia 2015   Pernicious and iron deficiency. As of 2015/2016 no longer requiring po iron or B12 shots.    Anxiety 2020   Anxiety and ptsd   Chondromalacia 2005   left knee. arthroscopy x 2.    Colitis    COPD (chronic obstructive pulmonary disease) (HCC) 2018   Headache(784.0)    migraines   Hydradenitis 2001   axillary, multiple surgical I & Ds/excisions.   Hypertension    Nausea & vomiting 11/28/2014   Neuromuscular disorder (HCC) 2020   Peripheral neuropathy   Obesity (BMI 30.0-34.9)    Pilonidal cyst    Pityriasis rosea    pt also gives hx of seborrheic dermatitis.       Dispostion: Disposition decision including need for hospitalization was considered, and patient discharged from emergency department.    Final Clinical Impression(s) / ED Diagnoses Final diagnoses:  Atypical chest pain     This chart was dictated using voice recognition software.  Despite best efforts to proofread,  errors can occur which can change the documentation meaning.    Lonell Grandchild, MD 09/20/22 2004

## 2022-09-20 NOTE — Discharge Instructions (Signed)
We did you for your chest pain.  Your laboratory testing was reassuring including normal cardiac enzymes.  There is no sign of a blood clot on your lab test.  Your x-ray was normal.  Please take at 1000 mg of Tylenol every 6 hours for your symptoms.  Please return to the emergency department if you have any worsening symptoms or have any symptoms such as fainting or lightheadedness, difficulty breathing, vomiting, dizziness, or any other new symptoms.   We have placed a referral to cardiology.  They should call you Monday or Tuesday to schedule an expedited follow-up appointment.  Please call if you do not hear back.

## 2022-09-23 ENCOUNTER — Telehealth: Payer: Self-pay

## 2022-09-23 NOTE — Transitions of Care (Post Inpatient/ED Visit) (Unsigned)
   09/23/2022  Name: [name] MRN: [id number] DOB: [birth year]  Today's TOC FU Call Status: Today's TOC FU Call Status:: Unsuccessul Call (1st Attempt) Unsuccessful Call (1st Attempt) Date: 09/23/22  Attempted to reach the patient regarding the most recent Inpatient/ED visit.  Follow Up Plan: Additional outreach attempts will be made to reach the patient to complete the Transitions of Care (Post Inpatient/ED visit) call.   Signature Serita Sheller Lalonnie Shaffer RMA

## 2022-09-24 NOTE — Transitions of Care (Post Inpatient/ED Visit) (Signed)
   09/24/2022  Name: [name] MRN: [id number] DOB: 12/04/1975  Today's TOC FU Call Status: Today's TOC FU Call Status:: Unsuccessful Call (2nd Attempt) Unsuccessful Call (1st Attempt) Date: 09/23/22 Unsuccessful Call (2nd Attempt) Date: 09/24/22  Attempted to reach the patient regarding the most recent Inpatient/ED visit.  Follow Up Plan: Additional outreach attempts will be made to reach the patient to complete the Transitions of Care (Post Inpatient/ED visit) call.   Signature Jared Whorley RMA

## 2022-09-25 ENCOUNTER — Telehealth: Payer: Self-pay

## 2022-09-25 NOTE — Transitions of Care (Post Inpatient/ED Visit) (Signed)
   09/25/2022  Name: LOREDA KORALEWSKI MRN: [id number] DOB: 1976-02-16  Today's TOC FU Call Status: Today's TOC FU Call Status:: Unsuccessful Call (3rd Attempt) Unsuccessful Call (3rd Attempt) Date: 09/25/22  Attempted to reach the patient regarding the most recent Inpatient/ED visit.  Follow Up Plan: No further outreach attempts will be made at this time. We have been unable to contact the patient.  Signature Elon Eoff RMA

## 2022-10-16 ENCOUNTER — Encounter (HOSPITAL_COMMUNITY): Payer: Self-pay | Admitting: Psychiatry

## 2022-10-16 ENCOUNTER — Telehealth (INDEPENDENT_AMBULATORY_CARE_PROVIDER_SITE_OTHER): Payer: No Typology Code available for payment source | Admitting: Psychiatry

## 2022-10-16 ENCOUNTER — Other Ambulatory Visit (HOSPITAL_COMMUNITY): Payer: Self-pay | Admitting: Psychiatry

## 2022-10-16 DIAGNOSIS — K580 Irritable bowel syndrome with diarrhea: Secondary | ICD-10-CM

## 2022-10-16 DIAGNOSIS — F411 Generalized anxiety disorder: Secondary | ICD-10-CM

## 2022-10-16 DIAGNOSIS — F332 Major depressive disorder, recurrent severe without psychotic features: Secondary | ICD-10-CM

## 2022-10-16 MED ORDER — SERTRALINE HCL 50 MG PO TABS
50.0000 mg | ORAL_TABLET | Freq: Every day | ORAL | 3 refills | Status: AC
Start: 2022-10-16 — End: ?

## 2022-10-16 MED ORDER — DICYCLOMINE HCL 10 MG PO CAPS
10.0000 mg | ORAL_CAPSULE | Freq: Three times a day (TID) | ORAL | 3 refills | Status: DC
Start: 2022-10-16 — End: 2022-10-16

## 2022-10-16 MED ORDER — HYDROXYZINE HCL 25 MG PO TABS
25.0000 mg | ORAL_TABLET | Freq: Three times a day (TID) | ORAL | 3 refills | Status: AC | PRN
Start: 2022-10-16 — End: ?

## 2022-10-16 MED ORDER — TRAZODONE HCL 50 MG PO TABS
50.0000 mg | ORAL_TABLET | Freq: Every day | ORAL | 3 refills | Status: AC
Start: 2022-10-16 — End: ?

## 2022-10-16 NOTE — Progress Notes (Signed)
BH MD/PA/NP OP Progress Note Virtual Visit via Video Note  I connected with [name] on 10/16/22 at  2:30 PM EDT by a video enabled telemedicine application and verified that I am speaking with the correct person using two identifiers.  Location: Patient: Home Provider: Clinic   I discussed the limitations of evaluation and management by telemedicine and the availability of in person appointments. The patient expressed understanding and agreed to proceed.  I provided 30 minutes of non-face-to-face time during this encounter.   10/16/2022 2:43 PM [name]  MRN:  [id number]  Chief Complaint: "I have been stressed"  HPI: 47 year old female seen today for follow up psychiatric evaluation. She has a psychiatric history of anxiety and depression. Currently she is managed on hydroxyzine 25 mg three times daily, Trazodone 50 mg, and Zoloft 50 mg. She notes that her medications are somewhat effective in managing her psychiatric conditions.    Today she is well-groomed, tearful, engaged in conversation, and maintained eye contact.  Patient informed Clinical research associate that she has been stressed.  She notes that she continues to live with her stepfather and reports that he has taken her transportation (her deceased mother's car).  She informed Clinical research associate that she has been unable to obtain a job because she has no transportation.  Patient notes that she has a support of her daughter but notes that her daughter does not want her to live with her.  Patient also informed Clinical research associate that she and her husband are considering getting back together but currently are separated.  She notes that she and her husband are considering doing counseling for his infidelity earlier in the year.  Since her last visit she notes that her anxiety and depression has somewhat improved.  Today provider conducted a GAD-7 and patient scored a 14, at her last visit she scored 21.  She notes that she worries about her children, her health,  her marriage, transportation, and getting a job.  Provider also conducted PHQ-9 and patient scored a 20, at her last visit she scored a 24. She reports that he sleep is poor and notes that some nights she does not sleep at all.  To cope with the above patient notes that she uses edible CBD gummies. She informed Clinical research associate that she uses these to help manage her appetite which recently has been reduced due to her IBS.  Patient informed Clinical research associate that she avoids eating because she fears that she will have diarrhea episodes.  Recently she notes that she ran out of her Dicycloverine she requested that provider fill this today.  Provider was agreeable to this however informed patient that she would be to follow-up with her primary care to have further refills.  She informed Clinical research associate that she no longer has a primary care provider.  Patient referred to [facility] and wellness for primary care.  Today trazodone increased from 50 mg to 50 to 100 mg nightly as needed to help manage her sleep.  Patient informed Clinical research associate that she believes that most of her stressors are situational and notes that therapy might be more beneficial.  Patient referred to outpatient counseling for therapy.  No other concerns at this time.   IVisit Diagnosis: No diagnosis found.  Past Psychiatric History: Anxiety, depression, adjustment disorder, SI/SA, PTSD   Past Medical History:  Past Medical History:  Diagnosis Date   Adnexal cyst 2013   right   Anemia 2015   Pernicious and iron deficiency. As of 2015/2016 no longer requiring po  iron or B12 shots.    Anxiety 2020   Anxiety and ptsd   Chondromalacia 2005   left knee. arthroscopy x 2.    Colitis    COPD (chronic obstructive pulmonary disease) (HCC) 2018   Headache(784.0)    migraines   Hydradenitis 2001   axillary, multiple surgical I & Ds/excisions.   Hypertension    Nausea & vomiting 11/28/2014   Neuromuscular disorder (HCC) 2020   Peripheral neuropathy   Obesity (BMI  30.0-34.9)    Pilonidal cyst    Pityriasis rosea    pt also gives hx of seborrheic dermatitis.     Past Surgical History:  Procedure Laterality Date   ABDOMINAL HYSTERECTOMY     CYSTECTOMY     2004 axilla bil   ESOPHAGOGASTRODUODENOSCOPY N/A 11/30/2014   Procedure: ESOPHAGOGASTRODUODENOSCOPY (EGD);  Surgeon: Beverley Fiedler, MD;  Location: [facility] ENDOSCOPY;  Service: Endoscopy;  Laterality: N/A;   FRACTURE SURGERY     rt arm   GASTRIC BYPASS  05/20/2009   gastric sleeve   KNEE SURGERY  05/21/2003   left   LAPAROSCOPIC CHOLECYSTECTOMY  05/20/2000   MEDIAL PATELLOFEMORAL LIGAMENT REPAIR Left 06/08/2013   Procedure: DIAGNOSTIC OPERATIVE ARTHROSCOPY, OPEN MEDIAL PATELLA FEMORAL LIGAMENT RECONSTRUCTION;  Surgeon: Cammy Copa, MD;  Location: [facility] OR;  Service: Orthopedics;  Laterality: Left;  with Hamstring autograft   OVARIAN CYST REMOVAL     PARTIAL HYSTERECTOMY     R arm surgery      Family Psychiatric History: Unknown   Family History:  Family History  Problem Relation Age of Onset   Lupus Mother    Stroke Mother    Hypertension Mother    Arthritis Mother    Crohn's disease Father    Hypertension Father    Gallbladder disease Father    Cancer Maternal Grandfather        lung   COPD Maternal Grandfather    Emphysema Maternal Grandfather    Lung cancer Maternal Grandmother    Stroke Maternal Grandmother    Cancer Maternal Grandmother    Coronary artery disease Paternal Grandfather    Heart disease Paternal Grandfather    Stroke Paternal Grandmother     Social History:  Social History   Socioeconomic History   Marital status: Single    Spouse name: Not on file   Number of children: Not on file   Years of education: Not on file   Highest education level: Not on file  Occupational History   Occupation: Futures trader: [employer]    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   Tobacco Use   Smoking status: Every Day    Packs/day: 0.50     Years: 2.00    Additional pack years: 0.00    Total pack years: 1.00    Types: Cigarettes    Start date: 05/20/2013   Smokeless tobacco: Never  Vaping Use   Vaping Use: Some days   Substances: Nicotine, Flavoring  Substance and Sexual Activity   Alcohol use: Yes    Alcohol/week: 1.0 - 2.0 standard drink of alcohol    Types: 1 - 2 Standard drinks or equivalent per week    Comment: occ   Drug use: Not Currently    Types: Benzodiazepines, Other-see comments    Comment: Cbd and anxiety meds   Sexual activity: Yes    Partners: Male    Birth control/protection: Surgical  Other Topics Concern   Not on file  Social History  Narrative   Not on file   Social Determinants of Health   Financial Resource Strain: Not on file  Food Insecurity: Not on file  Transportation Needs: Not on file  Physical Activity: Not on file  Stress: Not on file  Social Connections: Not on file    Allergies:  Allergies  Allergen Reactions   Penicillins Anaphylaxis, Swelling and Rash    Face and throat swell    Metabolic Disorder Labs: Lab Results  Component Value Date   HGBA1C 5.5 08/16/2021   MPG 82 10/05/2020   No results found for: "PROLACTIN" Lab Results  Component Value Date   CHOL 91 11/29/2014   TRIG 48 11/29/2014   HDL 33 (L) 11/29/2014   CHOLHDL 2.8 11/29/2014   VLDL 10 11/29/2014   LDLCALC 48 11/29/2014   Lab Results  Component Value Date   TSH 2.040 08/16/2021   TSH 0.562 11/25/2020    Therapeutic Level Labs: No results found for: "LITHIUM" No results found for: "VALPROATE" No results found for: "CBMZ"  Current Medications: Current Outpatient Medications  Medication Sig Dispense Refill   albuterol (VENTOLIN HFA) 108 (90 Base) MCG/ACT inhaler Inhale 2 puffs into the lungs every 6 (six) hours as needed for wheezing or shortness of breath. 8 g 0   carbamazepine (TEGRETOL-XR) 200 MG 12 hr tablet Take 1 tablet (200 mg total) by mouth 2 (two) times daily. 60 tablet 01    cyclobenzaprine (FLEXERIL) 10 MG tablet Take 1 tablet (10 mg total) by mouth 3 (three) times daily as needed for muscle spasms (jaw pain). 30 tablet 1   gabapentin (NEURONTIN) 300 MG capsule Take 2 capsules (600 mg total) by mouth 4 (four) times daily. 270 capsule 11   hydrOXYzine (ATARAX) 25 MG tablet Take 1 tablet (25 mg total) by mouth 3 (three) times daily as needed. 90 tablet 3   ibuprofen (ADVIL) 600 MG tablet Take 1 tablet (600 mg total) by mouth every 8 (eight) hours as needed. 30 tablet 0   olmesartan-hydrochlorothiazide (BENICAR HCT) 40-12.5 MG tablet Take 2 tablets by mouth daily. 180 tablet 1   Rimegepant Sulfate (NURTEC) 75 MG TBDP Take 75 mg by mouth daily as needed (migraine). 15 tablet 3   sertraline (ZOLOFT) 50 MG tablet Take 0.5 tablets (25 mg total) by mouth at bedtime for 6 days, THEN 1 tablet (50 mg total) at bedtime thereafter. 30 tablet 3   traZODone (DESYREL) 50 MG tablet Take 1 tablet (50 mg total) by mouth at bedtime. 30 tablet 3   No current facility-administered medications for this visit.     Musculoskeletal: Strength & Muscle Tone: within normal limits and Telehealth visit Gait & Station: normal, telehealth visit Patient leans: N/A  Psychiatric Specialty Exam: Review of Systems  There were no vitals taken for this visit.There is no height or weight on file to calculate BMI.  General Appearance: Well Groomed  Eye Contact:  Good  Speech:  Clear and Coherent and Normal Rate  Volume:  Normal  Mood:  Angry and Depressed  Affect:  Appropriate and Congruent  Thought Process:  Coherent, Goal Directed, and Linear  Orientation:  Full (Time, Place, and Person)  Thought Content: WDL and Logical   Suicidal Thoughts:  No  Homicidal Thoughts:  No  Memory:  Immediate;   Good Recent;   Good Remote;   Good  Judgement:  Good  Insight:  Good  Psychomotor Activity:  Normal  Concentration:  Concentration: Good and Attention Span: Good  Recall:  Good  Fund of Knowledge:  Good  Language: Good  Akathisia:  No  Handed:  Left  AIMS (if indicated): not done  Assets:  Communication Skills Desire for Improvement Housing Leisure Time Physical Health  ADL's:  Intact  Cognition: WNL  Sleep:  Poor   Screenings: GAD-7    Advertising copywriter from 08/22/2022 in [facility] Office Visit from 07/29/2022 in [facility] Office Visit from 10/05/2020 in [facility] Primary Care & Sports Medicine at [facility]  Total GAD-7 Score 21 21 14       PHQ2-9    Flowsheet Row Counselor from 08/22/2022 in [facility] Office Visit from 07/29/2022 in [facility] Video Visit from 10/29/2021 in [facility] Primary Care & Sports Medicine at [facility] Office Visit from 08/16/2021 in [facility] Primary Care & Sports Medicine at [facility] Office Visit from 06/18/2021 in [facility] Primary [facility] Medicine at [facility]  PHQ-2 Total Score 6 6 0 1 0  PHQ-9 Total Score 24 24 -- -- --      Flowsheet Row ED from 09/20/2022 in [facility] Emergency Department at [facility] Counselor from 08/22/2022 in [facility] ED from 07/23/2022 in [facility] Emergency Department at [facility]  C-SSRS RISK CATEGORY No Risk No Risk No Risk        Assessment and Plan: Patient endorses symptoms of IBS, anxiety, depression, and insomnia.Patient informed Clinical research associate that she avoids eating because she fears that she will have diarrhea episodes when her IBS is not managed.  Recently she notes that she ran out of her Dicycloverine she requested that provider fill this today.  Provider was agreeable to this however informed patient that she would be to follow-up with her primary care to have further refills.  Today Dicycloverine 10 mg three times daily ordered. She informed Clinical research associate that she no longer has a primary care  provider.  Patient referred to community health and wellness for primary care.  Today trazodone increased from 50 mg to 50 to 100 mg nightly as needed to help manage her sleep.  Patient informed Clinical research associate that she believes that most of her stressors are situational and notes that therapy might be more beneficial.  Patient referred to outpatient counseling for therapy.  She will continue all other medications as prescribed.  1. Generalized anxiety disorder  Continue- sertraline (ZOLOFT) 50 MG tablet; Take 1 tablet (50 mg total) by mouth at bedtime.  Dispense: 30 tablet; Refill: 3 Continue- hydrOXYzine (ATARAX) 25 MG tablet; Take 1 tablet (25 mg total) by mouth 3 (three) times daily as needed.  Dispense: 90 tablet; Refill: 3 - Ambulatory referral to Internal Medicine - Ambulatory referral to Social Work  2. Severe episode of recurrent major depressive disorder, without psychotic features (HCC)  increased- traZODone (DESYREL) 50 MG tablet; Take 1-2 tablets (50-100 mg total) by mouth at bedtime.  Dispense: 60 tablet; Refill: 3 - Ambulatory referral to Internal Medicine - Ambulatory referral to Social Work  3. Irritable bowel syndrome with diarrhea  Restart- dicyclomine (BENTYL) 10 MG capsule; Take 1 capsule (10 mg total) by mouth 4 (four) times daily -  before meals and at bedtime.  Dispense: 90 capsule; Refill: 3   Collaboration of Care: Collaboration of Care: Other provider involved in patient's care AEB PCP and counselor  Patient/Guardian was advised Release of Information must be obtained prior to any record release  in order to collaborate their care with an outside provider. Patient/Guardian was advised if they have not already done so to contact the registration department to sign all necessary forms in order for [place] to release information regarding their care.   Consent: Patient/Guardian gives verbal consent for treatment and assignment of benefits for services provided during this visit.  Patient/Guardian expressed understanding and agreed to proceed.   Follow-up in 3 months working Follow-up with therapy as   Shanna Cisco, NP 10/16/2022, 2:43 PM

## 2022-10-17 ENCOUNTER — Other Ambulatory Visit (HOSPITAL_COMMUNITY): Payer: Self-pay | Admitting: Psychiatry

## 2022-10-17 DIAGNOSIS — K580 Irritable bowel syndrome with diarrhea: Secondary | ICD-10-CM

## 2022-11-01 ENCOUNTER — Ambulatory Visit
Payer: No Typology Code available for payment source | Attending: Cardiovascular Disease | Admitting: Cardiovascular Disease

## 2022-11-01 NOTE — Progress Notes (Deleted)
Cardiology Office Note:   Date:  11/01/2022  NAME:  [name]    MRN: [id number] DOB:  April 10, 1976   PCP:  Tollie Eth, NP  Cardiologist:  None  Electrophysiologist:  None   Referring MD: Tollie Eth, NP   No chief complaint on file. ***  History of Present Illness:   [name] is a 47 y.o. female with a hx of COPD, hypertension who is being seen today for the evaluation of chest pain at the request of Early, Sung Amabile, NP.  Seen in the emergency room on 09/20/2022 for chest pain.  Troponins negative.  Referred to cardiology.  Problem List COPD HTN  Past Medical History: Past Medical History:  Diagnosis Date   Adnexal cyst 2013   right   Anemia 2015   Pernicious and iron deficiency. As of 2015/2016 no longer requiring po iron or B12 shots.    Anxiety 2020   Anxiety and ptsd   Chondromalacia 2005   left knee. arthroscopy x 2.    Colitis    COPD (chronic obstructive pulmonary disease) (HCC) 2018   Headache(784.0)    migraines   Hydradenitis 2001   axillary, multiple surgical I & Ds/excisions.   Hypertension    Nausea & vomiting 11/28/2014   Neuromuscular disorder (HCC) 2020   Peripheral neuropathy   Obesity (BMI 30.0-34.9)    Pilonidal cyst    Pityriasis rosea    pt also gives hx of seborrheic dermatitis.     Past Surgical History: Past Surgical History:  Procedure Laterality Date   ABDOMINAL HYSTERECTOMY     CYSTECTOMY     2004 axilla bil   ESOPHAGOGASTRODUODENOSCOPY N/A 11/30/2014   Procedure: ESOPHAGOGASTRODUODENOSCOPY (EGD);  Surgeon: Beverley Fiedler, MD;  Location: [facility] ENDOSCOPY;  Service: Endoscopy;  Laterality: N/A;   FRACTURE SURGERY     rt arm   GASTRIC BYPASS  05/20/2009   gastric sleeve   KNEE SURGERY  05/21/2003   left   LAPAROSCOPIC CHOLECYSTECTOMY  05/20/2000   MEDIAL PATELLOFEMORAL LIGAMENT REPAIR Left 06/08/2013   Procedure: DIAGNOSTIC OPERATIVE ARTHROSCOPY, OPEN MEDIAL PATELLA FEMORAL LIGAMENT RECONSTRUCTION;  Surgeon: Cammy Copa, MD;  Location: [facility] OR;  Service: Orthopedics;  Laterality: Left;  with Hamstring autograft   OVARIAN CYST REMOVAL     PARTIAL HYSTERECTOMY     R arm surgery      Current Medications: No outpatient medications have been marked as taking for the 11/01/22 encounter (Appointment) with [name] Rives, MD.     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   Tobacco Use   Smoking status: Every Day    Packs/day: 0.50    Years: 2.00    Additional pack years: 0.00    Total pack years: 1.00    Types: Cigarettes    Start date: 05/20/2013   Smokeless tobacco: Never  Vaping Use   Vaping Use: Some days   Substances: Nicotine, Flavoring  Substance and Sexual Activity   Alcohol use: Yes    Alcohol/week: 1.0 - 2.0 standard drink of alcohol    Types: 1 -  2 Standard drinks or equivalent per week    Comment: occ   Drug use: Not Currently    Types: Benzodiazepines, Other-see comments    Comment: Cbd and anxiety meds   Sexual activity: Yes    Partners: Male    Birth control/protection: Surgical  Other Topics Concern   Not on file  Social History Narrative   Not on file   Social Determinants of Health   Financial Resource Strain: Not on file  Food Insecurity: Not on file  Transportation Needs: Not on file  Physical Activity: Not on file  Stress: Not on file  Social Connections: Not on file     Family History: The patient's ***family history includes Arthritis in her mother; COPD in her maternal grandfather; Cancer in her maternal grandfather and maternal grandmother; Coronary artery disease in her paternal grandfather; Crohn's  disease in her father; Emphysema in her maternal grandfather; Gallbladder disease in her father; Heart disease in her paternal grandfather; Hypertension in her father and mother; Lung cancer in her maternal grandmother; Lupus in her mother; Stroke in her maternal grandmother, mother, and paternal grandmother.  ROS:   All other ROS reviewed and negative. Pertinent positives noted in the HPI.     EKGs/Labs/Other Studies Reviewed:   The following studies were personally reviewed by me today:  EKG:  EKG is *** ordered today.  The ekg ordered today demonstrates ***, and was personally reviewed by me.   Recent Labs: 09/20/2022: ALT 27; B Natriuretic Peptide 41.6; BUN 8; Creatinine, Ser 0.79; Hemoglobin 13.0; Magnesium 1.7; Platelets 221; Potassium 4.7; Sodium 141   Recent Lipid Panel    Component Value Date/Time   CHOL 91 11/29/2014 0615   TRIG 48 11/29/2014 0615   HDL 33 (L) 11/29/2014 0615   CHOLHDL 2.8 11/29/2014 0615   VLDL 10 11/29/2014 0615   LDLCALC 48 11/29/2014 0615    Physical Exam:   VS:  There were no vitals taken for this visit.   Wt Readings from Last 3 Encounters:  09/20/22 195 lb 1.7 oz (88.5 kg)  07/23/22 195 lb (88.5 kg)  07/19/22 200 lb (90.7 kg)    General: Well nourished, well developed, in no acute distress Head: Atraumatic, normal size  Eyes: PEERLA, EOMI  Neck: Supple, no JVD Endocrine: No thryomegaly Cardiac: Normal S1, S2; RRR; no murmurs, rubs, or gallops Lungs: Clear to auscultation bilaterally, no wheezing, rhonchi or rales  Abd: Soft, nontender, no hepatomegaly  Ext: No edema, pulses 2+ Musculoskeletal: No deformities, BUE and BLE strength normal and equal Skin: Warm and dry, no rashes   Neuro: Alert and oriented to person, place, time, and situation, CNII-XII grossly intact, no focal deficits  Psych: Normal mood and affect   ASSESSMENT:   [name] is a 47 y.o. female who presents for the following: No diagnosis found.  PLAN:   There  are no diagnoses linked to this encounter.  {Are you ordering a CV Procedure (e.g. stress test, cath, DCCV, TEE, etc)?   Press F2        :[id number]}  Disposition: No follow-ups on file.  Medication Adjustments/Labs and Tests Ordered: Current medicines are reviewed at length with the patient today.  Concerns regarding medicines are outlined above.  No orders of the defined types were placed in this encounter.  No orders of the defined types were placed in this encounter.   There are no Patient Instructions on file for this visit.   Time Spent with Patient: I have spent  a total of *** minutes with patient reviewing hospital notes, telemetry, EKGs, labs and examining the patient as well as establishing an assessment and plan that was discussed with the patient.  > 50% of time was spent in direct patient care.  Signed, Lenna Gilford. Flora Lipps, MD, [facility]  [facility]  [address] [place], [place] [address] [phone]  11/01/2022 8:33 AM

## 2022-12-24 ENCOUNTER — Encounter (HOSPITAL_COMMUNITY): Payer: Self-pay

## 2022-12-25 ENCOUNTER — Ambulatory Visit: Payer: No Typology Code available for payment source | Attending: Cardiovascular Disease | Admitting: Internal Medicine

## 2022-12-31 ENCOUNTER — Encounter: Payer: Self-pay | Admitting: General Practice

## 2023-01-08 ENCOUNTER — Telehealth (HOSPITAL_COMMUNITY): Payer: No Typology Code available for payment source | Admitting: Psychiatry

## 2023-01-09 ENCOUNTER — Encounter (HOSPITAL_COMMUNITY): Payer: Self-pay

## 2023-01-09 ENCOUNTER — Telehealth (HOSPITAL_COMMUNITY): Payer: No Typology Code available for payment source | Admitting: Psychiatry

## 2023-01-29 ENCOUNTER — Other Ambulatory Visit (HOSPITAL_COMMUNITY): Payer: Self-pay | Admitting: Psychiatry

## 2023-01-29 DIAGNOSIS — K580 Irritable bowel syndrome with diarrhea: Secondary | ICD-10-CM

## 2023-04-15 ENCOUNTER — Other Ambulatory Visit (HOSPITAL_COMMUNITY): Payer: Self-pay | Admitting: Psychiatry

## 2023-04-15 DIAGNOSIS — K580 Irritable bowel syndrome with diarrhea: Secondary | ICD-10-CM

## 2023-07-17 DIAGNOSIS — E785 Hyperlipidemia, unspecified: Secondary | ICD-10-CM | POA: Diagnosis not present

## 2023-07-17 DIAGNOSIS — B370 Candidal stomatitis: Secondary | ICD-10-CM | POA: Diagnosis not present

## 2023-07-17 DIAGNOSIS — F413 Other mixed anxiety disorders: Secondary | ICD-10-CM | POA: Diagnosis not present

## 2023-07-17 DIAGNOSIS — I10 Essential (primary) hypertension: Secondary | ICD-10-CM | POA: Diagnosis not present

## 2023-07-17 DIAGNOSIS — D518 Other vitamin B12 deficiency anemias: Secondary | ICD-10-CM | POA: Diagnosis not present

## 2023-07-17 DIAGNOSIS — F17210 Nicotine dependence, cigarettes, uncomplicated: Secondary | ICD-10-CM | POA: Diagnosis not present

## 2023-09-30 ENCOUNTER — Other Ambulatory Visit: Payer: Self-pay

## 2023-09-30 ENCOUNTER — Emergency Department (HOSPITAL_BASED_OUTPATIENT_CLINIC_OR_DEPARTMENT_OTHER)
Admission: EM | Admit: 2023-09-30 | Discharge: 2023-09-30 | Disposition: A | Discharge status: Home / Self Care | Attending: Emergency Medicine | Admitting: Emergency Medicine

## 2023-09-30 ENCOUNTER — Encounter (HOSPITAL_BASED_OUTPATIENT_CLINIC_OR_DEPARTMENT_OTHER): Payer: Self-pay | Admitting: Emergency Medicine

## 2023-09-30 DIAGNOSIS — Z79899 Other long term (current) drug therapy: Secondary | ICD-10-CM | POA: Diagnosis not present

## 2023-09-30 DIAGNOSIS — L0291 Cutaneous abscess, unspecified: Secondary | ICD-10-CM

## 2023-09-30 DIAGNOSIS — L02416 Cutaneous abscess of left lower limb: Secondary | ICD-10-CM | POA: Diagnosis present

## 2023-09-30 DIAGNOSIS — I10 Essential (primary) hypertension: Secondary | ICD-10-CM | POA: Diagnosis not present

## 2023-09-30 DIAGNOSIS — J449 Chronic obstructive pulmonary disease, unspecified: Secondary | ICD-10-CM | POA: Diagnosis not present

## 2023-09-30 DIAGNOSIS — Z7951 Long term (current) use of inhaled steroids: Secondary | ICD-10-CM | POA: Insufficient documentation

## 2023-09-30 LAB — CBC WITH DIFFERENTIAL/PLATELET
Abs Immature Granulocytes: 0.02 10*3/uL (ref 0.00–0.07)
Basophils Absolute: 0 10*3/uL (ref 0.0–0.1)
Basophils Relative: 1 %
Eosinophils Absolute: 0.1 10*3/uL (ref 0.0–0.5)
Eosinophils Relative: 1 %
HCT: 42.9 % (ref 36.0–46.0)
Hemoglobin: 14.5 g/dL (ref 12.0–15.0)
Immature Granulocytes: 0 %
Lymphocytes Relative: 48 %
Lymphs Abs: 3.2 10*3/uL (ref 0.7–4.0)
MCH: 31.7 pg (ref 26.0–34.0)
MCHC: 33.8 g/dL (ref 30.0–36.0)
MCV: 93.7 fL (ref 80.0–100.0)
Monocytes Absolute: 0.4 10*3/uL (ref 0.1–1.0)
Monocytes Relative: 6 %
Neutro Abs: 2.8 10*3/uL (ref 1.7–7.7)
Neutrophils Relative %: 44 %
Platelets: 304 10*3/uL (ref 150–400)
RBC: 4.58 MIL/uL (ref 3.87–5.11)
RDW: 14.3 % (ref 11.5–15.5)
WBC: 6.5 10*3/uL (ref 4.0–10.5)
nRBC: 0 % (ref 0.0–0.2)

## 2023-09-30 LAB — COMPREHENSIVE METABOLIC PANEL WITH GFR
ALT: 12 U/L (ref 0–44)
AST: 23 U/L (ref 15–41)
Albumin: 3.7 g/dL (ref 3.5–5.0)
Alkaline Phosphatase: 133 U/L — ABNORMAL HIGH (ref 38–126)
Anion gap: 11 (ref 5–15)
BUN: 8 mg/dL (ref 6–20)
CO2: 25 mmol/L (ref 22–32)
Calcium: 9.5 mg/dL (ref 8.9–10.3)
Chloride: 106 mmol/L (ref 98–111)
Creatinine, Ser: 0.98 mg/dL (ref 0.44–1.00)
GFR, Estimated: >60 mL/min (ref >60)
Glucose, Bld: 80 mg/dL (ref 70–99)
Potassium: 3.9 mmol/L (ref 3.5–5.1)
Sodium: 142 mmol/L (ref 135–145)
Total Bilirubin: 0.6 mg/dL (ref 0.0–1.2)
Total Protein: 6.8 g/dL (ref 6.5–8.1)

## 2023-09-30 LAB — LACTIC ACID, PLASMA: Lactic Acid, Venous: 1.1 mmol/L (ref 0.5–1.9)

## 2023-09-30 MED ORDER — OXYCODONE-ACETAMINOPHEN 5-325 MG PO TABS: 1.0000 | ORAL_TABLET | Freq: Once | ORAL | Status: DC

## 2023-09-30 MED ORDER — LIDOCAINE HCL (PF) 1 % IJ SOLN
5.0000 mL | Freq: Once | INTRAMUSCULAR | Status: AC
  Administered 2023-09-30: 5 mL
  Filled 2023-09-30: qty 5

## 2023-09-30 MED ORDER — DOXYCYCLINE HYCLATE 100 MG PO TABS
100.0000 mg | ORAL_TABLET | Freq: Once | ORAL | Status: AC
  Administered 2023-09-30: 100 mg via ORAL
  Filled 2023-09-30: qty 1

## 2023-09-30 MED ORDER — DOXYCYCLINE HYCLATE 100 MG PO CAPS: 100.0000 mg | ORAL_CAPSULE | Freq: Two times a day (BID) | ORAL | 0 refills | Status: AC

## 2023-09-30 MED ORDER — MORPHINE SULFATE (PF) 4 MG/ML IV SOLN
4.0000 mg | Freq: Once | INTRAVENOUS | Status: AC
  Administered 2023-09-30: 4 mg via INTRAMUSCULAR
  Filled 2023-09-30: qty 1

## 2023-09-30 NOTE — Discharge Instructions (Addendum)
 Take doxycycline  twice a day for the next week.  Take this medication with food as it can cause nausea/vomiting if taken on empty stomach.  Please complete full course of antibiotics.  It takes about 2 days for antibiotics to kick in.  Follow-up with your primary care provider in the next week for reevaluation.  Return to the ED if symptoms worsen in the interim.

## 2023-09-30 NOTE — ED Provider Notes (Signed)
 [place] EMERGENCY DEPARTMENT AT [facility] Provider Note   CSN: [id number] Arrival date & time: 09/30/23  1420     History  Chief Complaint  Patient presents with   Abscess    [name] is a 48 y.o. female with a history of COPD, hypertension, and hidradenitis who presents the ED today for an abscess.  Patient reports that she has had hidradenitis on the left inner thigh for the past week with subjective fevers, chills, and fatigue.  States that it hasn't gone away on its own and the pain has continue to increase.  No additional complaints or concerns at this time.    Home Medications Prior to Admission medications   Medication Sig Start Date End Date Taking? Authorizing Provider  doxycycline  (VIBRAMYCIN ) 100 MG capsule Take 1 capsule (100 mg total) by mouth 2 (two) times daily for 7 days. 09/30/23 10/07/23 Yes Sonnie Dusky, PA-C  albuterol  (VENTOLIN  HFA) 108 (90 Base) MCG/ACT inhaler Inhale 2 puffs into the lungs every 6 (six) hours as needed for wheezing or shortness of breath. 11/07/21   Mardene Shake, FNP  carbamazepine  (TEGRETOL -XR) 200 MG 12 hr tablet Take 1 tablet (200 mg total) by mouth 2 (two) times daily. 07/03/21   Adela Holter, DO  cyclobenzaprine  (FLEXERIL ) 10 MG tablet Take 1 tablet (10 mg total) by mouth 3 (three) times daily as needed for muscle spasms (jaw pain). 06/18/21   Adela Holter, DO  dicyclomine  (BENTYL ) 10 MG capsule TAKE 1 CAPSULE(10 MG) BY MOUTH FOUR TIMES DAILY BEFORE MEALS AND AT BEDTIME 10/17/22   Ardena Koyanagi E, NP  gabapentin  (NEURONTIN ) 300 MG capsule Take 2 capsules (600 mg total) by mouth 4 (four) times daily. 09/07/21   Early, Sara E, NP  hydrOXYzine  (ATARAX ) 25 MG tablet Take 1 tablet (25 mg total) by mouth 3 (three) times daily as needed. 10/16/22   Arlyne Bering, NP  ibuprofen  (ADVIL ) 600 MG tablet Take 1 tablet (600 mg total) by mouth every 8 (eight) hours as needed. 06/02/21   Fleming, Zelda W, NP   olmesartan -hydrochlorothiazide  (BENICAR  HCT) 40-12.5 MG tablet Take 2 tablets by mouth daily. 07/31/21   Adela Holter, DO  Rimegepant Sulfate (NURTEC) 75 MG TBDP Take 75 mg by mouth daily as needed (migraine). 12/27/20   Adela Holter, DO  sertraline  (ZOLOFT ) 50 MG tablet Take 1 tablet (50 mg total) by mouth at bedtime. 10/16/22   Arlyne Bering, NP  traZODone  (DESYREL ) 50 MG tablet Take 1-2 tablets (50-100 mg total) by mouth at bedtime. 10/16/22   Arlyne Bering, NP      Allergies    Penicillins    Review of Systems   Review of Systems  Skin:  Positive for wound.  All other systems reviewed and are negative.   Physical Exam Updated Vital Signs BP 128/83   Pulse 79   Temp 98.5 F (36.9 C) (Oral)   Resp 16   SpO2 98%  Physical Exam Vitals and nursing note reviewed.  Constitutional:      General: She is not in acute distress.    Appearance: Normal appearance.  HENT:     Head: Normocephalic and atraumatic.     Mouth/Throat:     Mouth: Mucous membranes are moist.  Eyes:     Conjunctiva/sclera: Conjunctivae normal.     Pupils: Pupils are equal, round, and reactive to light.  Cardiovascular:     Rate and Rhythm: Normal rate and regular rhythm.     Pulses: Normal  pulses.  Pulmonary:     Effort: Pulmonary effort is normal.     Breath sounds: Normal breath sounds.  Abdominal:     Palpations: Abdomen is soft.     Tenderness: There is no abdominal tenderness.  Skin:    General: Skin is warm and dry.     Findings: Erythema present. No rash.     Comments: Abscess present on the medial left upper thigh with induration, erythema, and warmth to touch.  No drainage.  Neurological:     General: No focal deficit present.     Mental Status: She is alert.  Psychiatric:        Mood and Affect: Mood normal.        Behavior: Behavior normal.    ED Results / Procedures / Treatments   Labs (all labs ordered are listed, but only abnormal results are displayed) Labs Reviewed   COMPREHENSIVE METABOLIC PANEL WITH GFR - Abnormal; Notable for the following components:      Result Value   Alkaline Phosphatase 133 (*)    All other components within normal limits  LACTIC ACID, PLASMA  CBC WITH DIFFERENTIAL/PLATELET    EKG None  Radiology No results found.  Procedures .Incision and Drainage  Date/Time: 09/30/2023 7:08 PM  Performed by: Sonnie Dusky, PA-C Authorized by: Sonnie Dusky, PA-C   Consent:    Consent obtained:  Verbal   Consent given by:  Patient   Risks discussed:  Bleeding and incomplete drainage Location:    Type:  Abscess   Location:  Lower extremity   Lower extremity location:  Leg   Leg location:  L upper leg Pre-procedure details:    Skin preparation:  Chlorhexidine with alcohol Procedure type:    Complexity:  Simple Procedure details:    Incision types:  Stab incision   Drainage:  Bloody and serosanguinous   Packing materials:  None Post-procedure details:    Procedure completion:  Tolerated     Medications Ordered in ED Medications  lidocaine  (PF) (XYLOCAINE ) 1 % injection 5 mL (5 mLs Infiltration Given 09/30/23 1758)  morphine  (PF) 4 MG/ML injection 4 mg (4 mg Intramuscular Given 09/30/23 1759)  doxycycline  (VIBRA -TABS) tablet 100 mg (100 mg Oral Given 09/30/23 1914)    ED Course/ Medical Decision Making/ A&P                                 Medical Decision Making Amount and/or Complexity of Data Reviewed Labs: ordered.  Risk Prescription drug management.   This patient presents to the ED for concern of abscess, this involves an extensive number of treatment options, and is a complaint that carries with it a high risk of complications and morbidity.   Differential diagnosis includes: cellulitis, abscess, etc.   Comorbidities  See HPI above   Additional History  Additional history obtained from prior records   Lab Tests  I ordered and personally interpreted labs.  The pertinent results include:   CMP  and CBC are reassuring no signs of infection Negative lactic acid   Problem List / ED Course / Critical Interventions / Medication Management  Patient has a history of hidradenitis and abscesses and have an abscess on the left upper medial thigh that developed about a week ago. She endorses subjective fevers as well.  Had serosanguineous discharge when she tried to lance it herself at home. I ordered medications including: Morphine  for pain Morphine  helped somewhat.  There is serosanguineous and bloody discharge when expressing abscess.  Patient started on doxycycline .  First dose given in the ED tonight. I have reviewed the patients home medicines and have made adjustments as needed   Social Determinants of Health  Tobacco use   Test / Admission - Considered  Patient is stable and safe discharge home. Return precautions given.       Final Clinical Impression(s) / ED Diagnoses Final diagnoses:  Abscess    Rx / DC Orders ED Discharge Orders          Ordered    doxycycline  (VIBRAMYCIN ) 100 MG capsule  2 times daily        09/30/23 1909              Sonnie Dusky, PA-C 09/30/23 1919    Deatra Face, MD 10/01/23 1352

## 2023-09-30 NOTE — ED Triage Notes (Addendum)
 Hidradenitis of left thigh. Feeling chills, fatigue. Since last week. 101 fever at home.

## 2023-12-12 ENCOUNTER — Encounter (HOSPITAL_BASED_OUTPATIENT_CLINIC_OR_DEPARTMENT_OTHER): Payer: Self-pay | Admitting: Emergency Medicine

## 2023-12-12 ENCOUNTER — Other Ambulatory Visit: Payer: Self-pay

## 2023-12-12 ENCOUNTER — Emergency Department (HOSPITAL_BASED_OUTPATIENT_CLINIC_OR_DEPARTMENT_OTHER)
Admission: EM | Admit: 2023-12-12 | Discharge: 2023-12-12 | Disposition: A | Discharge status: Home / Self Care | Attending: Emergency Medicine | Admitting: Emergency Medicine

## 2023-12-12 DIAGNOSIS — W268XXA Contact with other sharp object(s), not elsewhere classified, initial encounter: Secondary | ICD-10-CM | POA: Diagnosis not present

## 2023-12-12 DIAGNOSIS — S51811A Laceration without foreign body of right forearm, initial encounter: Secondary | ICD-10-CM | POA: Diagnosis not present

## 2023-12-12 DIAGNOSIS — Z79899 Other long term (current) drug therapy: Secondary | ICD-10-CM | POA: Diagnosis not present

## 2023-12-12 DIAGNOSIS — S59911A Unspecified injury of right forearm, initial encounter: Secondary | ICD-10-CM | POA: Diagnosis present

## 2023-12-12 NOTE — ED Provider Notes (Signed)
 [place] EMERGENCY DEPARTMENT AT [facility] Provider Note   CSN: [id number] Arrival date & time: 12/12/23  1330     Patient presents with: Laceration   [name] is a 48 y.o. female with medical history of hidradenitis suppurativa, abdominal pain, chronic low back pain, generalized anxiety disorder, chronic fatigue.  Patient presents to ED for evaluation of laceration to right forearm.  States she was using a box cutter prior to arrival when the box cutter slipped and she cut her right forearm.  She states she is up-to-date on her tetanus.  She denies any other concerns.  She is here requesting that the wound be glued shut.    Laceration      Prior to Admission medications   Medication Sig Start Date End Date Taking? Authorizing Provider  albuterol  (VENTOLIN  HFA) 108 (90 Base) MCG/ACT inhaler Inhale 2 puffs into the lungs every 6 (six) hours as needed for wheezing or shortness of breath. 11/07/21   Kennyth Domino, FNP  carbamazepine  (TEGRETOL -XR) 200 MG 12 hr tablet Take 1 tablet (200 mg total) by mouth 2 (two) times daily. 07/03/21   Alvia Bring, DO  cyclobenzaprine  (FLEXERIL ) 10 MG tablet Take 1 tablet (10 mg total) by mouth 3 (three) times daily as needed for muscle spasms (jaw pain). 06/18/21   Alvia Bring, DO  dicyclomine  (BENTYL ) 10 MG capsule TAKE 1 CAPSULE(10 MG) BY MOUTH FOUR TIMES DAILY BEFORE MEALS AND AT BEDTIME 10/17/22   Harl Regan E, NP  gabapentin  (NEURONTIN ) 300 MG capsule Take 2 capsules (600 mg total) by mouth 4 (four) times daily. 09/07/21   Early, Sara E, NP  hydrOXYzine  (ATARAX ) 25 MG tablet Take 1 tablet (25 mg total) by mouth 3 (three) times daily as needed. 10/16/22   Harl Regan BRAVO, NP  ibuprofen  (ADVIL ) 600 MG tablet Take 1 tablet (600 mg total) by mouth every 8 (eight) hours as needed. 06/02/21   Fleming, Zelda W, NP  olmesartan -hydrochlorothiazide  (BENICAR  HCT) 40-12.5 MG tablet Take 2 tablets by mouth daily. 07/31/21   Alvia Bring, DO  Rimegepant Sulfate (NURTEC) 75 MG TBDP Take 75 mg by mouth daily as needed (migraine). 12/27/20   Alvia Bring, DO  sertraline  (ZOLOFT ) 50 MG tablet Take 1 tablet (50 mg total) by mouth at bedtime. 10/16/22   Harl Regan BRAVO, NP  traZODone  (DESYREL ) 50 MG tablet Take 1-2 tablets (50-100 mg total) by mouth at bedtime. 10/16/22   Harl Regan BRAVO, NP    Allergies: Penicillins    Review of Systems  All other systems reviewed and are negative.   Updated Vital Signs BP (!) 156/97   Pulse 83   Temp 99 F (37.2 C)   Resp 16   SpO2 96%   Physical Exam Vitals and nursing note reviewed.  Constitutional:      General: She is not in acute distress.    Appearance: She is well-developed.  HENT:     Head: Normocephalic and atraumatic.  Eyes:     Conjunctiva/sclera: Conjunctivae normal.  Cardiovascular:     Rate and Rhythm: Normal rate and regular rhythm.     Heart sounds: No murmur heard. Pulmonary:     Effort: Pulmonary effort is normal. No respiratory distress.     Breath sounds: Normal breath sounds.  Abdominal:     Palpations: Abdomen is soft.     Tenderness: There is no abdominal tenderness.  Musculoskeletal:        General: No swelling.     Cervical  back: Neck supple.  Skin:    General: Skin is warm and dry.     Capillary Refill: Capillary refill takes less than 2 seconds.     Comments: 4 cm linear laceration to distal right volar surface of forearm  Neurological:     Mental Status: She is alert.  Psychiatric:        Mood and Affect: Mood normal.       (all labs ordered are listed, but only abnormal results are displayed) Labs Reviewed - No data to display  EKG: None  Radiology: No results found.   .Laceration Repair  Date/Time: 12/12/2023 2:48 PM  Performed by: Ruthell Lonni FALCON, PA-C Authorized by: Ruthell Lonni FALCON, PA-C   Consent:    Consent obtained:  Verbal   Consent given by:  Patient   Risks discussed:  Infection, nerve  damage, need for additional repair, pain, poor cosmetic result, poor wound healing, retained foreign body, vascular damage and tendon damage   Alternatives discussed:  No treatment Universal protocol:    Patient identity confirmed:  Arm band and verbally with patient Anesthesia:    Anesthesia method:  None Laceration details:    Location:  Shoulder/arm   Shoulder/arm location:  R lower arm   Length (cm):  4 Exploration:    Hemostasis achieved with:  Direct pressure   Contaminated: no   Treatment:    Area cleansed with:  Soap and water   Amount of cleaning:  Standard Skin repair:    Repair method:  Tissue adhesive Approximation:    Approximation:  Close Repair type:    Repair type:  Simple Post-procedure details:    Dressing:  Non-adherent dressing   Procedure completion:  Tolerated well, no immediate complications    Medications Ordered in the ED - No data to display   Medical Decision Making  48 year old female presents for evaluation.  Please see HPI for further details.   On exam patient has 4 cm linear laceration to the distal volar surface of right forearm.  This was repaired with Dermabond.  Patient tolerated procedure well.  She has 5 out of 5 strength to her bilateral upper extremities.  She has a plus radial pulse of the right hand.  She has brisk cap refill.  Neurovascular intact.  Patient was given return precautions and she voiced understanding.  Was advised to follow-up with her PCP.  Advised that glue would flake off on its own.  Stable to discharge.   Final diagnoses:  Laceration of right forearm, initial encounter    ED Discharge Orders     None          Ruthell Lonni FALCON, PA-C 12/12/23 1450    Mannie Pac T, DO 12/13/23 0715

## 2023-12-12 NOTE — ED Triage Notes (Signed)
 Cut self with box cutter while opening a box Right forearm Happened around 1pm Tetanus UTD per patient

## 2023-12-12 NOTE — Discharge Instructions (Signed)
 It was a pleasure taking part in your care.  As we discussed, the glue will flake off on its own over 10 days.  Please follow-up with your PCP for reevaluation.  Return to the ED with any new symptoms.  Read attached Concerning nonsutured laceration care.

## 2023-12-17 ENCOUNTER — Other Ambulatory Visit: Payer: Self-pay

## 2023-12-17 ENCOUNTER — Emergency Department (HOSPITAL_BASED_OUTPATIENT_CLINIC_OR_DEPARTMENT_OTHER): Admission: EM | Admit: 2023-12-17 | Discharge: 2023-12-17 | Disposition: A | Discharge status: Home / Self Care

## 2023-12-17 ENCOUNTER — Encounter (HOSPITAL_BASED_OUTPATIENT_CLINIC_OR_DEPARTMENT_OTHER): Payer: Self-pay

## 2023-12-17 DIAGNOSIS — H05223 Edema of bilateral orbit: Secondary | ICD-10-CM | POA: Diagnosis not present

## 2023-12-17 DIAGNOSIS — R42 Dizziness and giddiness: Secondary | ICD-10-CM | POA: Diagnosis present

## 2023-12-17 DIAGNOSIS — H5789 Other specified disorders of eye and adnexa: Secondary | ICD-10-CM

## 2023-12-17 LAB — COMPREHENSIVE METABOLIC PANEL WITH GFR
ALT: 15 U/L (ref 0–44)
AST: 24 U/L (ref 15–41)
Albumin: 3.8 g/dL (ref 3.5–5.0)
Alkaline Phosphatase: 136 U/L — ABNORMAL HIGH (ref 38–126)
Anion gap: 10 (ref 5–15)
BUN: 11 mg/dL (ref 6–20)
CO2: 26 mmol/L (ref 22–32)
Calcium: 9.3 mg/dL (ref 8.9–10.3)
Chloride: 107 mmol/L (ref 98–111)
Creatinine, Ser: 1.02 mg/dL — ABNORMAL HIGH (ref 0.44–1.00)
GFR, Estimated: >60 mL/min (ref >60)
Glucose, Bld: 98 mg/dL (ref 70–99)
Potassium: 4.3 mmol/L (ref 3.5–5.1)
Sodium: 143 mmol/L (ref 135–145)
Total Bilirubin: 0.8 mg/dL (ref 0.0–1.2)
Total Protein: 7.2 g/dL (ref 6.5–8.1)

## 2023-12-17 LAB — CBC
HCT: 42.9 % (ref 36.0–46.0)
Hemoglobin: 14.1 g/dL (ref 12.0–15.0)
MCH: 31.6 pg (ref 26.0–34.0)
MCHC: 32.9 g/dL (ref 30.0–36.0)
MCV: 96.2 fL (ref 80.0–100.0)
Platelets: 264 K/uL (ref 150–400)
RBC: 4.46 MIL/uL (ref 3.87–5.11)
RDW: 13.7 % (ref 11.5–15.5)
WBC: 6.9 K/uL (ref 4.0–10.5)
nRBC: 0 % (ref 0.0–0.2)

## 2023-12-17 LAB — CBG MONITORING, ED: Glucose-Capillary: 71 mg/dL (ref 70–99)

## 2023-12-17 MED ORDER — OXYCODONE-ACETAMINOPHEN 5-325 MG PO TABS
1.0000 | ORAL_TABLET | Freq: Once | ORAL | Status: AC
  Administered 2023-12-17: 1 via ORAL
  Filled 2023-12-17: qty 1

## 2023-12-17 MED ORDER — OXYCODONE-ACETAMINOPHEN 5-325 MG PO TABS: 1.0000 | ORAL_TABLET | Freq: Four times a day (QID) | ORAL | 0 refills | Status: AC | PRN

## 2023-12-17 MED ORDER — DIPHENHYDRAMINE HCL 25 MG PO CAPS
25.0000 mg | ORAL_CAPSULE | Freq: Once | ORAL | Status: AC
  Administered 2023-12-17: 25 mg via ORAL
  Filled 2023-12-17: qty 1

## 2023-12-17 MED ORDER — OLOPATADINE HCL 0.1 % OP SOLN: 1.0000 [drp] | Freq: Two times a day (BID) | OPHTHALMIC | Status: DC

## 2023-12-17 MED ORDER — OLOPATADINE HCL 0.1 % OP SOLN: 1.0000 [drp] | Freq: Two times a day (BID) | OPHTHALMIC | 12 refills | Status: AC

## 2023-12-17 NOTE — ED Notes (Signed)
 Dc instructions given, pt verbalized understanding.out of ED with all belongings and paperwork with steady gait, not in visible distress.

## 2023-12-17 NOTE — Discharge Instructions (Signed)
 As we discussed, call your eye doctor in the morning to be seen tomorrow for your current symptoms. Use the eye drops for symptomatic relief. Percocet for pain as needed.   Return to the ED with any new or concerning symptoms.

## 2023-12-17 NOTE — ED Triage Notes (Signed)
 Patient reports dizziness, lightheaded, headache, and inability to fully open her eyes. She also states that she was diagnosed with acousticneromas and thinks it may be causing this so she wanted to get checked out.

## 2023-12-17 NOTE — ED Provider Notes (Signed)
 [place] EMERGENCY DEPARTMENT AT [facility] Provider Note   CSN: [id number] Arrival date & time: 12/17/23  1644     Patient presents with: Dizziness   [name] is a 48 y.o. female.   Patient to ED with progressively worsening bilateral eye redness and lid swelling without pain, started on the right and then the left. Symptoms started over the last 24 hours. She reports light sensitivity that has gotten worse and is now causing a frontal headache. No other allergy symptoms. Her eyes have been tearing but no purulent drainage. No vision change.   The history is provided by the patient. No language interpreter was used.  Dizziness      Prior to Admission medications   Medication Sig Start Date End Date Taking? Authorizing Provider  olopatadine  (PATANOL) 0.1 % ophthalmic solution Place 1 drop into both eyes 2 (two) times daily. 12/17/23  Yes Hazel Leveille, Margit, PA-C  oxyCODONE -acetaminophen  (PERCOCET/ROXICET) 5-325 MG tablet Take 1 tablet by mouth every 6 (six) hours as needed for severe pain (pain score 7-10). 12/17/23  Yes Shiori Adcox, Margit, PA-C  albuterol  (VENTOLIN  HFA) 108 (90 Base) MCG/ACT inhaler Inhale 2 puffs into the lungs every 6 (six) hours as needed for wheezing or shortness of breath. 11/07/21   Kennyth Domino, FNP  carbamazepine  (TEGRETOL -XR) 200 MG 12 hr tablet Take 1 tablet (200 mg total) by mouth 2 (two) times daily. 07/03/21   Alvia Bring, DO  cyclobenzaprine  (FLEXERIL ) 10 MG tablet Take 1 tablet (10 mg total) by mouth 3 (three) times daily as needed for muscle spasms (jaw pain). 06/18/21   Alvia Bring, DO  dicyclomine  (BENTYL ) 10 MG capsule TAKE 1 CAPSULE(10 MG) BY MOUTH FOUR TIMES DAILY BEFORE MEALS AND AT BEDTIME 10/17/22   Harl Regan E, NP  gabapentin  (NEURONTIN ) 300 MG capsule Take 2 capsules (600 mg total) by mouth 4 (four) times daily. 09/07/21   Early, Sara E, NP  hydrOXYzine  (ATARAX ) 25 MG tablet Take 1 tablet (25 mg total) by mouth 3 (three)  times daily as needed. 10/16/22   Harl Regan BRAVO, NP  ibuprofen  (ADVIL ) 600 MG tablet Take 1 tablet (600 mg total) by mouth every 8 (eight) hours as needed. 06/02/21   Fleming, Zelda W, NP  olmesartan -hydrochlorothiazide  (BENICAR  HCT) 40-12.5 MG tablet Take 2 tablets by mouth daily. 07/31/21   Alvia Bring, DO  Rimegepant Sulfate (NURTEC) 75 MG TBDP Take 75 mg by mouth daily as needed (migraine). 12/27/20   Alvia Bring, DO  sertraline  (ZOLOFT ) 50 MG tablet Take 1 tablet (50 mg total) by mouth at bedtime. 10/16/22   Harl Regan BRAVO, NP  traZODone  (DESYREL ) 50 MG tablet Take 1-2 tablets (50-100 mg total) by mouth at bedtime. 10/16/22   Harl Regan BRAVO, NP    Allergies: Penicillins    Review of Systems  Neurological:  Positive for dizziness.    Updated Vital Signs BP (!) 215/117   Pulse 62   Temp 98.3 F (36.8 C)   Resp 20   SpO2 98%   Physical Exam Constitutional:      General: She is not in acute distress.    Appearance: Normal appearance. She is well-developed. She is not ill-appearing.  Eyes:     Comments: Sclera injected bilateral eyes. Excessive tearing without purulence. No conjunctival edema or chemosis. Cornea clear bilaterally. Lids are swollen without redness or suggestion of cellulitis/infection. Globes are nontender and soft.   Cardiovascular:     Rate and Rhythm: Normal rate.  Pulmonary:  Effort: Pulmonary effort is normal.  Musculoskeletal:        General: Normal range of motion.     Cervical back: Normal range of motion.  Skin:    General: Skin is warm and dry.  Neurological:     Mental Status: She is alert and oriented to person, place, and time.     (all labs ordered are listed, but only abnormal results are displayed) Labs Reviewed  COMPREHENSIVE METABOLIC PANEL WITH GFR - Abnormal; Notable for the following components:      Result Value   Creatinine, Ser 1.02 (*)    Alkaline Phosphatase 136 (*)    All other components within normal  limits  CBC  URINALYSIS, ROUTINE W REFLEX MICROSCOPIC  CBG MONITORING, ED    EKG: None  Radiology: No results found.   Procedures   Medications Ordered in the ED  olopatadine  (PATANOL) 0.1 % ophthalmic solution 1 drop (has no administration in time range)  diphenhydrAMINE  (BENADRYL ) capsule 25 mg (25 mg Oral Given 12/17/23 2126)  oxyCODONE -acetaminophen  (PERCOCET/ROXICET) 5-325 MG per tablet 1 tablet (1 tablet Oral Given 12/17/23 2126)    Clinical Course as of 12/17/23 2137  Wed Dec 17, 2023  2124 Patient with swelling of both eye, excessive tearing without purulence, light sensitivity.  [SU]  2130 Sees an ophthalmologist for floaters, visual changes, but no history of glaucoma. She will contact them in the morning. Rx Patanol, Percocet provided. Discussed elevated blood pressure. She reports she taking pressure medicine at night and has not taken it yet. Discussed with Dr. Gennaro. She would like to be discharged home to outpatient follow up.  [SU]    Clinical Course User Index [SU] Odell Balls, PA-C                                 Medical Decision Making Amount and/or Complexity of Data Reviewed Labs: ordered.  Risk Prescription drug management.        Final diagnoses:  Eye swelling, bilateral    ED Discharge Orders          Ordered    olopatadine  (PATANOL) 0.1 % ophthalmic solution  2 times daily        12/17/23 2134    oxyCODONE -acetaminophen  (PERCOCET/ROXICET) 5-325 MG tablet  Every 6 hours PRN        12/17/23 2134               Odell Balls, PA-C 12/17/23 2137    Kammerer, Megan L, DO 12/18/23 1918

## 2023-12-17 NOTE — ED Notes (Signed)
Pt currently unable to provide urine sample.
